# Patient Record
Sex: Female | Born: 1950 | Race: White | Hispanic: No | Marital: Married | Smoking: Former smoker
Health system: Southern US, Community
[De-identification: ages and names within clinical notes are randomized; demographics above are authoritative.]

## PROBLEM LIST (undated history)

## (undated) DIAGNOSIS — E119 Type 2 diabetes mellitus without complications: Secondary | ICD-10-CM

## (undated) DIAGNOSIS — M545 Low back pain, unspecified: Secondary | ICD-10-CM

## (undated) DIAGNOSIS — M1990 Unspecified osteoarthritis, unspecified site: Secondary | ICD-10-CM

## (undated) DIAGNOSIS — I639 Cerebral infarction, unspecified: Secondary | ICD-10-CM

## (undated) DIAGNOSIS — K219 Gastro-esophageal reflux disease without esophagitis: Secondary | ICD-10-CM

## (undated) DIAGNOSIS — F419 Anxiety disorder, unspecified: Secondary | ICD-10-CM

## (undated) DIAGNOSIS — Z9981 Dependence on supplemental oxygen: Secondary | ICD-10-CM

## (undated) DIAGNOSIS — I509 Heart failure, unspecified: Secondary | ICD-10-CM

## (undated) DIAGNOSIS — E785 Hyperlipidemia, unspecified: Secondary | ICD-10-CM

## (undated) DIAGNOSIS — I739 Peripheral vascular disease, unspecified: Secondary | ICD-10-CM

## (undated) DIAGNOSIS — G4730 Sleep apnea, unspecified: Secondary | ICD-10-CM

## (undated) DIAGNOSIS — D649 Anemia, unspecified: Secondary | ICD-10-CM

## (undated) DIAGNOSIS — J449 Chronic obstructive pulmonary disease, unspecified: Secondary | ICD-10-CM

## (undated) DIAGNOSIS — I10 Essential (primary) hypertension: Secondary | ICD-10-CM

## (undated) DIAGNOSIS — R0902 Hypoxemia: Secondary | ICD-10-CM

## (undated) DIAGNOSIS — Z8679 Personal history of other diseases of the circulatory system: Secondary | ICD-10-CM

## (undated) DIAGNOSIS — J9690 Respiratory failure, unspecified, unspecified whether with hypoxia or hypercapnia: Secondary | ICD-10-CM

## (undated) DIAGNOSIS — B029 Zoster without complications: Secondary | ICD-10-CM

## (undated) DIAGNOSIS — G8929 Other chronic pain: Secondary | ICD-10-CM

## (undated) HISTORY — PX: BACK SURGERY: SHX140

## (undated) HISTORY — DX: Zoster without complications: B02.9

## (undated) HISTORY — PX: LAPAROSCOPIC CHOLECYSTECTOMY: SUR755

## (undated) HISTORY — PX: FOOT SURGERY: SHX648

## (undated) HISTORY — PX: CARDIAC CATHETERIZATION: SHX172

## (undated) HISTORY — PX: POSTERIOR LUMBAR FUSION: SHX6036

---

## 1998-05-25 ENCOUNTER — Encounter: Payer: Self-pay | Admitting: Neurosurgery

## 1998-05-25 ENCOUNTER — Ambulatory Visit (HOSPITAL_COMMUNITY): Admission: RE | Admit: 1998-05-25 | Discharge: 1998-05-25 | Payer: Self-pay | Admitting: Neurosurgery

## 1998-07-11 ENCOUNTER — Encounter: Admission: RE | Admit: 1998-07-11 | Discharge: 1998-10-09 | Payer: Self-pay | Admitting: Anesthesiology

## 1998-10-12 ENCOUNTER — Encounter: Admission: RE | Admit: 1998-10-12 | Discharge: 1999-01-10 | Payer: Self-pay | Admitting: Anesthesiology

## 1998-12-07 ENCOUNTER — Encounter: Admission: RE | Admit: 1998-12-07 | Discharge: 1999-03-07 | Payer: Self-pay | Admitting: Anesthesiology

## 1999-02-01 ENCOUNTER — Encounter: Admission: RE | Admit: 1999-02-01 | Discharge: 1999-04-16 | Payer: Self-pay | Admitting: Anesthesiology

## 1999-04-17 ENCOUNTER — Encounter: Admission: RE | Admit: 1999-04-17 | Discharge: 1999-07-16 | Payer: Self-pay | Admitting: Anesthesiology

## 1999-07-20 ENCOUNTER — Encounter: Admission: RE | Admit: 1999-07-20 | Discharge: 1999-10-10 | Payer: Self-pay | Admitting: Anesthesiology

## 1999-09-12 ENCOUNTER — Encounter: Payer: Self-pay | Admitting: Anesthesiology

## 1999-09-12 ENCOUNTER — Ambulatory Visit (HOSPITAL_COMMUNITY): Admission: RE | Admit: 1999-09-12 | Discharge: 1999-09-12 | Payer: Self-pay | Admitting: Anesthesiology

## 1999-10-10 ENCOUNTER — Encounter: Admission: RE | Admit: 1999-10-10 | Discharge: 1999-12-19 | Payer: Self-pay | Admitting: Anesthesiology

## 2000-01-11 ENCOUNTER — Encounter: Admission: RE | Admit: 2000-01-11 | Discharge: 2000-03-31 | Payer: Self-pay | Admitting: Anesthesiology

## 2000-04-04 ENCOUNTER — Encounter: Admission: RE | Admit: 2000-04-04 | Discharge: 2000-07-03 | Payer: Self-pay | Admitting: Anesthesiology

## 2000-07-07 ENCOUNTER — Encounter: Admission: RE | Admit: 2000-07-07 | Discharge: 2000-10-05 | Payer: Self-pay | Admitting: Anesthesiology

## 2000-10-10 ENCOUNTER — Encounter: Admission: RE | Admit: 2000-10-10 | Discharge: 2001-01-08 | Payer: Self-pay | Admitting: Anesthesiology

## 2001-01-15 ENCOUNTER — Encounter: Payer: Self-pay | Admitting: Internal Medicine

## 2001-01-15 ENCOUNTER — Emergency Department (HOSPITAL_COMMUNITY): Admission: EM | Admit: 2001-01-15 | Discharge: 2001-01-15 | Payer: Self-pay | Admitting: Internal Medicine

## 2001-01-16 ENCOUNTER — Emergency Department (HOSPITAL_COMMUNITY): Admission: EM | Admit: 2001-01-16 | Discharge: 2001-01-17 | Payer: Self-pay | Admitting: *Deleted

## 2001-02-04 ENCOUNTER — Encounter: Admission: RE | Admit: 2001-02-04 | Discharge: 2001-05-05 | Payer: Self-pay | Admitting: Anesthesiology

## 2001-06-15 ENCOUNTER — Encounter: Payer: Self-pay | Admitting: Internal Medicine

## 2001-06-15 ENCOUNTER — Ambulatory Visit (HOSPITAL_COMMUNITY): Admission: RE | Admit: 2001-06-15 | Discharge: 2001-06-15 | Payer: Self-pay | Admitting: Internal Medicine

## 2001-12-12 ENCOUNTER — Emergency Department (HOSPITAL_COMMUNITY): Admission: EM | Admit: 2001-12-12 | Discharge: 2001-12-12 | Payer: Self-pay | Admitting: Emergency Medicine

## 2002-03-09 ENCOUNTER — Emergency Department (HOSPITAL_COMMUNITY): Admission: EM | Admit: 2002-03-09 | Discharge: 2002-03-09 | Payer: Self-pay | Admitting: *Deleted

## 2002-04-11 ENCOUNTER — Inpatient Hospital Stay (HOSPITAL_COMMUNITY): Admission: EM | Admit: 2002-04-11 | Discharge: 2002-04-12 | Payer: Self-pay | Admitting: Internal Medicine

## 2002-04-11 ENCOUNTER — Encounter: Payer: Self-pay | Admitting: Internal Medicine

## 2002-08-04 ENCOUNTER — Emergency Department (HOSPITAL_COMMUNITY): Admission: EM | Admit: 2002-08-04 | Discharge: 2002-08-04 | Payer: Self-pay | Admitting: Emergency Medicine

## 2002-08-24 ENCOUNTER — Ambulatory Visit (HOSPITAL_COMMUNITY): Admission: RE | Admit: 2002-08-24 | Discharge: 2002-08-24 | Payer: Self-pay | Admitting: Family Medicine

## 2002-08-24 ENCOUNTER — Encounter: Payer: Self-pay | Admitting: Family Medicine

## 2002-09-17 ENCOUNTER — Emergency Department (HOSPITAL_COMMUNITY): Admission: EM | Admit: 2002-09-17 | Discharge: 2002-09-17 | Payer: Self-pay | Admitting: Emergency Medicine

## 2002-09-17 ENCOUNTER — Encounter: Payer: Self-pay | Admitting: Emergency Medicine

## 2002-09-19 ENCOUNTER — Encounter: Payer: Self-pay | Admitting: *Deleted

## 2002-09-20 ENCOUNTER — Inpatient Hospital Stay (HOSPITAL_COMMUNITY): Admission: EM | Admit: 2002-09-20 | Discharge: 2002-09-24 | Payer: Self-pay | Admitting: *Deleted

## 2002-12-29 ENCOUNTER — Emergency Department (HOSPITAL_COMMUNITY): Admission: EM | Admit: 2002-12-29 | Discharge: 2002-12-29 | Payer: Self-pay | Admitting: Emergency Medicine

## 2003-01-07 ENCOUNTER — Ambulatory Visit (HOSPITAL_COMMUNITY): Admission: RE | Admit: 2003-01-07 | Discharge: 2003-01-07 | Payer: Self-pay | Admitting: Internal Medicine

## 2003-01-07 ENCOUNTER — Encounter: Payer: Self-pay | Admitting: Internal Medicine

## 2003-01-27 ENCOUNTER — Emergency Department (HOSPITAL_COMMUNITY): Admission: EM | Admit: 2003-01-27 | Discharge: 2003-01-27 | Payer: Self-pay | Admitting: Emergency Medicine

## 2003-04-04 ENCOUNTER — Inpatient Hospital Stay (HOSPITAL_COMMUNITY): Admission: EM | Admit: 2003-04-04 | Discharge: 2003-04-06 | Payer: Self-pay | Admitting: *Deleted

## 2003-04-04 ENCOUNTER — Encounter: Payer: Self-pay | Admitting: *Deleted

## 2003-05-06 ENCOUNTER — Ambulatory Visit (HOSPITAL_COMMUNITY): Admission: RE | Admit: 2003-05-06 | Discharge: 2003-05-06 | Payer: Self-pay | Admitting: Family Medicine

## 2003-05-06 ENCOUNTER — Encounter: Payer: Self-pay | Admitting: Family Medicine

## 2003-08-17 ENCOUNTER — Ambulatory Visit (HOSPITAL_COMMUNITY): Admission: RE | Admit: 2003-08-17 | Discharge: 2003-08-17 | Payer: Self-pay | Admitting: Internal Medicine

## 2003-08-22 ENCOUNTER — Ambulatory Visit (HOSPITAL_COMMUNITY): Admission: RE | Admit: 2003-08-22 | Discharge: 2003-08-22 | Payer: Self-pay | Admitting: Family Medicine

## 2003-10-04 ENCOUNTER — Ambulatory Visit (HOSPITAL_COMMUNITY): Admission: RE | Admit: 2003-10-04 | Discharge: 2003-10-04 | Payer: Self-pay | Admitting: Internal Medicine

## 2003-10-07 ENCOUNTER — Emergency Department (HOSPITAL_COMMUNITY): Admission: EM | Admit: 2003-10-07 | Discharge: 2003-10-07 | Payer: Self-pay | Admitting: Emergency Medicine

## 2003-11-01 ENCOUNTER — Ambulatory Visit (HOSPITAL_COMMUNITY): Admission: RE | Admit: 2003-11-01 | Discharge: 2003-11-01 | Payer: Self-pay | Admitting: Internal Medicine

## 2003-11-16 ENCOUNTER — Emergency Department (HOSPITAL_COMMUNITY): Admission: EM | Admit: 2003-11-16 | Discharge: 2003-11-16 | Payer: Self-pay | Admitting: Emergency Medicine

## 2003-12-04 ENCOUNTER — Emergency Department (HOSPITAL_COMMUNITY): Admission: EM | Admit: 2003-12-04 | Discharge: 2003-12-04 | Payer: Self-pay | Admitting: Emergency Medicine

## 2004-02-20 ENCOUNTER — Emergency Department (HOSPITAL_COMMUNITY): Admission: EM | Admit: 2004-02-20 | Discharge: 2004-02-20 | Payer: Self-pay | Admitting: Emergency Medicine

## 2004-03-28 ENCOUNTER — Emergency Department (HOSPITAL_COMMUNITY): Admission: EM | Admit: 2004-03-28 | Discharge: 2004-03-29 | Payer: Self-pay | Admitting: Emergency Medicine

## 2004-03-30 ENCOUNTER — Ambulatory Visit (HOSPITAL_COMMUNITY): Admission: RE | Admit: 2004-03-30 | Discharge: 2004-03-30 | Payer: Self-pay | Admitting: Family Medicine

## 2004-05-08 ENCOUNTER — Inpatient Hospital Stay (HOSPITAL_COMMUNITY): Admission: AD | Admit: 2004-05-08 | Discharge: 2004-05-10 | Payer: Self-pay | Admitting: Internal Medicine

## 2004-10-10 ENCOUNTER — Ambulatory Visit (HOSPITAL_COMMUNITY): Admission: RE | Admit: 2004-10-10 | Discharge: 2004-10-10 | Payer: Self-pay | Admitting: General Surgery

## 2005-01-20 ENCOUNTER — Emergency Department (HOSPITAL_COMMUNITY): Admission: EM | Admit: 2005-01-20 | Discharge: 2005-01-20 | Payer: Self-pay | Admitting: Emergency Medicine

## 2005-02-12 ENCOUNTER — Emergency Department (HOSPITAL_COMMUNITY): Admission: EM | Admit: 2005-02-12 | Discharge: 2005-02-12 | Payer: Self-pay | Admitting: Emergency Medicine

## 2005-07-05 ENCOUNTER — Ambulatory Visit (HOSPITAL_COMMUNITY): Admission: RE | Admit: 2005-07-05 | Discharge: 2005-07-05 | Payer: Self-pay | Admitting: General Surgery

## 2005-08-19 ENCOUNTER — Emergency Department (HOSPITAL_COMMUNITY): Admission: EM | Admit: 2005-08-19 | Discharge: 2005-08-19 | Payer: Self-pay | Admitting: Emergency Medicine

## 2005-12-18 ENCOUNTER — Ambulatory Visit (HOSPITAL_COMMUNITY): Admission: RE | Admit: 2005-12-18 | Discharge: 2005-12-18 | Payer: Self-pay | Admitting: Family Medicine

## 2006-01-08 ENCOUNTER — Ambulatory Visit: Payer: Self-pay | Admitting: Orthopedic Surgery

## 2006-01-13 ENCOUNTER — Ambulatory Visit (HOSPITAL_COMMUNITY): Admission: RE | Admit: 2006-01-13 | Discharge: 2006-01-13 | Payer: Self-pay | Admitting: Orthopedic Surgery

## 2006-01-20 ENCOUNTER — Ambulatory Visit: Payer: Self-pay | Admitting: Orthopedic Surgery

## 2006-01-22 ENCOUNTER — Encounter (HOSPITAL_COMMUNITY): Admission: RE | Admit: 2006-01-22 | Discharge: 2006-02-21 | Payer: Self-pay | Admitting: Orthopedic Surgery

## 2006-02-05 ENCOUNTER — Ambulatory Visit: Payer: Self-pay | Admitting: Orthopedic Surgery

## 2006-02-14 HISTORY — PX: SHOULDER OPEN ROTATOR CUFF REPAIR: SHX2407

## 2006-03-04 ENCOUNTER — Ambulatory Visit (HOSPITAL_COMMUNITY): Admission: RE | Admit: 2006-03-04 | Discharge: 2006-03-04 | Payer: Self-pay | Admitting: Orthopedic Surgery

## 2006-03-04 ENCOUNTER — Encounter (INDEPENDENT_AMBULATORY_CARE_PROVIDER_SITE_OTHER): Payer: Self-pay | Admitting: *Deleted

## 2006-03-04 ENCOUNTER — Ambulatory Visit: Payer: Self-pay | Admitting: Orthopedic Surgery

## 2006-03-07 ENCOUNTER — Encounter (HOSPITAL_COMMUNITY): Admission: RE | Admit: 2006-03-07 | Discharge: 2006-04-06 | Payer: Self-pay | Admitting: Orthopedic Surgery

## 2006-03-10 ENCOUNTER — Ambulatory Visit: Payer: Self-pay | Admitting: Orthopedic Surgery

## 2006-03-17 ENCOUNTER — Ambulatory Visit: Payer: Self-pay | Admitting: Orthopedic Surgery

## 2006-04-07 ENCOUNTER — Encounter (HOSPITAL_COMMUNITY): Admission: RE | Admit: 2006-04-07 | Discharge: 2006-05-07 | Payer: Self-pay | Admitting: Orthopedic Surgery

## 2006-04-10 ENCOUNTER — Ambulatory Visit: Payer: Self-pay | Admitting: Orthopedic Surgery

## 2006-05-21 ENCOUNTER — Ambulatory Visit: Payer: Self-pay | Admitting: Orthopedic Surgery

## 2006-06-18 ENCOUNTER — Ambulatory Visit: Payer: Self-pay | Admitting: Orthopedic Surgery

## 2006-12-08 ENCOUNTER — Emergency Department (HOSPITAL_COMMUNITY): Admission: EM | Admit: 2006-12-08 | Discharge: 2006-12-08 | Payer: Self-pay | Admitting: Emergency Medicine

## 2006-12-09 ENCOUNTER — Emergency Department (HOSPITAL_COMMUNITY): Admission: EM | Admit: 2006-12-09 | Discharge: 2006-12-09 | Payer: Self-pay | Admitting: Emergency Medicine

## 2007-01-22 ENCOUNTER — Ambulatory Visit (HOSPITAL_COMMUNITY): Admission: RE | Admit: 2007-01-22 | Discharge: 2007-01-22 | Payer: Self-pay | Admitting: Family Medicine

## 2007-03-14 ENCOUNTER — Emergency Department (HOSPITAL_COMMUNITY): Admission: EM | Admit: 2007-03-14 | Discharge: 2007-03-14 | Payer: Self-pay | Admitting: Emergency Medicine

## 2007-07-21 ENCOUNTER — Ambulatory Visit (HOSPITAL_COMMUNITY): Admission: RE | Admit: 2007-07-21 | Discharge: 2007-07-21 | Payer: Self-pay | Admitting: Family Medicine

## 2007-07-25 ENCOUNTER — Emergency Department (HOSPITAL_COMMUNITY): Admission: EM | Admit: 2007-07-25 | Discharge: 2007-07-26 | Payer: Self-pay | Admitting: Emergency Medicine

## 2008-02-16 ENCOUNTER — Ambulatory Visit (HOSPITAL_COMMUNITY): Admission: RE | Admit: 2008-02-16 | Discharge: 2008-02-16 | Payer: Self-pay | Admitting: Family Medicine

## 2008-03-16 HISTORY — PX: CAROTID ENDARTERECTOMY: SUR193

## 2008-03-24 ENCOUNTER — Emergency Department (HOSPITAL_COMMUNITY): Admission: EM | Admit: 2008-03-24 | Discharge: 2008-03-24 | Payer: Self-pay | Admitting: Emergency Medicine

## 2008-04-11 ENCOUNTER — Encounter: Payer: Self-pay | Admitting: Emergency Medicine

## 2008-04-12 ENCOUNTER — Encounter (INDEPENDENT_AMBULATORY_CARE_PROVIDER_SITE_OTHER): Payer: Self-pay | Admitting: Neurology

## 2008-04-12 ENCOUNTER — Inpatient Hospital Stay (HOSPITAL_COMMUNITY): Admission: EM | Admit: 2008-04-12 | Discharge: 2008-04-16 | Payer: Self-pay | Admitting: Neurology

## 2008-04-12 ENCOUNTER — Ambulatory Visit: Payer: Self-pay | Admitting: Internal Medicine

## 2008-04-13 ENCOUNTER — Encounter (INDEPENDENT_AMBULATORY_CARE_PROVIDER_SITE_OTHER): Payer: Self-pay | Admitting: Neurology

## 2008-04-13 ENCOUNTER — Ambulatory Visit: Payer: Self-pay | Admitting: Vascular Surgery

## 2008-04-15 ENCOUNTER — Encounter: Payer: Self-pay | Admitting: Vascular Surgery

## 2008-04-26 ENCOUNTER — Ambulatory Visit: Payer: Self-pay | Admitting: Vascular Surgery

## 2008-05-29 ENCOUNTER — Emergency Department (HOSPITAL_COMMUNITY): Admission: EM | Admit: 2008-05-29 | Discharge: 2008-05-30 | Payer: Self-pay | Admitting: Emergency Medicine

## 2008-09-16 HISTORY — PX: CAROTID ENDARTERECTOMY: SUR193

## 2008-10-25 ENCOUNTER — Ambulatory Visit: Payer: Self-pay | Admitting: Vascular Surgery

## 2008-11-11 ENCOUNTER — Ambulatory Visit: Payer: Self-pay | Admitting: Vascular Surgery

## 2008-11-11 ENCOUNTER — Encounter: Payer: Self-pay | Admitting: Vascular Surgery

## 2008-11-11 ENCOUNTER — Inpatient Hospital Stay (HOSPITAL_COMMUNITY): Admission: RE | Admit: 2008-11-11 | Discharge: 2008-11-12 | Payer: Self-pay | Admitting: Vascular Surgery

## 2008-11-29 ENCOUNTER — Ambulatory Visit: Payer: Self-pay | Admitting: Vascular Surgery

## 2009-03-30 ENCOUNTER — Ambulatory Visit (HOSPITAL_COMMUNITY): Admission: RE | Admit: 2009-03-30 | Discharge: 2009-03-30 | Payer: Self-pay | Admitting: Family Medicine

## 2010-01-08 IMAGING — CR DG LUMBAR SPINE COMPLETE 4+V
5 series · 5 of 5 positions shown · non-contrast
Comparison: MRI 07/21/2007

CLINICAL DATA: Fall, back pain

LUMBAR SPINE - COMPLETE 4+ VIEW

[view not recorded (1 of 5)]
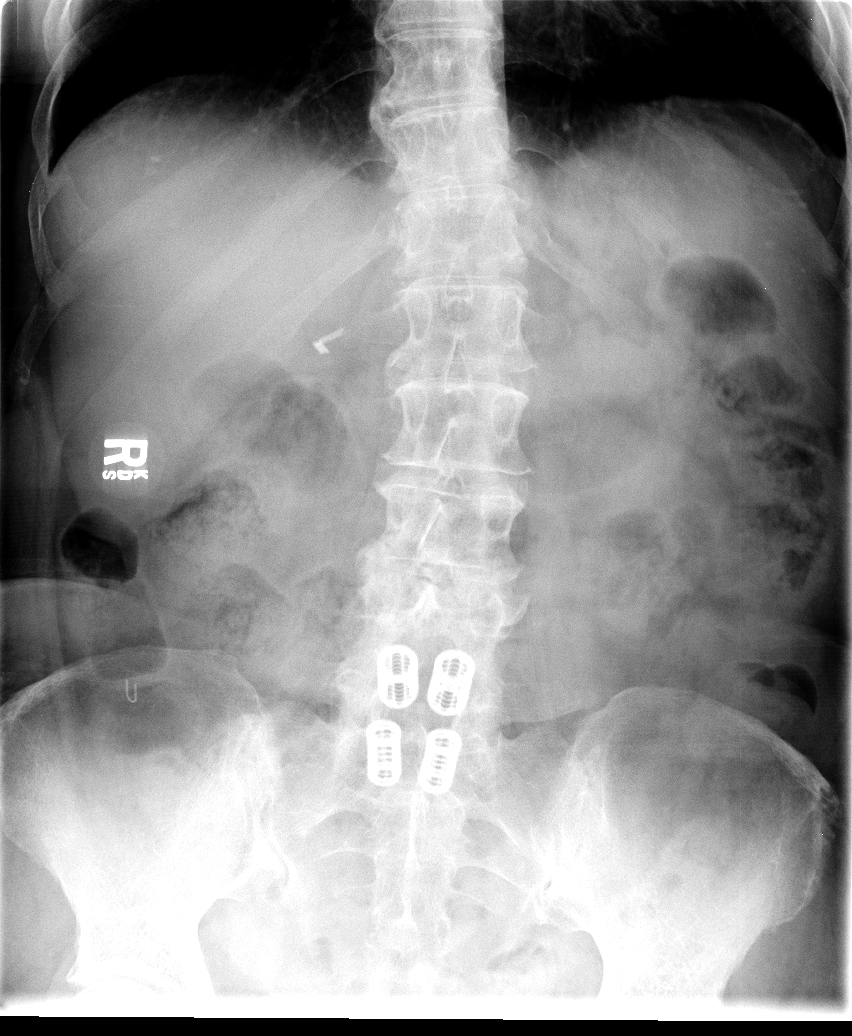

[view not recorded (2 of 5)]
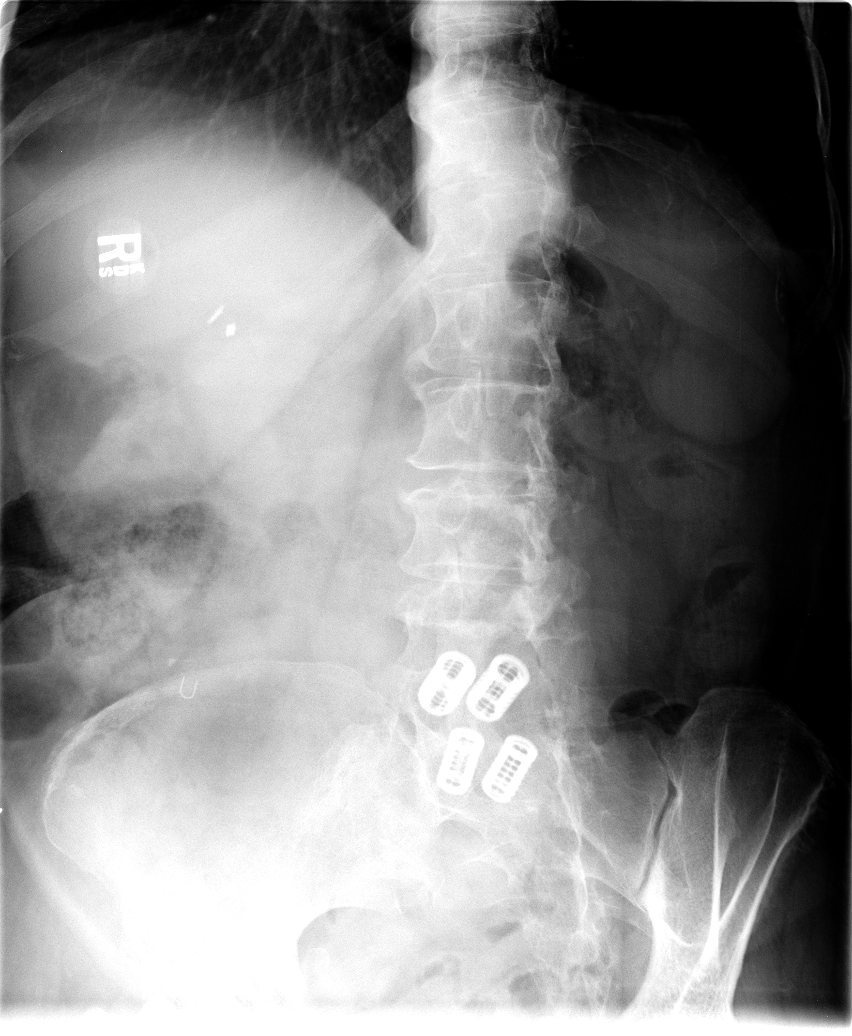

[view not recorded (3 of 5)]
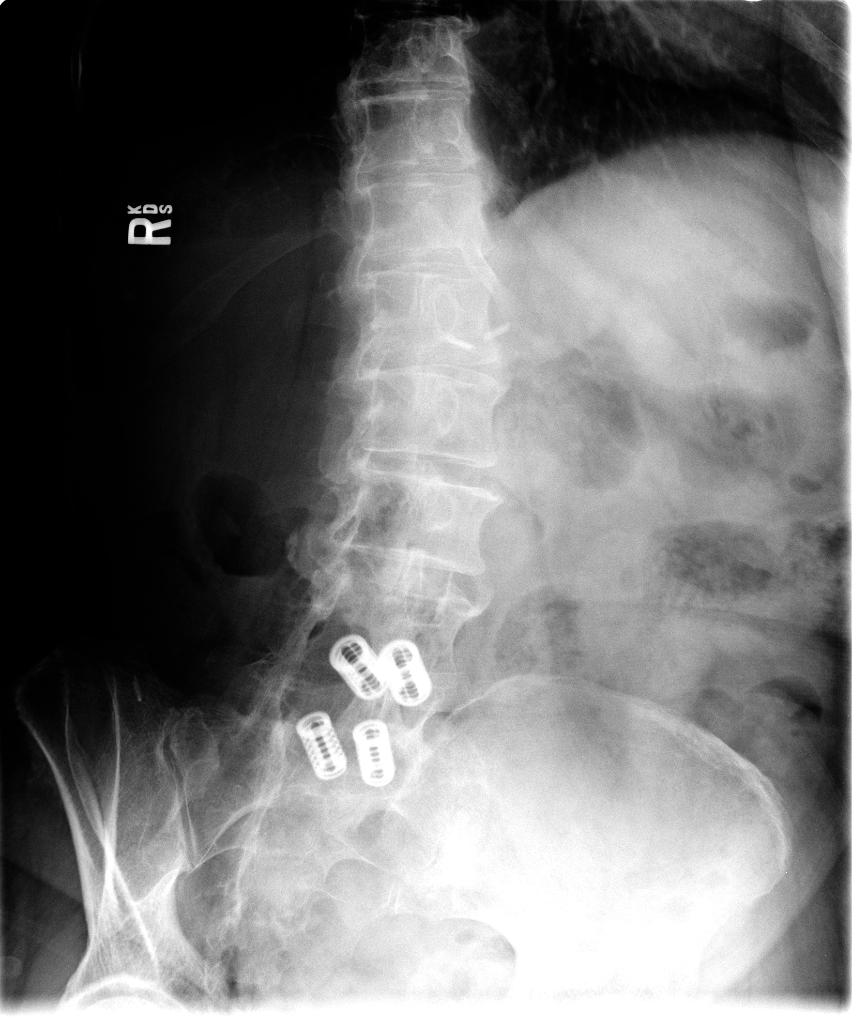

[view not recorded (4 of 5)]
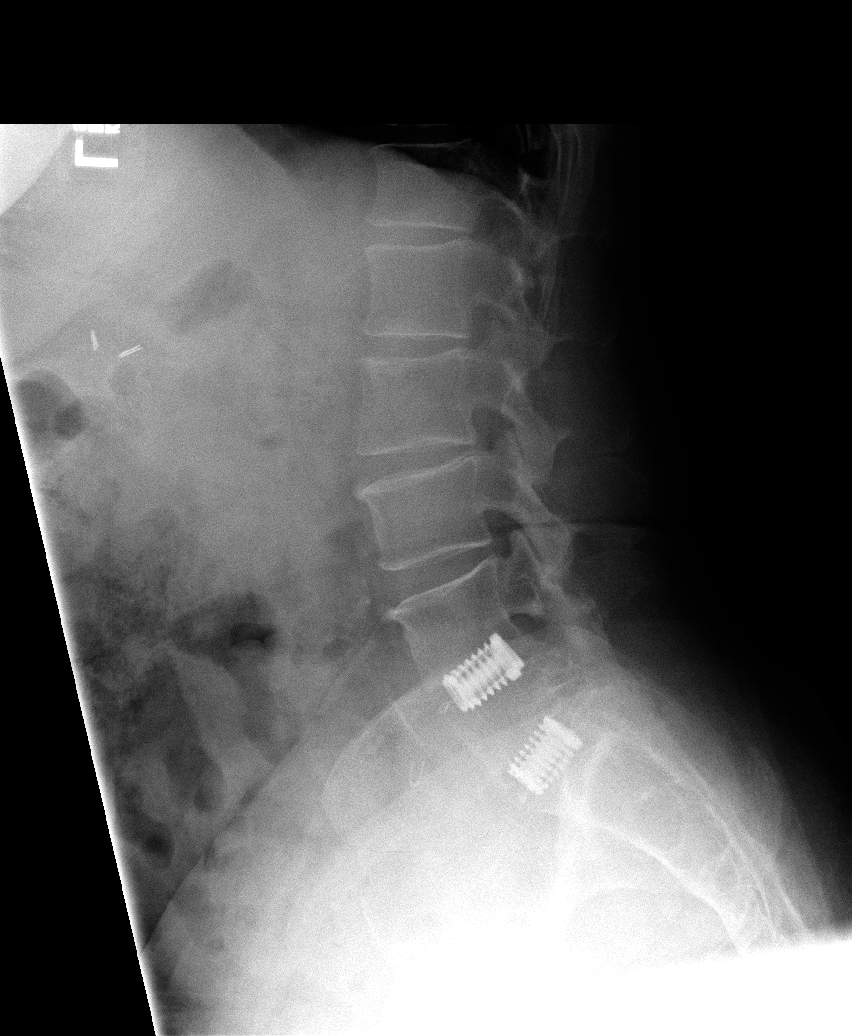

[view not recorded (5 of 5)]
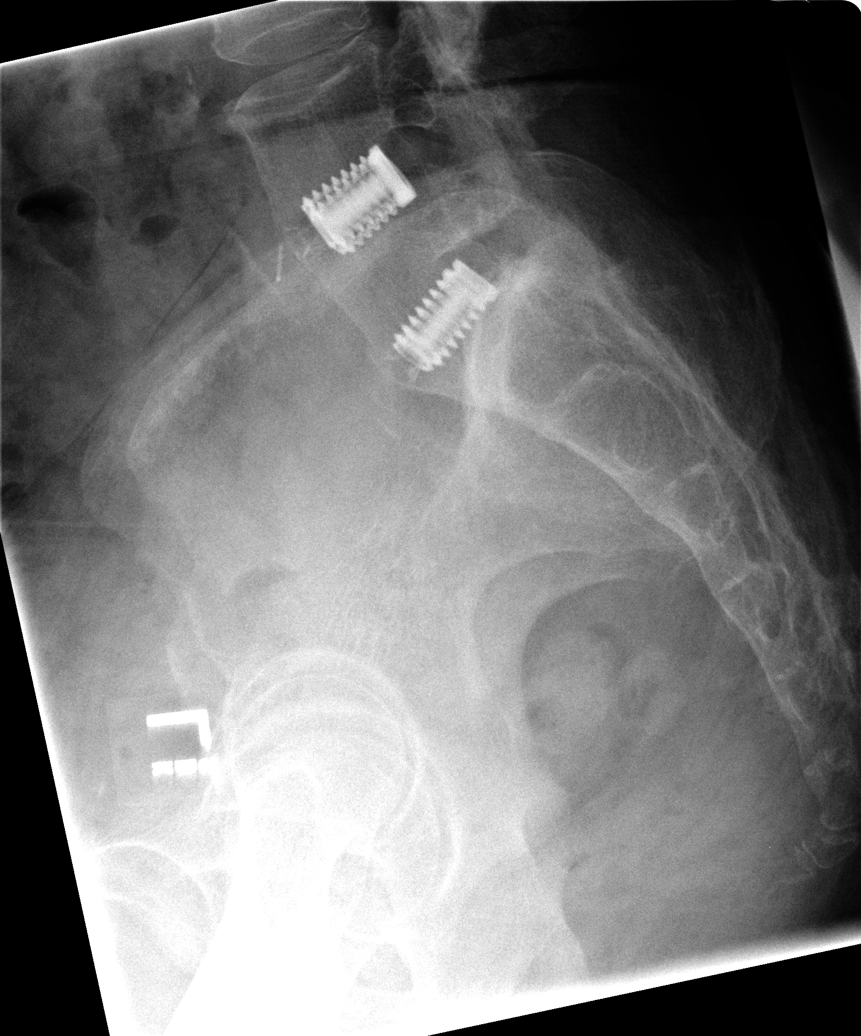

[5 of 5 positions shown; findings below may reference images not displayed]

FINDINGS: The patient is status post discectomy at L4-5 and L5, S1.
There are five lumbar-type vertebral bodies.  Minimal leftward
scoliosis.  Mild degenerative changes.  No fracture or acute
malalignment.
IMPRESSION: Prior to old discectomy.

No acute findings.

## 2010-08-10 ENCOUNTER — Emergency Department (HOSPITAL_COMMUNITY): Admission: EM | Admit: 2010-08-10 | Discharge: 2010-08-10 | Payer: Self-pay | Admitting: Emergency Medicine

## 2010-09-03 ENCOUNTER — Ambulatory Visit (HOSPITAL_COMMUNITY)
Admission: RE | Admit: 2010-09-03 | Discharge: 2010-09-03 | Payer: Self-pay | Source: Home / Self Care | Attending: Family Medicine | Admitting: Family Medicine

## 2010-10-06 ENCOUNTER — Encounter: Payer: Self-pay | Admitting: Internal Medicine

## 2010-11-27 LAB — BASIC METABOLIC PANEL
CO2: 31 mEq/L (ref 19–32)
Chloride: 96 mEq/L (ref 96–112)
Creatinine, Ser: 0.85 mg/dL (ref 0.4–1.2)
GFR calc Af Amer: >60 mL/min (ref >60)
Potassium: 4.2 mEq/L (ref 3.5–5.1)
Sodium: 133 mEq/L — ABNORMAL LOW (ref 135–145)

## 2010-11-27 LAB — CBC
HCT: 48.3 % — ABNORMAL HIGH (ref 36.0–46.0)
Hemoglobin: 17.1 g/dL — ABNORMAL HIGH (ref 12.0–15.0)
MCH: 31.9 pg (ref 26.0–34.0)
MCV: 90.1 fL (ref 78.0–100.0)
RBC: 5.36 MIL/uL — ABNORMAL HIGH (ref 3.87–5.11)

## 2010-11-27 LAB — POCT CARDIAC MARKERS: Troponin i, poc: <0.05 ng/mL (ref 0.00–0.09)

## 2010-11-27 LAB — DIFFERENTIAL
Eosinophils Absolute: 0.1 10*3/uL (ref 0.0–0.7)
Eosinophils Relative: 1 % (ref 0–5)
Lymphs Abs: 2.1 10*3/uL (ref 0.7–4.0)
Monocytes Absolute: 0.7 10*3/uL (ref 0.1–1.0)
Monocytes Relative: 7 % (ref 3–12)

## 2011-01-01 LAB — BASIC METABOLIC PANEL
BUN: 5 mg/dL — ABNORMAL LOW (ref 6–23)
CO2: 30 mEq/L (ref 19–32)
Calcium: 8.5 mg/dL (ref 8.4–10.5)
Glucose, Bld: 166 mg/dL — ABNORMAL HIGH (ref 70–99)
Potassium: 3.6 mEq/L (ref 3.5–5.1)
Sodium: 130 mEq/L — ABNORMAL LOW (ref 135–145)

## 2011-01-01 LAB — URINALYSIS, ROUTINE W REFLEX MICROSCOPIC
Bilirubin Urine: NEGATIVE
Hgb urine dipstick: NEGATIVE
Ketones, ur: NEGATIVE mg/dL
Nitrite: NEGATIVE
Specific Gravity, Urine: 1.016 (ref 1.005–1.030)
pH: 5.5 (ref 5.0–8.0)

## 2011-01-01 LAB — CBC
HCT: 40.7 % (ref 36.0–46.0)
HCT: 46 % (ref 36.0–46.0)
Hemoglobin: 14.1 g/dL (ref 12.0–15.0)
MCHC: 34.7 g/dL (ref 30.0–36.0)
MCV: 88.6 fL (ref 78.0–100.0)
Platelets: 202 10*3/uL (ref 150–400)
RBC: 5.2 MIL/uL — ABNORMAL HIGH (ref 3.87–5.11)
RDW: 13.8 % (ref 11.5–15.5)
WBC: 8.7 10*3/uL (ref 4.0–10.5)

## 2011-01-01 LAB — CROSSMATCH: Antibody Screen: NEGATIVE

## 2011-01-01 LAB — COMPREHENSIVE METABOLIC PANEL
AST: 19 U/L (ref 0–37)
Alkaline Phosphatase: 75 U/L (ref 39–117)
BUN: 9 mg/dL (ref 6–23)
CO2: 30 mEq/L (ref 19–32)
Chloride: 97 mEq/L (ref 96–112)
Creatinine, Ser: 0.93 mg/dL (ref 0.4–1.2)
GFR calc non Af Amer: >60 mL/min (ref >60)
Potassium: 4.3 mEq/L (ref 3.5–5.1)
Total Bilirubin: 0.5 mg/dL (ref 0.3–1.2)

## 2011-01-01 LAB — GLUCOSE, CAPILLARY
Glucose-Capillary: 203 mg/dL — ABNORMAL HIGH (ref 70–99)
Glucose-Capillary: 207 mg/dL — ABNORMAL HIGH (ref 70–99)
Glucose-Capillary: 210 mg/dL — ABNORMAL HIGH (ref 70–99)
Glucose-Capillary: 217 mg/dL — ABNORMAL HIGH (ref 70–99)

## 2011-01-01 LAB — HEMOGLOBIN A1C: Mean Plasma Glucose: 171 mg/dL

## 2011-01-01 LAB — APTT: aPTT: 30 seconds (ref 24–37)

## 2011-01-29 NOTE — Assessment & Plan Note (Signed)
OFFICE VISIT   [name], [name]  DOB:  September 22, 1950                                       04/26/2008  CHART#:[id number]   I saw the patient in the office today for followup.  She had a left  carotid endarterectomy by Dr. Hart Rochester on 04/15/2008 for a symptomatic  left carotid stenosis.  She comes in for routine followup visit.  She  has had some mild headaches which have been stable.  She had no focal  weakness or paresthesias.   PHYSICAL EXAMINATION:  Blood pressure is 108/67, heart rate is 84.  Her  incision is healing nicely.  She has some mild swelling.  Neurologic  exam is nonfocal.   Overall I am pleased with her progress and will have her follow up with  Dr. Hart Rochester in 6 months with a followup carotid duplex scan.  She knows  to call sooner if her headaches do not resolve.  I have instructed her  that it is safe to take Tylenol for headaches as needed.   Di Kindle. Edilia Bo, M.D.  Electronically Signed   CSD/MEDQ  D:  04/26/2008  T:  04/27/2008  Job:  [id number]

## 2011-01-29 NOTE — H&P (Signed)
HISTORY AND PHYSICAL EXAMINATION   October 25, 2008   Re:  [name], DIMAANO                  DOB:  30-Dec-1950   CHIEF COMPLAINT:  Severe right internal carotid stenosis with possible  right brain TIAs.   HISTORY OF PRESENT ILLNESS:  This 60 year old female patient found to  have severe carotid occlusive disease, following a small left brain  stroke in 2009.  She underwent left carotid endarterectomy and at that  time had a moderate right internal carotid stenosis.  She had complete  recovery from her stroke fairly rapidly.  She has had a few episodes  where she describes a drawing of the left side of her mouth which  occurred over the last several months but no hemiparesis, aphasia,  amaurosis fugax or other discrete symptoms.  Repeat carotid duplex exam  in [facility] office on October 25, 2008 revealed significant progression of  disease with now an 85-90% right internal carotid stenosis and widely  patent left carotid endarterectomy site.  She is now scheduled for  elective surgery on the right side.   PAST MEDICAL HISTORY:  1. Diabetes mellitus insulin dependent.  2. Hypertension.  3. COPD.  4. History of mild coronary artery disease several years ago.  No      symptoms at this time.  5. A previous left brain CVA.   ALLERGIES:  CIPRO.   PAST SURGICAL HISTORY:  1. Cholecystectomy.  2. Lumbar laminectomy.  3. Left carotid endarterectomy.   MEDICATIONS:  1. Metformin 500 mg b.i.d.  2. Januvia 100 mg daily.  3. Lyrica 75 mg daily.  4. Diovan hydrochlorothiazide 160/12.5 one daily.  5. Alprazolam 0.25 mg one q.i.d.  6. Aspirin 81 mg daily.   REVIEW OF SYSTEMS:  Denies any chest pain, dyspnea on exertion, PND,  orthopnea or pulmonary problems at this time.  Does have a history of  home oxygen in the past but has not used it for 6 months.   PHYSICAL EXAM:  VITAL SIGNS:  Blood pressure is 123/70, heart rate 84,  respirations 14.  GENERAL:  She is a middle-aged  female in no apparent distress, alert and  oriented x3.  NECK:  Supple, 3+ carotid pulses palpable.  There is a harsh bruit on  the right side.  No bruit on the left.  NEUROLOGY:  Normal.  No palpable adenopathy in the neck.  She is  edentulous.  CHEST:  Clear to auscultation.  CARDIOVASCULAR:  Regular rhythm.  No murmurs.  ABDOMEN:  Soft, nontender with no masses.  EXTREMITIES:  She has 3+ femoral pulses bilaterally with well-perfused  lower extremities.   IMPRESSION:  1. Severe right internal carotid stenosis - asymptomatic.  2. Diabetes mellitus.  3. Hypertension  4. Chronic obstructive pulmonary disease.  5. History of left brain stroke.   PLAN:  To admit the patient on February 26 for elective right carotid  endarterectomy.  Risks and benefits have been thoroughly discussed with  the patient who is willing to proceed.   Quita Skye Hart Rochester, M.D.  Electronically Signed   JDL/MEDQ  D:  10/25/2008  T:  10/26/2008  Job:  [id number]

## 2011-01-29 NOTE — Op Note (Signed)
[name], [name] NO.:  [id number]   MEDICAL RECORD NO.:  [id number]          PATIENT TYPE:  INP   LOCATION:  3306                         FACILITY:  [facility]   PHYSICIAN:  Quita Skye. Hart Rochester, M.D.  DATE OF BIRTH:  22-Aug-1951   DATE OF PROCEDURE:  11/11/2008  DATE OF DISCHARGE:                               OPERATIVE REPORT   PREOPERATIVE DIAGNOSIS:  Severe right internal carotid stenosis with  history of right brain transient ischemic attack.   POSTOPERATIVE DIAGNOSIS:  Severe right internal carotid stenosis with  history of right brain transient ischemic attack.   OPERATIONS:  Right carotid endarterectomy with Dacron patch angioplasty.   SURGEON:  Quita Skye. Hart Rochester, MD   FIRST ASSISTANT:  Nurse.   ANESTHESIA:  General endotracheal.   BRIEF HISTORY:  This patient previously underwent left carotid  endarterectomy and has been followed for a moderate right internal  carotid stenosis.  This is now progressed to a severe degree (80-90%)  and the patient relates 1-2 episodes of perioral numbness on the left  side.  She is scheduled for right carotid endarterectomy.   PROCEDURE:  The patient was taken to the operating room and placed in  the supine position at which time satisfactory general endotracheal  anesthesia was administered.  Right neck was prepped with Betadine scrub  and solution and draped in a routine sterile manner.  Incision was made  along the anterior border of the sternocleidomastoid muscle and carried  down through the subcutaneous tissue and platysma using Bovie.  Common  facial vein and external jugular veins were ligated with 3-0 silk ties  and divided exposing the common internal and external carotid arteries.  Care was taken not to injure the vagus or hypoglossal nerves, both of  which were exposed.  There was calcified atherosclerotic plaque at the  carotid bifurcation extending up the internal carotid artery posteriorly  about 3-4 cm,  distal vessel appeared normal.  A #10 shunt was prepared  and the patient was heparinized.  Carotid vessels were occluded with  vascular clamps.  Longitudinal opening was made in the common carotid  with a 15 blade, extended up the internal carotid with Potts scissors to  a point distal to the disease.  Plaque was about 80-90% stenotic in  severity and slightly irregular.  Distal vessel appeared normal.  A #10  shunt was inserted without difficulty reestablishing flow in about 2  minutes.  Standard endarterectomy was then performed using elevator and  Potts scissors with an eversion endarterectomy of the external carotid.  Plaque feathered off distal internal carotid artery nicely, not  requiring any tacking sutures.  Lumen was thoroughly irrigated with  heparin saline.  All loose debris was carefully removed and arteriotomy  was closed with a patch using continuous 6-0 Prolene.  Prior to  completion of the closure, the shunt was removed after about 30 minutes  of shunt time.  Following antegrade and retrograde flushing, closure was  completed reestablishing flow initially up the external and internal  branch.  Carotid  was occluded for less than 2  minutes for removal of the shunt.  Protamine was then given to reverse the heparin.  Following adequate  hemostasis, wound was irrigated with saline, closed in layers with  Vicryl in subcuticular fashion.  Sterile dressing was applied.  The  patient was taken to the recovery in satisfactory condition.      Quita Skye Hart Rochester, M.D.  Electronically Signed     JDL/MEDQ  D:  11/11/2008  T:  11/11/2008  Job:  [id number]

## 2011-01-29 NOTE — Procedures (Signed)
CAROTID DUPLEX EXAM   INDICATION:  Follow up right carotid endarterectomy with right neck  swelling.   HISTORY:  Diabetes:  Yes.  Cardiac:  No.  Hypertension:  Yes.  Smoking:  Yes.  Previous Surgery:  Bilateral carotid endarterectomies.  CV History:  Amaurosis Fugax No, Paresthesias No, Hemiparesis No.                                       RIGHT             LEFT  Brachial systolic pressure:  Brachial Doppler waveforms:  Vertebral direction of flow:        Antegrade  DUPLEX VELOCITIES (cm/sec)  CCA peak systolic                   121  ECA peak systolic                   201  ICA peak systolic                   106  ICA end diastolic                   20  PLAQUE MORPHOLOGY:                  None  PLAQUE AMOUNT:                      None  PLAQUE LOCATION:                    ECA   IMPRESSION:  1. Limited study.  2. Normal carotid duplex noted in the right carotid in respect to flow      and velocity and also no evidence of fluid collection noted.  3. Antegrade right vertebral arteries.   ___________________________________________  Quita Skye Hart Rochester, M.D.   MG/MEDQ  D:  11/29/2008  T:  11/30/2008  Job:  [id number]

## 2011-01-29 NOTE — H&P (Signed)
NAMESONDRA, BLIXT NO.:  [id number]   MEDICAL RECORD NO.:  [id number]          PATIENT TYPE:  INP   LOCATION:  3023                         FACILITY:  [facility]   PHYSICIAN:  Levert Feinstein, MD          DATE OF BIRTH:  Dec 05, 1950   DATE OF ADMISSION:  04/12/2008  DATE OF DISCHARGE:                              HISTORY & PHYSICAL   CHIEF COMPLAINT:  Right facial and hand numbness and weakness.   HISTORY OF PRESENT ILLNESS:  The patient is a 60 year old female, with  past medical history of depression/anxiety, hypertension, and a smoker,  recent 2 months, she had intermittent right facial jaw and hand  numbness, sometimes associated with typical migraines.  Yesterday around  6:00 p.m., she began to experience headaches again, she describes  bilateral frontal, later left retro-orbital severe pounding headache  with associated nausea, photophonophobia.  She noticed right upper and  lower lip and inner mouth numbness and tingling, and she also had right  hand numb, but this time it is spreading to right forearm.  She also has  right hand weakness, the whole episode lasted much longer about 6 hours,  which has prompted ER visit.  She originally presented to the [facility], the Primary Admission Medicine Team felt that the patient need  neurology evaluation, later transferred to [facility] for further  evaluation.   The patient reported the whole episode lasted about 6 hours.  Now, she  denies a headache and she still has mild right hand numbness, but no  longer weak and her right face is no longer numb.   REVIEW OF SYSTEMS:  She denies speech difficulty, chest pain, heart  palpitation, right trunk or lag involvement.  She did have hoarse voice  and this has been a chronic problem for her.   PAST MEDICAL HISTORY:  Hypertension, depression, and anxiety.   SURGICAL HISTORY:  Low back surgery, cholecystectomy, and right elbow  surgery.   FAMILY HISTORY:   Noncontributory.   SOCIAL HISTORY:  She is disabled because of chronic low back pain.  She  smokes a pack a day.  Denied drinks and has 3 children.   ALLERGIES:  CIPRO causes rash.   PHYSICAL EXAMINATION:  VITAL SIGNS:  Temperature 97.7, heart rate of 62,  respiration of 16, and blood pressure was 104/57.  CARDIAC:  Regular rate and rhythm.  PULMONARY:  Has expiration wheezes.  NECK:  Supple.  No carotid bruits.  NEUROLOGIC:  She has a hoarse voice, but alert and oriented to history  taking and casaul conversation. Cranial nerves II-XII.  Pupil equal,  round, reactive, right fundi were sharp, visual fields were full on  confrontational test, and facial sensation strength was normal.  Uvula,  tongue midline.  Head turning and shoulder shrugging were normal  symmetric.  Tongue protrusion into cheek strength was normal.  MOTOR EXAMINATION:  Normal tone, bulk, and strength including right  upper extremity.  Sensory was normal to light touch and pinprick.  There  was no extinction on double spontaneous stimulation.  Coordination,  normal finger-to-nose, heel-to-shin  GAIT:  Narrow-based and steady.  Deep tendon reflex.  Hypoactive and  symmetric.  Plantar responses were flat.   ASSESSMENT AND PLAN:  1. This is a 60 year old female with previous history of migraine,      vascular risk factors of hypertension, and smoker presenting with      headache with associated right facial and hand numb and weakness.      The most likely etiology is a complicated migraine, but with her      vascular risk factors the other possibility would be a left      thalamic/subcortical small vessel stroke, MRI of the brain without      contrast.  2. Start antiplatelet agent.  3. Complete basic stroke lab evaluation.      Levert Feinstein, MD  Electronically Signed     YY/MEDQ  D:  04/12/2008  T:  04/12/2008  Job:  [id number]

## 2011-01-29 NOTE — Procedures (Signed)
CAROTID DUPLEX EXAM   INDICATION:  Follow up left carotid endarterectomy.   HISTORY:  Diabetes:  Yes.  Cardiac:  No.  Hypertension:  Yes.  Smoking:  Yes.  Previous Surgery:  Left carotid endarterectomy on 04/15/2008.  CV History:  Headaches, history of mild left brain CVA in 03/2008.  Amaurosis Fugax No, Paresthesias No, Hemiparesis No.                                       RIGHT              LEFT  Brachial systolic pressure:         124                126  Brachial Doppler waveforms:         Normal             Normal  Vertebral direction of flow:        Antegrade/dampened Antegrade  DUPLEX VELOCITIES (cm/sec)  CCA peak systolic                   128                84  ECA peak systolic                   197                114  ICA peak systolic                   336                110  ICA end diastolic                   121                36  PLAQUE MORPHOLOGY:                  Mixed  PLAQUE AMOUNT:                      Moderate-to-severe None  PLAQUE LOCATION:                    ICA/ECA   IMPRESSION:  1. Doppler velocities suggest an 80-99% stenosis of the right proximal      internal carotid artery.  2. Patent left carotid endarterectomy site with no evidence of      stenosis noted in the left internal carotid artery.  3. The antegrade flow of the right vertebral artery appears dampened      and resistive, which may suggest a possible distal stenosis.   ___________________________________________  [name], M.D.   CH/MEDQ  D:  10/25/2008  T:  10/25/2008  Job:  [id number]

## 2011-01-29 NOTE — Op Note (Signed)
[name], [name] NO.:  [id number]   MEDICAL RECORD NO.:  [id number]          PATIENT TYPE:   LOCATION:                                 FACILITY:   PHYSICIAN:  Quita Skye. Hart Rochester, M.D.       DATE OF BIRTH:   DATE OF PROCEDURE:  DATE OF DISCHARGE:                               OPERATIVE REPORT   PREOPERATIVE DIAGNOSIS:  Severe left internal carotid stenosis status  post left brain cerebrovascular accident.   POSTOPERATIVE DIAGNOSIS:  Severe left internal carotid stenosis status  post left brain cerebrovascular accident.   OPERATION:  Left carotid endarterectomy with Dacron patch angioplasty.   SURGEON:  Quita Skye. Hart Rochester, MD.   FIRST ASSISTANT:  1. Durene Cal IV, MD   SECOND ASSISTANT:  Jerold Coombe, PA.   ANESTHESIA:  General endotracheal.   BRIEF HISTORY:  This middle-aged female patient has suffered multiple  episodes of transient weakness in the right upper extremity and right  face over the last 6 months and suffered a mild left brain CVA 4 days  ago with findings on MRI scan compatible with this.  She has regained  her neurologic function completely having had a short episode of  weakness in the right arm and right face.  She is now scheduled for  carotid endarterectomy for severe left internal carotid stenosis.  She  also has a moderate right internal carotid stenosis.   PROCEDURE:  The patient was taken to the operating room and placed in  the supine position at which time satisfactory general endotracheal  anesthesia was administered.  The left neck was prepped with Betadine  scrub and solution and draped in a routine sterile manner.  An incision  was made along the anterior border of sternocleidomastoid muscle and  carried down through subcutaneous tissue and platysma using the Bovie.  The common facial vein and external jugular veins were ligated with 3-0  silk ties and divided exposing the common, internal, and external  carotid  arteries.  Care was taken not to injure the vagus and  hypoglossal nerves both of which were exposed.  There was a calcified  atherosclerotic plaque at the carotid bifurcation extending up to the  internal carotid artery about 3-4 cm.  Distal vessel appeared normal.  There was also some thickening of the common carotid.  A #10 shunt was  prepared, and the patient was heparinized.  The carotid vessels were  occluded with vascular clamps.  Longitudinal opening made in the common  carotid with a 15-blade, extended up to the internal carotid with a  Potts scissors to a point distal to the disease.  The plaque was at  least 80% stenotic in severity with good backbleeding from above.  A #10  shunt was inserted without difficulty reestablishing flow in about 2  minutes.  Standard endarterectomy was then performed using the elevator  and Potts scissors with eversion endarterectomy of the external carotid.  The plaque feathered off the distal internal carotid artery nicely, not  requiring any tacking sutures.  The lumen was thoroughly irrigated with  heparin and saline, and all loose debris carefully removed.  Arteriotomy  was closed with a patch using continuous 6-0 Prolene.  Prior to  completion of closure, the shunt removed after about 30 minutes shunt  time.  Following antegrade and retrograde flushing, closure was  completed reestablishing flow initially up to the external and internal  branch.  Carotid was occluded for less than 2 minutes for  removal of the shunt.  Protamine was then given to reverse the heparin.  Following adequate hemostasis, wound was irrigated with saline and  closed in layers with Vicryl in subcuticular fashion.  Sterile dressing  applied.  The patient was taken to recovery room in satisfactory  condition.      Quita Skye Hart Rochester, M.D.  Electronically Signed     JDL/MEDQ  D:  04/15/2008  T:  04/16/2008  Job:  [id number]

## 2011-01-29 NOTE — Discharge Summary (Signed)
[name], [name] NO.:  [id number]   MEDICAL RECORD NO.:  [id number]          PATIENT TYPE:  INP   LOCATION:  3304                         FACILITY:  [facility]   PHYSICIAN:  Quita Skye. Hart Rochester, M.D.  DATE OF BIRTH:  [birth year]   DATE OF ADMISSION:  04/12/2008  DATE OF DISCHARGE:  04/16/2008                               DISCHARGE SUMMARY   A patient of Dr. Hart Rochester in Stroke Service.   DISCHARGE DIAGNOSES:  1. Left carotid occlusive disease.  2. Left hemispheric cerebrovascular accident with good resolution of      symptoms.  3. Hypertension.  4. Depression.  5. Anxiety.  6. Diabetes.   PROCEDURES PERFORMED:  Left carotid endarterectomy on April 15, 2008, by  Dr. Hart Rochester.   COMPLICATIONS:  None.   CONDITION AT DISCHARGE:  Stable and improved.   DISCHARGE MEDICATIONS:  1. Diovan 160/12.5 mg p.o. daily.  2. Januvia 100 mg p.o. daily.  3. Metformin 500 mg p.o. b.i.d.  4. Xanax 0.5 mg p.o. q.i.d. p.r.n. anxiety.  5. Tylox 5/500 p.o. t.i.d.  6. Lyrica 75 mg p.o. b.i.d.  7. Lexapro 10 mg p.o. daily.  She was given a prescription for Tylox,      a total of #30 were prescribed, for her postoperative pain.   DISPOSITION:  She has been discharged to home in stable condition with  her wounds healing well.  She was instructed to clean them gently with  soap and water.  She is to call if fever greater than 101, redness,  drainage from the incision, severe headache, or new motor, speech, or  visual changes.  She is to see Dr. Hart Rochester in 2-3 weeks in the office; we  will arrange the visit.  She is also to see the Stroke Team physician in  2 months.   BRIEF IDENTIFYING STATEMENT:  For complete details, please refer to the  typed history and physical.  Briefly, this 60 year old woman was  transferred in from [facility] following presentation there  with symptoms consistent with a left hemispheric stroke.  She had right  facial and hand numbness with  weakness.   HOSPITAL COURSE:  She was admitted to the Stroke Team.  Workup revealed  a left narrowing of her carotid artery.  Dr. Hart Rochester evaluated her and  recommended left carotid endarterectomy for stroke prevention.  She was  informed of the risks and benefits of the procedure, and after careful  consideration, elected to proceed with surgery.   On April 15, 2008, she was taken to the operating room and underwent the  aforementioned left carotid endarterectomy.  For complete details,  please refer to the typed operative report.  The procedure was without  complications.  She was returned to the postanesthesia care unit  extubated.  Following stabilization, she was transferred to a bed on the  surgical step-down unit.   On the following morning, she was desirous to have discharge.  Her  neurological exam was nonfocal.  She was discharged to home in stable  condition.      Wilmon Arms,  PA      Quita Skye. Hart Rochester, M.D.  Electronically Signed    KEL/MEDQ  D:  04/16/2008  T:  04/17/2008  Job:  [id number]   cc:   Quita Skye. Hart Rochester, M.D.

## 2011-01-29 NOTE — Discharge Summary (Signed)
[name], [name] NO.:  [id number]   MEDICAL RECORD NO.:  [id number]          PATIENT TYPE:  INP   LOCATION:  3306                         FACILITY:  [facility]   PHYSICIAN:  Quita Skye. Hart Rochester, M.D.  DATE OF BIRTH:  [birth year]   DATE OF ADMISSION:  11/11/2008  DATE OF DISCHARGE:  11/12/2008                               DISCHARGE SUMMARY   ADMISSION DIAGNOSIS:  Severe right internal carotid artery stenosis with  possible history of right brain transient ischemic attack.   DISCHARGE DIAGNOSES:  1. Severe right internal carotid artery stenosis with history of right      brain transient ischemic attack status post right carotid      endarterectomy.  2. Hypertension.  3. Chronic obstructive pulmonary disease.  4. Previous left brain cerebrovascular accident, status post left      carotid endarterectomy.  5. Diabetes mellitus, type 2.  6. Mild coronary artery disease.  7. Allergy to Cipro.  8. Cholecystectomy.  9. Lumbar laminectomy.  10.Alcohol and tobacco abuse.  11.History of stroke and sleep apnea.   PROCEDURE:  On November 11, 2008, right carotid endarterectomy with  diagonal branch angioplasty by Dr. Josephina Gip.   BRIEF HISTORY:  [name] is a 60 year old female found to have severe  carotid occlusive disease following a small left brain stroke in 2009.  She underwent left carotid endarterectomy at that time and was noted to  have moderate right internal carotid artery stenosis.  She completely  recovered from her stroke fairly rapidly.  She has had a few episodes  which she describes as drooling at the left side of her mouth which  occurred over the last several months but no hemiparesis, dysphagia,  amaurosis fugax, or other discrete symptoms.  Recent carotid duplex via  the esophagus on October 25, 2008, revealed significant progression of  the disease but now 85% to 90% right internal carotid artery stenosis  and a widely patent left carotid  endarterectomy site.  He recommended  right carotid artery endarterectomy.   HOSPITAL COURSE:  [name] was electively admitted to [facility] on November 11, 2008.  She underwent the previously-mentioned  procedure.  Postoperatively, she was extubated neurologically intact and  after a short stay in the recovery unit, was transferred to step-down  unit 3300 where she remained until discharge.  She had an uneventful  postoperative course.  Remained hemodynamically and neurologically  stable, was able to void, ambulate, and tolerate food postoperatively.  Ordered labs showed potassium 3.6, creatinine 0.8, and hematocrit of 40.  She denied dysphagia.  Her incision was healing well and showed no signs  of hematoma.  No complaints, was approved for discharge home on  postoperative day 1, November 12, 2008, in stable condition.   DISCHARGE MEDICATIONS:  1. Alprazolam 0.25 mg q.i.d.  2. Metformin 500 mg b.i.d.  3. Lyrica 75 mg b.i.d.  4. Diovan HCT 160/12.5 mg daily.  5. Januvia 100 mg daily.  6. Tylenol one tablet p.o. q. 4 h. p.r.n. pain.  7. Lantus insulin 24 units subcutaneously q.a.m.  DISCHARGE INSTRUCTIONS:  Continue diabetic-appropriate diet.  May  shower, clean the incision gently with soap and water.  See Dr. Hart Rochester  in 2-3 weeks.  She is to call sooner if she has fever greater than 101,  or if there is a drainage from the incision site, severe headache, or  any neurologic changes.      Jerold Coombe, P.A.      Quita Skye Hart Rochester, M.D.  Electronically Signed    AWZ/MEDQ  D:  11/18/2008  T:  11/19/2008  Job:  [id number]   cc:   Quita Skye. Hart Rochester, M.D.  [facility]

## 2011-01-29 NOTE — Consult Note (Signed)
[name], [name] NO.:  [id number]   MEDICAL RECORD NO.:  [id number]          PATIENT TYPE:  INP   LOCATION:  3023                         FACILITY:  [facility]   PHYSICIAN:  Quita Skye. Hart Rochester, M.D.  DATE OF BIRTH:  [birth year]   DATE OF CONSULTATION:  DATE OF DISCHARGE:                                 CONSULTATION   REFERRING PHYSICIAN:  Pramod P. Pearlean Brownie, MD   CHIEF COMPLAINT:  Recent left brain CVA with severe left carotid  occlusive disease.   HISTORY OF PRESENT ILLNESS:  This 60 year old female patient suffered a  mild left brain CVA 48 hours ago, which consisted of weakness in the  right upper extremity with some tingling and weakness in the right side  of her face.  She denies any lower extremity symptoms or aphasia or  visual disturbance.  She states that this lasted about 3 hours and then  resolved completely.  She feels that she is essentially back to normal  at this time.  She has had multiple TIAs over the past several months  lasting only a few minutes in duration.  She has no history of previous  stroke.  The MRI scan during this hospitalization revealed patchy area  of infarction in the left peri-Rolandic white and gray matter suggestive  of peripheral emboli.  Carotid duplex exam performed revealed a greater  than 80% left internal carotid stenosis and 60%-80% right internal  carotid stenosis.   PAST MEDICAL HISTORY:  1. Diabetes mellitus - insulin dependent.  2. Hypertension.  3. COPD.  4. History of mild coronary artery disease, several years ago, no      symptoms at this time.  5. Negative for previous stroke.   ALLERGIES:  To CIPRO.   PAST SURGICAL HISTORY:  1. Cholecystectomy.  2. Lumbar laminectomy.   MEDICATIONS:  Please see chart.   SOCIAL HISTORY:  Smokes a pack of cigarettes per day for 40+ years.  Negative for alcohol.  Denies any claudication.   REVIEW OF SYSTEMS:  Denies any claudication-type symptoms.  Does not  ambulate  much.  She does have mild dyspnea on exertion if ambulates at a  fast speed.  No chest pain, PND, or orthopnea.   PHYSICAL EXAMINATION:  VITAL SIGNS:  Blood pressure is 96/60, heart rate  is 60, and respirations 16.  GENERAL:  She is a middle-aged female in no apparent distress.  Alert  and oriented x3.  NECK:  Supple, 3+ carotid pulses palpable.  The bruit on the left side  is louder than the bruit on the right side.  NEUROLOGIC:  Essentially normal.  No palpable adenopathy in the neck.  Upper extremity pulses are 3+ bilaterally.  CHEST:  Clear to auscultation.  CARDIOVASCULAR:  Regular rate and rhythm with no murmurs.  ABDOMEN:  Soft, nontender with no masses.  EXTREMITIES:  3+ femoral, 2+ popliteal, and 2+ dorsalis pedis pulses  palpable bilaterally.   IMPRESSION:  1. Acute left brain patchy cerebrovascular accident by MRI scan with      essential and normal neurologic exam at this time.  2. Severe left carotid occlusive disease.  3. Hypertension.  4. Chronic obstructive pulmonary disease.  5. Diabetes mellitus.   PLAN:  To proceed with left carotid endarterectomy on April 15, 2008.  Risks and benefits have been thoroughly discussed with the patient and  she would like to proceed that during this hospitalization.      Quita Skye Hart Rochester, M.D.  Electronically Signed     JDL/MEDQ  D:  04/13/2008  T:  04/14/2008  Job:  [id number]

## 2011-01-29 NOTE — Assessment & Plan Note (Signed)
OFFICE VISIT   Debra, Glover  DOB:  [birth year]                                       11/29/2008  CHART#:[id number]   The patient returns for her initial postop check following a right  carotid endarterectomy done February 26 for severe right internal  carotid stenosis with a history of right brain TIAs.  She had suffered  some episodes of perioral numbness on the left side preoperatively, and  she has had no recurrence of those symptoms.  She is swallowing well and  has had no hoarseness, has had no neurologic symptoms since her surgery.  She does admit to more discomfort on the right side that she had on the  contralateral left side, and that surgery performed in July 2009.  She  saw 1 drop of some purulent material at the top of the incision and is  concerned about that.   On examination, blood pressure is 117/67, heart rate is 85, temperature  97.5, respirations 14.  Right neck incision has healed nicely.  There is  no erythema or fluctuance but there is some postoperative swelling, more  significant than on the left side.  Carotid pulses 3+ with no audible  bruit.  Her neurologic exam is normal.   Carotid duplex exam was performed.  There was no fluid collection seen,  and the carotid endarterectomy site is widely patent with no flow  reduction.   I have reassured her regarding these findings and she will return to see  me in 6 months for a followup carotid duplex exam unless she develops  any neurologic symptoms in the interim and will continue taking a daily  aspirin.   Quita Skye Hart Rochester, M.D.  Electronically Signed   JDL/MEDQ  D:  11/29/2008  T:  12/01/2008  Job:  [id number]

## 2011-02-01 NOTE — H&P (Signed)
[facility]  Patient:    [name], WELFORD                         MRN: [id number] Adm. Date:  [id number] Attending:  Thyra Breed CC:         Dr. Sherwood Gambler at [facility]   History and Physical  FOLLOW-UP EVALUATION:  Statzer has had an upper respiratory tract infection since her last evaluation and received two courses of antibiotics for this. She has been a little bit wiped out from that. Associated with her upper respiratory tract infection, she had an exacerbation of her diabetes mellitus and was treated by Dr. Sherwood Gambler for this. She notes that the Tylox is helping her significantly, and she is comfortable with the level of pain control she is getting from this. She takes approximately five tablets per day. She is not actively exercising at this time. She does note some constipation from this medication. She does not notice any numbness or tingling, bowel or bladder incontinence, or weakness. She is globally weak from her recent infections.  CURRENT MEDICATIONS: 1. Tylox. She is taking five tablets per day. 2. Xanax 3 per day. 3. Amaryl.  PHYSICAL EXAMINATION:  VITAL SIGNS:  Blood pressure 144/69, heart rate is 86, respiratory rate is 20, O2 saturation is 92%, pain level is 8/10, temperature is 97.2.  NEUROLOGICAL:  She exhibited inability to stand upright without a lot of discomfort. She stands slightly flexed forward. Straight leg raise signs are negative. Deep tendon reflexes symmetric in the lower extremities. Motor was 5/5.  IMPRESSION: 1. Low back pain predominantly on the basis of a facet joint syndrome    with underlying lumbar spondylosis. 2. Anxiety. 3. Depression. 4. Diabetes per Dr. Sherwood Gambler.  DISPOSITION: 1. Continue on Tylox 5/500 one to two p.o. q.6h. p.r.n., #150 with no refill    to last a month. 2. Xanax 1 mg one p.o. q.8h. p.r.n., #100 with two refills. 3. Follow up with me in 4 to 8 weeks. DD:  10/13/00 TD:  10/13/00 Job:  [id number]

## 2011-02-01 NOTE — Op Note (Signed)
[name], [name] NO.:  [id number]   MEDICAL RECORD NO.:  [id number]          PATIENT TYPE:  AMB   LOCATION:  DAY                           FACILITY:  [facility]   PHYSICIAN:  Vickki Hearing, M.D.DATE OF BIRTH:  [birth year]   DATE OF PROCEDURE:  03/04/2006  DATE OF DISCHARGE:                                 OPERATIVE REPORT   60 year old female with preoperative diagnosis of AC joint arthrosis and  tendinopathy of the right rotator cuff.  She was treated with physical  therapy, injections and oral medications and did not improve.  Eventually  got an MRI which showed no cuff tear but tendinopathy with primary AC joint  arthritis and a spur on the distal aspect of the acromion.  She presented  for surgery. Once she regained her motion, she had a touch of adhesive  capsulitis/stiffness of the right shoulder.   PREOPERATIVE DIAGNOSIS:  Acromioclavicular joint arthritis, rotator cuff  tendinopathy.   POSTOPERATIVE DIAGNOSIS:  Acromioclavicular joint arthrosis primary, rotator  cuff syndrome, partial tear rotator cuff.   OPERATIVE FINDINGS:  There was significant and severe bursitis in the  subacromial space.  There was a partial tear of the subscapularis and  supraspinatus tendon.  There was degenerative fraying of the anterior  glenoid labrum.  Glenohumeral surfaces looked normal.   There was a spur communicating between the acromioclavicular joint and type  2 acromion.   PROCEDURE:  Arthroscopic subacromial decompression, debridement of rotator  cuff, subscapularis, supraspinatus, open Mumford procedure.   SURGEON:  Vickki Hearing, M.D.   ANESTHETIC:  General.   Assisted by Chinita Pester RNFA   The patient was identified in the preop holding area as [name].  She  marked her right shoulder as the surgical site.  This was countersigned by  the surgeon.  History and physical was updated.  She was given a gram of  Ancef and taken to the  operating room where general anesthetic was provided.  After successful anesthesia she was placed in a modified beach-chair  position on Schlein positioner with appropriate padding and positioning of  the head, arm and legs.   After sterile prep and drape with DuraPrep time-out was taken and completed.   Posterior portal was established.  Diagnostic arthroscopy was performed.  Debridement was performed of the supraspinatus and subscapularis tendons.   Degenerative fraying of the labrum was also debrided.  The biceps anchor was  intact.  Axillary recess was normal.  Bare spot was also normal.   Scope was placed in the subacromial space where thickened and inflamed bursa  was encountered.  Two lateral portal were established.  Debridement of the  bursa was performed until the rotator cuff was easily visualized and room  with a view was obtained.   A motorized bur was used to reform anterior acromioplasty.   An open procedure was done for the distal clavicle.  A straight incision was  made over the [facility] joint.  Subcutaneous tissue was divided down to periosteum  which was stripped from the bone sharply.  Distal clavicle was  encountered  and saw was used to removed the lateral 7-8 mm of bone.  The wound was  irrigated.  Bone wax was applied.  The wound was injected 30 mL of Marcaine  with epinephrine and closed with 2-0 Monocryl and 3-0 Prolene in a running  suture and Steri-Strips.  The portal sites were closed with staples.  Sterile dressing was applied along with a cryo cuff.  The patient was  extubated, taken to recovery room in stable condition where she will be  discharged home when stable, follow-up on Thursday, PT on Friday.  She is  discharged on Tylox #90, Flexeril 5 mg q.8 #30 with two refills and also  Duragesic patch 50 mcg q.72 h. #5.  We would only like to use that for the  first 2 weeks while the acute pain is present and then she can resume just  regular Tylox.       Vickki Hearing, M.D.  Electronically Signed     SEH/MEDQ  D:  03/04/2006  T:  03/04/2006  Job:  [id number]

## 2011-02-01 NOTE — H&P (Signed)
[facility]  Patient:    [name], [name]                         MRN: [id number] Adm. 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 Since her previous evaluation, the patient does not feel as though the Dilaudid has significantly added to her pain control this time and actually would prefer to go off of it.  She continues on the Tylox, Amaryl and Xanax. She notes that her pain is made worse by standing at a sink or twisting and improved by sitting and rocking gently.  She rates her pain at 7/10 today. She notes no new neurologic symptoms.  She is very concerned about the future and whether she will ever have any significant improvements.  CURRENT MEDICATIONS:  Tylox, Amaryl, Xanax and Dilaudid.  EXAMINATION  VITAL SIGNS:  Blood pressure 140/68, heart rate is 86, respiratory rate is 16, O2 saturation is 98% and pain level is 7/10.  NEUROLOGIC:  Straight leg raise signs are negative and deep tendon reflexes are symmetric.  She has tenderness on hyperextension in the mid-to-low lumbar facet regions with reduced pain on forward flexion.  IMPRESSION 1. Low back pain in the basis of failed back surgery syndrome and lumbar    spondylosis. 2. Anxiety. 3. Depression. 4. Diabetes.  DISPOSITION 1. Continue on Tylox 5/500 1 p.o. q.6h., #120 with no refills. 2. Discontinue Dilaudid. 3. Xanax 1 mg 1 p.o. q.8h., #100 with 2 refills. 4. Trial of Trileptal 150 mg one-half tablet p.o. q.p.m. x 7 days, then one    and a half tablets p.o. b.i.d. thereafter if tolerated.  I reviewed the    side-effects in detail of this. 5. Follow up with me in four weeks.  She was encouraged to continue on her    diabetes medicines per ______  . DD:  08/05/00 TD:  08/05/00 Job: [id number]

## 2011-02-01 NOTE — H&P (Signed)
NAME:  [name], MEFFERD NO.:  [id number]   MEDICAL RECORD NO.:  [id number]                   PATIENT TYPE:  INP   LOCATION:  A313                                 FACILITY:  [facility]   PHYSICIAN:  Sarita Bottom, M.D.                  DATE OF BIRTH:  11-Oct-1950   DATE OF ADMISSION:  09/19/2002  DATE OF DISCHARGE:                                HISTORY & PHYSICAL   CHIEF COMPLAINT:  I am short of breath.   HISTORY OF PRESENT ILLNESS:  The patient is a 60 year old lady with a  history of hypertension, diabetes and asthma.  She was apparently well until  about three days ago, when she developed an insidious onset of shortness of  breath which was typical of her usual asthma attack.  She came to the  emergency room, however, she was treated and discharged.  She, however,  continued to deteriorate with more shortness of breath and she also  developed diarrhea.  She decided therefore to come to the emergency room for  further evaluation.  In the emergency room, she was put on continuous  nebulization, but she still continued to be dyspneic and had wheezes  bilaterally in her chest, hence the decision to admit her.   REVIEW OF SYSTEMS:  She denies any weakness; admits to a cough; she also  complains of pleuritic chest pain; denies any palpitations, denies any  nausea or vomiting.  She said she had six diarrheal stools today, were non-  bloody and non-mucoid.  She denies any dizziness or lightheadedness.   PAST MEDICAL HISTORY:  Past medical history is significant for asthma since  childhood.  She has high blood pressure.  She has diabetes.  She has a  history of chronic back pain and anxiety for which she takes Xanax.  Her  asthma is usually precipitated by weather changes.  She has never been  intubated.  She is not on steroids.   MEDICATIONS:  1. Amaryl 4 mg every day.  2. Xanax 2 mg.  3. Diovan 80 mg.  4. Insulin -- Novolin 20 units in the morning and 10  units in the evening.  5. Tricor 160 mg every day.   ALLERGIES:  She is allergic to CIPRO, PREDNISONE and SULFA DRUGS.   FAMILY HISTORY:  Her sister also has asthma.   SOCIAL HISTORY:  She is married with three boys.  She stopped smoking about  three months ago but does not drink alcohol.   PHYSICAL EXAMINATION:  VITAL SIGNS:  On examination, blood pressure is  111/84, heart rate of 101, respiratory rate is 20, oxygen saturation is 99%.  She is afebrile.  GENERAL:  This is a middle-aged lady in mild respiratory distress.  She  talks in complete sentences.  HEAD, EARS, NOSE AND THROAT:  She is not pale.  She is anicteric.  She has  a  coated tongue.  NECK:  Her neck is supple.  There is no thyromegaly.  There is no jugular  venous distention.  CHEST:  Air entry is reduced bilaterally.  She has bilateral expiratory  wheezes.  No crackles could be heard.  CV:  Heart sounds 1 and 2 are normal.  Rhythm appears regular.  No murmurs  were heard.  ABDOMEN:  Abdomen is soft.  Bowel sounds are present.  No masses or  organomegaly could be felt.  CNS:  She is alert and oriented x3.  She has no gross or focal deficits.  EXTREMITIES:  She has no edema.   LABORATORY DATA:  WBC is 8.4 on her lab test, hemoglobin is 16.1,  neutrophils are 45%, lymphocytes are 42%, MCV is 85.2, platelet count is  255,000.  Sodium is 126, potassium is 3.2, chloride is 95, CO2 is 31, BUN of  20, creatinine of 1.7, glucose is 165, calcium is 8.3.  ABG shows pH of  7.43, PCO2 of 33.5, PO2 of 68.8, bicarb of 22.1, O2 saturation of 94.4%,  query FIO2.   Her chest x-ray shows no cardiomegaly.  She has questionable left lower lobe  infiltrates.   Her sodium is 126, potassium is 3.2, chloride is 95, CO2 is 31, BUN of 20,  creatinine of 1.7, glucose of 165, calcium of 8.3.   ASSESSMENT AND PLAN:  1. Status asthmaticus.  The patient will be admitted to the ward.  She will     be put on intravenous Solu-Medrol 175 mg  q.6h.  She will be given     Atrovent and albuterol nebulization q.4h.  She will also be given oxygen     via nasal cannula at 4 l/min.  A repeat arterial blood gas will be done     on this setting.  2. Questionable left lower lobe infiltrate.  The patient will be treated     empirically with intravenous Levaquin 500 mg every day.  3. Hyponatremia which is probably due to dehydration.  The patient's serum     osmolality level will be measured at this time.  She will be given     intravenous normal saline at 150 cc/hr and we will follow up with a BMET     tomorrow and discharge home if this improves her sodium.  For her     hypokalemia, the patient will be given K-Dur 20 mEq b.i.d. and we will     continue to monitor her serum potassium level.  4. Hypertension.  For the hypertension, the patient will be resumed on her     Diovan 80 mg every day.  5. Diabetes mellitus.  For the diabetes mellitus, the patient will be     continue on her Amaryl 5 mg every day and she will be resumed on her     Novolin 20 units q.a.m. and 10 units q.p.m.  She will also be put on Accu-     Check q.6h.  6. For the history of back pain, she will be resumed on Tylox one tablet     q.6h. p.r.n.  7. For the anxiety, for which she takes Xanax, we will resume her regular     doses of Xanax.  8. Further management and workup will depend on clinical course.  The     patient is to be under the case of Dr. Madelin Rear. Fusco.  Sarita Bottom, M.D.    DW/MEDQ  D:  09/20/2002  T:  09/20/2002  Job:  [id number]

## 2011-02-01 NOTE — H&P (Signed)
[facility]  Patient:    [name], [name]                         MRN: [id number] Adm. Date:  [id number] Attending:  Thyra Breed CC:         Dr. Kyung Rudd [place]   History and Physical  FOLLOW-UP EVALUATION:  This is a redictation of a note that we cannot find. [name] was seen on May 08, 2000, at which time she states that she was not doing well.  She stated that she was having to care for her husband, who had recently had surgery, and the care of him had exacerbated her lower back discomfort, with increased discomfort radiating out to her left lower extremity.  She does feel as though as though the Tylox/Dilaudid combination does help her to a degree, but she rated her pain at 8 out of 10 today, but she ran out of her medications prior to seeing [place].  She continues on Xanax and Amaryl.  Most of her pain is in her lower back, radiating out predominantly into the left lower extremity along the lateral aspect of the leg.  PHYSICAL EXAMINATION:  VITAL SIGNS:  Blood pressure 150/76, heart rate 83, respiratory rate 16, O2 saturation 95%, pain level is 8 out of 10, and temperature is 98.2 degrees.  MUSCULOSKELETAL/NEUROLOGIC:  Straight leg raise sign was positive on the left side.  Deep tendon reflexes were symmetric at the knees, 1+ at the left ankle, absent at the right.  IMPRESSION: 1. Low back pain predominantly on the basis of degenerative disk disease. 2. Anxiety with ongoing poor coping skills.  DISPOSITION: 1. Continue Xanax 1 mg one p.o. q.8h p.r.n., #90 with two refills. 2. Dilaudid 4 mg one p.o. q.6h. p.r.n. breakthrough pain, #30 with no refill. 3. Tylox 5/500 mg one p.o. q.6h. p.r.n. pain, #120 with no refill. 4. Follow up with me in four to eight weeks. DD:  05/16/00 TD:  05/17/00 Job: [id number]

## 2011-02-01 NOTE — H&P (Signed)
[facility]  Patient:    [name], [name]                         MRN: [id number] Adm. Date:  [id number] Attending:  Thyra Breed CC:         Dr. Sherwood Gambler, [facility]  [place]   History and Physical  PROCEDURE:  Facet joint nerve blocks at L3-4, L4-5, and L5-S1 on the left side.  DIAGNOSIS:  Lumbar spondylosis with facet joint syndrome.  INTERVAL HISTORY:  The patient continues to have a significant amount of pain predominantly in the left side of her lower back.  It is accentuated by hyperextension and coughing.  There is some radiation out to the leg to a lesser extent.  Her current medications include Dilaudid with Tylox p.r.n.  At her last visit on September 24, we discussed facet joint nerve blocks, and she presents today for a series of these.  She states that she understands the potentials risks in detail.  PHYSICAL EXAMINATION:  VITAL SIGNS:  Blood pressure 152/82, heart rate 91, respiratory rate 18, O2 saturation 98%, temperature is 97.1, pain level is 6 out of 10.  MUSCULOSKELETAL/NEUROLOGIC:  She is tender over the left L5-S1 joint to palpation predominantly and to a lesser extent at 4-5.  DESCRIPTION OF PROCEDURE:  After informed consent was obtained, the patient was placed in the prone position with a pillow under her abdomen and monitored.  Using fluoroscopic guidance, I identified the lumbar vertebrae L1 through L5.  A mark was made on the skin overlying the junction of the superior articulating process and the transverse process of L3-4 and L4-5 and at the sacral cornu at L5-S1.  Skin was prepped with Betadine x 3 and draped. Using the 25 gauge needle and 2 cc of 1% lidocaine at each level, I anesthetized the skin and subcutaneous tissues.  A 25-gauge spinal needle was introduced to the junction of the superior articulating process and transverse at L3-4 and L4-5 and at the sacral cornu at L5-S1.  At L5-S1 because  of some osteophytosis, it was difficult to get the needle placed all the way to the junction of the SAP and transverse aspect of the cornu.  Lidocaine 1% 1 cc was injected at each level.  There was no evidence of a spinal nor nerve root injection.  Medrol 13 mg in 1 cc of 1% lidocaine was injected at each level. The needle was flushed with 0.5 cc of 1% lidocaine and removed intact.  POSTPROCEDURE CONDITION:  Five minutes after the procedure, the patient noted a marked reduction of pain but persistence of pain.  I advised her that this does not conclusively demonstrate that most of her pain is coming from the facet joints, and we would need to consider either repeating the block versus just continuing on current pain management.  She wishes to contemplate this.  DISPOSITION: 1. Resume previous diet. 2. Limitation of activities per instruction sheet as outlined by my assistant    today. 3. Continue on current medications. 4. Follow up with me in two weeks. DD:  06/26/00 TD:  06/27/00 Job: [id number]

## 2011-02-01 NOTE — H&P (Signed)
NAME:  [name], [name] NO.:  [id number]   MEDICAL RECORD NO.:  [id number]                   PATIENT TYPE:   LOCATION:                                       FACILITY:   PHYSICIAN:  Corrie Mckusick, M.D.               DATE OF BIRTH:  30-Dec-1950   DATE OF ADMISSION:  04/04/2003  DATE OF DISCHARGE:                                HISTORY & PHYSICAL   PRIMARY CARE PHYSICIAN:  Madelin Rear. Sherwood Gambler, M.D.   ADMISSION DIAGNOSIS:  Asthma exacerbation.   ADDITIONAL DIAGNOSES:  1. Diabetes.  2. Hypertension.   HISTORY OF PRESENT ILLNESS:  A 60 year old female with diabetes,  hypertension and asthma, who continues to smoke and presented to the  emergency department with shortness of breath.  She called me earlier on the  telephone and said that she was short of breath and I advised her to go to  the emergency department at that moment.  She waited and appeared quite a  bit later at Midnight.  The patient has had two to three days of preceding  malaise and shortness of breath.  She also has some productive cough with  yellowish sputum.  No fevers, chills.  She had multiple thymomas with  various respiratory tract infections.  Cardiovascular, GI, GU symptomatology  are all negative.  The patient was planning for hysterectomy next week.   PAST MEDICAL HISTORY:  1. Diabetes.  2. Hypertension.   PAST SURGICAL HISTORY:  Multiple back operations.   ADMISSION MEDICATIONS:  1. Amaryl 8 mg p.o. b.i.d.  2. Diovan 80 mg p.o. daily.  3. Tylox 2 p.o. q.6h. p.r.n.   ALLERGIES:  CIPRO.   SOCIAL HISTORY:  Continues to smoke.   PHYSICAL EXAMINATION:  VITAL SIGNS:  Temperature 98.2, respiratory rate 22,  heart rate 78, blood pressure 162/79.  Weight 190.  O2 saturation 98% on  room air.  GENERAL:  When I saw the patient, she was pleasant, talkative and feeling  quite a bit better after initial visit in the ER.  HEENT:  Oropharynx with moist mucous membranes, no  erythema.  NECK:  No lymphadenopathy, no JVD.  CHEST:  Bilateral end-expiratory wheezes throughout.  No areas of  consolidation.  CARDIOVASCULAR:  Regular rate and rhythm, normal S1, S2.  ABDOMEN:  Soft, nontender, nondistended.  EXTREMITIES:  No edema.   ASSESSMENT:  A 60 year old female with diabetes, hypertension, who continues  to smoke, who admits with asthma exacerbation and upper respiratory  infection.   PLAN:  1. Admit for aggressive nebulizer treatments, albuterol, Atrovent.  2. Cover with prednisone 50 mg p.o. daily.  3. Cover with Rocephin, azithromycin due to CIPRO allergy.  4. Strongly encourage her to discontinue smoking.  5. Will continue to follow closely.  Corrie Mckusick, M.D.    JCG/MEDQ  D:  04/04/2003  T:  04/04/2003  Job:  [id number]

## 2011-02-01 NOTE — Discharge Summary (Signed)
NAME:  [name], [name] NO.:  [id number]   MEDICAL RECORD NO.:  [id number]                   PATIENT TYPE:  INP   LOCATION:  A212                                 FACILITY:  [facility]   PHYSICIAN:  Madelin Rear. Sherwood Gambler, M.D.             DATE OF BIRTH:  November 01, 1950   DATE OF ADMISSION:  05/08/2004  DATE OF DISCHARGE:  05/10/2004                                 DISCHARGE SUMMARY   DISCHARGE DIAGNOSES:  1.  Acute exacerbation of chronic obstructive pulmonary disease.  2.  Acute bronchitis.  3.  Diabetes mellitus type 2.  4.  Hypertension.  5.  Osteoarthritis.  6.  Degenerative joint disease, chronic.   DISCHARGE MEDICATIONS:  1.  Keflex 500 mg p.o. q.i.d.  2.  Prednisone dose pack tapered over one week.  3.  Tylox p.r.n.  4.  Xopenex nebulizer.  5.  Advair 100/50 b.i.d.  6.  Glucophage 500 mg p.o. b.i.d.  7.  Amaryl 8 mg p.o. daily.  8.  Avapro 150 mg p.o. daily.  9.  Theo-Dur 200 mg p.o. b.i.d.   SUMMARY:  Patient admitted with progressively increasing shortness of  breath, cough productive of sputum.  She was admitted with IV steroids and  improved.  Patient is discharged for follow-up.  She had no adverse events  in-house.     ___________________________________________                                         Madelin Rear. Sherwood Gambler, M.D.   LJF/MEDQ  D:  05/22/2004  T:  05/22/2004  Job:  [id number]

## 2011-02-01 NOTE — H&P (Signed)
[facility]  Patient:    [name], [name]                         MRN: [id number] Adm. Date:  [id number] Attending:  Thyra Breed                         History and Physical  [name] comes in for followup evaluation of her chronic low back pain on the basis of lumbar spondylosis with predominance of facette joint syndrome. Since her last evaluation she fell two weeks ago which she attributed to her Antivert and injured her right knee.  She was seen by Dr. ______ who treated her with IM analgesics and Skelaxin.  She was intolerant of the Skelaxin with nausea.  She has gradually improved, but feels as though she has jarred something in her back which is slower to improve.  Nevertheless, she is responding positively to the Tylox/Xanax combination.  She rates her pain as 6/10 today.  She notes her pain is made worse by walking, sitting, standing, or bending and improved by the medications as well as rest.  She denied any new neurologic symptoms.  CURRENT MEDICATIONS:  Tylox, Amaryl, Xanax.  PHYSICAL EXAMINATION  VITAL SIGNS:  Blood pressure 137/72, heart rate 102, respiratory rate 17, O2 saturation 98%, pain level 6/10.  EXTREMITIES:  Deep tendon reflexes were symmetric in the lower extremities. Her right knee revealed a healing wound which has healed over fairly nicely. She had negative drawer signs and no tenderness of the joint space lines.  She has no effusion of the knee.  BACK:  Demonstrates tenderness in the lower lumbar facette regions to hyperextension with lessening on forward flexion.  IMPRESSION: 1. Low back pain on the basis of facette joint syndrome secondary to lumbar    spondylosis status post surgical intervention. 2. Right knee pain status post injury and healing. 3. Other medical problems per Dr. ______ in [place].  DISPOSITION: 1. Continue Tylox one to two p.o. q.6h. p.r.n. #150 with no refill. 2. Remain off Skelaxin since it is  upsetting her stomach. 3. Xanax as needed. 4. Follow-up with me in four to eight weeks. DD:  12/11/00 TD:  12/11/00 Job: [id number]

## 2011-02-01 NOTE — H&P (Signed)
[facility]  Patient:    [name], [name]                         MRN: [id number] Adm. Date:  [id number] Attending:  Thyra Breed CC:         Dr. Carlena Sax - [facility], [place], [place]                         History and Physical  CHIEF COMPLAINT:  Alexya comes in for a follow-up evaluation.  It has been about  eight weeks since we saw her.  She has had two trips to the emergency room in that time.  She saw Dr. Carlena Sax, and she reiterated to him her previous medical history. We have tried Wayne on multiple long-acting opiates and she cannot tolerate them, due to side effects.  She is currently left with Tylox 5/500 which she takes q.i.d. with Xanax, which makes things more tolerable.  Unfortunately, she is having more breakthrough pain on top of this.  She continues with Amaryl for her diabetes mellitus.  Her left knee discomfort continues to bother her, and it may be coming from her back, more so than her knee, based on what she is telling me about a scan she has had done.  PHYSICAL EXAMINATION:  VITAL SIGNS:  Blood pressure 129/64, heart rate 66, respirations 18.  O2 saturation 95%.  Pain level 6/10.  Temperature 97 degrees.  NEUROLOGIC:  Her straight leg raises are negative today.  Deep tendon reflexes re symmetric.  Motor is unchanged.  IMPRESSION: 1. Low back pain, status post Ray cage placement at L4-5 and L5-S1, with    ongoing pain radiating out, predominantly to the lower extremity. 2. Diabetes mellitus per Dr. Carlena Sax.  DISPOSITION: 1. Continue Tylox 5/500 one p.o. q.6h., #120, with no refills. 2. Continue Xanax 1 mg one p.o. t.i.d. 3. Try Dilaudid 2 mg one p.o. q.6h. p.r.n. severe pain for breakthrough pain. 4. Follow up with me in four weeks.  The patient has not done well by    spreading out her visits to eight weeks. DD:  03/07/00 TD:  03/07/00 Job: [id number]

## 2011-02-01 NOTE — H&P (Signed)
[facility]  Patient:    [name], [name]                         MRN: [id number] Adm. Date:  [id number] Attending:  Thyra Breed CC:         ________________                         History and Physical  FOLLOWUP EVALUATION:  Haeberle comes in for followup evaluation of her chronic low back pain on the basis of a lumbar spondylosis.  Since her last evaluation, she has had a flare-up and saw ______, who encouraged her to go ahead and take two tablets of her Tylox, which significantly reduced her discomfort and kept her from going to the emergency room.  She was concerned about taking this without clearing it with me.  We discussed this and I advised her that she would probably be better off to stick with one tablet four times a day, unless she had a severe flare, then she could double up for a very short period of time; otherwise, it will lose its effectiveness.  She took two doses of the Trileptal and noted after the first dose, that she was developing a papulosquamous, plaque-like eruption over the lower extremities, left starting, then right, which does not itch.  She has not appreciated a great deal of scaling.  She has not taken any more Trileptal for four weeks and it has continued to be present.  I advised her that it may be coming from her diabetes and not from the Trileptal, but she does not wish to go back on the Trileptal.  CURRENT MEDICATIONS:  Tylox, Xanax and Amaryl as well as insulin.  EXAMINATION  VITAL SIGNS:  Blood pressure is 132/49, heart rate is 89, respiratory rate is 20, O2 saturation is 95% and pain level is 6/10.  NEUROLOGIC:  Straight leg raise signs are negative.  Deep tendon reflexes are symmetric in the lower extremities.  Hyperextension to 15 degrees increases her back discomfort fairly significantly.  IMPRESSION 1. Low back pain on the basis of lumbar spondylosis, predominantly. 2. Anxiety. 3. Depression. 4.  Diabetes.  DISPOSITION 1. Continue Tylox 5/500, 1 to 2 p.o. q.6h. p.r.n., #150 with no refill. 2. Continue Xanax as previously. 3. Remain off the Trileptal for the time-being.  If her rash does not clear,    she was encouraged to follow up with ______ to see whether she needs a    dermatologist to look at things.  She was encouraged to try to keep her    blood sugars tightly controlled. 4. Follow up with me in four to six weeks. DD:  09/12/00 TD:  09/12/00 Job: [id number]

## 2011-02-01 NOTE — Consult Note (Signed)
[facility]  Patient:    [name], [name]                         MRN: [id number] Proc. Date: 06/09/00 Adm. Date:  [id number] Attending:  Thyra Breed CC:         Dr. _______, [facility], [place], [place]   Consultation Report  FOLLOWUP EVALUATION:  The patient comes in for a followup evaluation of her chronic low back pain on the basis of lumbar spondylosis.  Since her previous evaluation, the patient has noted that her back pain is really only mildly improved at best.  She did develop an upper respiratory tract infection and with the coughing, it seemed to irritate her back quite a bit.  She is also having to take care of her husband who is convalescing from a total knee replacement and she is frustrated with having to do the physical therapy aspects of this.  She notes that the Tylox with the Dilaudid helps, but the Dilaudid tends to hype her up quite a bit.  She rates her pain at 8/10 today. She continues on the Xanax and Amaryl in addition.  She complains predominantly of lower back discomfort radiating from the lumbosacral region out into her left lower extremity to the lateral aspect of the leg.  It is made worse by activity.  EXAMINATION  VITAL SIGNS:  Blood pressure is elevated to 180/75, heart rate is 100, respiratory rate is 14, O2 saturation is 97% and pain level is 8/10.  NEUROLOGIC:  Straight leg raise signs are negative today.  Deep tendon reflexes were symmetric at the knees, 1+ at the right ankle and absent at the left.  She exhibited tenderness over the lower left lumbar facet joints which was increased by hyperextension and rotation to the left.  IMPRESSION 1. Low back pain which is tending to look more like a facet syndrome today    than at previous visits, with ongoing pain despite her current medical    regimen.  We are limited by her intolerances and allergies. 2. Anxiety with poor coping skills. 3. Recent upper  respiratory tract infection.  DISPOSITION 1. Continue Xanax. 2. Continue Dilaudid 4 mg 1 p.o. q.6h. p.r.n. severe breakthrough pain, #30    with no refill. 3. Tylox 5/500 1 p.o. q.6h. p.r.n. pain, #120 with no refill. 4. I discussed extensively facet nerve blocks with her today and advised her    of the potential risks as well as benefits.  She wants to think about these    and we will go ahead and schedule her back in two weeks to perform these,    if she agrees. DD:  06/09/00 TD:  06/09/00 Job: [id number]

## 2011-02-01 NOTE — Discharge Summary (Signed)
   NAME:  [name], [name] NO.:  [id number]   MEDICAL RECORD NO.:  [id number]                   PATIENT TYPE:  INP   LOCATION:  A309                                 FACILITY:  [facility]   PHYSICIAN:  Corrie Mckusick, M.D.               DATE OF BIRTH:  [birth year]   DATE OF ADMISSION:  04/03/2003  DATE OF DISCHARGE:  04/06/2003                                 DISCHARGE SUMMARY   DISCHARGE DIAGNOSIS:  Asthma exacerbation, resolved.   HISTORY OF PRESENTING ILLNESS AND PAST MEDICAL HISTORY:  Please see  admission H&P.   HOSPITAL COURSE:  This is a 60 year old female with diabetes, hypertension,  and asthma who continues to smoke who presented with asthma exacerbation and  bronchitis.  She was covered with aggressive nebulizer treatments as well as  prednisone, Rocephin, and azithromycin.  She did quite well on a daily  basis.  Much less shortness of breath the day after admission.  Blood sugars  were elevated due to the steroids.  Avandamet was added 2 mg/500 b.i.d.  On  April 05, 2003 she was changed to p.o. medications in hopes that she could be  discharged on the following day.  She did quite well on p.o. medications and  on the following morning on July 21 was ready for discharge.   DISCHARGE PHYSICAL EXAMINATION:  VITAL SIGNS:  Afebrile, vital signs stable  except for elevated blood sugars in the 400s.  GENERAL:  Pleasant female in no acute distress.  CHEST:  Greatly improved aeration, much less wheezing.  CARDIOVASCULAR:  Regular rate and rhythm, no murmurs.   MEDICATIONS:  Discharge medications are the same as admission with the  addition of:  1. Combivent MDI two puffs via spacer q.4-6h.  2. Avandamet 2/500 b.i.d.  3. Augmentin 875 for 10 days.  4. Advair 250/50 b.i.d.  5. Prednisone taper 40 mg x3 days, 30 mg x3 days, 20 mg x3 days, 10 mg x3     days.   DISCHARGE CONDITION:  Improved and stable.   FOLLOW-UP:  With Dr. Sherwood Gambler in one week.                                             Corrie Mckusick, M.D.    JCG/MEDQ  D:  04/06/2003  T:  04/06/2003  Job:  [id number]

## 2011-02-01 NOTE — H&P (Signed)
[facility]  Patient:    [name], [name]                         MRN: [id number] Adm. Date:  [id number] Attending:  Thyra Breed CC:         Dr. Carlena Sax, [facility], [place]                         History and Physical  FOLLOW-UP EVALUATION:  Cherylin comes in for follow-up evaluation of her chronic low-back pain on the basis of degenerative disk disease and myofascial pain status post Ray cage placement.  Since her previous evaluation, the patient has noted that her pain seemed to be mildly improved with maintaining the Tylox with intermittent Dilaudid for breakthrough pain and the Xanax.  She states that her pain level continues to be at around 6/10.  She has had her left great toenail removed since her last visit.  She continues on Amaryl.  She complains mostly of lower back discomfort on the left side, which is made worse by activities.  PHYSICAL EXAMINATION:  VITAL SIGNS:  Blood pressure 150/69, heart rate 88, respiratory rate is 18. O2 saturation is 96%.  Pain level is 6/10.  EXTREMITIES:  Straight leg raising signs are negative today.  Deep tendon reflexes are symmetric at the knees, 1+ at the left ankle, absent on the right.  IMPRESSION: 1. Low-back pain with degenerative disk disease with predominant element of    myofacial pain. 2. Anxiety with poor coping skills.  DISPOSITION: 1. Continue on Tylox 5/500 one p.o. q.6h. p.r.n., #120 with no refill. 2. Xanax 1 mg one p.o. q.8h., #100 with two refills. 3. Dilaudid 2 mg one p.o. q.6-8h. p.r.n. breakthrough pain, #30 with no    refill. 4. Followup with me in four weeks. DD:  04/04/00 TD:  04/05/00 Job: [id number]

## 2011-02-01 NOTE — H&P (Signed)
NAME:  [name], [name]                            ACCOUNT NO.:  [id number]   MEDICAL RECORD NO.:  [id number]                   PATIENT TYPE:  INP   LOCATION:  A211                                 FACILITY:  [facility]   PHYSICIAN:  Johna Roles                    DATE OF BIRTH:  Apr 02, 1951   DATE OF ADMISSION:  04/11/2002  DATE OF DISCHARGE:  04/12/2002                                HISTORY & PHYSICAL   CHIEF COMPLAINT:  Recurrent chest pain, shortness of breath.   HISTORY OF PRESENT ILLNESS:  This is a known diabetic, has been having chest  pain on and off since lunch time on April 10, 2002.  Left-sided described as  a "twinge."  This recurred about five times, then she went to bed.  She woke  up at about  midnight with the same pain but this time accompanied by  shortness of breath, diaphoresis and nausea.  Was transient but kept on  recurring.  Spouse brought her to the emergency room at [facility].   PAST MEDICAL HISTORY:  1. Diabetes mellitus type 2.  2. Hypertension.  3. Obesity.  4. Hyperlipidemia.  5. Anxiety/panic attacks.  6. Chronic low back pain, degenerative joint disease and osteoarthritis.   SURGICAL HISTORY:  Status post back surgery, right elbow surgery and foot  surgery.   MEDICATIONS:  1. Insulin.  It is not clear whether this is Humulin N or Humulin 70/30.     She takes 22 units subcutaneously q.a.c. and 10 units subcutaneously     q.a.c.  2. Amaryl 8 mg p.o. q.d.  3. Atacand 8 mg p.o. q.d.  4. Xanax 1 mg p.r.n. q.8h.  5. Tricor 160 mg p.o. q.d.   ALLERGIES:  No known drug allergies.   SOCIAL HISTORY:  Married, has three sons, all of whom are alive and well.  Smokes about half a pack of cigarettes per day.  Denies alcohol use or drug  abuse.   PHYSICAL EXAMINATION:   VITAL SIGNS:  Temperature 97.6, pulse 93 per minute, blood pressure 113/62,  respiratory rate 16.   GENERAL:  She was comfortable, not shortness of breath at rest and  quite  communicative.   HEENT:  No clinical pallor.  No jaundice.  No conjunctival injection.  Neck  was supple.  JVP was not seen because of fat neck.  No goiter was palpable.  She has a right carotid bruit.   CHEST:  Clinically clear.  There were no wheezes, no crackles.  Heart sounds  were normal, regular.  There were no murmurs.   ABDOMEN:  Obese, soft and nontender.  There was no palpable organomegaly.  Bowel sounds were normal.   EXTREMITIES:  Lower extremity examination revealed minimal pitting edema.   LABORATORY DATA:  Chest X-ray showed hyperinflated lungs, normal  mediastinum, consistent with COPD.  EKG showed sinus  rhythm, regular, normal  axis, 85 per minute, poor R-wave progression in _______ chest leads but no  acute ischemic changes.   Total CK 313, CK-MB 3.7, troponin I less than 0.01.  Sodium 135, potassium  3.5, chloride 103, CO2 34, BUN 7, creatinine 0.8, glucose 278.  Liver  function tests were normal.  D-dimer less than 0.5.  CBC:  WBC 9.0,  hemoglobin 14.5, hematocrit 41.2, platelet count 266,000.   ASSESSMENT/PLAN:  Admit to telemetry unit.  1. Chest pain, rule out coronary artery disease.  The patient has multiple     risk factors.  We have therefore started nitro paste to the chest wall,     commenced her on aspirin as well as Lovenox subcutaneously in two doses     as well as prophylaxis for deep venous thrombosis and we have requested     cardiology consult.  I understand [facility] will be following up with     her.  Therefore, we have requested consult accordingly.  2. Type 2 diabetes mellitus.  Monitor finger sticks, commence sliding scale     insulin and American Diabetic Association diet.  3. Obesity/hyperlipidemia.  Do TSH, continue Tricor.  Check lipid profile.  4. Chronic back pain.  Tylenol p.r.n. for now.  5. Hypertension control.  Will continue current antihypertensive.  It might     be that she has chronic obstructive pulmonary disease  and this may be     contributory.  We would advise formal pulmonary function tests upon     discharge.                                                      Johna Roles    CO/MEDQ  D:  04/11/2002  T:  04/14/2002  Job:  [id number]

## 2011-02-01 NOTE — H&P (Signed)
[name], [name] NO.:  [id number]   MEDICAL RECORD NO.:  [id number]          PATIENT TYPE:  AMB   LOCATION:  DAY                           FACILITY:  [facility]   PHYSICIAN:  Vickki Hearing, M.D.DATE OF BIRTH:  [birth year]   DATE OF ADMISSION:  DATE OF DISCHARGE:  LH                                HISTORY & PHYSICAL   CHIEF COMPLAINT:  Pain in the right shoulder.   HISTORY OF PRESENT ILLNESS:  This is a 60 year old female with no history of  injury to the right shoulder but pain which started end of 2006 and worsened  over a 4 month period.  She sought initial treatment with Dr. Delbert Harness, who  eventually sent here for a consultation.  Primary complaints are pain with  forward elevation, weakness of the right shoulder associated with stiffness  and difficulty with activities of daily living.  She was treated for  adhesive capsulitis of the shoulder with intense physical therapy and then  had an MRI of the shoulder to rule out a rotator cuff tear.  The rotator  cuff was intact, but she had severe AC joint arthritis with subacromial  bursitis and a subacromial spur.   She presents to the hospital for arthroscopy of the right shoulder and  arthroscopic distal clavicle removal, subacromial decompression with  acromioplasty.   She has consented to surgery, understands the risks and benefits of the  procedure which include continued pain, stiffness, neurovascular injury.  If  she does not have surgery, she understands that she would continue to have  pain, and alternative treatments have been tried, and none have been  successful.   REVIEW OF SYSTEMS:  This patient has COPD and asthma, unsteady gait,  diabetes, depression, anxiety, and panic attacks with poor vision.  Her  cardiac, GI, urinary systems are normal.  She has no constitutional  symptoms.  Skin, denied eczema, cancer, itching.  Immunosystem, no sinusitis  or lymph node type cancer.   The patient  reports allergy to CODEINE but has taken Tylox without  difficulty.   MEDICAL PROBLEMS:  1.  Anxiety/panic attack.  2.  Depression.  3.  Diabetes.  4.  Asthma.   SURGERIES:  1.  A back fusion x2 in the lower back.  2.  Cholecystectomy.  3.  Foot and elbow surgery.  4.  She has residual symptoms of her lumbar fusion with radicular pain in      her leg on occasion.   MEDICINES:  Metformin, Diovan, Lantus, and Januvia.   FAMILY HISTORY:  Lung disease, diabetes, asthma, cancer, arthritis, and  heart disease.   SOCIAL HISTORY:  Her family physician is Dr. Delbert Harness.  She is married,  work status is retired.  She smokes a pack a day, doesn't drink or use  caffeine, and educational level completed grade 10.   PHYSICAL EXAMINATION:  VITAL SIGNS:  Weight 180, pulse 82, respiratory rate  20.  GENERAL APPEARANCE:  Normal.  CARDIOVASCULAR:  Normal peripheral vascular system by observation and  palpation.  LYMPH NODES:  Normal in  the neck and supraclavicular areas.  MUSCULOSKELETAL:  Normal gait.   Examination of the right shoulder showed a painful range of motion  throughout all planes.  Her motion improved from 90 degrees of flexion, 40  degrees external rotation, and painful internal rotation of the hip to 90  degrees of active motion, passive motion 120, external rotation to 45 and  internal rotation to approximately L1.  Her shoulder is stable.  She had  strength of 5/5, and infernal and external rotation 4/5 supraspinatus.  She  had tenderness over the [facility] joint, posterior lateral and anterior acromion,  soft tissue of the deltoid.   Left shoulder was unremarkable with no tenderness.  It had normal motion, no  instability, and normal strength.  Lower extremities showed normal alignment  without contracture dislocation spasticity, and strength was normal.  Skin,  head, and neck, both upper extremities, trunk were normal except for  superficial scratches.  NEUROLOGIC:  Normal  reflexes, sensation, orientation, mood, and  coordination.   Radiographs at [facility] dated April 4, show [facility] joint arthrosis.  MRI scan  dated January 13, 2006, no contrast, right shoulder.  Rotator cuff  tendinopathy no tear, AC joint arthrosis, subacromial spur with bursitis.   DIAGNOSIS:  1.  Shoulder pain.  2.  Rotator cuff syndrome.  3.  Primary acromioclavicular joint arthrosis.   PLAN:  Arthroscopic subacromial decompression and acromioclavicular joint  resection, arthroscopic possible open.      Vickki Hearing, M.D.  Electronically Signed     SEH/MEDQ  D:  03/03/2006  T:  03/03/2006  Job:  [id number]

## 2011-02-01 NOTE — H&P (Signed)
[facility]  Patient:    [name], [name]                         MRN: [id number] Adm. Date:  [id number] Attending:  Thyra Breed CC:         Dr. Sherwood Gambler, [facility], [place]   History and Physical  FOLLOWUP EVALUATION  HISTORY OF PRESENT ILLNESS:  [name] comes in for follow-up evaluation of her chronic low back pain on the basis of lumbar facet joint arthritis and spondylosis.  Since her last evaluation, she has had a lot of pressure on her to take care of her mother-in-law who is 98 years old and had a perforated intestine.  She has spent a lot of time in the hospital and is rather put out about this whole situation.  She has noted that her back continues to bother her rather significantly.  She does continue with the Tylox, but feels as though the Xanax is not as helpful.  She is also taking Amaryl.  She describe no new neurologic symptoms.  PHYSICAL EXAMINATION:  VITAL SIGNS:  Blood pressure 135/76, heart rate 88, respiratory rate 20, and O2 saturation 96%.  Pain level 6/10.  NEUROLOGIC:  Her deep tendon reflexes were symmetric in the lower extremities. She shows increased pain on hyperextension into the back into the lower facet regions.  Straight leg raise signs were negative.  IMPRESSION: 1. Lumbar spondylosis with predominance of facet joint syndrome. 2. Other medical problems per Dr. Sherwood Gambler.  DISPOSITION: 1. Continue on Tylox 5/500 1-2 p.o. q.6h. p.r.n., #150 without refill. 2. Xanax 1 mg 1 p.o. q.8h. p.r.n., #90 with a refill.  She was advised that I    would write this for the nongeneric form. 3. Followup with me in eight weeks. DD:  04/02/01 TD:  04/02/01 Job: [id number]

## 2011-02-01 NOTE — Consult Note (Signed)
[facility]  Patient:    [name], [name]                         MRN: [id number] Proc. Date: 07/08/00 Adm. Date:  [id number] Attending:  Thyra Breed CC:         Dr. Audie Pinto, [place]   Consultation Report  FOLLOWUP EVALUATION:  [name] comes in for followup evaluation of her chronic low back pain on the basis of lumbar spondylosis.  Since her last evaluation, the patient noted she had a very short-lived response, which was incomplete, from the facet nerve blocks in her lower back.  She does not feel it was significant enough to warrant a radiofrequency lesioning of the nerves. She does note that she got hyper and her fuse was quite short after getting the corticosteroids; this seems to be improving gradually.  She continues on the Xanax, Tylox and p.r.n. Dilaudid.  She is frustrated with caring for her husband, who she feels is not getting with the program.  She continues to exercise regularly.  PHYSICAL EXAMINATION  VITAL SIGNS:  Blood pressure 144/77, heart rate is 84, respiratory rate is 14, O2 saturation is 96%, pain level is 8/10 and temperature is 97.2.  NEUROMUSCULAR:  Patient exhibits tenderness over the right paraspinous muscles with negative straight leg raise signs.  Deep tendon reflexes were 2+ and symmetric at the knees and 0-1+ at the ankles.  Straight leg raises were negative, as mentioned, and motor is unchanged.  IMPRESSION 1. Low back pain, status post fusion by Dr. 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 2. Anxiety, increased by her husbands current illness. 3. Depression. 4. Diabetes, per Dr. Sherwood Gambler.  DISPOSITION 1. Continue on the Tylox 5/500 1 p.o. q.6h., #120 with no refill. 2. Dilaudid 4 mg 1 p.o. q.6h. p.r.n., #30 with no refill. 3. Continue with the Xanax. 4. Follow up with me in four weeks. 5. Patient was encouraged  to continue with her current level of exercise. DD:  07/08/00 TD:  07/08/00 Job: [id number]

## 2011-02-01 NOTE — Discharge Summary (Signed)
   NAME:  [name], BLANE NO.:  [id number]   MEDICAL RECORD NO.:  [id number]                   PATIENT TYPE:  INP   LOCATION:  A313                                 FACILITY:  [facility]   PHYSICIAN:  Corrie Mckusick, M.D.               DATE OF BIRTH:  [birth year]   DATE OF ADMISSION:  09/19/2002  DATE OF DISCHARGE:  09/24/2002                                 DISCHARGE SUMMARY   HISTORY OF PRESENTING ILLNESS AND PAST MEDICAL HISTORY:  Please see  admission H&P.   HOSPITAL COURSE:  A 60 year old female with hypertension, diabetes, and  asthma who presented with status asthmaticus.  She was admitted for  aggressive nebulizer treatments, as well as IV antibiotics and steroids.  There is a questionable left lower lobe infiltrate.  She was treated  empirically with Levaquin 500 mg IV q.d.  Hyponatremia was due to  dehydration.  Hypertension and diabetes were stable.   The day after admission, the patient felt quite a bit improved with less  shortness of breath.  ABG was 7.29, 42.8, 117.2, and 20 on 2 L.  The  patient's antibiotics were continued.  She continued to improve daily and  was seen by Dr. Nobie Putnam on the two following days.   On September 23, 2002, the patient was less short of breath.  Hyponatremia was  still an issue.  Temperature had increased to 101.9 and it was felt like she  should be watched for 24 hours.  Steroids were changed to p.o.   Dr. Nobie Putnam saw the patient on day of discharge on September 24, 2002 and felt  like she was ready for discharge.  Please see his progress note for  discharge physical and details.   DISCHARGE MEDICATIONS:  1. Advair 100/50 one puff b.i.d.  2. Theo-Dur 200 b.i.d.  3. Prednisone taper.  4. Levaquin 500 mg daily x5 days.  5. Home medications as before.   FOLLOW UP:  Follow up with [facility] as discussed with the patient by  Dr. Nobie Putnam.                                               Corrie Mckusick,  M.D.    [place] Melter  D:  10/19/2002  T:  10/19/2002  Job:  [id number]

## 2011-02-01 NOTE — H&P (Signed)
NAME:  [name], Torpey NO.:  [id number]   MEDICAL RECORD NO.:  [id number]                   PATIENT TYPE:   LOCATION:  A212                                 FACILITY:   PHYSICIAN:  Madelin Rear. Sherwood Gambler, M.D.             DATE OF BIRTH:  [birth year]   DATE OF ADMISSION:  05/08/2004  DATE OF DISCHARGE:                                HISTORY & PHYSICAL   CHIEF COMPLAINT:  Shortness of breath and cough.   HISTORY OF PRESENT ILLNESS:  The patient has on and off for the past month  to month and a half had progressively increasing wheezing, shortness of  breath, and productive cough. She has been tried on several different  antibiotics as well as steroid and oral bronchodilator therapy without  relief of her shortness of breath and cough. She denies any true rigors or  chills. No hemoptysis. No syncope or chest pain.   PAST MEDICAL HISTORY:  1. Noninsulin-dependent diabetes mellitus.  2. Asthma with frequent exacerbation.  3. Hypertension.  4. Degenerative disk disease of the back.  5. Chronic recurring back pain.   SOCIAL HISTORY:  Former cigarette smoker but discontinued that habit by her  history. No alcohol or other substance abuse.   PAST SURGICAL HISTORY:  Her previous surgical history includes  cholecystectomy and had a lumbosacral spinal fusion performed in 1987. She  is gravida 3, para 3.   FAMILY HISTORY:  Positive for mother with diabetes mellitus. She has  multiple brothers and two sisters with no contributory genetically  predisposed illnesses. She has three children that are healthy without  evident disease.   REVIEW OF SYSTEMS:  As under HPI, all else negative.   PHYSICAL EXAMINATION:  SKIN:  Unremarkable.  HEENT:  Head and neck:  No JVD or adenopathy. Neck is supple.  CHEST:  Diffuse sonorous rhonchi in all fields with diffuse wheezing.  CARDIAC:  Regular rhythm without murmurs, gallops, or rubs.  ABDOMEN:  Benign.  NEUROLOGICAL:   Nonfocal.  EXTREMITIES:  Without clubbing, cyanosis, or edema.   IMPRESSION:  1. Acute on chronic exacerbation of asthma with underlying bronchitis. Will     be admitted, rule out pneumonia. IV bronchodilators, IV steroids, and IV     antibiotics. Around-the-clock nebulizer treatments and aggressive     pulmonary toilet.  2. Diabetes mellitus. With steroid use, sugars likely to go up. Will control     with sliding scale. Continue oral home medication.  3. Hypertension. Monitor for exacerbations and treat p.r.n. Continue her     baseline ARB treatment with Diovan.  4. Osteoarthritis, back pain. Monitor, p.r.n. analgesia.     ___________________________________________                                         Madelin Rear. Sherwood Gambler, M.D.  LJF/MEDQ  D:  05/08/2004  T:  05/08/2004  Job:  [id number]

## 2011-02-01 NOTE — Consult Note (Signed)
[facility]  Patient:    [name], [name]                         MRN: [id number] Proc. Date: 01/11/00 Adm. Date:  [id number] Attending:  Thyra Breed CC:         Dr. Carlena Sax - [facility] - [place], [place]                          Consultation Report  INDICATIONS:  [name] comes in for a follow-up evaluation of her chronic low back  pain, on the basis of degenerative joint disease and myofascial pain.  Since her previous evaluation, she has been relatively stable on her current regimen. She is currently taking the Tylox 5/500 q.i.d. with Xanax 1 mg t.i.d.  She does relatively well with this.  She has developed some buckling of her left knee, which I suspect is more related to her back discomfort than intrinsic knee problems.  She has also had surgery on her left foot for an infected great toe, and is on cephalexin for this.  She has no new neurological symptoms today.  Her spirits are good.  PHYSICAL EXAMINATION:  VITAL SIGNS:  Blood pressure 140/74, heart rate 97, respirations 20, O2 saturation 96%.  GENERAL:  Pain level is 6/10.  NEUROLOGIC:  Straight leg raising negative.  Deep tendon reflexes symmetric in he lower extremity.  Motor is unchanged from previously.  IMPRESSION: 1. Low back pain, on the basis of degenerative joint disease and myofascial pain. 2. Intolerance to most long-acting opiates, either characterized by itching    or rashes. 3. Diabetes mellitus, per Dr. Carlena Sax.  DISPOSITION: 1. Continue on Tylox 5/500 one p.o. q.6h. p.r.n. #120, with no refills. 2. Continue on Xanax 1 mg one p.o. t.i.d. #100, with one refill. 3. Follow up with me in eight weeks.  She was advised that she would need    to let us know when she needs another prescription for her Tylox. DD:  01/11/00 TD:  01/12/00 Job: [id number]

## 2011-02-01 NOTE — H&P (Signed)
[facility]  Patient:    [name], [name]                         MRN: [id number] Adm. Date:  [id number] Attending:  Thyra Breed CC:         Dr. Sherwood Gambler; [place]   History and Physical  Gaul comes in for followup evaluation of her chronic low back pain on the basis of facette joint syndrome on the basis of lumbar spondylosis status post surgical intervention.  Since her last evaluation she stated that she has had a panic attack for which she was evaluated by the cardiologist and had an apparently negative stress test.  Her back continues to bother her whenever she walks, stands, or tries to do the dishes and she has associated bilateral knee discomfort.  She localizes the pain predominantly to the right lower facette regions.  She does note that the Tylox with the Xanax tend to be helping her quite significantly.  She states she feels some better overall.  She continues on her Amaryl.  PHYSICAL EXAMINATION  VITAL SIGNS:  Blood pressure 162/77, heart rate 92, respiratory rate 20, O2 saturation 97%, pain level 6/10.  EXTREMITIES:  Straight leg raise signs are negative.  Deep tendon reflexes were symmetric.  She is tender over her right lower facette regions.  IMPRESSION: 1. Low back pain on the basis of facette joint syndrome with underlying lumbar    spondylosis status post surgical intervention. 2. Other medical problems per Dr. Sherwood Gambler in [place].  DISPOSITION: 1. Continue on Tylox 5/500 one to two p.o. q.6h. p.r.n. #150 with no refill. 2. Xanax 1 mg one p.o. q.8h. p.r.n. #90 with two refills. 3. Follow-up with me in eight weeks. 4. She is to call me in about three weeks to get another prescription for the    Tylox. DD:  02/05/01 TD:  02/05/01 Job: [id number]

## 2011-02-01 NOTE — Discharge Summary (Signed)
   NAME:  [name], [name]                            ACCOUNT NO.:  [id number]   MEDICAL RECORD NO.:  [id number]                   PATIENT TYPE:  INP   LOCATION:  A211                                 FACILITY:  [facility]   PHYSICIAN:  Kirk Ruths, M.D.            DATE OF BIRTH:  02/27/1951   DATE OF ADMISSION:  04/11/2002  DATE OF DISCHARGE:  04/12/2002                                 DISCHARGE SUMMARY   DISCHARGE DIAGNOSES:  1. Atypical chest pain.  2. Insulin-dependent diabetes.  3. Hypertension.  4. Hyperlipidemia.  5. Chronic back pain.   HOSPITAL COURSE:  This 60 year old white female had been having some  recurrent sharp-type chest pains in her mid chest.  The patient had some  pains with sweating, nausea, and shortness of breath associated with it.  She was seen in the emergency room.  Vital signs were stable.  EKG showed no  changes.  Initial cardiac enzymes were negative.  The patient's white count  was 9000, hemoglobin 14.5, D-dimer was negative.  Electrolytes within normal  range.  BUN and creatinine 7.8.  The patient was admitted to ________ care  telemetry.  Serial enzymes and troponins were negative.  The patient's chest  wall was slightly tender.  The patient's TSH was 3.  Hemoglobin A1C 10.  The  patient's diabetes recovered with sliding scales.  Blood pressure was  controlled through her stay.  EKG and telemetry showed no arrhythmias.  It  was felt this was not cardiac-type chest pain, atypical.  She was stable at  time of discharge.  We will have [facility] see her the day  before she leaves.  She is stable.  She will continue on previous  medications.                                                Kirk Ruths, M.D.    WMM/MEDQ  D:  04/12/2002  T:  04/18/2002  Job:  [id number]

## 2011-02-01 NOTE — Procedures (Signed)
[facility]  Patient:    [name], [name] Visit Number: [id number] MRN: [id number]          Service Type: EMS Location: ED Attending Physician:  Corlis Leak. Dictated by:   Kari Baars, M.D. Proc. Date: 04/11/02 Admit Date:  03/09/2002 Discharge Date: 03/09/2002                            EKG Interpretations  INTERPRETATION:  The rhythm is sinus rhythm with a rate in the 80s.  ASSESSMENT:  Normal electrocardiogram. Dictated by:   Kari Baars, M.D. Attending Physician:  Corlis Leak DD:  04/11/02 TD:  04/13/02 Job: [id number]

## 2011-06-14 LAB — CBC
Hemoglobin: 12.9
MCHC: 33.5
MCHC: 33.6
Platelets: 267
Platelets: 297
RBC: 4.39
RBC: 3.55 — ABNORMAL LOW
RDW: 13.6
RDW: 14
WBC: 7.2

## 2011-06-14 LAB — APTT: aPTT: 31

## 2011-06-14 LAB — LIPID PANEL: HDL: 27 — ABNORMAL LOW

## 2011-06-14 LAB — COMPREHENSIVE METABOLIC PANEL
ALT: 13
AST: 17
Albumin: 3.3 — ABNORMAL LOW
Alkaline Phosphatase: 64
GFR calc Af Amer: >60
Potassium: 3.5
Sodium: 138
Total Protein: 5.8 — ABNORMAL LOW

## 2011-06-14 LAB — DIFFERENTIAL
Basophils Absolute: 0.2 — ABNORMAL HIGH
Lymphocytes Relative: 42
Neutro Abs: 4.7
Neutrophils Relative %: 47

## 2011-06-14 LAB — BASIC METABOLIC PANEL
BUN: 8
CO2: 29
CO2: 30
Calcium: 9.3
Calcium: 9.4
Calcium: 8.8
Creatinine, Ser: 0.82
GFR calc Af Amer: >60
GFR calc Af Amer: >60
GFR calc non Af Amer: >60
GFR calc non Af Amer: >60
Glucose, Bld: 182 — ABNORMAL HIGH
Sodium: 139

## 2011-06-14 LAB — CARDIAC PANEL(CRET KIN+CKTOT+MB+TROPI)
CK, MB: 1.7
Relative Index: 1.4
Troponin I: <0.01

## 2011-06-14 LAB — VITAMIN B12: Vitamin B-12: 645 (ref 211–911)

## 2011-06-14 LAB — TYPE AND SCREEN: Antibody Screen: NEGATIVE

## 2011-06-14 LAB — PROTIME-INR
INR: 0.9
INR: 1
Prothrombin Time: 12.6
Prothrombin Time: 13.5

## 2011-06-14 LAB — TSH: TSH: 1.633

## 2011-06-14 LAB — HOMOCYSTEINE: Homocysteine: 13.3

## 2011-06-14 LAB — SEDIMENTATION RATE: Sed Rate: 20

## 2011-06-19 LAB — BASIC METABOLIC PANEL
BUN: 15
Calcium: 9.3
Chloride: 95 — ABNORMAL LOW
Creatinine, Ser: 1.09
GFR calc non Af Amer: 52 — ABNORMAL LOW

## 2011-06-19 LAB — CBC
MCV: 86.7
Platelets: 305
WBC: 14.3 — ABNORMAL HIGH

## 2011-06-19 LAB — URINALYSIS, ROUTINE W REFLEX MICROSCOPIC
Glucose, UA: NEGATIVE
Leukocytes, UA: NEGATIVE
Protein, ur: NEGATIVE
Urobilinogen, UA: 0.2

## 2011-06-19 LAB — DIFFERENTIAL
Eosinophils Absolute: 0.1
Lymphs Abs: 4.2 — ABNORMAL HIGH
Neutrophils Relative %: 61

## 2011-06-19 LAB — URINE CULTURE
Colony Count: NO GROWTH
Culture: NO GROWTH

## 2011-11-21 ENCOUNTER — Other Ambulatory Visit (HOSPITAL_COMMUNITY): Payer: Self-pay | Admitting: Family Medicine

## 2011-11-27 ENCOUNTER — Ambulatory Visit (HOSPITAL_COMMUNITY)
Admission: RE | Admit: 2011-11-27 | Discharge: 2011-11-27 | Disposition: A | Discharge status: Home / Self Care | Payer: Medicare Other | Source: Ambulatory Visit | Attending: Family Medicine | Admitting: Family Medicine

## 2011-11-27 DIAGNOSIS — R229 Localized swelling, mass and lump, unspecified: Secondary | ICD-10-CM | POA: Insufficient documentation

## 2011-11-27 MED ORDER — IOHEXOL 300 MG/ML  SOLN
75.0000 mL | Freq: Once | INTRAMUSCULAR | Status: AC | PRN
  Administered 2011-11-27: 75 mL via INTRAVENOUS

## 2012-07-17 ENCOUNTER — Emergency Department (HOSPITAL_COMMUNITY): Payer: Medicare Other

## 2012-07-17 ENCOUNTER — Emergency Department (HOSPITAL_COMMUNITY)
Admission: EM | Admit: 2012-07-17 | Discharge: 2012-07-17 | Disposition: A | Discharge status: Home / Self Care | Payer: Medicare Other | Attending: Emergency Medicine | Admitting: Emergency Medicine

## 2012-07-17 ENCOUNTER — Encounter (HOSPITAL_COMMUNITY): Payer: Self-pay | Admitting: Emergency Medicine

## 2012-07-17 DIAGNOSIS — Z880 Allergy status to penicillin: Secondary | ICD-10-CM | POA: Insufficient documentation

## 2012-07-17 DIAGNOSIS — J4489 Other specified chronic obstructive pulmonary disease: Secondary | ICD-10-CM | POA: Insufficient documentation

## 2012-07-17 DIAGNOSIS — N390 Urinary tract infection, site not specified: Secondary | ICD-10-CM | POA: Insufficient documentation

## 2012-07-17 DIAGNOSIS — E119 Type 2 diabetes mellitus without complications: Secondary | ICD-10-CM | POA: Insufficient documentation

## 2012-07-17 DIAGNOSIS — R110 Nausea: Secondary | ICD-10-CM | POA: Insufficient documentation

## 2012-07-17 DIAGNOSIS — Z79899 Other long term (current) drug therapy: Secondary | ICD-10-CM | POA: Insufficient documentation

## 2012-07-17 DIAGNOSIS — M549 Dorsalgia, unspecified: Secondary | ICD-10-CM

## 2012-07-17 DIAGNOSIS — J449 Chronic obstructive pulmonary disease, unspecified: Secondary | ICD-10-CM | POA: Insufficient documentation

## 2012-07-17 DIAGNOSIS — F17200 Nicotine dependence, unspecified, uncomplicated: Secondary | ICD-10-CM | POA: Insufficient documentation

## 2012-07-17 DIAGNOSIS — M545 Low back pain, unspecified: Secondary | ICD-10-CM | POA: Insufficient documentation

## 2012-07-17 DIAGNOSIS — I10 Essential (primary) hypertension: Secondary | ICD-10-CM | POA: Insufficient documentation

## 2012-07-17 DIAGNOSIS — Z8673 Personal history of transient ischemic attack (TIA), and cerebral infarction without residual deficits: Secondary | ICD-10-CM | POA: Insufficient documentation

## 2012-07-17 HISTORY — DX: Cerebral infarction, unspecified: I63.9

## 2012-07-17 HISTORY — DX: Essential (primary) hypertension: I10

## 2012-07-17 HISTORY — DX: Chronic obstructive pulmonary disease, unspecified: J44.9

## 2012-07-17 LAB — URINALYSIS, ROUTINE W REFLEX MICROSCOPIC
Glucose, UA: 500 mg/dL — AB
Leukocytes, UA: NEGATIVE
Protein, ur: NEGATIVE mg/dL
Urobilinogen, UA: 0.2 mg/dL (ref 0.0–1.0)

## 2012-07-17 LAB — BASIC METABOLIC PANEL
CO2: 29 mEq/L (ref 19–32)
Calcium: 10.2 mg/dL (ref 8.4–10.5)
GFR calc Af Amer: >90 mL/min (ref >90)
GFR calc non Af Amer: 88 mL/min — ABNORMAL LOW (ref >90)
Sodium: 133 mEq/L — ABNORMAL LOW (ref 135–145)

## 2012-07-17 LAB — URINE MICROSCOPIC-ADD ON

## 2012-07-17 LAB — CBC WITH DIFFERENTIAL/PLATELET
Lymphocytes Relative: 49 % — ABNORMAL HIGH (ref 12–46)
Lymphs Abs: 5.2 10*3/uL — ABNORMAL HIGH (ref 0.7–4.0)
Neutrophils Relative %: 41 % — ABNORMAL LOW (ref 43–77)
Platelets: 218 10*3/uL (ref 150–400)
RBC: 5.86 MIL/uL — ABNORMAL HIGH (ref 3.87–5.11)
WBC: 10.6 10*3/uL — ABNORMAL HIGH (ref 4.0–10.5)

## 2012-07-17 MED ORDER — ONDANSETRON HCL 4 MG/2ML IJ SOLN
4.0000 mg | Freq: Once | INTRAMUSCULAR | Status: AC
  Administered 2012-07-17: 4 mg via INTRAVENOUS
  Filled 2012-07-17: qty 2

## 2012-07-17 MED ORDER — HYDROMORPHONE HCL PF 1 MG/ML IJ SOLN
1.0000 mg | Freq: Once | INTRAMUSCULAR | Status: AC
  Administered 2012-07-17: 1 mg via INTRAVENOUS
  Filled 2012-07-17: qty 1

## 2012-07-17 MED ORDER — CIPROFLOXACIN IN D5W 400 MG/200ML IV SOLN
400.0000 mg | Freq: Once | INTRAVENOUS | Status: AC
  Administered 2012-07-17: 400 mg via INTRAVENOUS
  Filled 2012-07-17: qty 200

## 2012-07-17 MED ORDER — SODIUM CHLORIDE 0.9 % IV SOLN
Freq: Once | INTRAVENOUS | Status: AC
  Administered 2012-07-17: 02:00:00 via INTRAVENOUS

## 2012-07-17 MED ORDER — SODIUM CHLORIDE 0.9 % IV BOLUS (SEPSIS)
1000.0000 mL | Freq: Once | INTRAVENOUS | Status: AC
  Administered 2012-07-17: 1000 mL via INTRAVENOUS

## 2012-07-17 MED ORDER — PROMETHAZINE HCL 25 MG PO TABS: 25.0000 mg | ORAL_TABLET | Freq: Four times a day (QID) | ORAL | Status: DC | PRN

## 2012-07-17 MED ORDER — LORAZEPAM 2 MG/ML IJ SOLN
1.0000 mg | Freq: Once | INTRAMUSCULAR | Status: AC
  Administered 2012-07-17: 1 mg via INTRAVENOUS
  Filled 2012-07-17: qty 1

## 2012-07-17 MED ORDER — ACETAMINOPHEN 325 MG PO TABS
650.0000 mg | ORAL_TABLET | Freq: Once | ORAL | Status: AC
  Administered 2012-07-17: 650 mg via ORAL
  Filled 2012-07-17: qty 2

## 2012-07-17 MED ORDER — CIPROFLOXACIN HCL 500 MG PO TABS: 500.0000 mg | ORAL_TABLET | Freq: Two times a day (BID) | ORAL | Status: DC

## 2012-07-17 MED ORDER — KETOROLAC TROMETHAMINE 30 MG/ML IJ SOLN
30.0000 mg | Freq: Once | INTRAMUSCULAR | Status: AC
  Administered 2012-07-17: 30 mg via INTRAVENOUS
  Filled 2012-07-17: qty 1

## 2012-07-17 NOTE — ED Notes (Signed)
Patient c/o low back pain with pelvic pressure that woke her from sleep.

## 2012-07-17 NOTE — ED Notes (Signed)
Pt reporting pain in pelvic area and lower back that woke her from sleep.  States pain woke her about 30 min ago.  Also reporting mild nausea.

## 2012-07-17 NOTE — ED Notes (Signed)
Pt ambulated independently  to BR with no difficulty.

## 2012-07-17 NOTE — ED Provider Notes (Signed)
History     CSN: [id number]  Arrival date & time 07/17/12  0105   First MD Initiated Contact with Patient 07/17/12 0140      Chief Complaint  Patient presents with  . Pelvic Pain  . Back Pain    (Consider location/radiation/quality/duration/timing/severity/associated sxs/prior treatment) HPI Hx per PT, at home sleeping tonight and woke with severe, sharp and pressure like L low back and flank pain radiating to LLQ ABD. Had associated nausea no vomiting, no h/o kidney stones, does have back pain that she takes tylox for.  She had not required taking any today. No associated F/C. No rash, no trauma. No hematuria. No known agrevating or alleviating factors Past Medical History  Diagnosis Date  . Hypertension   . Diabetes mellitus without complication   . COPD (chronic obstructive pulmonary disease)   . Stroke     History reviewed. No pertinent past surgical history.  No family history on file.  History  Substance Use Topics  . Smoking status: Current Every Day Smoker  . Smokeless tobacco: Not on file  . Alcohol Use: Yes    OB History    Grav Para Term Preterm Abortions TAB SAB Ect Mult Living                  Review of Systems  Constitutional: Negative for fever and chills.  HENT: Negative for neck pain and neck stiffness.   Eyes: Negative for visual disturbance.  Respiratory: Negative for shortness of breath.   Cardiovascular: Negative for chest pain.  Gastrointestinal: Positive for nausea and vomiting.  Genitourinary: Negative for dysuria.  Musculoskeletal: Positive for back pain.  Skin: Negative for rash.  Neurological: Negative for headaches.  All other systems reviewed and are negative.    Allergies  Penicillins  Home Medications   Current Outpatient Rx  Name Route Sig Dispense Refill  . ALPRAZOLAM 0.25 MG PO TABS Oral Take 0.25 mg by mouth 4 (four) times daily as needed.    Marland Kitchen LISINOPRIL 20 MG PO TABS Oral Take 20 mg by mouth daily.    Marland Kitchen METFORMIN  HCL 500 MG PO TABS Oral Take 500 mg by mouth 2 (two) times daily with a meal.    . METHOCARBAMOL 750 MG PO TABS Oral Take 750 mg by mouth at bedtime.    Marland Kitchen SAXAGLIPTIN HCL 2.5 MG PO TABS Oral Take 5 mg by mouth daily.      BP 179/118  Pulse 145  Temp 98 F (36.7 C)  Resp 22  Ht 5\' 6"  (1.676 m)  Wt 180 lb (81.647 kg)  BMI 29.05 kg/m2  SpO2 98%  Physical Exam  Constitutional: She is oriented to person, place, and time. She appears well-developed and well-nourished.  HENT:  Head: Normocephalic and atraumatic.  Eyes: EOM are normal. Pupils are equal, round, and reactive to light.  Neck: Neck supple.  Cardiovascular: Regular rhythm and intact distal pulses.        tachycardic  Pulmonary/Chest: Effort normal. No respiratory distress.  Abdominal: Soft. Bowel sounds are normal. She exhibits no distension. There is no tenderness. There is no rebound and no guarding.       No CVAT  Musculoskeletal: Normal range of motion. She exhibits no edema.       No midline or paralumbar tenderness  Neurological: She is alert and oriented to person, place, and time.  Skin: Skin is warm and dry.    ED Course  Procedures (including critical care time)  Results for  orders placed during the hospital encounter of 07/17/12  URINALYSIS, ROUTINE W REFLEX MICROSCOPIC      Component Value Range   Color, Urine YELLOW  YELLOW   APPearance CLEAR  CLEAR   Specific Gravity, Urine 1.025  1.005 - 1.030   pH 6.0  5.0 - 8.0   Glucose, UA 500 (*) NEGATIVE mg/dL   Hgb urine dipstick NEGATIVE  NEGATIVE   Bilirubin Urine NEGATIVE  NEGATIVE   Ketones, ur NEGATIVE  NEGATIVE mg/dL   Protein, ur NEGATIVE  NEGATIVE mg/dL   Urobilinogen, UA 0.2  0.0 - 1.0 mg/dL   Nitrite POSITIVE (*) NEGATIVE   Leukocytes, UA NEGATIVE  NEGATIVE  BASIC METABOLIC PANEL      Component Value Range   Sodium 133 (*) 135 - 145 mEq/L   Potassium 3.8  3.5 - 5.1 mEq/L   Chloride 94 (*) 96 - 112 mEq/L   CO2 29  19 - 32 mEq/L   Glucose, Bld  252 (*) 70 - 99 mg/dL   BUN 11  6 - 23 mg/dL   Creatinine, Ser 1.61  0.50 - 1.10 mg/dL   Calcium 09.6  8.4 - 04.5 mg/dL   GFR calc non Af Amer 88 (*) >90 mL/min   GFR calc Af Amer >90  >90 mL/min  CBC WITH DIFFERENTIAL      Component Value Range   WBC 10.6 (*) 4.0 - 10.5 K/uL   RBC 5.86 (*) 3.87 - 5.11 MIL/uL   Hemoglobin 18.6 (*) 12.0 - 15.0 g/dL   HCT 40.9 (*) 81.1 - 91.4 %   MCV 91.6  78.0 - 100.0 fL   MCH 31.7  26.0 - 34.0 pg   MCHC 34.6  30.0 - 36.0 g/dL   RDW 78.2  95.6 - 21.3 %   Platelets 218  150 - 400 K/uL   Neutrophils Relative 41 (*) 43 - 77 %   Neutro Abs 4.3  1.7 - 7.7 K/uL   Lymphocytes Relative 49 (*) 12 - 46 %   Lymphs Abs 5.2 (*) 0.7 - 4.0 K/uL   Monocytes Relative 9  3 - 12 %   Monocytes Absolute 1.0  0.1 - 1.0 K/uL   Eosinophils Relative 1  0 - 5 %   Eosinophils Absolute 0.1  0.0 - 0.7 K/uL   Basophils Relative 0  0 - 1 %   Basophils Absolute 0.0  0.0 - 0.1 K/uL  URINE MICROSCOPIC-ADD ON      Component Value Range   Squamous Epithelial / LPF RARE  RARE   WBC, UA 0-2  <3 WBC/hpf   Bacteria, UA MANY (*) RARE   Ct Abdomen Pelvis Wo Contrast  07/17/2012  *RADIOLOGY REPORT*  Clinical Data: Low back and pelvic pain.  CT ABDOMEN AND PELVIS WITHOUT CONTRAST  Technique:  Multidetector CT imaging of the abdomen and pelvis was performed following the standard protocol without intravenous contrast.  Comparison: CT abdomen and pelvis 09/03/2010.  Findings: Punctate calcified granuloma is seen in the left lower lobe.  Lung bases otherwise clear.  No pleural or pericardial effusion.  The kidneys have a normal uninfused appearance bilaterally.  There are no renal or ureteral stones.  The patient is status post cholecystectomy.  The liver, adrenal glands and pancreas appear normal.  Calcification along the capsular surface of the spleen is compatible with some prior insult such as trauma.  Uterus, adnexa and urinary bladder are unremarkable.  The stomach, small and large bowel  and appendix appear normal.  There is no lymphadenopathy or fluid.  Bones demonstrate postoperative change lower lumbar fusion. No lytic or sclerotic lesion.  IMPRESSION: Negative for urinary tract stone.  No acute finding.   Original Report Authenticated By: Holley Dexter, M.D.     IVFs. IV Zorfan.  IV Dilaudid  Labs and UA reviewed, CT scan obtained.   IV ABX for Nit POS UTI. possible ealry pyelo.   2:52 AM recheck improving pains, no emesis. Plan d/c home ABX and close PCP follow up with UTI/ Pyelo precautions verbalized as understood. Recheck HR improved, tolerates PO fluids and requesting to be discharged home.        MDM   Back pain and pelvic pressure with nausea and UTI/ early pyleo, treated IV Cipro and improved with zofran, IVFs and IV narcotics, CT scan stone study reviewed as above. VS and Nursing notes reviewed.        Sunnie Nielsen, MD 07/17/12 [phone]

## 2012-07-17 NOTE — ED Notes (Signed)
Pt resting.  Reporting relief from pain.

## 2012-07-17 NOTE — ED Notes (Signed)
Pt placed on 2lpm O2.  Pt reports she does have an inhaler and nebulizer at home, pt refuses nebulizer treatment.  Lung sounds diminished.  O2 sats increased to 95% on oxygen.

## 2012-07-20 LAB — URINE CULTURE

## 2012-07-22 ENCOUNTER — Encounter: Payer: Self-pay | Admitting: Vascular Surgery

## 2012-12-20 ENCOUNTER — Encounter (HOSPITAL_COMMUNITY): Payer: Self-pay | Admitting: Emergency Medicine

## 2012-12-20 ENCOUNTER — Emergency Department (HOSPITAL_COMMUNITY)
Admission: EM | Admit: 2012-12-20 | Discharge: 2012-12-20 | Disposition: A | Discharge status: Home / Self Care | Payer: Medicare Other | Attending: Emergency Medicine | Admitting: Emergency Medicine

## 2012-12-20 ENCOUNTER — Emergency Department (HOSPITAL_COMMUNITY): Payer: Medicare Other

## 2012-12-20 DIAGNOSIS — I10 Essential (primary) hypertension: Secondary | ICD-10-CM | POA: Insufficient documentation

## 2012-12-20 DIAGNOSIS — D751 Secondary polycythemia: Secondary | ICD-10-CM

## 2012-12-20 DIAGNOSIS — Z8673 Personal history of transient ischemic attack (TIA), and cerebral infarction without residual deficits: Secondary | ICD-10-CM | POA: Insufficient documentation

## 2012-12-20 DIAGNOSIS — E1169 Type 2 diabetes mellitus with other specified complication: Secondary | ICD-10-CM | POA: Insufficient documentation

## 2012-12-20 DIAGNOSIS — Z9089 Acquired absence of other organs: Secondary | ICD-10-CM | POA: Insufficient documentation

## 2012-12-20 DIAGNOSIS — F17200 Nicotine dependence, unspecified, uncomplicated: Secondary | ICD-10-CM | POA: Insufficient documentation

## 2012-12-20 DIAGNOSIS — R6883 Chills (without fever): Secondary | ICD-10-CM | POA: Insufficient documentation

## 2012-12-20 DIAGNOSIS — J4489 Other specified chronic obstructive pulmonary disease: Secondary | ICD-10-CM | POA: Insufficient documentation

## 2012-12-20 DIAGNOSIS — J449 Chronic obstructive pulmonary disease, unspecified: Secondary | ICD-10-CM | POA: Insufficient documentation

## 2012-12-20 DIAGNOSIS — D45 Polycythemia vera: Secondary | ICD-10-CM | POA: Insufficient documentation

## 2012-12-20 DIAGNOSIS — R112 Nausea with vomiting, unspecified: Secondary | ICD-10-CM | POA: Insufficient documentation

## 2012-12-20 DIAGNOSIS — Z79899 Other long term (current) drug therapy: Secondary | ICD-10-CM | POA: Insufficient documentation

## 2012-12-20 DIAGNOSIS — D72829 Elevated white blood cell count, unspecified: Secondary | ICD-10-CM

## 2012-12-20 DIAGNOSIS — M545 Low back pain, unspecified: Secondary | ICD-10-CM | POA: Insufficient documentation

## 2012-12-20 DIAGNOSIS — G8929 Other chronic pain: Secondary | ICD-10-CM | POA: Insufficient documentation

## 2012-12-20 DIAGNOSIS — IMO0001 Reserved for inherently not codable concepts without codable children: Secondary | ICD-10-CM | POA: Insufficient documentation

## 2012-12-20 DIAGNOSIS — R739 Hyperglycemia, unspecified: Secondary | ICD-10-CM

## 2012-12-20 DIAGNOSIS — R197 Diarrhea, unspecified: Secondary | ICD-10-CM | POA: Insufficient documentation

## 2012-12-20 DIAGNOSIS — R109 Unspecified abdominal pain: Secondary | ICD-10-CM | POA: Insufficient documentation

## 2012-12-20 LAB — URINALYSIS, ROUTINE W REFLEX MICROSCOPIC
Ketones, ur: 15 mg/dL — AB
Leukocytes, UA: NEGATIVE
Nitrite: NEGATIVE
Protein, ur: 30 mg/dL — AB
Specific Gravity, Urine: 1.025 (ref 1.005–1.030)

## 2012-12-20 LAB — CBC WITH DIFFERENTIAL/PLATELET
Basophils Absolute: 0 10*3/uL (ref 0.0–0.1)
Basophils Relative: 0 % (ref 0–1)
Eosinophils Relative: 0 % (ref 0–5)
HCT: 51.8 % — ABNORMAL HIGH (ref 36.0–46.0)
Hemoglobin: 18.1 g/dL — ABNORMAL HIGH (ref 12.0–15.0)
Lymphocytes Relative: 10 % — ABNORMAL LOW (ref 12–46)
Monocytes Relative: 8 % (ref 3–12)
Neutro Abs: 15.1 10*3/uL — ABNORMAL HIGH (ref 1.7–7.7)
RBC: 5.66 MIL/uL — ABNORMAL HIGH (ref 3.87–5.11)
WBC: 18.4 10*3/uL — ABNORMAL HIGH (ref 4.0–10.5)

## 2012-12-20 LAB — URINE MICROSCOPIC-ADD ON

## 2012-12-20 LAB — COMPREHENSIVE METABOLIC PANEL
ALT: 13 U/L (ref 0–35)
Alkaline Phosphatase: 93 U/L (ref 39–117)
CO2: 31 mEq/L (ref 19–32)
Chloride: 94 mEq/L — ABNORMAL LOW (ref 96–112)
GFR calc Af Amer: >90 mL/min (ref >90)
Glucose, Bld: 307 mg/dL — ABNORMAL HIGH (ref 70–99)
Potassium: 3.6 mEq/L (ref 3.5–5.1)
Sodium: 139 mEq/L (ref 135–145)
Total Protein: 8.1 g/dL (ref 6.0–8.3)

## 2012-12-20 MED ORDER — HYDROMORPHONE HCL PF 1 MG/ML IJ SOLN
1.0000 mg | Freq: Once | INTRAMUSCULAR | Status: AC
  Administered 2012-12-20: 1 mg via INTRAVENOUS
  Filled 2012-12-20: qty 1

## 2012-12-20 MED ORDER — SODIUM CHLORIDE 0.9 % IV SOLN
Freq: Once | INTRAVENOUS | Status: AC
  Administered 2012-12-20: 10:00:00 via INTRAVENOUS

## 2012-12-20 MED ORDER — SODIUM CHLORIDE 0.9 % IV BOLUS (SEPSIS)
1000.0000 mL | Freq: Once | INTRAVENOUS | Status: AC
  Administered 2012-12-20: 1000 mL via INTRAVENOUS

## 2012-12-20 MED ORDER — ONDANSETRON HCL 4 MG/2ML IJ SOLN
4.0000 mg | Freq: Once | INTRAMUSCULAR | Status: AC
  Administered 2012-12-20: 4 mg via INTRAVENOUS
  Filled 2012-12-20: qty 2

## 2012-12-20 NOTE — ED Provider Notes (Signed)
History    This chart was scribed for Debra Booze, MD, by Frederik Pear, ED scribe. The patient was seen in room APA12/APA12 and the patient's care was started at 0837.    CSN: [id number]  Arrival date & time 12/20/12  0827   First MD Initiated Contact with Patient 12/20/12 [phone]      Chief Complaint  Patient presents with  . Emesis  . Diarrhea    (Consider location/radiation/quality/duration/timing/severity/associated sxs/prior treatment) The history is provided by the patient, the spouse and medical records. No language interpreter was used.    [name] is a 62 y.o. female with a h/o of chronic back pain, hypertension, DM, and COPD who presents to the Emergency Department complaining of sudden onset, intermittent, moderate emesis with associated nausea, diarrhea, worse than baseline lower back pain, and right flank pain that is aggravated to 10/10 by laying down as well as taking deep breaths, and chills that began yesterday. She denies dysuria, fever, abdominal pain, or urinary frequency, but her husband states that her frequency has increased. She reports that she took one 25 mg Phenergan and one Percocet at home, which provided no relief for her symptoms.  PCP is Dr. Janna Arch  Past Medical History  Diagnosis Date  . Hypertension   . Diabetes mellitus without complication   . COPD (chronic obstructive pulmonary disease)   . Stroke   . Chronic back pain     Past Surgical History  Procedure Laterality Date  . Back fusion    . Cholecystectomy    . Foot surgery Bilateral     Family History  Problem Relation Age of Onset  . Cancer Father     History  Substance Use Topics  . Smoking status: Current Every Day Smoker -- 1.00 packs/day for 40 years    Types: Cigarettes  . Smokeless tobacco: Never Used  . Alcohol Use: No    OB History   Grav Para Term Preterm Abortions TAB SAB Ect Mult Living   3 3 3       3       Review of Systems  Constitutional: Positive for  chills.  Gastrointestinal: Positive for nausea, vomiting and diarrhea. Negative for abdominal pain.  Genitourinary: Negative for dysuria.  Musculoskeletal: Positive for myalgias (right flank) and back pain.  All other systems reviewed and are negative.    Allergies  Penicillins  Home Medications   Current Outpatient Rx  Name  Route  Sig  Dispense  Refill  . ALPRAZolam (XANAX) 0.25 MG tablet   Oral   Take 0.25 mg by mouth 4 (four) times daily as needed.         . ciprofloxacin (CIPRO) 500 MG tablet   Oral   Take 1 tablet (500 mg total) by mouth 2 (two) times daily.   28 tablet   0   . lisinopril (PRINIVIL,ZESTRIL) 20 MG tablet   Oral   Take 20 mg by mouth daily.         . metFORMIN (GLUCOPHAGE) 500 MG tablet   Oral   Take 500 mg by mouth 2 (two) times daily with a meal.         . methocarbamol (ROBAXIN) 750 MG tablet   Oral   Take 750 mg by mouth at bedtime.         . promethazine (PHENERGAN) 25 MG tablet   Oral   Take 1 tablet (25 mg total) by mouth every 6 (six) hours as needed for nausea.  30 tablet   0   . saxagliptin HCl (ONGLYZA) 2.5 MG TABS tablet   Oral   Take 5 mg by mouth daily.           BP 188/75  Pulse 91  Temp(Src) 98.5 F (36.9 C) (Oral)  Resp 26  Ht 5\' 7"  (1.702 m)  Wt 170 lb (77.111 kg)  BMI 26.62 kg/m2  SpO2 94%  Physical Exam  Nursing note and vitals reviewed. Constitutional: She is oriented to person, place, and time. She appears well-developed and well-nourished. No distress.  Appears uncomfortable.  HENT:  Head: Normocephalic and atraumatic.  Neck: Normal range of motion. Neck supple.  Cardiovascular: Regular rhythm and normal heart sounds.   No murmur heard. Pulmonary/Chest: Effort normal and breath sounds normal. No respiratory distress.  Abdominal: Soft. Normal appearance. Bowel sounds are decreased. There is no tenderness.  Musculoskeletal: Normal range of motion. She exhibits tenderness. She exhibits no edema.   Moderate right CVA tenderness.  Neurological: She is alert and oriented to person, place, and time. Coordination normal.  Skin: Skin is warm and dry. No rash noted. She is not diaphoretic.  Psychiatric: She has a normal mood and affect. Her behavior is normal. Judgment and thought content normal.    ED Course  Procedures (including critical care time)  DIAGNOSTIC STUDIES: Oxygen Saturation is 94% on room air, adequate by my interpretation.    COORDINATION OF CARE:  08:45- Discussed planned course of treatment with the patient, including IV fluids, Dilaudid, Zofran, abdominal CT, blood work, and a UA, who is agreeable at this time.  09:00- Medication Orders- sodium chloride 0.9% bolus, hydromorphone (dilaudid) injection 1 mg- once, ondansetron (zofran) injection 4 mg- once.  12:21- Recheck- She is feeling better and is ready for discharge.  Results for orders placed during the hospital encounter of 12/20/12  CBC WITH DIFFERENTIAL      Result Value Range   WBC 18.4 (*) 4.0 - 10.5 K/uL   RBC 5.66 (*) 3.87 - 5.11 MIL/uL   Hemoglobin 18.1 (*) 12.0 - 15.0 g/dL   HCT 29.5 (*) 28.4 - 13.2 %   MCV 91.5  78.0 - 100.0 fL   MCH 32.0  26.0 - 34.0 pg   MCHC 34.9  30.0 - 36.0 g/dL   RDW 44.0  10.2 - 72.5 %   Platelets 225  150 - 400 K/uL   Neutrophils Relative 82 (*) 43 - 77 %   Lymphocytes Relative 10 (*) 12 - 46 %   Monocytes Relative 8  3 - 12 %   Eosinophils Relative 0  0 - 5 %   Basophils Relative 0  0 - 1 %   Neutro Abs 15.1 (*) 1.7 - 7.7 K/uL   Lymphs Abs 1.8  0.7 - 4.0 K/uL   Monocytes Absolute 1.5 (*) 0.1 - 1.0 K/uL   Eosinophils Absolute 0.0  0.0 - 0.7 K/uL   Basophils Absolute 0.0  0.0 - 0.1 K/uL   WBC Morphology MILD LEFT SHIFT (1-5% METAS, OCC MYELO, OCC BANDS)     Smear Review LARGE PLATELETS PRESENT    COMPREHENSIVE METABOLIC PANEL      Result Value Range   Sodium 139  135 - 145 mEq/L   Potassium 3.6  3.5 - 5.1 mEq/L   Chloride 94 (*) 96 - 112 mEq/L   CO2 31  19 -  32 mEq/L   Glucose, Bld 307 (*) 70 - 99 mg/dL   BUN 11  6 - 23  mg/dL   Creatinine, Ser 1.91  0.50 - 1.10 mg/dL   Calcium 47.8  8.4 - 29.5 mg/dL   Total Protein 8.1  6.0 - 8.3 g/dL   Albumin 4.3  3.5 - 5.2 g/dL   AST 17  0 - 37 U/L   ALT 13  0 - 35 U/L   Alkaline Phosphatase 93  39 - 117 U/L   Total Bilirubin 0.7  0.3 - 1.2 mg/dL   GFR calc non Af Amer >90  >90 mL/min   GFR calc Af Amer >90  >90 mL/min  LIPASE, BLOOD      Result Value Range   Lipase 31  11 - 59 U/L  URINALYSIS, ROUTINE W REFLEX MICROSCOPIC      Result Value Range   Color, Urine YELLOW  YELLOW   APPearance CLEAR  CLEAR   Specific Gravity, Urine 1.025  1.005 - 1.030   pH 5.5  5.0 - 8.0   Glucose, UA >1000 (*) NEGATIVE mg/dL   Hgb urine dipstick SMALL (*) NEGATIVE   Bilirubin Urine SMALL (*) NEGATIVE   Ketones, ur 15 (*) NEGATIVE mg/dL   Protein, ur 30 (*) NEGATIVE mg/dL   Urobilinogen, UA 0.2  0.0 - 1.0 mg/dL   Nitrite NEGATIVE  NEGATIVE   Leukocytes, UA NEGATIVE  NEGATIVE  URINE MICROSCOPIC-ADD ON      Result Value Range   Squamous Epithelial / LPF RARE  RARE   WBC, UA 0-2  <3 WBC/hpf   RBC / HPF 0-2  <3 RBC/hpf     Labs Reviewed  CBC WITH DIFFERENTIAL - Abnormal; Notable for the following:    WBC 18.4 (*)    RBC 5.66 (*)    Hemoglobin 18.1 (*)    HCT 51.8 (*)    Neutrophils Relative 82 (*)    Lymphocytes Relative 10 (*)    Neutro Abs 15.1 (*)    Monocytes Absolute 1.5 (*)    All other components within normal limits  COMPREHENSIVE METABOLIC PANEL - Abnormal; Notable for the following:    Chloride 94 (*)    Glucose, Bld 307 (*)    All other components within normal limits  URINALYSIS, ROUTINE W REFLEX MICROSCOPIC - Abnormal; Notable for the following:    Glucose, UA >1000 (*)    Hgb urine dipstick SMALL (*)    Bilirubin Urine SMALL (*)    Ketones, ur 15 (*)    Protein, ur 30 (*)    All other components within normal limits  LIPASE, BLOOD  URINE MICROSCOPIC-ADD ON   Ct Abdomen Pelvis Wo  Contrast  12/20/2012  *RADIOLOGY REPORT*  Clinical Data: Flank pain  CT ABDOMEN AND PELVIS WITHOUT CONTRAST  Technique:  Multidetector CT imaging of the abdomen and pelvis was performed following the standard protocol without intravenous contrast.  Comparison: 07/17/2012  Findings: Lung bases:  Small bilateral pleural effusions noted. Interlobular septal thickening noted within the lung bases compatible with edema.  No airspace consolidation noted.  Abdomen/pelvis:  There is no focal liver abnormality.  Previous cholecystectomy.  The pancreas appears within normal limits. Normal appearance of the spleen.  There is no biliary dilatation.  Normal appearance of both adrenal glands.  The right kidney appears normal.  The left kidney is normal.  There is no nephrolithiasis or hydronephrosis identified.  No ureterolithiasis or hydroureter identified.  The urinary bladder appears normal.  Uterus and the adnexal structures are normal for patient's age.  The abdominal aorta has a normal caliber.  There  is no aneurysm. Calcified atherosclerotic changes are noted.  Scattered sub centimeter retroperitoneal lymph nodes are identified.  No adenopathy.  There is no pelvic or inguinal adenopathy identified.  The stomach appears normal.  The small bowel loops are within normal limits.  The appendix is normal.  No evidence for acute appendicitis.  Normal appearance of the colon.  Bones/Musculoskeletal:  Review of the visualized bony structures is significant for degenerative disc disease.  The patient is status post fusion of L4-5 and L5-S1.  IMPRESSION: 1.  No acute findings within the abdomen or pelvis. 2.  CHF.   Original Report Authenticated By: Signa Kell, M.D.     1. Nausea vomiting and diarrhea   2. Acute right flank pain   3. Leukocytosis   4. Polycythemia   5. Hyperglycemia       MDM  Nausea, vomiting, diarrhea which presumably is due to a viral gastroenteritis. Flank pain of uncertain cause. Need to evaluate  for possible UTI and possible kidney stone. She's given IV fluids, IV hydromorphone, and IV ondansetron.  Patient got inadequate pain relief with this and she's given a second dose of hydromorphone with good pain relief. Workup is significant for leukocytosis with WBC of 18.4 and 82% neutrophils, and hyperglycemia blood sugar 307. Urinalysis does not have evidence of UTI and CT of the abdomen is negative. Although WBC is significantly elevated, remainder of workup is unremarkable including negative CT of abdomen patient does not appear toxic in any way. It is elected to have her go home and follow up with PCP with provision that she return should her symptoms worsen. She he has prescriptions for Phenergan and Percocet at home which he sees as needed.  I personally performed the services described in this documentation, which was scribed in my presence. The recorded information has been reviewed and is accurate.       Debra Booze, MD 12/20/12 1225

## 2012-12-20 NOTE — ED Notes (Signed)
Patient c/o nausea, vomiting, and diarrhea with right flank pain. Patient reports taking phenergan at home with no relief. Patient also reports chills.

## 2013-08-05 ENCOUNTER — Emergency Department (HOSPITAL_COMMUNITY)
Admission: EM | Admit: 2013-08-05 | Discharge: 2013-08-05 | Disposition: A | Discharge status: Home / Self Care | Payer: Medicare Other | Attending: Emergency Medicine | Admitting: Emergency Medicine

## 2013-08-05 ENCOUNTER — Emergency Department (HOSPITAL_COMMUNITY): Payer: Medicare Other

## 2013-08-05 ENCOUNTER — Encounter (HOSPITAL_COMMUNITY): Payer: Self-pay | Admitting: Emergency Medicine

## 2013-08-05 DIAGNOSIS — Y939 Activity, unspecified: Secondary | ICD-10-CM | POA: Insufficient documentation

## 2013-08-05 DIAGNOSIS — W06XXXA Fall from bed, initial encounter: Secondary | ICD-10-CM | POA: Insufficient documentation

## 2013-08-05 DIAGNOSIS — S82402A Unspecified fracture of shaft of left fibula, initial encounter for closed fracture: Secondary | ICD-10-CM

## 2013-08-05 DIAGNOSIS — S82839A Other fracture of upper and lower end of unspecified fibula, initial encounter for closed fracture: Secondary | ICD-10-CM | POA: Insufficient documentation

## 2013-08-05 DIAGNOSIS — Z8673 Personal history of transient ischemic attack (TIA), and cerebral infarction without residual deficits: Secondary | ICD-10-CM | POA: Insufficient documentation

## 2013-08-05 DIAGNOSIS — G8929 Other chronic pain: Secondary | ICD-10-CM | POA: Insufficient documentation

## 2013-08-05 DIAGNOSIS — F17200 Nicotine dependence, unspecified, uncomplicated: Secondary | ICD-10-CM | POA: Insufficient documentation

## 2013-08-05 DIAGNOSIS — Y9229 Other specified public building as the place of occurrence of the external cause: Secondary | ICD-10-CM | POA: Insufficient documentation

## 2013-08-05 DIAGNOSIS — I10 Essential (primary) hypertension: Secondary | ICD-10-CM | POA: Insufficient documentation

## 2013-08-05 DIAGNOSIS — J449 Chronic obstructive pulmonary disease, unspecified: Secondary | ICD-10-CM | POA: Insufficient documentation

## 2013-08-05 DIAGNOSIS — Z880 Allergy status to penicillin: Secondary | ICD-10-CM | POA: Insufficient documentation

## 2013-08-05 DIAGNOSIS — E119 Type 2 diabetes mellitus without complications: Secondary | ICD-10-CM | POA: Insufficient documentation

## 2013-08-05 DIAGNOSIS — Z79899 Other long term (current) drug therapy: Secondary | ICD-10-CM | POA: Insufficient documentation

## 2013-08-05 DIAGNOSIS — Z9889 Other specified postprocedural states: Secondary | ICD-10-CM | POA: Insufficient documentation

## 2013-08-05 DIAGNOSIS — J4489 Other specified chronic obstructive pulmonary disease: Secondary | ICD-10-CM | POA: Insufficient documentation

## 2013-08-05 MED ORDER — HYDROMORPHONE HCL PF 1 MG/ML IJ SOLN
1.0000 mg | Freq: Once | INTRAMUSCULAR | Status: AC
  Administered 2013-08-05: 1 mg via INTRAMUSCULAR
  Filled 2013-08-05: qty 1

## 2013-08-05 NOTE — ED Notes (Signed)
Pt c/o left leg pain since yesterday. Pt states she fell while walking down steps. Pt reports increased pain when walking or with any other movement. No deformity.

## 2013-08-05 NOTE — ED Notes (Signed)
Fell yesterday tripping over feet.  Reports left leg bent underneath her body.  Seen PCP yesterday with no x-rays and was told to come back in a couple of weeks.  States is unable to walk due to pain.  C/o pain to entire left leg.

## 2013-08-05 NOTE — ED Provider Notes (Signed)
CSN: [id number]     Arrival date & time 08/05/13  1039 History  This chart was scribed for Debra Munch, MD by Quintella Reichert, ED scribe.  This patient was seen in room APA01/APA01 and the patient's care was started at 11:40 AM.   Chief Complaint  Patient presents with  . Fall  . Leg Pain    The history is provided by the patient. No language interpreter was used.    HPI Comments: [name] is a 62 y.o. female with h/o chronic back pain, stroke, COPD, DM, and HTN who presents to the Emergency Department complaining of a left leg injury sustained in a fall yesterday.  Pt reports that she was in her usual state of health until yesterday when was at her doctor's office and slipped and fell out of the bed.  She states that her left leg bent underneath her body and she immediately developed constant moderate-to-severe pain to the entire left leg.  She also states her hips have begun hurting presently.  She denies syncope, focal weakness, or any other associated symptoms.  Pt has a h/o multiple orthopedic surgeries and chronic pain to multiple areas.  PCP: Isabella Stalling, MD    Past Medical History  Diagnosis Date  . Hypertension   . Diabetes mellitus without complication   . COPD (chronic obstructive pulmonary disease)   . Stroke   . Chronic back pain     Past Surgical History  Procedure Laterality Date  . Back fusion    . Cholecystectomy    . Foot surgery Bilateral     Family History  Problem Relation Age of Onset  . Cancer Father     History  Substance Use Topics  . Smoking status: Current Every Day Smoker -- 1.00 packs/day for 40 years    Types: Cigarettes  . Smokeless tobacco: Never Used  . Alcohol Use: No    OB History   Grav Para Term Preterm Abortions TAB SAB Ect Mult Living   3 3 3       3       Review of Systems  Constitutional:       Per HPI, otherwise negative  HENT:       Per HPI, otherwise negative  Respiratory:       Per HPI, otherwise  negative  Cardiovascular:       Per HPI, otherwise negative  Gastrointestinal: Negative for vomiting.  Endocrine:       Negative aside from HPI  Genitourinary:       Neg aside from HPI   Musculoskeletal:       Per HPI, otherwise negative  Skin: Negative.   Neurological: Negative for syncope.     Allergies  Penicillins  Home Medications   Current Outpatient Rx  Name  Route  Sig  Dispense  Refill  . citalopram (CELEXA) 20 MG tablet   Oral   Take 20 mg by mouth daily.         Marland Kitchen HYDROcodone-acetaminophen (NORCO/VICODIN) 5-325 MG per tablet   Oral   Take 1 tablet by mouth every 6 (six) hours as needed.         Marland Kitchen lisinopril (PRINIVIL,ZESTRIL) 20 MG tablet   Oral   Take 20 mg by mouth daily.         Marland Kitchen ALPRAZolam (XANAX) 0.25 MG tablet   Oral   Take 0.25 mg by mouth 4 (four) times daily as needed for anxiety.          Marland Kitchen  metFORMIN (GLUCOPHAGE) 500 MG tablet   Oral   Take 500 mg by mouth daily with breakfast.          . methocarbamol (ROBAXIN) 750 MG tablet   Oral   Take 750 mg by mouth at bedtime.         Marland Kitchen oxyCODONE-acetaminophen (PERCOCET) 5-325 MG per tablet   Oral   Take 1 tablet by mouth every 6 (six) hours as needed for pain.          BP 128/100  Pulse 106  Temp(Src) 97.9 F (36.6 C) (Oral)  Resp 16  Ht 5\' 5"  (1.651 m)  Wt 161 lb (73.029 kg)  BMI 26.79 kg/m2  SpO2 94%  Physical Exam  Nursing note and vitals reviewed. Constitutional: She is oriented to person, place, and time. She appears well-developed and well-nourished. No distress.  HENT:  Head: Normocephalic and atraumatic.  Eyes: Conjunctivae and EOM are normal.  Cardiovascular: Normal rate and regular rhythm.   Pulmonary/Chest: Effort normal and breath sounds normal. No stridor. No respiratory distress.  Abdominal: She exhibits no distension.  Musculoskeletal: She exhibits no edema.       Right hip: Normal.       Left hip: Normal. She exhibits no deformity.       Right knee:  Normal.       Left knee: She exhibits decreased range of motion. She exhibits no deformity.       Right ankle: Normal.       Left ankle: Normal. She exhibits no deformity.       Right upper leg: Normal.       Left upper leg: She exhibits no deformity.       Right lower leg: Normal.       Left lower leg: She exhibits no deformity.       Right foot: Normal.       Left foot: She exhibits no crepitus.  Decreased ROM in the left knee to 180 for extension and 150 for flexion.  Stable pelvis.    Neurological: She is alert and oriented to person, place, and time. No cranial nerve deficit.  Skin: Skin is warm and dry.  Psychiatric: She has a normal mood and affect.    ED Course  ORTHOPEDIC INJURY TREATMENT Date/Time: 08/05/2013 1:56 PM Performed by: [name] Authorized by: [name] Consent: Verbal consent obtained. The procedure was performed in an emergent situation. Risks and benefits: risks, benefits and alternatives were discussed Consent given by: patient Patient understanding: patient states understanding of the procedure being performed Patient consent: the patient's understanding of the procedure matches consent given Procedure consent: procedure consent matches procedure scheduled Relevant documents: relevant documents present and verified Test results: test results available and properly labeled Site marked: the operative site was marked Imaging studies: imaging studies available Required items: required blood products, implants, devices, and special equipment available Patient identity confirmed: verbally with patient Injury location: lower leg Location details: left lower leg Injury type: fracture Fracture type: proximal fibula Pre-procedure neurovascular assessment: neurovascularly intact Pre-procedure distal perfusion: normal Pre-procedure neurological function: normal Pre-procedure range of motion: reduced Local anesthesia used: no Patient sedated:  no Manipulation performed: no Immobilization: splint Splint type: short leg Supplies used: CAM walker. Post-procedure neurovascular assessment: post-procedure neurovascularly intact Post-procedure neurological function: normal Post-procedure range of motion: unchanged Patient tolerance: Patient tolerated the procedure well with no immediate complications.   (including critical care time)  DIAGNOSTIC STUDIES: Oxygen Saturation is 94% on room air, adequate  by my interpretation.    COORDINATION OF CARE: 11:48 AM-Discussed treatment plan which includes x-rays with pt at bedside and pt agreed to plan.    Labs Review Labs Reviewed - No data to display   Imaging Review Dg Hip Complete Left  08/05/2013   CLINICAL DATA:  Fall.  Leg pain  EXAM: LEFT HIP - COMPLETE 2+ VIEW  COMPARISON:  01/20/2005  FINDINGS: Negative for fracture. Normal hip joint without degenerative change or AVN.  Ray cage fusion L4-5 and L5-S1.  IMPRESSION: Negative for fracture.   Electronically Signed   By: Marlan Palau M.D.   On: 08/05/2013 12:50   Dg Knee Complete 4 Views Left  08/05/2013   CLINICAL DATA:  Fall  EXAM: LEFT KNEE - COMPLETE 4+ VIEW  COMPARISON:  01/20/2005  FINDINGS: Fracture of the proximal fibula. There is a joint effusion in the knee but no other fracture identified. Joint spaces are maintained.  IMPRESSION: Fracture of the proximal fibula with a knee joint effusion.   Electronically Signed   By: Marlan Palau M.D.   On: 08/05/2013 12:51    EKG Interpretation   None      1:55 PM Exam the patient appears comfortable.  I discussed all findings with her, her sister, her husband.  Discussed importance of orthopedics followup for further evaluation, management.  MDM  No diagnosis found. This patient presents one day after a fall with pain in her left lower leg.  On exam she is awake and alert, neurovascularly intact, clear, she describes pain focally about the left lateral knee, inferiorly,  posteriorly.  With the patient's pain should multiple x-rays performed.  These were largely reassuring, though there was evidence for a proximal fibula fracture.  The patient was discharged in stable condition of remobilization, provisional prescription crutches, orthopedics followup   Debra Munch, MD 08/05/13 1357

## 2013-12-26 ENCOUNTER — Inpatient Hospital Stay (HOSPITAL_COMMUNITY): Payer: Medicare Other

## 2013-12-26 ENCOUNTER — Inpatient Hospital Stay (HOSPITAL_COMMUNITY)
Admission: EM | Admit: 2013-12-26 | Discharge: 2013-12-28 | DRG: 439 | Disposition: A | Discharge status: Home / Self Care | Payer: Medicare Other | Attending: Family Medicine | Admitting: Family Medicine

## 2013-12-26 ENCOUNTER — Encounter (HOSPITAL_COMMUNITY): Payer: Self-pay | Admitting: Emergency Medicine

## 2013-12-26 DIAGNOSIS — E860 Dehydration: Secondary | ICD-10-CM | POA: Diagnosis present

## 2013-12-26 DIAGNOSIS — Z9089 Acquired absence of other organs: Secondary | ICD-10-CM

## 2013-12-26 DIAGNOSIS — J4489 Other specified chronic obstructive pulmonary disease: Secondary | ICD-10-CM | POA: Diagnosis present

## 2013-12-26 DIAGNOSIS — K859 Acute pancreatitis without necrosis or infection, unspecified: Principal | ICD-10-CM | POA: Diagnosis present

## 2013-12-26 DIAGNOSIS — R1110 Vomiting, unspecified: Secondary | ICD-10-CM | POA: Diagnosis present

## 2013-12-26 DIAGNOSIS — I10 Essential (primary) hypertension: Secondary | ICD-10-CM | POA: Diagnosis present

## 2013-12-26 DIAGNOSIS — E871 Hypo-osmolality and hyponatremia: Secondary | ICD-10-CM | POA: Diagnosis present

## 2013-12-26 DIAGNOSIS — E119 Type 2 diabetes mellitus without complications: Secondary | ICD-10-CM | POA: Diagnosis present

## 2013-12-26 DIAGNOSIS — Z794 Long term (current) use of insulin: Secondary | ICD-10-CM

## 2013-12-26 DIAGNOSIS — G8929 Other chronic pain: Secondary | ICD-10-CM | POA: Diagnosis present

## 2013-12-26 DIAGNOSIS — R000 Tachycardia, unspecified: Secondary | ICD-10-CM | POA: Diagnosis present

## 2013-12-26 DIAGNOSIS — M549 Dorsalgia, unspecified: Secondary | ICD-10-CM | POA: Diagnosis present

## 2013-12-26 DIAGNOSIS — Z8673 Personal history of transient ischemic attack (TIA), and cerebral infarction without residual deficits: Secondary | ICD-10-CM

## 2013-12-26 DIAGNOSIS — F17200 Nicotine dependence, unspecified, uncomplicated: Secondary | ICD-10-CM | POA: Diagnosis present

## 2013-12-26 DIAGNOSIS — J449 Chronic obstructive pulmonary disease, unspecified: Secondary | ICD-10-CM | POA: Diagnosis present

## 2013-12-26 LAB — CBC WITH DIFFERENTIAL/PLATELET
Basophils Absolute: 0 10*3/uL (ref 0.0–0.1)
Basophils Relative: 0 % (ref 0–1)
Eosinophils Absolute: 0.1 10*3/uL (ref 0.0–0.7)
Eosinophils Relative: 1 % (ref 0–5)
HCT: 52 % — ABNORMAL HIGH (ref 36.0–46.0)
HEMOGLOBIN: 18.2 g/dL — AB (ref 12.0–15.0)
LYMPHS ABS: 3.8 10*3/uL (ref 0.7–4.0)
LYMPHS PCT: 31 % (ref 12–46)
MCH: 31.3 pg (ref 26.0–34.0)
MCHC: 35 g/dL (ref 30.0–36.0)
MCV: 89.5 fL (ref 78.0–100.0)
MONOS PCT: 8 % (ref 3–12)
Monocytes Absolute: 1 10*3/uL (ref 0.1–1.0)
NEUTROS ABS: 7.4 10*3/uL (ref 1.7–7.7)
NEUTROS PCT: 60 % (ref 43–77)
Platelets: 218 10*3/uL (ref 150–400)
RBC: 5.81 MIL/uL — AB (ref 3.87–5.11)
RDW: 12.9 % (ref 11.5–15.5)
WBC: 12.3 10*3/uL — ABNORMAL HIGH (ref 4.0–10.5)

## 2013-12-26 LAB — GLUCOSE, CAPILLARY
Glucose-Capillary: 208 mg/dL — ABNORMAL HIGH (ref 70–99)
Glucose-Capillary: 89 mg/dL (ref 70–99)
Glucose-Capillary: 106 mg/dL — ABNORMAL HIGH (ref 70–99)

## 2013-12-26 LAB — COMPREHENSIVE METABOLIC PANEL
ALK PHOS: 96 U/L (ref 39–117)
ALT: 18 U/L (ref 0–35)
AST: 20 U/L (ref 0–37)
Albumin: 4 g/dL (ref 3.5–5.2)
BILIRUBIN TOTAL: 0.5 mg/dL (ref 0.3–1.2)
BUN: 23 mg/dL (ref 6–23)
CHLORIDE: 89 meq/L — AB (ref 96–112)
CO2: 29 mEq/L (ref 19–32)
Calcium: 10.2 mg/dL (ref 8.4–10.5)
Creatinine, Ser: 1.16 mg/dL — ABNORMAL HIGH (ref 0.50–1.10)
GFR calc non Af Amer: 49 mL/min — ABNORMAL LOW (ref >90)
GFR, EST AFRICAN AMERICAN: 57 mL/min — AB (ref >90)
GLUCOSE: 332 mg/dL — AB (ref 70–99)
POTASSIUM: 4.8 meq/L (ref 3.7–5.3)
Sodium: 131 mEq/L — ABNORMAL LOW (ref 137–147)
Total Protein: 7.8 g/dL (ref 6.0–8.3)

## 2013-12-26 LAB — LIPASE, BLOOD: LIPASE: 111 U/L — AB (ref 11–59)

## 2013-12-26 LAB — MAGNESIUM: Magnesium: 1.4 mg/dL — ABNORMAL LOW (ref 1.5–2.5)

## 2013-12-26 LAB — TROPONIN I: Troponin I: <0.3 ng/mL (ref <0.30)

## 2013-12-26 LAB — LACTIC ACID, PLASMA: Lactic Acid, Venous: 1.5 mmol/L (ref 0.5–2.2)

## 2013-12-26 LAB — TSH: TSH: 1.72 u[IU]/mL (ref 0.350–4.500)

## 2013-12-26 LAB — HEMOGLOBIN A1C
HEMOGLOBIN A1C: 9 % — AB (ref <5.7)
MEAN PLASMA GLUCOSE: 212 mg/dL — AB (ref <117)

## 2013-12-26 MED ORDER — INSULIN ASPART 100 UNIT/ML ~~LOC~~ SOLN
0.0000 [IU] | Freq: Three times a day (TID) | SUBCUTANEOUS | Status: DC
  Administered 2013-12-26: 2 [IU] via SUBCUTANEOUS
  Administered 2013-12-27: 8 [IU] via SUBCUTANEOUS
  Administered 2013-12-28 (×2): 3 [IU] via SUBCUTANEOUS

## 2013-12-26 MED ORDER — ACETAMINOPHEN 650 MG RE SUPP: 650.0000 mg | Freq: Four times a day (QID) | RECTAL | Status: DC | PRN

## 2013-12-26 MED ORDER — ONDANSETRON HCL 4 MG/2ML IJ SOLN
4.0000 mg | Freq: Once | INTRAMUSCULAR | Status: AC
  Administered 2013-12-26: 4 mg via INTRAVENOUS
  Filled 2013-12-26: qty 2

## 2013-12-26 MED ORDER — IPRATROPIUM-ALBUTEROL 0.5-2.5 (3) MG/3ML IN SOLN
3.0000 mL | Freq: Three times a day (TID) | RESPIRATORY_TRACT | Status: DC
  Administered 2013-12-26 – 2013-12-28 (×5): 3 mL via RESPIRATORY_TRACT
  Filled 2013-12-26 (×5): qty 3

## 2013-12-26 MED ORDER — ONDANSETRON HCL 4 MG/2ML IJ SOLN
4.0000 mg | Freq: Three times a day (TID) | INTRAMUSCULAR | Status: DC | PRN
  Administered 2013-12-26: 4 mg via INTRAVENOUS
  Filled 2013-12-26: qty 2

## 2013-12-26 MED ORDER — ALPRAZOLAM 0.25 MG PO TABS
0.2500 mg | ORAL_TABLET | Freq: Four times a day (QID) | ORAL | Status: DC | PRN
  Administered 2013-12-26 – 2013-12-28 (×3): 0.25 mg via ORAL
  Filled 2013-12-26 (×3): qty 1

## 2013-12-26 MED ORDER — SODIUM CHLORIDE 0.9 % IV SOLN
INTRAVENOUS | Status: AC
  Administered 2013-12-26: 08:00:00 via INTRAVENOUS

## 2013-12-26 MED ORDER — HYDROCODONE-ACETAMINOPHEN 5-325 MG PO TABS
1.0000 | ORAL_TABLET | Freq: Four times a day (QID) | ORAL | Status: DC | PRN
  Administered 2013-12-26 (×3): 1 via ORAL
  Filled 2013-12-26 (×3): qty 1

## 2013-12-26 MED ORDER — HYDROMORPHONE HCL PF 1 MG/ML IJ SOLN
1.0000 mg | INTRAMUSCULAR | Status: DC | PRN
  Administered 2013-12-27: 1 mg via INTRAVENOUS
  Filled 2013-12-26 (×2): qty 1

## 2013-12-26 MED ORDER — CITALOPRAM HYDROBROMIDE 20 MG PO TABS
20.0000 mg | ORAL_TABLET | Freq: Every day | ORAL | Status: DC
  Administered 2013-12-26 – 2013-12-28 (×3): 20 mg via ORAL
  Filled 2013-12-26 (×3): qty 1

## 2013-12-26 MED ORDER — SODIUM CHLORIDE 0.9 % IV BOLUS (SEPSIS)
1000.0000 mL | Freq: Once | INTRAVENOUS | Status: AC
  Administered 2013-12-26: 1000 mL via INTRAVENOUS

## 2013-12-26 MED ORDER — MAGNESIUM SULFATE 4000MG/100ML IJ SOLN
4.0000 g | Freq: Once | INTRAMUSCULAR | Status: AC
  Administered 2013-12-26: 4 g via INTRAVENOUS
  Filled 2013-12-26: qty 100

## 2013-12-26 MED ORDER — SODIUM CHLORIDE 0.9 % IJ SOLN
3.0000 mL | Freq: Two times a day (BID) | INTRAMUSCULAR | Status: DC
  Administered 2013-12-26 – 2013-12-28 (×4): 3 mL via INTRAVENOUS

## 2013-12-26 MED ORDER — SODIUM CHLORIDE 0.9 % IV SOLN: 1000.0000 mL | INTRAVENOUS | Status: DC

## 2013-12-26 MED ORDER — ONDANSETRON HCL 4 MG/2ML IJ SOLN
4.0000 mg | Freq: Four times a day (QID) | INTRAMUSCULAR | Status: DC | PRN
  Administered 2013-12-28: 4 mg via INTRAVENOUS
  Filled 2013-12-26: qty 2

## 2013-12-26 MED ORDER — ACETAMINOPHEN 325 MG PO TABS: 650.0000 mg | ORAL_TABLET | Freq: Four times a day (QID) | ORAL | Status: DC | PRN

## 2013-12-26 MED ORDER — METHOCARBAMOL 500 MG PO TABS
750.0000 mg | ORAL_TABLET | Freq: Every day | ORAL | Status: DC
  Administered 2013-12-26 – 2013-12-27 (×2): 750 mg via ORAL
  Filled 2013-12-26 (×3): qty 2

## 2013-12-26 MED ORDER — SODIUM CHLORIDE 0.9 % IV SOLN
INTRAVENOUS | Status: DC
  Administered 2013-12-26 – 2013-12-27 (×5): via INTRAVENOUS

## 2013-12-26 MED ORDER — ALBUTEROL SULFATE (2.5 MG/3ML) 0.083% IN NEBU: 2.5000 mg | INHALATION_SOLUTION | Freq: Four times a day (QID) | RESPIRATORY_TRACT | Status: DC

## 2013-12-26 MED ORDER — INSULIN ASPART 100 UNIT/ML ~~LOC~~ SOLN: 0.0000 [IU] | Freq: Every day | SUBCUTANEOUS | Status: DC

## 2013-12-26 MED ORDER — IPRATROPIUM-ALBUTEROL 0.5-2.5 (3) MG/3ML IN SOLN
3.0000 mL | Freq: Four times a day (QID) | RESPIRATORY_TRACT | Status: DC
  Administered 2013-12-26 (×2): 3 mL via RESPIRATORY_TRACT
  Filled 2013-12-26 (×2): qty 3

## 2013-12-26 MED ORDER — ALBUTEROL SULFATE (2.5 MG/3ML) 0.083% IN NEBU: 2.5000 mg | INHALATION_SOLUTION | RESPIRATORY_TRACT | Status: DC | PRN

## 2013-12-26 MED ORDER — SODIUM CHLORIDE 0.9 % IV SOLN
1000.0000 mL | Freq: Once | INTRAVENOUS | Status: AC
  Administered 2013-12-26: 1000 mL via INTRAVENOUS

## 2013-12-26 MED ORDER — ENOXAPARIN SODIUM 40 MG/0.4ML ~~LOC~~ SOLN
40.0000 mg | SUBCUTANEOUS | Status: DC
  Administered 2013-12-26 – 2013-12-28 (×3): 40 mg via SUBCUTANEOUS
  Filled 2013-12-26 (×3): qty 0.4

## 2013-12-26 MED ORDER — MORPHINE SULFATE 4 MG/ML IJ SOLN
4.0000 mg | Freq: Once | INTRAMUSCULAR | Status: AC
  Administered 2013-12-26: 4 mg via INTRAVENOUS
  Filled 2013-12-26: qty 1

## 2013-12-26 MED ORDER — IPRATROPIUM BROMIDE 0.02 % IN SOLN: 0.5000 mg | Freq: Four times a day (QID) | RESPIRATORY_TRACT | Status: DC

## 2013-12-26 MED ORDER — HYDROMORPHONE HCL PF 1 MG/ML IJ SOLN
1.0000 mg | INTRAMUSCULAR | Status: DC | PRN
  Administered 2013-12-26: 1 mg via INTRAVENOUS
  Filled 2013-12-26: qty 1

## 2013-12-26 MED ORDER — INSULIN GLARGINE 100 UNIT/ML ~~LOC~~ SOLN
20.0000 [IU] | Freq: Every day | SUBCUTANEOUS | Status: DC
  Filled 2013-12-26 (×2): qty 0.2

## 2013-12-26 MED ORDER — ONDANSETRON HCL 4 MG PO TABS
4.0000 mg | ORAL_TABLET | Freq: Four times a day (QID) | ORAL | Status: DC | PRN
  Administered 2013-12-28: 4 mg via ORAL
  Filled 2013-12-26: qty 1

## 2013-12-26 MED ORDER — GI COCKTAIL ~~LOC~~
30.0000 mL | Freq: Once | ORAL | Status: AC
  Administered 2013-12-26: 30 mL via ORAL
  Filled 2013-12-26: qty 30

## 2013-12-26 NOTE — H&P (Signed)
[facility] History and Physical  CARLETTE PALMATIER NWG:[id number] DOB: 1951-06-05 DOA: 12/26/2013  Referring physician: Dr. Roxanne Mins, ER physician PCP: Maricela Curet, MD   Chief Complaint: abdominal pain  HPI: [name] is a 63 y.o. female with history of COPD, cholecystectomy, HTN and DM, presents to the emergency room with complaints of abdominal pain. Patient reports onset of symptoms occurring 2 days ago. They have progressively gotten worse. She's had primarily right upper quadrant and epigastric type pain. This radiates through to her back. She has associated nausea and vomiting has not been able to keep any food down. She denies any diarrhea or fever. She is chronically short of breath from her COPD but is not on home oxygen. She's not had any change in cough. She denies any chest pain. No dysuria. She reports drinking alcohol only on occasion. She was evaluated in the emergency room where lab work indicated an elevated lipase level. She was also noted to be significantly tachycardic with a heart rate initially in the 150s. This has improved with IV fluids. Admission has been requested for further management   Review of Systems:  Pertinent positives as per HPI, otherwise negative  Past Medical History  Diagnosis Date  . Hypertension   . Diabetes mellitus without complication   . COPD (chronic obstructive pulmonary disease)   . Stroke   . Chronic back pain    Past Surgical History  Procedure Laterality Date  . Back fusion    . Cholecystectomy    . Foot surgery Bilateral    Social History:  reports that she has been smoking Cigarettes.  She has a 40 pack-year smoking history. She has never used smokeless tobacco. She reports that she does not drink alcohol or use illicit drugs.  Allergies  Allergen Reactions  . Penicillins     Family History  Problem Relation Age of Onset  . Cancer Father      Prior to Admission medications   Medication Sig Start Date End Date  Taking? Authorizing Provider  ALPRAZolam (XANAX) 0.25 MG tablet Take 0.25 mg by mouth 4 (four) times daily as needed for anxiety.    Yes Historical Provider, MD  citalopram (CELEXA) 20 MG tablet Take 20 mg by mouth daily. 07/04/13   Historical Provider, MD  HYDROcodone-acetaminophen (NORCO/VICODIN) 5-325 MG per tablet Take 1 tablet by mouth every 6 (six) hours as needed. 08/04/13   Historical Provider, MD  insulin glargine (LANTUS) 100 UNIT/ML injection Inject 34 Units into the skin at bedtime.    Historical Provider, MD  lisinopril (PRINIVIL,ZESTRIL) 20 MG tablet Take 20 mg by mouth daily.    Historical Provider, MD  metFORMIN (GLUCOPHAGE) 500 MG tablet Take 500 mg by mouth daily with breakfast.     Historical Provider, MD  methocarbamol (ROBAXIN) 750 MG tablet Take 750 mg by mouth at bedtime.    Historical Provider, MD  oxyCODONE-acetaminophen (PERCOCET) 5-325 MG per tablet Take 1 tablet by mouth every 6 (six) hours as needed for pain.    Historical Provider, MD  saxagliptin HCl (ONGLYZA) 2.5 MG TABS tablet Take 2.5 mg by mouth daily.    Historical Provider, MD   Physical Exam: Filed Vitals:   12/26/13 0811  BP: 121/99  Pulse: 113  Temp:   Resp:     BP 121/99  Pulse 113  Temp(Src) 97.9 F (36.6 C) (Oral)  Resp 18  Ht 5\' 7"  (1.702 m)  Wt 66.679 kg (147 lb)  BMI 23.02 kg/m2  SpO2 94%  General:  Appears calm and comfortable Eyes: PERRL, normal lids, irises & conjunctiva ENT: dry mucous membranes Neck: no LAD, masses or thyromegaly Cardiovascular: S1, S2  tachycardic, no m/r/g. No LE edema. Telemetry: tachycardia Respiratory: diminished breath sounds. Normal respiratory effort. Abdomen: soft, tender in RUQ, BS+ Skin: no rash or induration seen on limited exam Musculoskeletal: grossly normal tone BUE/BLE Psychiatric: grossly normal mood and affect, speech fluent and appropriate Neurologic: grossly non-focal.          Labs on Admission:  Basic Metabolic Panel:  Recent  Labs Lab 12/26/13 0625 12/26/13 0700  NA 131*  --   K 4.8  --   CL 89*  --   CO2 29  --   GLUCOSE 332*  --   BUN 23  --   CREATININE 1.16*  --   CALCIUM 10.2  --   MG  --  1.4*   Liver Function Tests:  Recent Labs Lab 12/26/13 0625  AST 20  ALT 18  ALKPHOS 96  BILITOT 0.5  PROT 7.8  ALBUMIN 4.0    Recent Labs Lab 12/26/13 0625  LIPASE 111*   No results found for this basename: AMMONIA,  in the last 168 hours CBC:  Recent Labs Lab 12/26/13 0625  WBC 12.3*  NEUTROABS 7.4  HGB 18.2*  HCT 52.0*  MCV 89.5  PLT 218   Cardiac Enzymes:  Recent Labs Lab 12/26/13 0625  TROPONINI <0.30    BNP (last 3 results) No results found for this basename: PROBNP,  in the last 8760 hours CBG: No results found for this basename: GLUCAP,  in the last 168 hours  Radiological Exams on Admission: No results found.  EKG: Independently reviewed. Tachycardia, no acute changes, reviewed with Dr. Stanford Breed, cardiology   Assessment/Plan Active Problems:   Pancreatitis   Tachycardia   Dehydration   Vomiting   COPD (chronic obstructive pulmonary disease)   Essential hypertension, benign   Diabetes   Hypomagnesemia   1. Acute pancreatitis. Etiology is not entirely clear. She is status post cholecystectomy in the past. She does not admit to any alcohol use. Will check fasting lipid panel as well as abdominal ultrasound. Treat with bowel rest, IV fluids and pain management. 2. Tachycardia. Possibly related to dehydration and volume depletion. EKG indicates prolonged QT. Potassium level is normal, magnesium level is low. This will be repleted. We'll continue with IV fluids and monitor on telemetry. Check TSH. 3. COPD. Appears to be at baseline. Continue bronchodilators. 4. Diabetes. She uses Lantus insulin at home. We will prescribe her half her dose of basal insulin while she is n.p.o. Continue on sliding scale insulin. We'll hold oral hypoglycemics 5. Hypertension. The  lisinopril for now. May need to start beta blockers if tachycardia persists. 6. Vomiting. Treat with antiemetics. Advance diet as tolerated  Code Status: full code Family Communication: discussed with patient and husband Disposition Plan: discharge home once improved  Time spent: 50mins  Jehanzeb Memon [facility] Pager [phone]

## 2013-12-26 NOTE — ED Provider Notes (Signed)
CSN: [id number]     Arrival date & time 12/26/13  0109 History   First MD Initiated Contact with Patient 12/26/13 [phone]     Chief Complaint  Patient presents with  . Abdominal Pain     (Consider location/radiation/quality/duration/timing/severity/associated sxs/prior Treatment) Patient is a 63 y.o. female presenting with abdominal pain. The history is provided by the patient.  Abdominal Pain She complains of sharp epigastric pain for the last 2 days. Pain radiates to the back. It is severe and she rates it at 10/10. There is associated nausea and vomiting but no diarrhea. Pain is not affected by vomiting or by having a bowel movement. Nothing makes pain worse. She did take some pain medicine that she had and hat did give her temporary relief but is no longer helping. She denies fever, chills, sweats.she says it feels just like a gallbladder attack except she doesn't have her gallbladder.  Past Medical History  Diagnosis Date  . Hypertension   . Diabetes mellitus without complication   . COPD (chronic obstructive pulmonary disease)   . Stroke   . Chronic back pain    Past Surgical History  Procedure Laterality Date  . Back fusion    . Cholecystectomy    . Foot surgery Bilateral    Family History  Problem Relation Age of Onset  . Cancer Father    History  Substance Use Topics  . Smoking status: Current Every Day Smoker -- 1.00 packs/day for 40 years    Types: Cigarettes  . Smokeless tobacco: Never Used  . Alcohol Use: No   OB History   Grav Para Term Preterm Abortions TAB SAB Ect Mult Living   3 3 3       3      Review of Systems  Gastrointestinal: Positive for abdominal pain.  All other systems reviewed and are negative.     Allergies  Penicillins  Home Medications   Current Outpatient Rx  Name  Route  Sig  Dispense  Refill  . ALPRAZolam (XANAX) 0.25 MG tablet   Oral   Take 0.25 mg by mouth 4 (four) times daily as needed for anxiety.          . citalopram  (CELEXA) 20 MG tablet   Oral   Take 20 mg by mouth daily.         Marland Kitchen HYDROcodone-acetaminophen (NORCO/VICODIN) 5-325 MG per tablet   Oral   Take 1 tablet by mouth every 6 (six) hours as needed.         . insulin glargine (LANTUS) 100 UNIT/ML injection   Subcutaneous   Inject 34 Units into the skin at bedtime.         Marland Kitchen lisinopril (PRINIVIL,ZESTRIL) 20 MG tablet   Oral   Take 20 mg by mouth daily.         . metFORMIN (GLUCOPHAGE) 500 MG tablet   Oral   Take 500 mg by mouth daily with breakfast.          . methocarbamol (ROBAXIN) 750 MG tablet   Oral   Take 750 mg by mouth at bedtime.         Marland Kitchen oxyCODONE-acetaminophen (PERCOCET) 5-325 MG per tablet   Oral   Take 1 tablet by mouth every 6 (six) hours as needed for pain.         . saxagliptin HCl (ONGLYZA) 2.5 MG TABS tablet   Oral   Take 2.5 mg by mouth daily.  BP 95/52  Pulse 156  Temp(Src) 97.9 F (36.6 C) (Oral)  Resp 24  Ht 5\' 7"  (1.702 m)  Wt 147 lb (66.679 kg)  BMI 23.02 kg/m2  SpO2 98% Physical Exam  Nursing note and vitals reviewed.  63 year old female, resting comfortably and in no acute distress. Vital signs are significant for tachycardia with heart rate 156, and tachypnea with respiratory rate of 24. Oxygen saturation is 98%, which is normal. Head is normocephalic and atraumatic. PERRLA, EOMI. Oropharynx is clear. Neck is nontender and supple without adenopathy or JVD. Back is nontender and there is no CVA tenderness. Lungs are clear without rales, wheezes, or rhonchi. Chest is nontender. Heart has regular rate and rhythm without murmur. Abdomen is soft, flat, with moderate epigastric tenderness. There is no rebound or guarding. There are no masses or hepatosplenomegaly and peristalsis is hypoactive. Extremities have no cyanosis or edema, full range of motion is present. Skin is warm and dry without rash. Neurologic: Mental status is normal, cranial nerves are intact, there are no  motor or sensory deficits.  ED Course  Procedures (including critical care time) Labs Review Results for orders placed during the hospital encounter of 12/26/13  CBC WITH DIFFERENTIAL      Result Value Ref Range   WBC 12.3 (*) 4.0 - 10.5 K/uL   RBC 5.81 (*) 3.87 - 5.11 MIL/uL   Hemoglobin 18.2 (*) 12.0 - 15.0 g/dL   HCT 52.0 (*) 36.0 - 46.0 %   MCV 89.5  78.0 - 100.0 fL   MCH 31.3  26.0 - 34.0 pg   MCHC 35.0  30.0 - 36.0 g/dL   RDW 12.9  11.5 - 15.5 %   Platelets 218  150 - 400 K/uL   Neutrophils Relative % 60  43 - 77 %   Neutro Abs 7.4  1.7 - 7.7 K/uL   Lymphocytes Relative 31  12 - 46 %   Lymphs Abs 3.8  0.7 - 4.0 K/uL   Monocytes Relative 8  3 - 12 %   Monocytes Absolute 1.0  0.1 - 1.0 K/uL   Eosinophils Relative 1  0 - 5 %   Eosinophils Absolute 0.1  0.0 - 0.7 K/uL   Basophils Relative 0  0 - 1 %   Basophils Absolute 0.0  0.0 - 0.1 K/uL  COMPREHENSIVE METABOLIC PANEL      Result Value Ref Range   Sodium 131 (*) 137 - 147 mEq/L   Potassium 4.8  3.7 - 5.3 mEq/L   Chloride 89 (*) 96 - 112 mEq/L   CO2 29  19 - 32 mEq/L   Glucose, Bld 332 (*) 70 - 99 mg/dL   BUN 23  6 - 23 mg/dL   Creatinine, Ser 1.16 (*) 0.50 - 1.10 mg/dL   Calcium 10.2  8.4 - 10.5 mg/dL   Total Protein 7.8  6.0 - 8.3 g/dL   Albumin 4.0  3.5 - 5.2 g/dL   AST 20  0 - 37 U/L   ALT 18  0 - 35 U/L   Alkaline Phosphatase 96  39 - 117 U/L   Total Bilirubin 0.5  0.3 - 1.2 mg/dL   GFR calc non Af Amer 49 (*) >90 mL/min   GFR calc Af Amer 57 (*) >90 mL/min  LIPASE, BLOOD      Result Value Ref Range   Lipase 111 (*) 11 - 59 U/L  LACTIC ACID, PLASMA      Result Value Ref Range  Lactic Acid, Venous 1.5  0.5 - 2.2 mmol/L  TROPONIN I      Result Value Ref Range   Troponin I <0.30  <0.30 ng/mL   MDM   Final diagnoses:  Pancreatitis  Tachycardia  Hyponatremia    Epigastric pain of uncertain cause. Her vital signs show very rapid heartbeat which was not noted on my physical exam. Old records are  reviewed and she has been seen several times for abdominal pain and had unremarkable CT scans. Screening labs will be obtained and she will be given a therapeutic trial of GI cocktail.  There was mild relief with GI cocktail but still considerable pain. Tachycardia improved somewhat with IV fluids but heart rate continues to be over 120. She's given a second liter of fluid with little change in heart rate. She was given a dose of morphine with moderate improvement in pain. It was elected to admit the patient based on elevated lipase and persistent tachycardia. Lactic acid has come back normal. She is mildly hyponatremic which is not clinically significant. Anion gap is normal. Case is discussed with the Dr. Roderic Palau [facility] who agrees to admit the patient.  Delora Fuel, MD 90/24/09 [id number]

## 2013-12-26 NOTE — ED Notes (Signed)
Pt reporting pain in upper abdomen since Friday.  Reporting nausea and decreased appetite.

## 2013-12-26 NOTE — ED Notes (Signed)
Dr. Roderic Palau in to evaluate

## 2013-12-27 LAB — COMPREHENSIVE METABOLIC PANEL
ALT: 18 U/L (ref 0–35)
AST: 22 U/L (ref 0–37)
Albumin: 3.4 g/dL — ABNORMAL LOW (ref 3.5–5.2)
Alkaline Phosphatase: 72 U/L (ref 39–117)
BUN: 16 mg/dL (ref 6–23)
CALCIUM: 8.8 mg/dL (ref 8.4–10.5)
CO2: 30 meq/L (ref 19–32)
CREATININE: 0.82 mg/dL (ref 0.50–1.10)
Chloride: 101 mEq/L (ref 96–112)
GFR calc non Af Amer: 75 mL/min — ABNORMAL LOW (ref >90)
GFR, EST AFRICAN AMERICAN: 86 mL/min — AB (ref >90)
Glucose, Bld: 129 mg/dL — ABNORMAL HIGH (ref 70–99)
Potassium: 4.9 mEq/L (ref 3.7–5.3)
Sodium: 139 mEq/L (ref 137–147)
Total Bilirubin: 0.3 mg/dL (ref 0.3–1.2)
Total Protein: 6.3 g/dL (ref 6.0–8.3)

## 2013-12-27 LAB — LIPID PANEL
Cholesterol: 111 mg/dL (ref 0–200)
HDL: 41 mg/dL (ref >39)
LDL Cholesterol: 45 mg/dL (ref 0–99)
Total CHOL/HDL Ratio: 2.7 RATIO
Triglycerides: 123 mg/dL (ref <150)
VLDL: 25 mg/dL (ref 0–40)

## 2013-12-27 LAB — GLUCOSE, CAPILLARY
GLUCOSE-CAPILLARY: 110 mg/dL — AB (ref 70–99)
GLUCOSE-CAPILLARY: 119 mg/dL — AB (ref 70–99)
Glucose-Capillary: 265 mg/dL — ABNORMAL HIGH (ref 70–99)
Glucose-Capillary: 96 mg/dL (ref 70–99)

## 2013-12-27 LAB — CBC
HCT: 47.2 % — ABNORMAL HIGH (ref 36.0–46.0)
HEMOGLOBIN: 15.3 g/dL — AB (ref 12.0–15.0)
MCH: 30.3 pg (ref 26.0–34.0)
MCHC: 32.4 g/dL (ref 30.0–36.0)
MCV: 93.5 fL (ref 78.0–100.0)
Platelets: 170 10*3/uL (ref 150–400)
RBC: 5.05 MIL/uL (ref 3.87–5.11)
RDW: 13.2 % (ref 11.5–15.5)
WBC: 7.8 10*3/uL (ref 4.0–10.5)

## 2013-12-27 MED ORDER — INSULIN GLARGINE 100 UNIT/ML ~~LOC~~ SOLN
30.0000 [IU] | Freq: Every day | SUBCUTANEOUS | Status: DC
  Filled 2013-12-27 (×2): qty 0.3

## 2013-12-27 MED ORDER — GLUCERNA SHAKE PO LIQD
237.0000 mL | Freq: Two times a day (BID) | ORAL | Status: DC
  Administered 2013-12-27 – 2013-12-28 (×2): 237 mL via ORAL

## 2013-12-27 MED ORDER — PANTOPRAZOLE SODIUM 40 MG PO TBEC
40.0000 mg | DELAYED_RELEASE_TABLET | Freq: Every day | ORAL | Status: DC
  Administered 2013-12-27 – 2013-12-28 (×2): 40 mg via ORAL
  Filled 2013-12-27 (×2): qty 1

## 2013-12-27 NOTE — Care Management Note (Signed)
    Page 1 of 1   12/27/2013     3:08:42 PM   CARE MANAGEMENT NOTE 12/27/2013  Patient:  [name], [name]   Account Number:  [id number]  Date Initiated:  12/27/2013  Documentation initiated by:  Claretha Cooper  Subjective/Objective Assessment:   Pt admitted from home where she is independent and lives with her spouse. DC plans are to return home     Action/Plan:   Anticipated DC Date:  12/28/2013   Anticipated DC Plan:  [place]  CM consult      Choice offered to / List presented to:             Status of service:  Completed, signed off Medicare Important Message given?   (If response is "NO", the following Medicare IM given date fields will be blank) Date Medicare IM given:   Date Additional Medicare IM given:    Discharge Disposition:    Per UR Regulation:    If discussed at Long Length of Stay Meetings, dates discussed:    Comments:  12/27/13 Claretha Cooper RN BSN CM

## 2013-12-27 NOTE — Progress Notes (Signed)
Patient sitting up in bed with no complaints voiced at this time Information on pancreatitis given to patient. No questions at this time.

## 2013-12-27 NOTE — Progress Notes (Signed)
[id number] 

## 2013-12-27 NOTE — Progress Notes (Signed)
Utilization Review Complete  

## 2013-12-27 NOTE — Progress Notes (Signed)
INITIAL NUTRITION ASSESSMENT  DOCUMENTATION CODES Per approved criteria  -Non-severe (moderate) malnutrition in the context of acute illness or injury   Pt meets criteria for moderate MALNUTRITION in the context of acute illness as evidenced by moderate muscle depletion, <75% estimated intake x 7 days.  INTERVENTION: Follow for diet advancement. Add ONS with diet advancement.   NUTRITION DIAGNOSIS: Inadequate oral intake related to altered GI function as evidenced by NPO.   Goal: Pt will meet >90% of estimated nutritional needs  Monitor:  Diet advancement, PO intake, labs, skin assessments, I/O's, weight changes  Reason for Assessment: MST=4  63 y.o. female  Admitting Dx: <principal problem not specified>  ASSESSMENT: Pt admitted for pancreatitis. She reports extremely poor appetite x 4 days, stating "I didn't even want to look at food". However, she reports that now she is "starving" and ready for diet advancement.  She reports hx of progressive wt loss x 1 year, due to multiple illnesses. She reports that she had shingles approximately one year ago and it took her approximately one year to "finally get over it". She admits to decreased oral intake over the past year due to multiple illnesses.  Wt hx reveals a 14% wt loss x 1 year and a 9.3% wt loss x approximately 5 months.  Nutrition Focused Physical Exam:  Subcutaneous Fat:  Orbital Region: WDL Upper Arm Region: moderate depletion Thoracic and Lumbar Region: WDL  Muscle:  Temple Region: WDL Clavicle Bone Region: WDL Clavicle and Acromion Bone Region: WDL Scapular Bone Region: WDL Dorsal Hand: WDL Patellar Region: moderate depletion Anterior Thigh Region: moderate depletion Posterior Calf Region: moderate depletion  Edema: none present  Height: Ht Readings from Last 1 Encounters:  12/26/13 5\' 7"  (1.702 m)    Weight: Wt Readings from Last 1 Encounters:  12/26/13 146 lb 15.7 oz (66.67 kg)    Ideal Body  Weight: 135#  % Ideal Body Weight: 108%  Wt Readings from Last 10 Encounters:  12/26/13 146 lb 15.7 oz (66.67 kg)  08/05/13 161 lb (73.029 kg)  12/20/12 170 lb (77.111 kg)  07/17/12 180 lb (81.647 kg)    Usual Body Weight: 180#  % Usual Body Weight: 81%  BMI:  Body mass index is 23.02 kg/(m^2). Meets criteria for normal weight.   Estimated Nutritional Needs: Kcal: 1300-1500 daily Protein: 54-67 grams daily Fluid: 1.3-1.5 L daily  Skin: WDL  Diet Order: NPO  EDUCATION NEEDS: -Education needs addressed   Intake/Output Summary (Last 24 hours) at 12/27/13 1221 Last data filed at 12/26/13 1637  Gross per 24 hour  Intake 722.92 ml  Output      0 ml  Net 722.92 ml    Last BM: 12/26/13  Labs:   Recent Labs Lab 12/26/13 0625 12/26/13 0700 12/27/13 0500  NA 131*  --  139  K 4.8  --  4.9  CL 89*  --  101  CO2 29  --  30  BUN 23  --  16  CREATININE 1.16*  --  0.82  CALCIUM 10.2  --  8.8  MG  --  1.4*  --   GLUCOSE 332*  --  129*    CBG (last 3)   Recent Labs  12/26/13 1636 12/26/13 2135 12/27/13 0746  GLUCAP 89 106* 110*    Scheduled Meds: . citalopram  20 mg Oral Daily  . enoxaparin (LOVENOX) injection  40 mg Subcutaneous Q24H  . insulin aspart  0-15 Units Subcutaneous TID WC  . insulin aspart  0-5 Units Subcutaneous QHS  . insulin glargine  20 Units Subcutaneous QHS  . ipratropium-albuterol  3 mL Nebulization TID  . methocarbamol  750 mg Oral QHS  . sodium chloride  3 mL Intravenous Q12H    Continuous Infusions: . sodium chloride 125 mL/hr at 12/27/13 7681    Past Medical History  Diagnosis Date  . Hypertension   . Diabetes mellitus without complication   . COPD (chronic obstructive pulmonary disease)   . Stroke   . Chronic back pain     Past Surgical History  Procedure Laterality Date  . Back fusion    . Cholecystectomy    . Foot surgery Bilateral     Max Romano A. Jimmye Norman, RD, LDN Pager: [phone]

## 2013-12-28 LAB — GLUCOSE, CAPILLARY
GLUCOSE-CAPILLARY: 172 mg/dL — AB (ref 70–99)
Glucose-Capillary: 181 mg/dL — ABNORMAL HIGH (ref 70–99)

## 2013-12-28 LAB — LIPASE, BLOOD: Lipase: 43 U/L (ref 11–59)

## 2013-12-28 MED ORDER — OXYCODONE-ACETAMINOPHEN 5-325 MG PO TABS: 1.0000 | ORAL_TABLET | Freq: Three times a day (TID) | ORAL | Status: DC | PRN

## 2013-12-28 MED ORDER — PANTOPRAZOLE SODIUM 40 MG PO TBEC: 40.0000 mg | DELAYED_RELEASE_TABLET | Freq: Every day | ORAL | Status: DC

## 2013-12-28 NOTE — Progress Notes (Signed)
NAME:  [name], FETTES NO.:  [id number]  MEDICAL RECORD NO.:  [id number]  LOCATION:  A340                          FACILITY:  [facility]  PHYSICIAN:  Unk Lightning, MDDATE OF BIRTH:  Oct 05, 1950  DATE OF PROCEDURE: DATE OF DISCHARGE:                                PROGRESS NOTE   SUBJECTIVE:  This is a 63 year old female in a great amount of stress with chronic anxiety and family discord and had some epigastric pain radiating to her back last night, who presents to the ER and had a minimally elevated lipase of 111.  No nausea, no vomiting.  She has been n.p.o. since admission.  Now has no epigastric pain.  No pain radiating to her back.  No dyspnea, orthopnea, or PND.  CT scan of the abdomen and pelvis were essentially normal.  Question of some interstitial edema. She has some tachycardia and also likewise has insulin-dependent diabetes, hypertension, and hyperlipidemia all under good control.  Good glycemic control, on Lantus 20 units at bedtime and n.p.o.  We will increase this as we increase diet.  OBJECTIVE:  LUNGS:  Show prolonged expiratory phase.  No rales, rhonchi. HEART:  Regular rhythm.  No murmurs, gallops, heaves, thrills, or rubs. ABDOMEN:  Soft, nontender.  Bowel sounds normoactive.  No guarding, rebound, mass, or megaly.  PLAN:  Right now is to get thyroid function tests to see if there is any etiology of the tachycardia upon admission other than anxiety.  I believe she has normal ventricular function from an echo done in office to check results, if not we will order one and I increased Lantus as we began to feed her, and monitor lipase daily.     Unk Lightning, MD     RMD/MEDQ  D:  12/27/2013  T:  12/28/2013  Job:  [id number]

## 2013-12-28 NOTE — Discharge Summary (Signed)
[id number] 

## 2013-12-29 NOTE — Discharge Summary (Signed)
NAME:  [name], [name] NO.:  [id number]  MEDICAL RECORD NO.:  [id number]  LOCATION:                                 FACILITY:  PHYSICIAN:  Unk Lightning, MDDATE OF BIRTH:  06/11/1951  DATE OF ADMISSION:  12/26/2013 DATE OF DISCHARGE:  04/14/2015LH                              DISCHARGE SUMMARY   The patient is a 63 year old, white female, undergoing significant family stress with a baseline anxiety disorder, insulin-dependent diabetes, hypertension, history of GERD, history of chronic low back pain due to herniated nucleus pulposus, and degenerative joint disease of left hip.  The patient was in her usual state of health, came to the hospital due to epigastric pain radiating through her back.  She was found to have an elevated lipase of about 120 something and given bowel rest, antacids.  CT scan revealed no architectural abnormalities or pseudocyst formation noted.  There is a mention of some interstitial edema per chest x-ray, but no significant abnormalities of the abdomen or pelvis.  After bowel rest, she had minimal discomfort and was progressed her diet to dysphagia 3 which she seemed to tolerate without any postprandial pain symptoms.  She had her insulin reduced first day in hospital due to being n.p.o. and then subsequently increased to 35 units of Lantus h.s.  She had good glycemic control.  She was hemodynamically stable.  Her lipase normalized on the third hospital day with no symptomatology.  Subsequently discharged on the new medicine of Protonix 40 p.o. daily and advised not to have alcohol which is not an issue; not to smoke, which is not an issue; not to have high calorie, high fat, or greasy foods.  She understands this well.  She will be followed up in the office within 7-10 day period which is her regular visit.  She did have some tachycardia while in hospital, however, with the degree of anxiety, there is no evidence of  ventricular dysfunction by echo done I believe 2 years ago in the office.  DISCHARGE MEDICINES: 1. Protonix 40 p.o. daily. 2. Oxycodone 5/325 p.o. t.i.d. p.r.n. 3. Xanax 0.25 p.o. q.i.d. p.r.n. 4. Celexa 20 p.o. daily. 5. Lantus 35 units subcutaneous each bedtime. 6. Zestril 20 mg p.o. daily. 7. Kombiglyze XR 5/500 one tablet p.o. daily.  She was told to stop taking Bactrim which was given for some MRSA like cellulitis.    Unk Lightning, MD    RMD/MEDQ  D:  12/28/2013  T:  12/29/2013  Job:  [id number]

## 2014-07-18 ENCOUNTER — Encounter (HOSPITAL_COMMUNITY): Payer: Self-pay | Admitting: Emergency Medicine

## 2015-04-09 ENCOUNTER — Encounter (HOSPITAL_COMMUNITY): Payer: Self-pay | Admitting: *Deleted

## 2015-04-09 ENCOUNTER — Emergency Department (HOSPITAL_COMMUNITY): Payer: Medicare Other

## 2015-04-09 ENCOUNTER — Emergency Department (HOSPITAL_BASED_OUTPATIENT_CLINIC_OR_DEPARTMENT_OTHER): Admit: 2015-04-09 | Discharge: 2015-04-09 | Disposition: A | Discharge status: Home / Self Care | Payer: Medicare Other

## 2015-04-09 ENCOUNTER — Inpatient Hospital Stay (HOSPITAL_COMMUNITY)
Admission: EM | Admit: 2015-04-09 | Discharge: 2015-04-15 | DRG: 253 | Disposition: A | Discharge status: Home Health | Payer: Medicare Other | Attending: Internal Medicine | Admitting: Internal Medicine

## 2015-04-09 DIAGNOSIS — I471 Supraventricular tachycardia: Secondary | ICD-10-CM | POA: Diagnosis not present

## 2015-04-09 DIAGNOSIS — M79605 Pain in left leg: Secondary | ICD-10-CM

## 2015-04-09 DIAGNOSIS — I70201 Unspecified atherosclerosis of native arteries of extremities, right leg: Secondary | ICD-10-CM | POA: Diagnosis present

## 2015-04-09 DIAGNOSIS — M6282 Rhabdomyolysis: Secondary | ICD-10-CM | POA: Diagnosis present

## 2015-04-09 DIAGNOSIS — I998 Other disorder of circulatory system: Secondary | ICD-10-CM | POA: Diagnosis not present

## 2015-04-09 DIAGNOSIS — R000 Tachycardia, unspecified: Secondary | ICD-10-CM | POA: Diagnosis not present

## 2015-04-09 DIAGNOSIS — I10 Essential (primary) hypertension: Secondary | ICD-10-CM | POA: Diagnosis not present

## 2015-04-09 DIAGNOSIS — Z720 Tobacco use: Secondary | ICD-10-CM | POA: Diagnosis not present

## 2015-04-09 DIAGNOSIS — J449 Chronic obstructive pulmonary disease, unspecified: Secondary | ICD-10-CM | POA: Diagnosis present

## 2015-04-09 DIAGNOSIS — E871 Hypo-osmolality and hyponatremia: Secondary | ICD-10-CM | POA: Diagnosis present

## 2015-04-09 DIAGNOSIS — I743 Embolism and thrombosis of arteries of the lower extremities: Secondary | ICD-10-CM | POA: Diagnosis present

## 2015-04-09 DIAGNOSIS — E860 Dehydration: Secondary | ICD-10-CM | POA: Diagnosis present

## 2015-04-09 DIAGNOSIS — M79671 Pain in right foot: Secondary | ICD-10-CM | POA: Diagnosis not present

## 2015-04-09 DIAGNOSIS — R0602 Shortness of breath: Secondary | ICD-10-CM

## 2015-04-09 DIAGNOSIS — F17210 Nicotine dependence, cigarettes, uncomplicated: Secondary | ICD-10-CM | POA: Diagnosis present

## 2015-04-09 DIAGNOSIS — E08311 Diabetes mellitus due to underlying condition with unspecified diabetic retinopathy with macular edema: Secondary | ICD-10-CM | POA: Diagnosis not present

## 2015-04-09 DIAGNOSIS — Z8673 Personal history of transient ischemic attack (TIA), and cerebral infarction without residual deficits: Secondary | ICD-10-CM | POA: Diagnosis not present

## 2015-04-09 DIAGNOSIS — M79609 Pain in unspecified limb: Secondary | ICD-10-CM

## 2015-04-09 DIAGNOSIS — E785 Hyperlipidemia, unspecified: Secondary | ICD-10-CM | POA: Diagnosis present

## 2015-04-09 DIAGNOSIS — K219 Gastro-esophageal reflux disease without esophagitis: Secondary | ICD-10-CM | POA: Diagnosis present

## 2015-04-09 DIAGNOSIS — K047 Periapical abscess without sinus: Secondary | ICD-10-CM | POA: Diagnosis present

## 2015-04-09 DIAGNOSIS — R931 Abnormal findings on diagnostic imaging of heart and coronary circulation: Secondary | ICD-10-CM | POA: Diagnosis not present

## 2015-04-09 DIAGNOSIS — M79604 Pain in right leg: Secondary | ICD-10-CM | POA: Diagnosis present

## 2015-04-09 DIAGNOSIS — N179 Acute kidney failure, unspecified: Secondary | ICD-10-CM | POA: Diagnosis present

## 2015-04-09 DIAGNOSIS — E875 Hyperkalemia: Secondary | ICD-10-CM | POA: Diagnosis present

## 2015-04-09 DIAGNOSIS — Z79899 Other long term (current) drug therapy: Secondary | ICD-10-CM | POA: Diagnosis not present

## 2015-04-09 DIAGNOSIS — J410 Simple chronic bronchitis: Secondary | ICD-10-CM | POA: Diagnosis not present

## 2015-04-09 DIAGNOSIS — F329 Major depressive disorder, single episode, unspecified: Secondary | ICD-10-CM | POA: Diagnosis present

## 2015-04-09 DIAGNOSIS — Z794 Long term (current) use of insulin: Secondary | ICD-10-CM

## 2015-04-09 DIAGNOSIS — R05 Cough: Secondary | ICD-10-CM

## 2015-04-09 DIAGNOSIS — Z981 Arthrodesis status: Secondary | ICD-10-CM | POA: Diagnosis not present

## 2015-04-09 DIAGNOSIS — I509 Heart failure, unspecified: Secondary | ICD-10-CM | POA: Diagnosis not present

## 2015-04-09 DIAGNOSIS — I771 Stricture of artery: Secondary | ICD-10-CM | POA: Diagnosis not present

## 2015-04-09 DIAGNOSIS — I4891 Unspecified atrial fibrillation: Secondary | ICD-10-CM | POA: Insufficient documentation

## 2015-04-09 DIAGNOSIS — R059 Cough, unspecified: Secondary | ICD-10-CM

## 2015-04-09 DIAGNOSIS — I9581 Postprocedural hypotension: Secondary | ICD-10-CM | POA: Diagnosis not present

## 2015-04-09 DIAGNOSIS — E1159 Type 2 diabetes mellitus with other circulatory complications: Secondary | ICD-10-CM

## 2015-04-09 DIAGNOSIS — E1165 Type 2 diabetes mellitus with hyperglycemia: Secondary | ICD-10-CM | POA: Diagnosis present

## 2015-04-09 DIAGNOSIS — I70229 Atherosclerosis of native arteries of extremities with rest pain, unspecified extremity: Secondary | ICD-10-CM | POA: Diagnosis present

## 2015-04-09 DIAGNOSIS — J418 Mixed simple and mucopurulent chronic bronchitis: Secondary | ICD-10-CM | POA: Diagnosis not present

## 2015-04-09 DIAGNOSIS — Z9889 Other specified postprocedural states: Secondary | ICD-10-CM | POA: Diagnosis not present

## 2015-04-09 DIAGNOSIS — E119 Type 2 diabetes mellitus without complications: Secondary | ICD-10-CM

## 2015-04-09 LAB — BASIC METABOLIC PANEL
ANION GAP: 13 (ref 5–15)
BUN: 25 mg/dL — ABNORMAL HIGH (ref 6–20)
CO2: 25 mmol/L (ref 22–32)
Calcium: 10 mg/dL (ref 8.9–10.3)
Chloride: 93 mmol/L — ABNORMAL LOW (ref 101–111)
Creatinine, Ser: 1.26 mg/dL — ABNORMAL HIGH (ref 0.44–1.00)
GFR calc Af Amer: 51 mL/min — ABNORMAL LOW (ref >60)
GFR calc non Af Amer: 44 mL/min — ABNORMAL LOW (ref >60)
GLUCOSE: 132 mg/dL — AB (ref 65–99)
Potassium: 5.5 mmol/L — ABNORMAL HIGH (ref 3.5–5.1)
SODIUM: 131 mmol/L — AB (ref 135–145)

## 2015-04-09 LAB — CBC WITH DIFFERENTIAL/PLATELET
Basophils Absolute: 0 10*3/uL (ref 0.0–0.1)
Basophils Relative: 0 % (ref 0–1)
Eosinophils Absolute: 0 10*3/uL (ref 0.0–0.7)
Eosinophils Relative: 0 % (ref 0–5)
HCT: 54.6 % — ABNORMAL HIGH (ref 36.0–46.0)
Hemoglobin: 19.1 g/dL — ABNORMAL HIGH (ref 12.0–15.0)
LYMPHS PCT: 21 % (ref 12–46)
Lymphs Abs: 3 10*3/uL (ref 0.7–4.0)
MCH: 32.6 pg (ref 26.0–34.0)
MCHC: 35 g/dL (ref 30.0–36.0)
MCV: 93.3 fL (ref 78.0–100.0)
Monocytes Absolute: 1.4 10*3/uL — ABNORMAL HIGH (ref 0.1–1.0)
Monocytes Relative: 10 % (ref 3–12)
NEUTROS PCT: 69 % (ref 43–77)
Neutro Abs: 10 10*3/uL — ABNORMAL HIGH (ref 1.7–7.7)
PLATELETS: 249 10*3/uL (ref 150–400)
RBC: 5.85 MIL/uL — AB (ref 3.87–5.11)
RDW: 13.5 % (ref 11.5–15.5)
WBC: 14.4 10*3/uL — AB (ref 4.0–10.5)

## 2015-04-09 LAB — MRSA PCR SCREENING: MRSA by PCR: NEGATIVE

## 2015-04-09 LAB — APTT: aPTT: 27 seconds (ref 24–37)

## 2015-04-09 LAB — GLUCOSE, CAPILLARY: GLUCOSE-CAPILLARY: 108 mg/dL — AB (ref 65–99)

## 2015-04-09 LAB — CK: Total CK: 10787 U/L — ABNORMAL HIGH (ref 38–234)

## 2015-04-09 MED ORDER — PANTOPRAZOLE SODIUM 40 MG PO TBEC
40.0000 mg | DELAYED_RELEASE_TABLET | Freq: Every day | ORAL | Status: DC
  Administered 2015-04-11 – 2015-04-15 (×5): 40 mg via ORAL
  Filled 2015-04-09 (×4): qty 1

## 2015-04-09 MED ORDER — HEPARIN (PORCINE) IN NACL 100-0.45 UNIT/ML-% IJ SOLN
1100.0000 [IU]/h | INTRAMUSCULAR | Status: DC
  Administered 2015-04-09: 1200 [IU]/h via INTRAVENOUS
  Filled 2015-04-09 (×2): qty 250

## 2015-04-09 MED ORDER — HEPARIN BOLUS VIA INFUSION
4000.0000 [IU] | Freq: Once | INTRAVENOUS | Status: AC
  Administered 2015-04-09: 4000 [IU] via INTRAVENOUS
  Filled 2015-04-09: qty 4000

## 2015-04-09 MED ORDER — CEPHALEXIN 500 MG PO CAPS
500.0000 mg | ORAL_CAPSULE | Freq: Three times a day (TID) | ORAL | Status: AC
  Administered 2015-04-10 – 2015-04-13 (×15): 500 mg via ORAL
  Filled 2015-04-09 (×17): qty 1

## 2015-04-09 MED ORDER — SODIUM CHLORIDE 0.9 % IV BOLUS (SEPSIS)
1000.0000 mL | Freq: Once | INTRAVENOUS | Status: AC
  Administered 2015-04-09: 1000 mL via INTRAVENOUS

## 2015-04-09 MED ORDER — OXYCODONE-ACETAMINOPHEN 5-325 MG PO TABS
1.0000 | ORAL_TABLET | Freq: Four times a day (QID) | ORAL | Status: DC | PRN
  Administered 2015-04-12 – 2015-04-13 (×3): 1 via ORAL
  Administered 2015-04-13 – 2015-04-15 (×7): 2 via ORAL
  Filled 2015-04-09: qty 2
  Filled 2015-04-09: qty 1
  Filled 2015-04-09 (×3): qty 2
  Filled 2015-04-09: qty 1
  Filled 2015-04-09 (×3): qty 2
  Filled 2015-04-09: qty 1
  Filled 2015-04-09: qty 2

## 2015-04-09 MED ORDER — SODIUM CHLORIDE 0.9 % IV SOLN: INTRAVENOUS | Status: DC

## 2015-04-09 MED ORDER — LEVALBUTEROL HCL 0.63 MG/3ML IN NEBU
0.6300 mg | INHALATION_SOLUTION | Freq: Four times a day (QID) | RESPIRATORY_TRACT | Status: DC | PRN
  Filled 2015-04-09: qty 3

## 2015-04-09 MED ORDER — HYDROMORPHONE HCL 1 MG/ML IJ SOLN
0.5000 mg | INTRAMUSCULAR | Status: DC | PRN
  Administered 2015-04-09 – 2015-04-11 (×4): 1 mg via INTRAVENOUS
  Filled 2015-04-09 (×5): qty 1

## 2015-04-09 MED ORDER — INSULIN ASPART 100 UNIT/ML ~~LOC~~ SOLN: 0.0000 [IU] | Freq: Three times a day (TID) | SUBCUTANEOUS | Status: DC

## 2015-04-09 MED ORDER — CEPHALEXIN 500 MG PO CAPS
500.0000 mg | ORAL_CAPSULE | Freq: Three times a day (TID) | ORAL | Status: DC
  Filled 2015-04-09 (×2): qty 1

## 2015-04-09 MED ORDER — MORPHINE SULFATE 4 MG/ML IJ SOLN
4.0000 mg | Freq: Once | INTRAMUSCULAR | Status: AC
  Administered 2015-04-09: 4 mg via INTRAVENOUS
  Filled 2015-04-09: qty 1

## 2015-04-09 MED ORDER — ALPRAZOLAM 0.25 MG PO TABS: 0.2500 mg | ORAL_TABLET | Freq: Four times a day (QID) | ORAL | Status: DC | PRN

## 2015-04-09 MED ORDER — CITALOPRAM HYDROBROMIDE 20 MG PO TABS
20.0000 mg | ORAL_TABLET | Freq: Every day | ORAL | Status: DC
  Administered 2015-04-11 – 2015-04-15 (×5): 20 mg via ORAL
  Filled 2015-04-09 (×6): qty 1

## 2015-04-09 MED ORDER — ONDANSETRON HCL 4 MG/2ML IJ SOLN
4.0000 mg | Freq: Once | INTRAMUSCULAR | Status: AC
  Administered 2015-04-09: 4 mg via INTRAVENOUS
  Filled 2015-04-09: qty 2

## 2015-04-09 MED ORDER — SODIUM POLYSTYRENE SULFONATE 15 GM/60ML PO SUSP
15.0000 g | Freq: Once | ORAL | Status: AC
  Administered 2015-04-09: 15 g via ORAL

## 2015-04-09 MED ORDER — INSULIN ASPART 100 UNIT/ML ~~LOC~~ SOLN
0.0000 [IU] | Freq: Every day | SUBCUTANEOUS | Status: DC
Start: 2015-04-09 — End: 2015-04-10

## 2015-04-09 MED ORDER — SODIUM CHLORIDE 0.9 % IV SOLN
INTRAVENOUS | Status: DC
  Administered 2015-04-09: 16:00:00 via INTRAVENOUS

## 2015-04-09 MED ORDER — INSULIN GLARGINE 100 UNIT/ML ~~LOC~~ SOLN
25.0000 [IU] | Freq: Every day | SUBCUTANEOUS | Status: DC
  Administered 2015-04-10 – 2015-04-12 (×3): 25 [IU] via SUBCUTANEOUS
  Filled 2015-04-09 (×5): qty 0.25

## 2015-04-09 NOTE — ED Provider Notes (Signed)
CSN: [id number]     Arrival date & time 04/09/15  1437 History   First MD Initiated Contact with Patient 04/09/15 1500     Chief Complaint  Patient presents with  . Foot Pain     (Consider location/radiation/quality/duration/timing/severity/associated sxs/prior Treatment) Patient is a 64 y.o. female presenting with lower extremity pain. The history is provided by the patient and a relative.  Foot Pain   [name] is a 64 y.o. female who presents for evaluation of gradually worsening pain and swelling in the right leg for 5 days. Today the pain was severe, so she came here for evaluation. The pain is worse with standing. No known trauma to the leg. She is taking her usual medications, without relief. She has never had a problem like this previously. She continues to smoke. There are no other known modifying factors.   Past Medical History  Diagnosis Date  . Hypertension   . Diabetes mellitus without complication   . COPD (chronic obstructive pulmonary disease)   . Stroke   . Chronic back pain    Past Surgical History  Procedure Laterality Date  . Back fusion    . Cholecystectomy    . Foot surgery Bilateral   . Cardiac catheterization     Family History  Problem Relation Age of Onset  . Cancer Father    History  Substance Use Topics  . Smoking status: Current Every Day Smoker -- 1.50 packs/day for 40 years    Types: Cigarettes  . Smokeless tobacco: Never Used  . Alcohol Use: No   OB History    Gravida Para Term Preterm AB TAB SAB Ectopic Multiple Living   3 3 3       3      Review of Systems  All other systems reviewed and are negative.     Allergies  Penicillins and Ciprofloxacin  Home Medications   Prior to Admission medications   Medication Sig Start Date End Date Taking? Authorizing Provider  ALPRAZolam (XANAX) 0.25 MG tablet Take 0.25 mg by mouth 4 (four) times daily as needed for anxiety.    Yes Historical Provider, MD  cephALEXin (KEFLEX) 500 MG  capsule Take 500 mg by mouth 4 (four) times daily. For 7 days(started 04/07/15)   Yes Historical Provider, MD  citalopram (CELEXA) 20 MG tablet Take 20 mg by mouth daily. 07/04/13  Yes Historical Provider, MD  Insulin Glargine (LANTUS SOLOSTAR) 100 UNIT/ML Solostar Pen Inject 34 Units into the skin at bedtime.   Yes Historical Provider, MD  lisinopril (PRINIVIL,ZESTRIL) 20 MG tablet Take 20 mg by mouth daily.   Yes Historical Provider, MD  metFORMIN (GLUCOPHAGE) 500 MG tablet Take 500 mg by mouth 2 (two) times daily with a meal.   Yes Historical Provider, MD  naproxen (NAPROSYN) 500 MG tablet Take 500 mg by mouth 2 (two) times daily. 03/16/15  Yes Historical Provider, MD  oxyCODONE-acetaminophen (PERCOCET) 5-325 MG per tablet Take 1 tablet by mouth every 8 (eight) hours as needed for severe pain. 12/28/13  Yes Lucia Gaskins, MD  pantoprazole (PROTONIX) 40 MG tablet Take 1 tablet (40 mg total) by mouth daily at 12 noon. Patient taking differently: Take 40 mg by mouth daily.  12/28/13  Yes Lucia Gaskins, MD   BP 121/88 mmHg  Pulse 129  Temp(Src) 98.4 F (36.9 C) (Oral)  Resp 22  Ht 5\' 6"  (1.676 m)  Wt 170 lb (77.111 kg)  BMI 27.45 kg/m2  SpO2 95% Physical Exam  Constitutional:  She is oriented to person, place, and time. She appears well-developed. She appears distressed (She is uncomfortable).  Appears older than stated age  HENT:  Head: Normocephalic and atraumatic.  Right Ear: External ear normal.  Left Ear: External ear normal.  Eyes: Conjunctivae and EOM are normal. Pupils are equal, round, and reactive to light.  Neck: Normal range of motion and phonation normal. Neck supple.  Cardiovascular: Normal rate, regular rhythm and normal heart sounds.   No palpable pulse right ankle or right foot. No pulse obtained with Doppler in right  posterior tibial and dorsalis pedis region. Pulse with Doppler in the right popliteal region.  Pulmonary/Chest: Effort normal and breath sounds normal.  She exhibits no bony tenderness.  Abdominal: Soft. There is no tenderness.  Musculoskeletal: Normal range of motion.  Right foot is cool relative to left, and she is unable to wiggle toes or move ankle  Neurological: She is alert and oriented to person, place, and time. No cranial nerve deficit or sensory deficit. She exhibits normal muscle tone. Coordination normal.  Skin: Skin is warm, dry and intact.  Psychiatric: She has a normal mood and affect. Her behavior is normal. Judgment and thought content normal.  Nursing note and vitals reviewed.   ED Course  Procedures (including critical care time)  Medications - No data to display  Patient Vitals for the past 24 hrs:  BP Temp Temp src Pulse Resp SpO2 Height Weight  04/09/15 1552 121/88 mmHg - - (!) 129 22 95 % - -  04/09/15 1457 - - - 114 - - - -  04/09/15 1454 (!) 151/122 mmHg 98.4 F (36.9 C) Oral (!) 141 18 94 % 5\' 6"  (1.676 m) 170 lb (77.111 kg)   16:00- case discussed with Dr. Darl Householder, at [facility] emergency department. He accepts patient in transfer for further evaluation and treatment as indicated. The patient will require, urgent evaluation by vascular surgery, and may require, urgent Doppler studies of venous and arterial circulation of the right lower leg.  16:05- page placed for vascular surgery, to help with evaluation and treatment. Dr. Trula Slade, will see pt. At [facility] ED. Charge nurse at [facility] ED updated. 16:24   4:25 PM Reevaluation with update and discussion. After initial assessment and treatment, an updated evaluation reveals she remains comfortable but somewhat better after morphine.[name] L   CRITICAL CARE Performed by: Richarda Blade Total critical care time: 35 minutes Critical care time was exclusive of separately billable procedures and treating other patients. Critical care was necessary to treat or prevent imminent or life-threatening deterioration. Critical care was time spent personally by me on the  following activities: development of treatment plan with patient and/or surrogate as well as nursing, discussions with consultants, evaluation of patient's response to treatment, examination of patient, obtaining history from patient or surrogate, ordering and performing treatments and interventions, ordering and review of laboratory studies, ordering and review of radiographic studies, pulse oximetry and re-evaluation of patient's condition.   Labs Review Labs Reviewed  CBC WITH DIFFERENTIAL/PLATELET - Abnormal; Notable for the following:    WBC 14.4 (*)    RBC 5.85 (*)    Hemoglobin 19.1 (*)    HCT 54.6 (*)    Neutro Abs 10.0 (*)    Monocytes Absolute 1.4 (*)    All other components within normal limits  BASIC METABOLIC PANEL - Abnormal; Notable for the following:    Sodium 131 (*)    Potassium 5.5 (*)    Chloride 93 (*)  Glucose, Bld 132 (*)    BUN 25 (*)    Creatinine, Ser 1.26 (*)    GFR calc non Af Amer 44 (*)    GFR calc Af Amer 51 (*)    All other components within normal limits    Imaging Review No results found.   EKG Interpretation   Date/Time:  Sunday April 09 2015 14:56:29 EDT Ventricular Rate:  142 PR Interval:    QRS Duration: 100 QT Interval:  310 QTC Calculation: 476 R Axis:   -43 Text Interpretation: Critical Test Result: Arrhythmia  Supraventricular tachycardia with frequent Premature ventricular complexes  Left axis deviation Anterolateral infarct , age undetermined Abnormal ECG  Since last tracing rate same and T wave abnormality is improved Confirmed  by Eulis Foster  MD, Jeramia Saleeby [phone]) on 04/09/2015 3:02:07 PM      MDM   Final diagnoses:  Pain of left lower extremity  Ischemic leg  Tachycardia  Hyperkalemia    Pulseless right lower leg with swelling, calf, consistent with at least arterial insufficiency, likely multifactorial, and possible DVT. She does have tachycardia aureus, regular. There are no known foci for thrombi. There is no apparent  cellulitis. Patient requires urgent evaluation, for intervention, based on the evaluation. She required transfer to a higher level of care.   Nursing Notes Reviewed/ Care Coordinated, and agree without changes. Applicable Imaging Reviewed.  Interpretation of Laboratory Data incorporated into ED treatment   Plan:- Transferred to [place] for further evaluation and treatment.    Debra Bo, MD 04/09/15 2212

## 2015-04-09 NOTE — ED Notes (Signed)
EMS left with patient approx 1630

## 2015-04-09 NOTE — ED Notes (Signed)
Vascular tech at bedside. °

## 2015-04-09 NOTE — ED Notes (Signed)
Pt a transfer from [facility] for eval of right left pain since Thursday and to see vascular surgeon, pt received 2mg  of morphine via EMS. Nad noted by EMS.   Upon arrival this RN noted no palpable pulses to right dorsalis pedis, attempted doppler of pt foot with no pulse noted. Pt RLQ noted to be cool to touch and swollen, tender to touch. Decreases sensation to ankle down to right foot. Pt denies any sob or cp at this time. Pt noted to be ST on monitor. Family at bedside. MD at bedside.

## 2015-04-09 NOTE — H&P (Signed)
History and Physical    EVADENE WARDRIP IPJ:[id number] DOB: [birth year] DOA: 04/09/2015  Referring physician: Dr. Ralene Bathe PCP: Maricela Curet, MD  Specialists: Dr. Trula Slade, vascular surgery   Chief Complaint: right leg pain  HPI: [name] is a 64 y.o. female has a past medical history significant for hypertension, COPD, hyperlipidemia, tobacco abuse, presents to the emergency room with a chief complaint of right lower extremity pain for the past few days. Her pain actually started this Thursday night, it was in the calf area, he has progressively gotten worse and she was unable to ambulate anymore. She presented to [facility], she was appreciated to have critical lower extremity ischemia with the absence of pulses, and was directed to [facility] emergency room for vas per surgery consult. She was seen here by Dr. Trula Slade from vascular surgery, and will plan for angiography in the morning, and given renal failure and tachycardia hospitalist was asked to admit. Patient denies any chest pain, denies any palpitations, she denies any shortness of breath, she denies any cough or chest congestion. She denies any abdominal pain, nausea, vomiting or diarrhea. She denies any fever or chills. She continues to smoke. I she also endorses a poor appetite over the last couple of days due to severe right lower extremity pain. She endorses being on Keflex for a few days due to a tooth abscess, and this was started by her PCP Dr. Cindie Laroche. In the ED, she is afebrile, her blood pressure is normal, she is however tachycardic to 120s, sinus rhythm. she was found to be mildly dehydrated with AKI, has a leukocytosis of 14, and she is hemoconcentrated with a hemoglobin of 19.   Review of Systems: as per history of present illness, otherwise 10 point review of system negative   Past Medical History  Diagnosis Date  . Hypertension   . Diabetes mellitus without complication   . COPD (chronic obstructive pulmonary  disease)   . Stroke   . Chronic back pain    Past Surgical History  Procedure Laterality Date  . Back fusion    . Cholecystectomy    . Foot surgery Bilateral   . Cardiac catheterization     Social History:  reports that she has been smoking Cigarettes.  She has a 60 pack-year smoking history. She has never used smokeless tobacco. She reports that she does not drink alcohol or use illicit drugs.  Allergies  Allergen Reactions  . Penicillins Other (See Comments)    Unknown reaction  . Ciprofloxacin Hives    Family History  Problem Relation Age of Onset  . Cancer Father    her sister had a heart attack at the age of 38   Prior to Admission medications   Medication Sig Start Date End Date Taking? Authorizing Provider  ALPRAZolam (XANAX) 0.25 MG tablet Take 0.25 mg by mouth 4 (four) times daily as needed for anxiety.    Yes Historical Provider, MD  cephALEXin (KEFLEX) 500 MG capsule Take 500 mg by mouth 4 (four) times daily. For 7 days(started 04/07/15)   Yes Historical Provider, MD  citalopram (CELEXA) 20 MG tablet Take 20 mg by mouth daily. 07/04/13  Yes Historical Provider, MD  Insulin Glargine (LANTUS SOLOSTAR) 100 UNIT/ML Solostar Pen Inject 34 Units into the skin at bedtime.   Yes Historical Provider, MD  lisinopril (PRINIVIL,ZESTRIL) 20 MG tablet Take 20 mg by mouth daily.   Yes Historical Provider, MD  metFORMIN (GLUCOPHAGE) 500 MG tablet Take 500  mg by mouth 2 (two) times daily with a meal.   Yes Historical Provider, MD  naproxen (NAPROSYN) 500 MG tablet Take 500 mg by mouth 2 (two) times daily. 03/16/15  Yes Historical Provider, MD  oxyCODONE-acetaminophen (PERCOCET) 5-325 MG per tablet Take 1 tablet by mouth every 8 (eight) hours as needed for severe pain. 12/28/13  Yes Lucia Gaskins, MD  pantoprazole (PROTONIX) 40 MG tablet Take 1 tablet (40 mg total) by mouth daily at 12 noon. Patient taking differently: Take 40 mg by mouth daily.  12/28/13  Yes Lucia Gaskins, MD    Physical Exam: Filed Vitals:   04/09/15 1745 04/09/15 1800 04/09/15 1815 04/09/15 1830  BP: 146/84 133/81 121/71 120/65  Pulse: 132 133 131 125  Temp:      TempSrc:      Resp: 18 11 14 13   Height:      Weight:      SpO2: 89% 89% 92% 89%     General:  appears in pain   Eyes: PERRL, EOMI, no scleral icterus  ENT:dry oropharynx  Neck: supple, no lymphadenopathy  Cardiovascular: regular rate without MRG; 2+ peripheral pulses except for the right lower extremity were I could not palpate a pulse, no JVD, no peripheral edema  Respiratory: CTA biL, good air movement without wheezing, overall distant breath sounds   Abdomen: soft, non tender to palpation, positive bowel sounds, no guarding, no rebound  Skin: no rashes  Musculoskeletal: normal bulk and tone, no joint swelling, tenderness to palpation on the right calf, right lower extremity is cold   Psychiatric: normal mood and affect  Neurologic: non focal, decreased sensation right lower foot  Labs on Admission:  Basic Metabolic Panel:  Recent Labs Lab 04/09/15 1535  NA 131*  K 5.5*  CL 93*  CO2 25  GLUCOSE 132*  BUN 25*  CREATININE 1.26*  CALCIUM 10.0   CBC:  Recent Labs Lab 04/09/15 1535  WBC 14.4*  NEUTROABS 10.0*  HGB 19.1*  HCT 54.6*  MCV 93.3  PLT 249    Radiological Exams on Admission: Dg Chest Port 1 View  04/09/2015   CLINICAL DATA:  Right calf red and swollen, severe pain  EXAM: PORTABLE CHEST - 1 VIEW  COMPARISON:  08/10/2010  FINDINGS: The heart size and mediastinal contours are within normal limits. Both lungs are clear. The visualized skeletal structures are unremarkable.  IMPRESSION: No active disease.   Electronically Signed   By: Skipper Cliche M.D.   On: 04/09/2015 17:16    EKG: Independently reviewed. sinus tachycardia   Assessment/Plan Active Problems:   Tachycardia   Dehydration   COPD (chronic obstructive pulmonary disease)   Essential hypertension, benign   Diabetes    Lower limb ischemia   Critical lower limb ischemia   AKI (acute kidney injury)   Hyperkalemia   Right lower limb ischemia - vascular surgery consulted by EDP, plan for angiography in the morning - heparin gtt - admit to SDU given tachycardia - pain control with IV dilaudid - obtain CK  AKI - likely in the setting of dehydration, will obtain CK levels to evaluate for rhabdo - hydration, repeat BMP in am - hold lisinopril  Hyperkalemia - likely due to AKI and potential rhabdo, CK pending - hydration, low dose kayexalate  - hold lisinopril  COPD - no wheezing, no evidence of exacerbation - Xopenex prn - CXR clear  Sinus tachycardia - likely due to dehydration, uncontrolled pain - IV fluids, pain control, closely monitor in  SDU  HTN - hold Lisinopril for now, monitor  Hyponatremia - due to dehdyration, repeat BMP in am  DM - obtain A1C, Lantus at lower dose of 25, SSI - hold Metformin  Recent tooth abscess  - improved, continue Keflex as per outpatient  Tobacco abuse - counseled for cessation   Diet: carb modified, NPO past midnight Fluids: NS DVT Prophylaxis: heparin gtt  Code Status: Full  Family Communication: d/w husband bedside  Disposition Plan: admit to SDU  Time spent: 20  Daisee Centner M. Cruzita Lederer, MD [facility] Pager [phone]  If 7PM-7AM, please contact night-coverage [email or web address] Password [facility] 04/09/2015, 6:53 PM

## 2015-04-09 NOTE — Consult Note (Signed)
Consult Note  Patient name: [name] MRN: [id number] DOB: [birth year] Sex: female  Consulting Physician:  ER   Reason for Consult:  Chief Complaint  Patient presents with  . Foot Pain    HISTORY OF PRESENT ILLNESS: This is a 64 year old female who presented earlier to [facility] with pain in her right foot which began this past Thursday night.  She tried to suffer through this but then had trouble walking earlier today and therefore went to the emergency department.  She has trouble wiggling her toes and has decreased sensation.  The patient has a history of vascular disease, having undergone bilateral carotid endarterectomy by Dr. Kellie Simmering.  She is medically managed for hypertension.  She suffers from diabetes which is poorly controlled.  She is a smoker.  Past Medical History  Diagnosis Date  . Hypertension   . Diabetes mellitus without complication   . COPD (chronic obstructive pulmonary disease)   . Stroke   . Chronic back pain     Past Surgical History  Procedure Laterality Date  . Back fusion    . Cholecystectomy    . Foot surgery Bilateral   . Cardiac catheterization      History   Social History  . Marital Status: Married    Spouse Name: N/A  . Number of Children: N/A  . Years of Education: N/A   Occupational History  . Not on file.   Social History Main Topics  . Smoking status: Current Every Day Smoker -- 1.50 packs/day for 40 years    Types: Cigarettes  . Smokeless tobacco: Never Used  . Alcohol Use: No  . Drug Use: No  . Sexual Activity: Not on file   Other Topics Concern  . Not on file   Social History Narrative    Family History  Problem Relation Age of Onset  . Cancer Father     Allergies as of 04/09/2015 - Review Complete 04/09/2015  Allergen Reaction Noted  . Penicillins Other (See Comments) 07/17/2012  . Ciprofloxacin Hives 04/09/2015    No current facility-administered medications on file prior to encounter.     Current Outpatient Prescriptions on File Prior to Encounter  Medication Sig Dispense Refill  . ALPRAZolam (XANAX) 0.25 MG tablet Take 0.25 mg by mouth 4 (four) times daily as needed for anxiety.     . citalopram (CELEXA) 20 MG tablet Take 20 mg by mouth daily.    Marland Kitchen lisinopril (PRINIVIL,ZESTRIL) 20 MG tablet Take 20 mg by mouth daily.    Marland Kitchen oxyCODONE-acetaminophen (PERCOCET) 5-325 MG per tablet Take 1 tablet by mouth every 8 (eight) hours as needed for severe pain. 30 tablet 0  . pantoprazole (PROTONIX) 40 MG tablet Take 1 tablet (40 mg total) by mouth daily at 12 noon. (Patient taking differently: Take 40 mg by mouth daily. ) 30 tablet 2     REVIEW OF SYSTEMS: Cardiovascular: No chest pain, chest pressure, palpitations, orthopnea, or dyspnea on exertion. No history of DVT or phlebitis.  Positive right foot pain Pulmonary: No productive cough, asthma or wheezing. Neurologic: No weakness, paresthesias, aphasia, or amaurosis. No dizziness. Hematologic: No bleeding problems or clotting disorders. Musculoskeletal: No joint pain or joint swelling. Gastrointestinal: No blood in stool or hematemesis Genitourinary: No dysuria or hematuria. Psychiatric:: No history of major depression. Integumentary: No rashes or ulcers. Constitutional: No fever or chills.  PHYSICAL EXAMINATION: General: The patient appears their stated age.  Vital signs are BP  119/87 mmHg  Pulse 129  Temp(Src) 99.4 F (37.4 C) (Oral)  Resp 16  Ht 5\' 6"  (1.676 m)  Wt 170 lb (77.111 kg)  BMI 27.45 kg/m2  SpO2 94% Pulmonary: Respirations are non-labored HEENT:  No gross abnormalities Abdomen: Soft and non-tender  Musculoskeletal: There are no major deformities.   Neurologic: Decreased sensation of the right leg beginning at the ankle.  Decreased motor function of the right foot, Skin: The skin on the right foot is cool to the touch.  There is no mottling or blistering or bluish discoloration. Psychiatric: The patient has  normal affect. Cardiovascular: Tachycardia.  Palpable femoral pulses bilaterally.  There is a brisk left posterior tibial Doppler signal.  No audible signals are identified in the right foot.  There is a biphasic right popliteal signal  Diagnostic Studies: none    Assessment:  Right foot pain Plan: The patient does have mild swelling and calf tenderness in the right leg, therefore a venous ultrasound will be ordered to rule out DVT.  However, I think that the patient is suffering from right lower extremity ischemia.  This has been going on for at least 72 hours.  On examination no audible Doppler signal is identified in the foot, however there is a biphasic signal in the popliteal artery.  Clinically the foot does not look mottled.  There is actually a pink color, however she has diminished motor and sensory function.  Given the duration of ischemia (see 72 hours) and with a popliteal signal, I suspect that she either has a thromboembolus and her tibial vessels or distal popliteal artery, or she has had progression of chronic disease secondary to her smoking and poorly controlled diabetes.  I discussed with the patient that she is at risk for amputation.  The patient is dehydrated with acute renal failure and tachycardia to the 130s.  Therefore, I would recommend admission to the hospital with IV fluid hydration and IV heparinization with plans for angiography in the morning and revascularization once the studies have been completed.  I do not think that the patient needs to be taken emergently to the operating room currently given the appearance of her foot.  Hopefully she will get some relief tonight with heparin and narcotics     V. Leia Alf, M.D. [facility] [place] Office: [phone] Pager:  [phone]

## 2015-04-09 NOTE — ED Notes (Signed)
Pt c/o cramp in right foot that radiated to knee starting Thursday. Pain has gotten increasingly worse. Pt unable to walk on it at all due to the pain. Pt's foot slightly red.

## 2015-04-09 NOTE — Progress Notes (Signed)
VASCULAR LAB PRELIMINARY  PRELIMINARY  PRELIMINARY  PRELIMINARY  Right lower extremity venous Doppler completed.    Preliminary report:  There is no DVT or SVT noted in the right lower extremity.  Incidentally, there is NO flow noted in the right posterior tibial or anterior tibial artery by duplex.  Hand held Doppler also confirms no audible blood flow in the right PT, AT, or DP.  Marki Frede, RVT 04/09/2015, 7:11 PM

## 2015-04-09 NOTE — ED Notes (Signed)
Right calf red, swollen, tight. Per severe patient pain started Tuesday which impeded walking. Foot and calf cool to touch. Unable to doppler pulses in right foot, right knee asymp.

## 2015-04-09 NOTE — ED Notes (Signed)
Vascular to do bedside US of leg due to pt tachycardia.

## 2015-04-09 NOTE — Progress Notes (Signed)
ANTICOAGULATION CONSULT NOTE - Initial Consult  Pharmacy Consult for Heparin Indication: Limb ischemia  Allergies  Allergen Reactions  . Penicillins Other (See Comments)    Unknown reaction  . Ciprofloxacin Hives    Patient Measurements: Height: 5\' 6"  (167.6 cm) Weight: 170 lb (77.111 kg) IBW/kg (Calculated) : 59.3 Heparin Dosing Weight: 75 kg  Vital Signs: Temp: 98.1 F (36.7 C) (07/24 1611) Temp Source: Oral (07/24 1611) BP: 130/82 mmHg (07/24 1611) Pulse Rate: 132 (07/24 1611)  Labs:  Recent Labs  04/09/15 1535  HGB 19.1*  HCT 54.6*  PLT 249  APTT 27  CREATININE 1.26*    Estimated Creatinine Clearance: 47.3 mL/min (by C-G formula based on Cr of 1.26).   Medical History: Past Medical History  Diagnosis Date  . Hypertension   . Diabetes mellitus without complication   . COPD (chronic obstructive pulmonary disease)   . Stroke   . Chronic back pain     Assessment: 1 YOF who presented to [facility] on 7/24 with RLE ischemia - swollen and cool to the touch with decreased sensation. Dopplers have yet to done to r/o DVT. Pharmacy consulted to start heparin for limb ischemia. Hep Wt: 75 kg.   The patient was not noted to be on any anticoagulant PTA, baseline CBC okay.   Goal of Therapy:  Heparin level 0.3-0.7 units/ml Monitor platelets by anticoagulation protocol: Yes   Plan:  1. Heparin 4000 unit bolus x 1 2. Start heparin at a drip rate of 1200 units/hr (12 ml/hr) 3. Will continue to monitor for any signs/symptoms of bleeding and will follow up with heparin level in 6 hours   Alycia Rossetti, PharmD, BCPS Clinical Pharmacist Pager: [phone] 04/09/2015 5:16 PM

## 2015-04-09 NOTE — ED Notes (Signed)
EMS here all paperwork including EMTALA given to transport.

## 2015-04-09 NOTE — ED Notes (Signed)
Spoke with Camera operator, Darci Current at [facility]. Report of patients condition given. Dr. Darl Householder accepting EDP. EMS in route to our location for transport to [facility].

## 2015-04-10 ENCOUNTER — Encounter (HOSPITAL_COMMUNITY): Payer: Self-pay | Admitting: Anesthesiology

## 2015-04-10 ENCOUNTER — Encounter (HOSPITAL_COMMUNITY)
Admission: EM | Disposition: A | Discharge status: Home Health | Payer: Self-pay | Source: Home / Self Care | Attending: Internal Medicine

## 2015-04-10 ENCOUNTER — Inpatient Hospital Stay (HOSPITAL_COMMUNITY): Payer: Medicare Other | Admitting: Anesthesiology

## 2015-04-10 DIAGNOSIS — Z720 Tobacco use: Secondary | ICD-10-CM

## 2015-04-10 DIAGNOSIS — I10 Essential (primary) hypertension: Secondary | ICD-10-CM

## 2015-04-10 DIAGNOSIS — J410 Simple chronic bronchitis: Secondary | ICD-10-CM

## 2015-04-10 DIAGNOSIS — I998 Other disorder of circulatory system: Secondary | ICD-10-CM

## 2015-04-10 HISTORY — PX: FEMORAL-POPLITEAL BYPASS GRAFT: SHX937

## 2015-04-10 HISTORY — PX: THROMBECTOMY FEMORAL ARTERY: SHX6406

## 2015-04-10 HISTORY — PX: INTRAOPERATIVE ARTERIOGRAM: SHX5157

## 2015-04-10 HISTORY — PX: PERIPHERAL VASCULAR CATHETERIZATION: SHX172C

## 2015-04-10 HISTORY — PX: FASCIOTOMY: SHX132

## 2015-04-10 LAB — URINALYSIS, ROUTINE W REFLEX MICROSCOPIC
Bilirubin Urine: NEGATIVE
Glucose, UA: >1000 mg/dL — AB
KETONES UR: 15 mg/dL — AB
LEUKOCYTES UA: NEGATIVE
Nitrite: NEGATIVE
PH: 5 (ref 5.0–8.0)
Protein, ur: 30 mg/dL — AB
Specific Gravity, Urine: >1.046 — ABNORMAL HIGH (ref 1.005–1.030)
Urobilinogen, UA: 1 mg/dL (ref 0.0–1.0)

## 2015-04-10 LAB — CBC
HCT: 52.4 % — ABNORMAL HIGH (ref 36.0–46.0)
Hemoglobin: 17.9 g/dL — ABNORMAL HIGH (ref 12.0–15.0)
MCH: 32.2 pg (ref 26.0–34.0)
MCHC: 34.2 g/dL (ref 30.0–36.0)
MCV: 94.2 fL (ref 78.0–100.0)
Platelets: 214 10*3/uL (ref 150–400)
RBC: 5.56 MIL/uL — ABNORMAL HIGH (ref 3.87–5.11)
RDW: 13.7 % (ref 11.5–15.5)
WBC: 10.6 10*3/uL — ABNORMAL HIGH (ref 4.0–10.5)

## 2015-04-10 LAB — BASIC METABOLIC PANEL
Anion gap: 7 (ref 5–15)
BUN: 20 mg/dL (ref 6–20)
CO2: 26 mmol/L (ref 22–32)
Calcium: 8.4 mg/dL — ABNORMAL LOW (ref 8.9–10.3)
Chloride: 93 mmol/L — ABNORMAL LOW (ref 101–111)
Creatinine, Ser: 1.15 mg/dL — ABNORMAL HIGH (ref 0.44–1.00)
GFR calc Af Amer: 57 mL/min — ABNORMAL LOW (ref >60)
GFR calc non Af Amer: 49 mL/min — ABNORMAL LOW (ref >60)
GLUCOSE: 172 mg/dL — AB (ref 65–99)
Potassium: 4.6 mmol/L (ref 3.5–5.1)
Sodium: 126 mmol/L — ABNORMAL LOW (ref 135–145)

## 2015-04-10 LAB — GLUCOSE, CAPILLARY
GLUCOSE-CAPILLARY: 103 mg/dL — AB (ref 65–99)
GLUCOSE-CAPILLARY: 110 mg/dL — AB (ref 65–99)
GLUCOSE-CAPILLARY: 121 mg/dL — AB (ref 65–99)
GLUCOSE-CAPILLARY: 142 mg/dL — AB (ref 65–99)
GLUCOSE-CAPILLARY: 59 mg/dL — AB (ref 65–99)
Glucose-Capillary: 97 mg/dL (ref 65–99)

## 2015-04-10 LAB — URINE MICROSCOPIC-ADD ON

## 2015-04-10 LAB — HEPARIN LEVEL (UNFRACTIONATED)
Heparin Unfractionated: 0.59 IU/mL (ref 0.30–0.70)
Heparin Unfractionated: 0.78 IU/mL — ABNORMAL HIGH (ref 0.30–0.70)

## 2015-04-10 LAB — URIC ACID: Uric Acid, Serum: 6.2 mg/dL (ref 2.3–6.6)

## 2015-04-10 LAB — OSMOLALITY: Osmolality: 287 mOsm/kg (ref 275–300)

## 2015-04-10 LAB — CREATININE, URINE, RANDOM: CREATININE, URINE: 45.37 mg/dL

## 2015-04-10 LAB — SODIUM, URINE, RANDOM: Sodium, Ur: 118 mmol/L

## 2015-04-10 SURGERY — ABDOMINAL AORTOGRAM W/LOWER EXTREMITY
Anesthesia: LOCAL

## 2015-04-10 SURGERY — THROMBECTOMY, ARTERY, FEMORAL
Anesthesia: General | Site: Leg Lower | Laterality: Right

## 2015-04-10 MED ORDER — MIDAZOLAM HCL 5 MG/5ML IJ SOLN
INTRAMUSCULAR | Status: DC | PRN
  Administered 2015-04-10 (×2): 1 mg via INTRAVENOUS

## 2015-04-10 MED ORDER — ONDANSETRON HCL 4 MG/2ML IJ SOLN
INTRAMUSCULAR | Status: DC | PRN
  Administered 2015-04-10: 4 mg via INTRAVENOUS

## 2015-04-10 MED ORDER — ALBUTEROL SULFATE HFA 108 (90 BASE) MCG/ACT IN AERS
INHALATION_SPRAY | RESPIRATORY_TRACT | Status: DC | PRN
  Administered 2015-04-10 (×2): 8 via RESPIRATORY_TRACT

## 2015-04-10 MED ORDER — ROCURONIUM BROMIDE 100 MG/10ML IV SOLN
INTRAVENOUS | Status: DC | PRN
  Administered 2015-04-10: 30 mg via INTRAVENOUS
  Administered 2015-04-10 (×3): 10 mg via INTRAVENOUS

## 2015-04-10 MED ORDER — LACTATED RINGERS IV SOLN
INTRAVENOUS | Status: DC
  Administered 2015-04-10: 14:00:00 via INTRAVENOUS

## 2015-04-10 MED ORDER — METOPROLOL TARTRATE 50 MG PO TABS
50.0000 mg | ORAL_TABLET | Freq: Two times a day (BID) | ORAL | Status: DC
  Filled 2015-04-10 (×2): qty 1

## 2015-04-10 MED ORDER — ONDANSETRON HCL 4 MG/2ML IJ SOLN
INTRAMUSCULAR | Status: AC
  Filled 2015-04-10: qty 2

## 2015-04-10 MED ORDER — NEOSTIGMINE METHYLSULFATE 10 MG/10ML IV SOLN
INTRAVENOUS | Status: AC
  Filled 2015-04-10: qty 2

## 2015-04-10 MED ORDER — ROCURONIUM BROMIDE 50 MG/5ML IV SOLN
INTRAVENOUS | Status: AC
  Filled 2015-04-10: qty 1

## 2015-04-10 MED ORDER — DEXTROSE 50 % IV SOLN
INTRAVENOUS | Status: AC
  Filled 2015-04-10: qty 50

## 2015-04-10 MED ORDER — ONDANSETRON HCL 4 MG/2ML IJ SOLN
4.0000 mg | Freq: Four times a day (QID) | INTRAMUSCULAR | Status: DC | PRN
  Administered 2015-04-10: 4 mg via INTRAVENOUS
  Filled 2015-04-10: qty 2

## 2015-04-10 MED ORDER — ACETAMINOPHEN 325 MG PO TABS: 650.0000 mg | ORAL_TABLET | ORAL | Status: DC | PRN

## 2015-04-10 MED ORDER — 0.9 % SODIUM CHLORIDE (POUR BTL) OPTIME
TOPICAL | Status: DC | PRN
  Administered 2015-04-10: 1000 mL

## 2015-04-10 MED ORDER — METOPROLOL TARTRATE 50 MG PO TABS
50.0000 mg | ORAL_TABLET | Freq: Two times a day (BID) | ORAL | Status: DC
  Administered 2015-04-11: 50 mg via ORAL
  Filled 2015-04-10 (×2): qty 1

## 2015-04-10 MED ORDER — NEOSTIGMINE METHYLSULFATE 10 MG/10ML IV SOLN
INTRAVENOUS | Status: DC | PRN
  Administered 2015-04-10: 5 mg via INTRAVENOUS

## 2015-04-10 MED ORDER — GUAIFENESIN-DM 100-10 MG/5ML PO SYRP: 15.0000 mL | ORAL_SOLUTION | ORAL | Status: DC | PRN

## 2015-04-10 MED ORDER — VANCOMYCIN HCL IN DEXTROSE 1-5 GM/200ML-% IV SOLN
1000.0000 mg | Freq: Two times a day (BID) | INTRAVENOUS | Status: AC
  Administered 2015-04-10 – 2015-04-11 (×2): 1000 mg via INTRAVENOUS
  Filled 2015-04-10 (×2): qty 200

## 2015-04-10 MED ORDER — MAGNESIUM SULFATE 2 GM/50ML IV SOLN
2.0000 g | Freq: Every day | INTRAVENOUS | Status: DC | PRN
  Filled 2015-04-10: qty 50

## 2015-04-10 MED ORDER — MIDAZOLAM HCL 2 MG/2ML IJ SOLN
INTRAMUSCULAR | Status: AC
  Filled 2015-04-10: qty 2

## 2015-04-10 MED ORDER — PROTAMINE SULFATE 10 MG/ML IV SOLN
INTRAVENOUS | Status: DC | PRN
  Administered 2015-04-10: 10 mg via INTRAVENOUS
  Administered 2015-04-10 (×2): 20 mg via INTRAVENOUS
  Administered 2015-04-10: 30 mg via INTRAVENOUS

## 2015-04-10 MED ORDER — LIDOCAINE HCL (PF) 1 % IJ SOLN
INTRAMUSCULAR | Status: DC | PRN
  Administered 2015-04-10: 10 mL

## 2015-04-10 MED ORDER — ENOXAPARIN SODIUM 30 MG/0.3ML ~~LOC~~ SOLN
30.0000 mg | SUBCUTANEOUS | Status: DC
  Administered 2015-04-11: 30 mg via SUBCUTANEOUS
  Filled 2015-04-10 (×2): qty 0.3

## 2015-04-10 MED ORDER — BISACODYL 10 MG RE SUPP: 10.0000 mg | Freq: Every day | RECTAL | Status: DC | PRN

## 2015-04-10 MED ORDER — ONDANSETRON HCL 4 MG/2ML IJ SOLN: 4.0000 mg | Freq: Once | INTRAMUSCULAR | Status: DC | PRN

## 2015-04-10 MED ORDER — PHENYLEPHRINE HCL 10 MG/ML IJ SOLN
10.0000 mg | INTRAMUSCULAR | Status: DC | PRN
  Administered 2015-04-10 (×2): 10 ug/min via INTRAVENOUS

## 2015-04-10 MED ORDER — SENNOSIDES-DOCUSATE SODIUM 8.6-50 MG PO TABS
1.0000 | ORAL_TABLET | Freq: Every evening | ORAL | Status: DC | PRN
Start: 2015-04-10 — End: 2015-04-15
  Filled 2015-04-10: qty 1

## 2015-04-10 MED ORDER — MORPHINE SULFATE 2 MG/ML IJ SOLN
2.0000 mg | INTRAMUSCULAR | Status: DC | PRN
  Administered 2015-04-10 – 2015-04-15 (×6): 2 mg via INTRAVENOUS
  Filled 2015-04-10 (×7): qty 1

## 2015-04-10 MED ORDER — DOCUSATE SODIUM 100 MG PO CAPS
100.0000 mg | ORAL_CAPSULE | Freq: Every day | ORAL | Status: DC
  Administered 2015-04-11 – 2015-04-15 (×4): 100 mg via ORAL
  Filled 2015-04-10 (×5): qty 1

## 2015-04-10 MED ORDER — SODIUM CHLORIDE 0.9 % IV SOLN
INTRAVENOUS | Status: DC
  Administered 2015-04-10: 21:00:00 via INTRAVENOUS

## 2015-04-10 MED ORDER — PHENOL 1.4 % MT LIQD: 1.0000 | OROMUCOSAL | Status: DC | PRN

## 2015-04-10 MED ORDER — FENTANYL CITRATE (PF) 100 MCG/2ML IJ SOLN
INTRAMUSCULAR | Status: AC
  Filled 2015-04-10: qty 2

## 2015-04-10 MED ORDER — EPHEDRINE SULFATE 50 MG/ML IJ SOLN
INTRAMUSCULAR | Status: DC | PRN
Start: 2015-04-10 — End: 2015-04-10
  Administered 2015-04-10 (×2): 2.5 mg via INTRAVENOUS
  Administered 2015-04-10 (×6): 5 mg via INTRAVENOUS

## 2015-04-10 MED ORDER — IODIXANOL 320 MG/ML IV SOLN
INTRAVENOUS | Status: DC | PRN
  Administered 2015-04-10: 45 mL via INTRA_ARTERIAL

## 2015-04-10 MED ORDER — DEXAMETHASONE SODIUM PHOSPHATE 4 MG/ML IJ SOLN
INTRAMUSCULAR | Status: DC | PRN
Start: 2015-04-10 — End: 2015-04-10
  Administered 2015-04-10: 4 mg via INTRAVENOUS

## 2015-04-10 MED ORDER — LACTATED RINGERS IV SOLN
INTRAVENOUS | Status: DC | PRN
  Administered 2015-04-10 (×2): via INTRAVENOUS

## 2015-04-10 MED ORDER — GLYCOPYRROLATE 0.2 MG/ML IJ SOLN
INTRAMUSCULAR | Status: DC | PRN
  Administered 2015-04-10: .8 mg via INTRAVENOUS

## 2015-04-10 MED ORDER — PROPOFOL 10 MG/ML IV BOLUS
INTRAVENOUS | Status: DC | PRN
  Administered 2015-04-10: 50 mg via INTRAVENOUS
  Administered 2015-04-10: 30 mg via INTRAVENOUS
  Administered 2015-04-10: 10 mg via INTRAVENOUS
  Administered 2015-04-10: 100 mg via INTRAVENOUS

## 2015-04-10 MED ORDER — FENTANYL CITRATE (PF) 250 MCG/5ML IJ SOLN
INTRAMUSCULAR | Status: AC
  Filled 2015-04-10: qty 5

## 2015-04-10 MED ORDER — METOPROLOL TARTRATE 1 MG/ML IV SOLN
5.0000 mg | INTRAVENOUS | Status: DC | PRN
  Administered 2015-04-10 – 2015-04-11 (×2): 5 mg via INTRAVENOUS
  Filled 2015-04-10 (×2): qty 5

## 2015-04-10 MED ORDER — LIDOCAINE HCL (CARDIAC) 20 MG/ML IV SOLN
INTRAVENOUS | Status: DC | PRN
  Administered 2015-04-10: 75 mg via INTRAVENOUS

## 2015-04-10 MED ORDER — SODIUM CHLORIDE 0.9 % IV SOLN: 500.0000 mL | Freq: Once | INTRAVENOUS | Status: AC | PRN

## 2015-04-10 MED ORDER — SUCCINYLCHOLINE CHLORIDE 20 MG/ML IJ SOLN
INTRAMUSCULAR | Status: AC
  Filled 2015-04-10: qty 1

## 2015-04-10 MED ORDER — HYDROMORPHONE HCL 1 MG/ML IJ SOLN
INTRAMUSCULAR | Status: AC
  Filled 2015-04-10: qty 1

## 2015-04-10 MED ORDER — POTASSIUM CHLORIDE CRYS ER 20 MEQ PO TBCR: 20.0000 meq | EXTENDED_RELEASE_TABLET | Freq: Every day | ORAL | Status: DC | PRN

## 2015-04-10 MED ORDER — LIDOCAINE HCL (PF) 1 % IJ SOLN
INTRAMUSCULAR | Status: AC
  Filled 2015-04-10: qty 30

## 2015-04-10 MED ORDER — HYDRALAZINE HCL 20 MG/ML IJ SOLN: 5.0000 mg | INTRAMUSCULAR | Status: DC | PRN

## 2015-04-10 MED ORDER — GLYCOPYRROLATE 0.2 MG/ML IJ SOLN
INTRAMUSCULAR | Status: AC
  Filled 2015-04-10: qty 3

## 2015-04-10 MED ORDER — THROMBIN 20000 UNITS EX SOLR
CUTANEOUS | Status: DC | PRN
  Administered 2015-04-10: 20 mL via TOPICAL

## 2015-04-10 MED ORDER — SUCCINYLCHOLINE CHLORIDE 20 MG/ML IJ SOLN
INTRAMUSCULAR | Status: DC | PRN
  Administered 2015-04-10: 120 mg via INTRAVENOUS

## 2015-04-10 MED ORDER — HEPARIN SODIUM (PORCINE) 1000 UNIT/ML IJ SOLN
INTRAMUSCULAR | Status: DC | PRN
  Administered 2015-04-10: 7000 [IU] via INTRAVENOUS
  Administered 2015-04-10: 8000 [IU] via INTRAVENOUS
  Administered 2015-04-10: 1000 [IU] via INTRAVENOUS

## 2015-04-10 MED ORDER — FENTANYL CITRATE (PF) 100 MCG/2ML IJ SOLN
INTRAMUSCULAR | Status: DC | PRN
  Administered 2015-04-10: 50 ug via INTRAVENOUS

## 2015-04-10 MED ORDER — HEPARIN SODIUM (PORCINE) 1000 UNIT/ML IJ SOLN
INTRAMUSCULAR | Status: AC
  Filled 2015-04-10: qty 1

## 2015-04-10 MED ORDER — LIDOCAINE HCL (CARDIAC) 20 MG/ML IV SOLN
INTRAVENOUS | Status: AC
  Filled 2015-04-10: qty 5

## 2015-04-10 MED ORDER — DEXTROSE 50 % IV SOLN
25.0000 mL | Freq: Once | INTRAVENOUS | Status: AC
Start: 2015-04-10 — End: 2015-04-10
  Administered 2015-04-10 (×2): .5 via INTRAVENOUS
  Filled 2015-04-10: qty 50

## 2015-04-10 MED ORDER — PHENYLEPHRINE HCL 10 MG/ML IJ SOLN
INTRAMUSCULAR | Status: DC | PRN
  Administered 2015-04-10 (×2): 80 ug via INTRAVENOUS

## 2015-04-10 MED ORDER — HYDROMORPHONE HCL 1 MG/ML IJ SOLN
0.2500 mg | INTRAMUSCULAR | Status: DC | PRN
  Administered 2015-04-10: 0.5 mg via INTRAVENOUS

## 2015-04-10 MED ORDER — CEFAZOLIN SODIUM-DEXTROSE 2-3 GM-% IV SOLR
INTRAVENOUS | Status: DC | PRN
  Administered 2015-04-10: 2 g via INTRAVENOUS

## 2015-04-10 MED ORDER — ASPIRIN 325 MG PO TABS
325.0000 mg | ORAL_TABLET | Freq: Every day | ORAL | Status: DC
  Administered 2015-04-11 – 2015-04-15 (×5): 325 mg via ORAL
  Filled 2015-04-10 (×5): qty 1

## 2015-04-10 MED ORDER — MEPERIDINE HCL 25 MG/ML IJ SOLN: 6.2500 mg | INTRAMUSCULAR | Status: DC | PRN

## 2015-04-10 MED ORDER — INSULIN ASPART 100 UNIT/ML ~~LOC~~ SOLN: 0.0000 [IU] | SUBCUTANEOUS | Status: DC

## 2015-04-10 MED ORDER — SODIUM CHLORIDE 0.9 % IR SOLN
Status: DC | PRN
  Administered 2015-04-10: 500 mL

## 2015-04-10 MED ORDER — ARTIFICIAL TEARS OP OINT
TOPICAL_OINTMENT | OPHTHALMIC | Status: AC
  Filled 2015-04-10: qty 3.5

## 2015-04-10 MED ORDER — SODIUM CHLORIDE 0.9 % IV SOLN: INTRAVENOUS | Status: DC

## 2015-04-10 MED ORDER — THROMBIN 20000 UNITS EX SOLR
CUTANEOUS | Status: AC
  Filled 2015-04-10: qty 20000

## 2015-04-10 MED ORDER — SODIUM CHLORIDE 0.9 % IV SOLN: 1.0000 mL/kg/h | INTRAVENOUS | Status: DC

## 2015-04-10 MED ORDER — INSULIN ASPART 100 UNIT/ML ~~LOC~~ SOLN
0.0000 [IU] | Freq: Three times a day (TID) | SUBCUTANEOUS | Status: DC
  Administered 2015-04-11: 2 [IU] via SUBCUTANEOUS
  Administered 2015-04-11 – 2015-04-12 (×3): 1 [IU] via SUBCUTANEOUS
  Administered 2015-04-13: 2 [IU] via SUBCUTANEOUS
  Administered 2015-04-13: 1 [IU] via SUBCUTANEOUS

## 2015-04-10 MED ORDER — PROTAMINE SULFATE 10 MG/ML IV SOLN
INTRAVENOUS | Status: AC
  Filled 2015-04-10: qty 10

## 2015-04-10 MED ORDER — MIDAZOLAM HCL 2 MG/2ML IJ SOLN
INTRAMUSCULAR | Status: DC | PRN
  Administered 2015-04-10: 1 mg via INTRAVENOUS

## 2015-04-10 MED ORDER — HEPARIN (PORCINE) IN NACL 2-0.9 UNIT/ML-% IJ SOLN
INTRAMUSCULAR | Status: AC
  Filled 2015-04-10: qty 1000

## 2015-04-10 MED ORDER — FENTANYL CITRATE (PF) 100 MCG/2ML IJ SOLN
INTRAMUSCULAR | Status: DC | PRN
Start: 2015-04-10 — End: 2015-04-10
  Administered 2015-04-10: 100 ug via INTRAVENOUS
  Administered 2015-04-10 (×2): 25 ug via INTRAVENOUS
  Administered 2015-04-10: 50 ug via INTRAVENOUS
  Administered 2015-04-10 (×2): 25 ug via INTRAVENOUS
  Administered 2015-04-10 (×3): 50 ug via INTRAVENOUS
  Administered 2015-04-10: 100 ug via INTRAVENOUS

## 2015-04-10 MED ORDER — ALUM & MAG HYDROXIDE-SIMETH 200-200-20 MG/5ML PO SUSP: 15.0000 mL | ORAL | Status: DC | PRN

## 2015-04-10 SURGICAL SUPPLY — 83 items
BAG BANDED W/RUBBER/TAPE 36X54 (MISCELLANEOUS) ×2 IMPLANT
BAG EQP BAND 135X91 W/RBR TAPE (MISCELLANEOUS) ×2
BANDAGE ELASTIC 6 VELCRO ST LF (GAUZE/BANDAGES/DRESSINGS) ×3 IMPLANT
BANDAGE ESMARK 6X9 LF (GAUZE/BANDAGES/DRESSINGS) IMPLANT
BLADE SAW RECIP 87.9 MT (BLADE) ×1 IMPLANT
BNDG CMPR 9X6 STRL LF SNTH (GAUZE/BANDAGES/DRESSINGS)
BNDG COHESIVE 6X5 TAN STRL LF (GAUZE/BANDAGES/DRESSINGS) ×1 IMPLANT
BNDG ESMARK 6X9 LF (GAUZE/BANDAGES/DRESSINGS)
BNDG GAUZE ELAST 4 BULKY (GAUZE/BANDAGES/DRESSINGS) ×4 IMPLANT
CANISTER SUCTION 2500CC (MISCELLANEOUS) ×3 IMPLANT
CANNULA VESSEL 3MM 2 BLNT TIP (CANNULA) ×2 IMPLANT
CATH EMB 3FR 80CM (CATHETERS) ×2 IMPLANT
CATH EMB 4FR 80CM (CATHETERS) ×2 IMPLANT
CATH EMB 5FR 80CM (CATHETERS) ×2 IMPLANT
CLIP TI MEDIUM 24 (CLIP) ×2 IMPLANT
CLIP TI MEDIUM 6 (CLIP) IMPLANT
CLIP TI WIDE RED SMALL 24 (CLIP) ×2 IMPLANT
COVER DOME SNAP 22 D (MISCELLANEOUS) ×2 IMPLANT
COVER SURGICAL LIGHT HANDLE (MISCELLANEOUS) ×1 IMPLANT
COVER TABLE BACK 60X90 (DRAPES) ×1 IMPLANT
CUFF TOURNIQUET SINGLE 18IN (TOURNIQUET CUFF) IMPLANT
CUFF TOURNIQUET SINGLE 24IN (TOURNIQUET CUFF) IMPLANT
CUFF TOURNIQUET SINGLE 34IN LL (TOURNIQUET CUFF) IMPLANT
CUFF TOURNIQUET SINGLE 44IN (TOURNIQUET CUFF) IMPLANT
DRAIN CHANNEL 19F RND (DRAIN) IMPLANT
DRAPE ORTHO SPLIT 77X108 STRL (DRAPES) ×3
DRAPE PROXIMA HALF (DRAPES) ×2 IMPLANT
DRAPE SURG ORHT 6 SPLT 77X108 (DRAPES) ×3 IMPLANT
DRSG ADAPTIC 3X8 NADH LF (GAUZE/BANDAGES/DRESSINGS) ×1 IMPLANT
DRSG PAD ABDOMINAL 8X10 ST (GAUZE/BANDAGES/DRESSINGS) ×2 IMPLANT
ELECT REM PT RETURN 9FT ADLT (ELECTROSURGICAL) ×3
ELECTRODE REM PT RTRN 9FT ADLT (ELECTROSURGICAL) ×2 IMPLANT
EVACUATOR SILICONE 100CC (DRAIN) IMPLANT
GAUZE SPONGE 4X4 12PLY STRL (GAUZE/BANDAGES/DRESSINGS) ×2 IMPLANT
GLOVE BIO SURGEON STRL SZ7.5 (GLOVE) ×5 IMPLANT
GLOVE BIOGEL PI IND STRL 6.5 (GLOVE) ×3 IMPLANT
GLOVE BIOGEL PI IND STRL 7.5 (GLOVE) ×2 IMPLANT
GLOVE BIOGEL PI IND STRL 8 (GLOVE) ×1 IMPLANT
GLOVE BIOGEL PI INDICATOR 6.5 (GLOVE) ×3
GLOVE BIOGEL PI INDICATOR 7.5 (GLOVE) ×2
GLOVE BIOGEL PI INDICATOR 8 (GLOVE) ×1
GLOVE ECLIPSE 7.0 STRL STRAW (GLOVE) ×4 IMPLANT
GLOVE ECLIPSE 7.5 STRL STRAW (GLOVE) ×2 IMPLANT
GLOVE SS BIOGEL STRL SZ 6.5 (GLOVE) ×1 IMPLANT
GLOVE SUPERSENSE BIOGEL SZ 6.5 (GLOVE) ×1
GLOVE SURG SS PI 7.0 STRL IVOR (GLOVE) ×2 IMPLANT
GOWN STRL REUS W/ TWL LRG LVL3 (GOWN DISPOSABLE) ×6 IMPLANT
GOWN STRL REUS W/ TWL XL LVL3 (GOWN DISPOSABLE) ×3 IMPLANT
GOWN STRL REUS W/TWL LRG LVL3 (GOWN DISPOSABLE) ×9
GOWN STRL REUS W/TWL XL LVL3 (GOWN DISPOSABLE) ×9
GRAFT PROPATEN THIN WALL 6X80 (Vascular Products) ×2 IMPLANT
KIT BASIN OR (CUSTOM PROCEDURE TRAY) ×3 IMPLANT
KIT ROOM TURNOVER OR (KITS) ×3 IMPLANT
LIQUID BAND (GAUZE/BANDAGES/DRESSINGS) ×2 IMPLANT
NS IRRIG 1000ML POUR BTL (IV SOLUTION) ×5 IMPLANT
PACK GENERAL/GYN (CUSTOM PROCEDURE TRAY) ×1 IMPLANT
PACK PERIPHERAL VASCULAR (CUSTOM PROCEDURE TRAY) ×2 IMPLANT
PAD ARMBOARD 7.5X6 YLW CONV (MISCELLANEOUS) ×6 IMPLANT
PADDING CAST COTTON 6X4 STRL (CAST SUPPLIES) IMPLANT
SET COLLECT BLD 21X3/4 12 PB (MISCELLANEOUS) ×2 IMPLANT
SPONGE GAUZE 4X4 12PLY STER LF (GAUZE/BANDAGES/DRESSINGS) ×2 IMPLANT
STAPLER VISISTAT 35W (STAPLE) ×3 IMPLANT
STOCKINETTE IMPERVIOUS LG (DRAPES) ×1 IMPLANT
STOPCOCK 4 WAY LG BORE MALE ST (IV SETS) ×2 IMPLANT
SUT ETHILON 3 0 PS 1 (SUTURE) IMPLANT
SUT PROLENE 5 0 C 1 24 (SUTURE) ×2 IMPLANT
SUT PROLENE 6 0 BV (SUTURE) ×4 IMPLANT
SUT PROLENE 6 0 CC (SUTURE) ×2 IMPLANT
SUT PROLENE 7 0 BV 1 (SUTURE) ×6 IMPLANT
SUT SILK 2 0 (SUTURE)
SUT SILK 2 0 FS (SUTURE) ×4 IMPLANT
SUT SILK 2 0 SH CR/8 (SUTURE) ×2 IMPLANT
SUT SILK 2-0 18XBRD TIE 12 (SUTURE) ×1 IMPLANT
SUT VIC AB 2-0 CT1 27 (SUTURE) ×3
SUT VIC AB 2-0 CT1 TAPERPNT 27 (SUTURE) ×3 IMPLANT
SUT VIC AB 2-0 SH 18 (SUTURE) ×1 IMPLANT
SUT VIC AB 3-0 SH 27 (SUTURE) ×6
SUT VIC AB 3-0 SH 27X BRD (SUTURE) ×4 IMPLANT
SUT VIC AB 4-0 PS2 27 (SUTURE) ×4 IMPLANT
SYRINGE 3CC LL L/F (MISCELLANEOUS) ×4 IMPLANT
TRAY FOLEY W/METER SILVER 16FR (SET/KITS/TRAYS/PACK) ×2 IMPLANT
UNDERPAD 30X30 INCONTINENT (UNDERPADS AND DIAPERS) ×3 IMPLANT
WATER STERILE IRR 1000ML POUR (IV SOLUTION) ×3 IMPLANT

## 2015-04-10 SURGICAL SUPPLY — 8 items
CATH OMNI FLUSH 5F 65CM (CATHETERS) ×1 IMPLANT
KIT MICROINTRODUCER STIFF 5F (SHEATH) ×1 IMPLANT
KIT PV (KITS) ×2 IMPLANT
SHEATH PINNACLE 5F 10CM (SHEATH) ×1 IMPLANT
SYR MEDRAD MARK V 150ML (SYRINGE) ×2 IMPLANT
TRANSDUCER W/STOPCOCK (MISCELLANEOUS) ×2 IMPLANT
TRAY PV CATH (CUSTOM PROCEDURE TRAY) ×2 IMPLANT
WIRE HITORQ VERSACORE ST 145CM (WIRE) ×1 IMPLANT

## 2015-04-10 NOTE — Op Note (Signed)
Procedure: Thrombectomy of tibial arteries, Vein patch right popliteal artery, Right femoral to below knee popliteal bypass with Propaten, angiogram  Preoperative diagnosis: Acute ischemia right foot  Postoperative diagnosis: Same  Anesthesia: General  Asst.: Gerri Lins, PA-c  Operative findings:     6 mm Propaten PTFE, 1 vessel runoff PT, DP/PT doppler at end of case  Operative details: After obtaining informed consent, the patient was taken to the operating room. The patient was placed in supine position on the operating room table. After induction of general anesthesia and endotracheal intubation, a Foley catheter was placed. Next, the patient's entire right lower extremity was prepped and draped in the usual sterile fashion.    An incision was made on the medial aspect of the leg below the knee. The saphenous vein was reflected posteriorly and the popliteal space was entered after taking down the fascia..  The muscles of the posterior and posterior deep portions of the leg were fully decompressed through this incision. The popliteal artery was dissected free circumferentially. The anterior tibial vein was ligated and divided between silk ties a portion of the soleus muscle was taken down to expose the tibioperoneal trunk and the anterior tibial arteries.  These were fairly heavily calcified.  Fresh thrombus could be seen in the vessel.  The below knee popliteal artery was dissected free circumferentially and a vessel loop placed around this and the tibial vessels.  The patient was given 8000 units of heparin intravenously.  A longitudinal opening was made in the below knee popliteal artery and thrombus remove from the origin of the tibial vessels under direct vision. A 3 fogarty was then passed down the anterior tibial artery and some additional thrombus was removed but due to the severe calcification of the vessel and underlying plaque the catheter would only pass about 10 cm.  This was  thoroughly flushed with heparinized saline.  It was then reoccluded with a vessel loop.  The 3 fogarty was then passed down the tibial peroneal trunk and I was easily able to pass the full length of the catheter despite arterial calcification.  Some thrombus was retrieved but eventually 2 clean passes were obtained.  There was good backbleeding.  The vessel was thoroughly flushed with heparinized saline and then reoccluded with a loop. I then passed a 4 fogarty up the SFA multiple times and some thrombus was retrieved but the inflow was not really adequate.  Preop angio had shown a high grade SFA stenosis proximally.  I reviewed these images and did not feel the SFA was large enough to get a decent result with a stent.  Therefore, I harvested a few centimeters of greater saphenous from the incision.  This was reversed dilated and sewn on as a patch angioplasty using a running 6 0 prolene suture.  At completion all the usual flushing procedures were performed before the anastomosis was secured.      At this point I did a fasciotomy incision on the lateral aspect of the leg to assess muscle viability and decompress the anterior and lateral compartments.  The anterior compartment was pink and reasonable contractile.  The lateral compartment was pink but poorly contractile.  I felt she had enough viable muscle that the leg may be salvageable.    A longitudinal incision was then made in the right groin and carried down through the subcutaneous tissues to expose the right common femoral artery. The common femoral artery was dissected free circumferentially. There was a pulse within the common  femoral artery. There was also some posterior plaque.  The distal external iliac artery was dissected free circumferentially underneath the inguinal ligament.  A vessel loop was also placed around the distal external iliac artery. The profunda femoris and superficial femoral arteries were also dissected free circumferentially as  well and vessel loops placed around these.    A tunnel was then created between the heads of the gastrocnemius muscle subsartorial up to the groin.  The patient was given and additional 8000 units of heparin.  After appropriate circulation time, the distal right external iliac artery was controlled with a small cooley clamp. The profunda was controlled with a vessel loop as well as the SFA.   A longitudinal opening was made in the common femoral artery on its anterior surface.  The vessel was thickened but soft.  A 6 mm Propaten graft was then brought up on the operative field and spatulated.  The graft was then sewn end of graft to side of artery using a running 5 0 Prolene.  The graft was clamped just after its origin.  Flow was restored to the native SFA and profunda.  The anastomosis was packed with thrombin and gelfoam.  The graft was then brought through the subsartorial tunnel down to the below-knee popliteal artery after marking for orientation. The below-knee popliteal artery was controlled proximally with a vessel loop and distally with loops on the tibial vessels. A longitudinal opening was made in the distal below-knee popliteal artery through the preexisting vein patch. The graft was then cut to length and spatulated and sewn end of graft to side of vein patch using running 6-0 Prolene suture.  At completion of the anastomosis everything was forebled backbled and thoroughly flushed. The remainder of the anastomosis was completed and all clamps were removed restoring pulsatile flow to the below-knee popliteal artery.  The patient had good PT and DP doppler flow.    An arteriogram was performed by placing a 21 g needle in the proximal graft. The arteriogram was obtained with inflow occlusion.  This showed a patent distal anastomosis with 1 vessel runoff primarily the posterior tibial artery.  The needle was removed and hemostasis obtained with a 6 0 prolene.    Each anastomosis was then packed with  thrombin and gelfoam until hemostasis was obtained.  The wounds were then irrigated and the gelfoam removed.  The patient was given 80 mg of protamine.  After hemostasis was obtained, the subcutaneous layers of the below-knee popliteal incision were closed with running 3-0 Vicryl suture leaving the fascia open.  The skin of the below knee popliteal incision was closed with staples.  The groin was inspected and found to be hemostatic. This was then closed in multiple layers of running 2 0 and 3-0 Vicryl suture and 4-0 subcuticular stitch. Dermabond was applied to the groin incision.  The lateral fasciotomy wound was covered with moist 4 x 4 s.  The patient tolerated the procedure well and there were no complications. Instrument sponge and needle counts were correct in the case. Patient was taken to the recovery in stable condition.  Ruta Hinds, MD

## 2015-04-10 NOTE — Progress Notes (Signed)
Site area: left groin fa sheath pulled and pressure held by [facility] Site Prior to Removal:  Level   0 Pressure Applied For:  20 minutes Manual:   yes Patient Status During Pull:  stable Post Pull Site:  Level  0 Post Pull Instructions Given:  yes Post Pull Pulses Present:   yes Dressing Applied:  dressing Bedrest begins @  1300 Comments:  0

## 2015-04-10 NOTE — Progress Notes (Signed)
Beth, CRNA requested that blood glucose be obtained on patient. Nurse did a CBG and it was 59, and patient informed Nurse that she could tell that her sugar was low because her head felt funny. Nurse called Dr. Conrad St. Martin and informed him of this. Dr. Conrad Elmer ordered for patient to have a half a ampule of D50. Will enter orders and administer.

## 2015-04-10 NOTE — Interval H&P Note (Signed)
[facility]  History and Physical Update  The patient was interviewed and re-examined.  The patient's previous History and Physical has been reviewed and is unchanged from Dr. Trula Slade consult except for: improvement in renal function.  There is no change in the plan of care: aortogram, right leg runoff, and possible intervention.  I discussed with the patient the nature of angiographic procedures, especially the limited patencies of any endovascular intervention.  The patient is aware of that the risks of an angiographic procedure include but are not limited to: bleeding, infection, access site complications, renal failure, embolization, rupture of vessel, dissection, possible need for emergent surgical intervention, possible need for surgical procedures to treat the patient's pathology, anaphylactic reaction to contrast, and stroke and death.  The patient is aware of the risks and agrees to proceed.   Adele Barthel, MD [facility] [place] Office: [phone] Pager: [phone]  04/10/2015, 11:26 AM

## 2015-04-10 NOTE — Progress Notes (Signed)
UR COMPLETED  

## 2015-04-10 NOTE — Progress Notes (Signed)
Dr. Jory Sims called to due to drop in blood pressure, pt asymptomatic. New order received and will continue to monitor.    04/10/15 0830  Vitals  BP (!) 74/53 mmHg  MAP (mmHg) 56  Pulse Rate (!) 110  ECG Heart Rate (!) 110  Resp 11  Oxygen Therapy  SpO2 94 %

## 2015-04-10 NOTE — Anesthesia Procedure Notes (Signed)
Procedure Name: Intubation Date/Time: 04/10/2015 2:32 PM Performed by: Scheryl Darter Pre-anesthesia Checklist: Patient identified, Emergency Drugs available, Suction available, Patient being monitored and Timeout performed Patient Re-evaluated:Patient Re-evaluated prior to inductionOxygen Delivery Method: Circle system utilized Preoxygenation: Pre-oxygenation with 100% oxygen Intubation Type: IV induction Ventilation: Mask ventilation without difficulty Laryngoscope Size: Mac and 3 Grade View: Grade I Tube type: Oral Tube size: 7.5 mm Number of attempts: 1 Airway Equipment and Method: Stylet Placement Confirmation: ETT inserted through vocal cords under direct vision,  positive ETCO2 and breath sounds checked- equal and bilateral Secured at: 22 cm Tube secured with: Tape Dental Injury: Teeth and Oropharynx as per pre-operative assessment  Comments: LMA 4 placed/unsatisfactory/removed and intubated with ease

## 2015-04-10 NOTE — Progress Notes (Signed)
Pt transferred to Cath lab and later transferred to OR

## 2015-04-10 NOTE — Progress Notes (Addendum)
[place] for Heparin Indication: Limb ischemia  Allergies  Allergen Reactions  . Penicillins Other (See Comments)    Unknown reaction  . Ciprofloxacin Hives    Patient Measurements: Height: 5\' 6"  (167.6 cm) Weight: 168 lb 3.4 oz (76.3 kg) IBW/kg (Calculated) : 59.3 Heparin Dosing Weight: 75 kg  Vital Signs: Temp: 98.2 F (36.8 C) (07/24 2212) Temp Source: Oral (07/24 2212) BP: 107/87 mmHg (07/24 2020) Pulse Rate: 127 (07/24 2020)  Labs:  Recent Labs  04/09/15 1535 04/09/15 1924 04/10/15 0012  HGB 19.1*  --  17.9*  HCT 54.6*  --  52.4*  PLT 249  --  214  APTT 27  --   --   HEPARINUNFRC  --   --  0.78*  CREATININE 1.26*  --  1.15*  CKTOTAL  --  30940*  --     Estimated Creatinine Clearance: 51.6 mL/min (by C-G formula based on Cr of 1.15).  Assessment: 64 YO female with RLE ischemia, possible DVT, for heparin  Goal of Therapy:  Heparin level 0.3-0.7 units/ml Monitor platelets by anticoagulation protocol: Yes   Plan:  Decrease Heparin 1100 units/hr Follow-up am labs.    Phillis Knack, PharmD, BCPS  04/10/2015 12:44 AM   ADDENDUM:  AM HL came back therapeutic at 0.59 after decrease in rate.  Plan: Continue heparin gtt at 1100 units/hr Check confirmatory HL at 1430 Monitor daily HL, CBC, s/s of bleed  Elenor Quinones, PharmD Clinical Pharmacist Pager [phone] 04/10/2015 10:05 AM

## 2015-04-10 NOTE — H&P (View-Only) (Signed)
Consult Note  Patient name: [name] MRN: [id number] DOB: 12/29/1950 Sex: female  Consulting Physician:  ER   Reason for Consult:  Chief Complaint  Patient presents with  . Foot Pain    HISTORY OF PRESENT ILLNESS: This is a 64 year old female who presented earlier to [facility] with pain in her right foot which began this past Thursday night.  She tried to suffer through this but then had trouble walking earlier today and therefore went to the emergency department.  She has trouble wiggling her toes and has decreased sensation.  The patient has a history of vascular disease, having undergone bilateral carotid endarterectomy by Dr. Kellie Simmering.  She is medically managed for hypertension.  She suffers from diabetes which is poorly controlled.  She is a smoker.  Past Medical History  Diagnosis Date  . Hypertension   . Diabetes mellitus without complication   . COPD (chronic obstructive pulmonary disease)   . Stroke   . Chronic back pain     Past Surgical History  Procedure Laterality Date  . Back fusion    . Cholecystectomy    . Foot surgery Bilateral   . Cardiac catheterization      History   Social History  . Marital Status: Married    Spouse Name: N/A  . Number of Children: N/A  . Years of Education: N/A   Occupational History  . Not on file.   Social History Main Topics  . Smoking status: Current Every Day Smoker -- 1.50 packs/day for 40 years    Types: Cigarettes  . Smokeless tobacco: Never Used  . Alcohol Use: No  . Drug Use: No  . Sexual Activity: Not on file   Other Topics Concern  . Not on file   Social History Narrative    Family History  Problem Relation Age of Onset  . Cancer Father     Allergies as of 04/09/2015 - Review Complete 04/09/2015  Allergen Reaction Noted  . Penicillins Other (See Comments) 07/17/2012  . Ciprofloxacin Hives 04/09/2015    No current facility-administered medications on file prior to encounter.     Current Outpatient Prescriptions on File Prior to Encounter  Medication Sig Dispense Refill  . ALPRAZolam (XANAX) 0.25 MG tablet Take 0.25 mg by mouth 4 (four) times daily as needed for anxiety.     . citalopram (CELEXA) 20 MG tablet Take 20 mg by mouth daily.    Marland Kitchen lisinopril (PRINIVIL,ZESTRIL) 20 MG tablet Take 20 mg by mouth daily.    Marland Kitchen oxyCODONE-acetaminophen (PERCOCET) 5-325 MG per tablet Take 1 tablet by mouth every 8 (eight) hours as needed for severe pain. 30 tablet 0  . pantoprazole (PROTONIX) 40 MG tablet Take 1 tablet (40 mg total) by mouth daily at 12 noon. (Patient taking differently: Take 40 mg by mouth daily. ) 30 tablet 2     REVIEW OF SYSTEMS: Cardiovascular: No chest pain, chest pressure, palpitations, orthopnea, or dyspnea on exertion. No history of DVT or phlebitis.  Positive right foot pain Pulmonary: No productive cough, asthma or wheezing. Neurologic: No weakness, paresthesias, aphasia, or amaurosis. No dizziness. Hematologic: No bleeding problems or clotting disorders. Musculoskeletal: No joint pain or joint swelling. Gastrointestinal: No blood in stool or hematemesis Genitourinary: No dysuria or hematuria. Psychiatric:: No history of major depression. Integumentary: No rashes or ulcers. Constitutional: No fever or chills.  PHYSICAL EXAMINATION: General: The patient appears their stated age.  Vital signs are BP  119/87 mmHg  Pulse 129  Temp(Src) 99.4 F (37.4 C) (Oral)  Resp 16  Ht 5\' 6"  (1.676 m)  Wt 170 lb (77.111 kg)  BMI 27.45 kg/m2  SpO2 94% Pulmonary: Respirations are non-labored HEENT:  No gross abnormalities Abdomen: Soft and non-tender  Musculoskeletal: There are no major deformities.   Neurologic: Decreased sensation of the right leg beginning at the ankle.  Decreased motor function of the right foot, Skin: The skin on the right foot is cool to the touch.  There is no mottling or blistering or bluish discoloration. Psychiatric: The patient has  normal affect. Cardiovascular: Tachycardia.  Palpable femoral pulses bilaterally.  There is a brisk left posterior tibial Doppler signal.  No audible signals are identified in the right foot.  There is a biphasic right popliteal signal  Diagnostic Studies: none    Assessment:  Right foot pain Plan: The patient does have mild swelling and calf tenderness in the right leg, therefore a venous ultrasound will be ordered to rule out DVT.  However, I think that the patient is suffering from right lower extremity ischemia.  This has been going on for at least 72 hours.  On examination no audible Doppler signal is identified in the foot, however there is a biphasic signal in the popliteal artery.  Clinically the foot does not look mottled.  There is actually a pink color, however she has diminished motor and sensory function.  Given the duration of ischemia (see 72 hours) and with a popliteal signal, I suspect that she either has a thromboembolus and her tibial vessels or distal popliteal artery, or she has had progression of chronic disease secondary to her smoking and poorly controlled diabetes.  I discussed with the patient that she is at risk for amputation.  The patient is dehydrated with acute renal failure and tachycardia to the 130s.  Therefore, I would recommend admission to the hospital with IV fluid hydration and IV heparinization with plans for angiography in the morning and revascularization once the studies have been completed.  I do not think that the patient needs to be taken emergently to the operating room currently given the appearance of her foot.  Hopefully she will get some relief tonight with heparin and narcotics     V. Leia Alf, M.D. [facility] [place] Office: [phone] Pager:  [phone]

## 2015-04-10 NOTE — Progress Notes (Signed)
Patient Demographics:    [name], is a 64 y.o. female, DOB - 1951-07-15, XKG:[id number]  Admit date - 04/09/2015   Admitting Physician Costin Karlyne Greenspan, MD  Outpatient Primary MD for the patient is Maricela Curet, MD  LOS - 1   Chief Complaint  Patient presents with  . Foot Pain        Subjective:    [name] today has, No headache, No chest pain, No abdominal pain - No Nausea, No new weakness tingling or numbness, No Cough - SOB. Is having intense constant right lower extremity nonradiating pain no aggravating or relieving factors.   Assessment  & Plan :    1. 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 On heparin drip, pain control, IV fluids, vascular surgery on board due for arteriogram along with intervention as needed later today.  2. COPD. At baseline no acute issues, no wheezing on exam, supportive care with oxygen of [facility] treatment as needed.   3. Essential hypertension on beta blocker continue as tolerated by blood pressure, IV fluid bolus If hypotensive.   4. Smoking. Counseled to quit   5. Depression. Continue Celexa.   6. GERD. On PPI continue.   7. Hyponatremia. Gentle hydration, obtain urine osmolality, urine electrolytes and serum osmolar 2. Monitor BMP.     Code Status : Full  Family Communication  : None present  Disposition Plan  : To be decided  Consults  :  Vascular surgeon Dr. Trula Slade  Procedures  :    Right leg venous duplex negative ultrasound  Right leg arteriogram per vascular surgery  DVT Prophylaxis  :   Heparin    Lab Results  Component Value Date   PLT 214 04/10/2015    Inpatient Medications  Scheduled Meds: . cephALEXin  500 mg Oral TID AC & HS  . citalopram  20 mg Oral Daily  . insulin aspart  0-9  Units Subcutaneous Q4H  . insulin glargine  25 Units Subcutaneous QHS  . metoprolol tartrate  50 mg Oral BID  . pantoprazole  40 mg Oral Daily   Continuous Infusions: . sodium chloride 75 mL/hr at 04/10/15 0745  . heparin 1,100 Units/hr (04/10/15 0900)   PRN Meds:.ALPRAZolam, HYDROmorphone (DILAUDID) injection, levalbuterol, metoprolol, oxyCODONE-acetaminophen  Antibiotics  :    Anti-infectives    Start     Dose/Rate Route Frequency Ordered Stop   04/10/15 0030  cephALEXin (KEFLEX) capsule 500 mg  Status:  Discontinued     500 mg Oral 3 times daily before meals & bedtime 04/09/15 2345 04/09/15 2347   04/10/15 0030  cephALEXin (KEFLEX) capsule 500 mg     500 mg Oral 3 times daily before meals & bedtime 04/09/15 2347 04/13/15 2359        Objective:   Filed Vitals:   04/10/15 0815 04/10/15 0830 04/10/15 0900 04/10/15 0915  BP: 89/58 74/53 78/55  88/67  Pulse: 106 110 106 108  Temp:      TempSrc:      Resp: 13 11 12 14   Height:      Weight:      SpO2: 91% 94% 96% 97%    Wt Readings from Last 3 Encounters:  04/09/15 76.3 kg (168 lb 3.4 oz)  12/26/13 66.67 kg (146 lb 15.7 oz)  08/05/13 73.029 kg (161 lb)     Intake/Output Summary (Last 24 hours) at 04/10/15 1115 Last data filed at 04/10/15 0900  Gross per 24 hour  Intake   1033 ml  Output    700 ml  Net    333 ml     Physical Exam  Awake Alert, Oriented X 3, No new F.N deficits, Normal affect [place].AT,PERRAL Supple Neck,No JVD, No cervical lymphadenopathy appriciated.  Symmetrical Chest wall movement, Good air movement bilaterally, CTAB RRR,No Gallops,Rubs or new Murmurs, No Parasternal Heave +ve B.Sounds, Abd Soft, No tenderness, No organomegaly appriciated, No rebound - guarding or rigidity. No Cyanosis, Clubbing or edema, No new Rash or bruise, right leg cold to touch, poor peripheral pulsations    Data Review:   Micro Results Recent Results (from the past 240 hour(s))  MRSA PCR Screening     Status: None     Collection Time: 04/09/15  8:29 PM  Result Value Ref Range Status   MRSA by PCR NEGATIVE NEGATIVE Final    Comment:        The GeneXpert MRSA Assay (FDA approved for NASAL specimens only), is one component of a comprehensive MRSA colonization surveillance program. It is not intended to diagnose MRSA infection nor to guide or monitor treatment for MRSA infections.     Radiology Reports Dg Chest Port 1 View  04/09/2015   CLINICAL DATA:  Right calf red and swollen, severe pain  EXAM: PORTABLE CHEST - 1 VIEW  COMPARISON:  08/10/2010  FINDINGS: The heart size and mediastinal contours are within normal limits. Both lungs are clear. The visualized skeletal structures are unremarkable.  IMPRESSION: No active disease.   Electronically Signed   By: Skipper Cliche M.D.   On: 04/09/2015 17:16     CBC  Recent Labs Lab 04/09/15 1535 04/10/15 0012  WBC 14.4* 10.6*  HGB 19.1* 17.9*  HCT 54.6* 52.4*  PLT 249 214  MCV 93.3 94.2  MCH 32.6 32.2  MCHC 35.0 34.2  RDW 13.5 13.7  LYMPHSABS 3.0  --   MONOABS 1.4*  --   EOSABS 0.0  --   BASOSABS 0.0  --     Chemistries   Recent Labs Lab 04/09/15 1535 04/10/15 0012  NA 131* 126*  K 5.5* 4.6  CL 93* 93*  CO2 25 26  GLUCOSE 132* 172*  BUN 25* 20  CREATININE 1.26* 1.15*  CALCIUM 10.0 8.4*   ------------------------------------------------------------------------------------------------------------------ estimated creatinine clearance is 51.6 mL/min (by C-G formula based on Cr of 1.15). ------------------------------------------------------------------------------------------------------------------ No results for input(s): HGBA1C in the last 72 hours. ------------------------------------------------------------------------------------------------------------------ No results for input(s): CHOL, HDL, LDLCALC, TRIG, CHOLHDL, LDLDIRECT in the last 72  hours. ------------------------------------------------------------------------------------------------------------------ No results for input(s): TSH, T4TOTAL, T3FREE, THYROIDAB in the last 72 hours.  Invalid input(s): FREET3 ------------------------------------------------------------------------------------------------------------------ No results for input(s): VITAMINB12, FOLATE, FERRITIN, TIBC, IRON, RETICCTPCT in the last 72 hours.  Coagulation profile No results for input(s): INR, PROTIME in the last 168 hours.  No results for input(s): DDIMER in the last 72 hours.  Cardiac Enzymes No results for input(s): CKMB, TROPONINI, MYOGLOBIN in the last 168 hours.  Invalid input(s): CK ------------------------------------------------------------------------------------------------------------------ Invalid input(s): POCBNP   Time Spent in minutes   30   Siddhi Dornbush K M.D on 04/10/2015 at 11:15 AM  Between 7am to 7pm - Pager - [phone]  After 7pm go to [email or web address] - password [facility]  [facility] -  Office  [phone]

## 2015-04-10 NOTE — Anesthesia Preprocedure Evaluation (Signed)
Anesthesia Evaluation  Patient identified by MRN, date of birth, ID band Patient awake    Reviewed: Allergy & Precautions, NPO status , Patient's Chart, lab work & pertinent test results  Airway Mallampati: I  TM Distance: >3 FB Neck ROM: Full    Dental   Pulmonary COPDCurrent Smoker,    Pulmonary exam normal       Cardiovascular hypertension, Pt. on medications Normal cardiovascular exam    Neuro/Psych    GI/Hepatic   Endo/Other  diabetes, Type 2, Insulin Dependent  Renal/GU      Musculoskeletal   Abdominal   Peds  Hematology   Anesthesia Other Findings   Reproductive/Obstetrics                             Anesthesia Physical Anesthesia Plan  ASA: III  Anesthesia Plan: General   Post-op Pain Management:    Induction: Intravenous  Airway Management Planned: LMA  Additional Equipment:   Intra-op Plan:   Post-operative Plan: Extubation in OR  Informed Consent: I have reviewed the patients History and Physical, chart, labs and discussed the procedure including the risks, benefits and alternatives for the proposed anesthesia with the patient or authorized representative who has indicated his/her understanding and acceptance.     Plan Discussed with: CRNA and Surgeon  Anesthesia Plan Comments:         Anesthesia Quick Evaluation

## 2015-04-10 NOTE — Interval H&P Note (Signed)
History and Physical Interval Note:  04/10/2015 1:54 PM  [name]  has presented today for surgery, with the diagnosis of Right foot pain M79.671  The various methods of treatment have been discussed with the patient and family. After consideration of risks, benefits and other options for treatment, the patient has consented to  Procedure(s): THROMBECTOMY FEMORAL ARTERY (Right) AMPUTATION BELOW KNEE VERSUS ABOVE KNEE (Right) as a surgical intervention .  The patient's history has been reviewed, patient examined, no change in status, stable for surgery.  I have reviewed the patient's chart and labs.  Questions were answered to the patient's satisfaction.    Lengthy discussion with patient and her family.  Pt has severe pain even to light touch in the anterior and posterior compartments.  She is unable to move the right toes or ankle.  I reviewed her arteriogram which shows SFA occlusive disease as well a totally occluded popliteal artery with no runoff.   The patient has no history of afib or cardiac arrhythmia so most likely this represents acute worsening of chronic occlusive disease rather than embolic disease.  I spoke with the patient about attempting a popliteal embolectomy.  However if not successful at achieving adequate outflow or inflow I will proceed with right above knee amputation and she is agreeable to this.  Risks of limb loss bleeding infection myocardial events discussed.   Ruta Hinds

## 2015-04-10 NOTE — Progress Notes (Signed)
    Subjective  - HD #2  Foot felt better overnight, but has started to  Hurt again this am   Physical Exam:  Right foot remains pink and cold No palpable pedal pulses Calf is less tender       Assessment/Plan:  Ischemic right leg  Appreciate Hosptalist service assistance with medical issues I have reviewed her ultrasound studies from yesterday which were negative for DVT, and positive for arterial insufficiency Plan is to proceed with angiogram today, an based on those results, proceed with attempt at revascularization  Acute kidney injury improved with IV hydration Pt needs fasting lipid panel and addition of a statin Hyponatremia: continue to monitor CK very elevated..?ischemic muscle.  At operation, if muscle is not viable, would need AKA vs BKA.  May be contributing to renal issues  Annamarie Major 04/10/2015 8:13 AM --  Danley Danker Vitals:   04/10/15 0450  BP:   Pulse:   Temp: 98.5 F (36.9 C)  Resp:     Intake/Output Summary (Last 24 hours) at 04/10/15 0813 Last data filed at 04/10/15 0998  Gross per 24 hour  Intake   1000 ml  Output    700 ml  Net    300 ml     Laboratory CBC    Component Value Date/Time   WBC 10.6* 04/10/2015 0012   HGB 17.9* 04/10/2015 0012   HCT 52.4* 04/10/2015 0012   PLT 214 04/10/2015 0012    BMET    Component Value Date/Time   NA 126* 04/10/2015 0012   K 4.6 04/10/2015 0012   CL 93* 04/10/2015 0012   CO2 26 04/10/2015 0012   GLUCOSE 172* 04/10/2015 0012   BUN 20 04/10/2015 0012   CREATININE 1.15* 04/10/2015 0012   CALCIUM 8.4* 04/10/2015 0012   GFRNONAA 49* 04/10/2015 0012   GFRAA 57* 04/10/2015 0012    COAG Lab Results  Component Value Date   INR 0.9 11/09/2008   INR 1.0 04/12/2008   INR 0.9 04/11/2008   No results found for: PTT  Antibiotics Anti-infectives    Start     Dose/Rate Route Frequency Ordered Stop   04/10/15 0030  cephALEXin (KEFLEX) capsule 500 mg  Status:  Discontinued     500 mg Oral 3  times daily before meals & bedtime 04/09/15 2345 04/09/15 2347   04/10/15 0030  cephALEXin (KEFLEX) capsule 500 mg     500 mg Oral 3 times daily before meals & bedtime 04/09/15 2347 04/13/15 2359       V. Leia Alf, M.D. [facility] [place] Office: [phone] Pager:  [phone]

## 2015-04-10 NOTE — Anesthesia Postprocedure Evaluation (Signed)
  Anesthesia Post-op Note  Patient: [name]  Procedure(s) Performed: Procedure(s): THROMBECTOMY RIGHT TIBIAL  ARTERY WITH VEIN PATCH ANGIOPLASTY (Right) FOUR COMPARTMENT FASCIOTOMY (Right) RIGHT  FEMORAL-BELOW THE KNEE POPLITEAL ARTERY BYPASS GRAFT USING 6 MM X 80 CM PROPATEN GRAFT (Right) INTRA OPERATIVE ARTERIOGRAM (Right)  Patient Location: PACU  Anesthesia Type:General  Level of Consciousness: awake and alert   Airway and Oxygen Therapy: Patient Spontanous Breathing  Post-op Pain: Controlled  Post-op Assessment: Post-op Vital signs reviewed, Patient's Cardiovascular Status Stable and Respiratory Function Stable  Post-op Vital Signs: Reviewed  Filed Vitals:   04/10/15 1915  BP: 125/91  Pulse: 86  Temp:   Resp: 8    Complications: No apparent anesthesia complications

## 2015-04-10 NOTE — Transfer of Care (Signed)
Immediate Anesthesia Transfer of Care Note  Patient: [name]  Procedure(s) Performed: Procedure(s): THROMBECTOMY RIGHT TIBIAL  ARTERY WITH VEIN PATCH ANGIOPLASTY (Right) FOUR COMPARTMENT FASCIOTOMY (Right) RIGHT  FEMORAL-BELOW THE KNEE POPLITEAL ARTERY BYPASS GRAFT USING 6 MM X 80 CM PROPATEN GRAFT (Right) INTRA OPERATIVE ARTERIOGRAM (Right)  Patient Location: PACU  Anesthesia Type:General  Level of Consciousness: awake, alert  and oriented  Airway & Oxygen Therapy: Patient Spontanous Breathing and Patient connected to nasal cannula oxygen  Post-op Assessment: Report given to RN and Post -op Vital signs reviewed and stable  Post vital signs: Reviewed and stable  Last Vitals:  Filed Vitals:   04/10/15 1300  BP: 118/66  Pulse: 111  Temp:   Resp: 13    Complications: No apparent anesthesia complications

## 2015-04-10 NOTE — H&P (View-Only) (Signed)
    Subjective  - HD #2  Foot felt better overnight, but has started to  Hurt again this am   Physical Exam:  Right foot remains pink and cold No palpable pedal pulses Calf is less tender       Assessment/Plan:  Ischemic right leg  Appreciate Hosptalist service assistance with medical issues I have reviewed her ultrasound studies from yesterday which were negative for DVT, and positive for arterial insufficiency Plan is to proceed with angiogram today, an based on those results, proceed with attempt at revascularization  Acute kidney injury improved with IV hydration Pt needs fasting lipid panel and addition of a statin Hyponatremia: continue to monitor CK very elevated..?ischemic muscle.  At operation, if muscle is not viable, would need AKA vs BKA.  May be contributing to renal issues  Annamarie Major 04/10/2015 8:13 AM --  Danley Danker Vitals:   04/10/15 0450  BP:   Pulse:   Temp: 98.5 F (36.9 C)  Resp:     Intake/Output Summary (Last 24 hours) at 04/10/15 0813 Last data filed at 04/10/15 6384  Gross per 24 hour  Intake   1000 ml  Output    700 ml  Net    300 ml     Laboratory CBC    Component Value Date/Time   WBC 10.6* 04/10/2015 0012   HGB 17.9* 04/10/2015 0012   HCT 52.4* 04/10/2015 0012   PLT 214 04/10/2015 0012    BMET    Component Value Date/Time   NA 126* 04/10/2015 0012   K 4.6 04/10/2015 0012   CL 93* 04/10/2015 0012   CO2 26 04/10/2015 0012   GLUCOSE 172* 04/10/2015 0012   BUN 20 04/10/2015 0012   CREATININE 1.15* 04/10/2015 0012   CALCIUM 8.4* 04/10/2015 0012   GFRNONAA 49* 04/10/2015 0012   GFRAA 57* 04/10/2015 0012    COAG Lab Results  Component Value Date   INR 0.9 11/09/2008   INR 1.0 04/12/2008   INR 0.9 04/11/2008   No results found for: PTT  Antibiotics Anti-infectives    Start     Dose/Rate Route Frequency Ordered Stop   04/10/15 0030  cephALEXin (KEFLEX) capsule 500 mg  Status:  Discontinued     500 mg Oral 3  times daily before meals & bedtime 04/09/15 2345 04/09/15 2347   04/10/15 0030  cephALEXin (KEFLEX) capsule 500 mg     500 mg Oral 3 times daily before meals & bedtime 04/09/15 2347 04/13/15 2359       V. Leia Alf, M.D. [facility] [place] Office: [phone] Pager:  [phone]

## 2015-04-11 ENCOUNTER — Encounter (HOSPITAL_COMMUNITY): Payer: Self-pay | Admitting: Vascular Surgery

## 2015-04-11 ENCOUNTER — Ambulatory Visit (HOSPITAL_COMMUNITY): Payer: Medicare Other

## 2015-04-11 DIAGNOSIS — E08311 Diabetes mellitus due to underlying condition with unspecified diabetic retinopathy with macular edema: Secondary | ICD-10-CM

## 2015-04-11 DIAGNOSIS — R000 Tachycardia, unspecified: Secondary | ICD-10-CM

## 2015-04-11 DIAGNOSIS — Z9889 Other specified postprocedural states: Secondary | ICD-10-CM

## 2015-04-11 DIAGNOSIS — J418 Mixed simple and mucopurulent chronic bronchitis: Secondary | ICD-10-CM

## 2015-04-11 LAB — BASIC METABOLIC PANEL
ANION GAP: 5 (ref 5–15)
BUN: 9 mg/dL (ref 6–20)
CALCIUM: 8.2 mg/dL — AB (ref 8.9–10.3)
CO2: 28 mmol/L (ref 22–32)
Chloride: 98 mmol/L — ABNORMAL LOW (ref 101–111)
Creatinine, Ser: 0.89 mg/dL (ref 0.44–1.00)
GFR calc Af Amer: >60 mL/min (ref >60)
Glucose, Bld: 117 mg/dL — ABNORMAL HIGH (ref 65–99)
Potassium: 5 mmol/L (ref 3.5–5.1)
SODIUM: 131 mmol/L — AB (ref 135–145)

## 2015-04-11 LAB — POCT ACTIVATED CLOTTING TIME: Activated Clotting Time: 128 seconds

## 2015-04-11 LAB — LACTIC ACID, PLASMA
Lactic Acid, Venous: 2.6 mmol/L (ref 0.5–2.0)
Lactic Acid, Venous: 1.9 mmol/L (ref 0.5–2.0)

## 2015-04-11 LAB — CBC
HCT: 41.1 % (ref 36.0–46.0)
Hemoglobin: 13.4 g/dL (ref 12.0–15.0)
MCH: 31.5 pg (ref 26.0–34.0)
MCHC: 32.6 g/dL (ref 30.0–36.0)
MCV: 96.5 fL (ref 78.0–100.0)
PLATELETS: 160 10*3/uL (ref 150–400)
RBC: 4.26 MIL/uL (ref 3.87–5.11)
RDW: 13.6 % (ref 11.5–15.5)
WBC: 14.3 10*3/uL — ABNORMAL HIGH (ref 4.0–10.5)

## 2015-04-11 LAB — GLUCOSE, CAPILLARY
GLUCOSE-CAPILLARY: 111 mg/dL — AB (ref 65–99)
Glucose-Capillary: 144 mg/dL — ABNORMAL HIGH (ref 65–99)
Glucose-Capillary: 155 mg/dL — ABNORMAL HIGH (ref 65–99)
Glucose-Capillary: 103 mg/dL — ABNORMAL HIGH (ref 65–99)

## 2015-04-11 LAB — CK: Total CK: 13544 U/L — ABNORMAL HIGH (ref 38–234)

## 2015-04-11 LAB — HEMOGLOBIN A1C
HEMOGLOBIN A1C: 8.2 % — AB (ref 4.8–5.6)
MEAN PLASMA GLUCOSE: 189 mg/dL

## 2015-04-11 LAB — OSMOLALITY, URINE: Osmolality, Ur: 632 mosm/kg (ref 390–1090)

## 2015-04-11 MED ORDER — SODIUM CHLORIDE 0.9 % IV SOLN: INTRAVENOUS | Status: DC

## 2015-04-11 MED ORDER — SODIUM CHLORIDE 0.9 % IV BOLUS (SEPSIS)
250.0000 mL | Freq: Once | INTRAVENOUS | Status: AC
  Administered 2015-04-11: 250 mL via INTRAVENOUS

## 2015-04-11 MED ORDER — SODIUM CHLORIDE 0.9 % IV SOLN
INTRAVENOUS | Status: DC
  Administered 2015-04-12 – 2015-04-13 (×4): via INTRAVENOUS

## 2015-04-11 MED ORDER — ATORVASTATIN CALCIUM 20 MG PO TABS
20.0000 mg | ORAL_TABLET | Freq: Every day | ORAL | Status: DC
  Administered 2015-04-11 – 2015-04-14 (×4): 20 mg via ORAL
  Filled 2015-04-11 (×5): qty 1

## 2015-04-11 MED ORDER — ENOXAPARIN SODIUM 40 MG/0.4ML ~~LOC~~ SOLN
40.0000 mg | SUBCUTANEOUS | Status: DC
  Administered 2015-04-12 – 2015-04-15 (×4): 40 mg via SUBCUTANEOUS
  Filled 2015-04-11 (×5): qty 0.4

## 2015-04-11 MED ORDER — METOPROLOL TARTRATE 12.5 MG HALF TABLET
12.5000 mg | ORAL_TABLET | Freq: Two times a day (BID) | ORAL | Status: DC
  Administered 2015-04-12 – 2015-04-15 (×6): 12.5 mg via ORAL
  Filled 2015-04-11 (×9): qty 1

## 2015-04-11 MED ORDER — SODIUM CHLORIDE 0.9 % IV BOLUS (SEPSIS)
500.0000 mL | Freq: Once | INTRAVENOUS | Status: AC
  Administered 2015-04-11: 500 mL via INTRAVENOUS

## 2015-04-11 NOTE — Progress Notes (Signed)
PT Cancellation Note  Patient Details Name: [name] MRN: [id number] DOB: [birth year]   Cancelled Treatment:    Reason Eval/Treat Not Completed: Patient not medically ready.  Defer this am due to medical issues including low BP. 04/11/2015  Donnella Sham, PT [phone]  (pager)   [name], [name] 04/11/2015, 11:33 AM

## 2015-04-11 NOTE — Care Management Important Message (Signed)
Important Message  Patient Details  Name: [name] MRN: [id number] Date of Birth: 14-Aug-1951   Medicare Important Message Given:  [place] notification given    Nathen May 04/11/2015, 1:59 PMImportant Message  Patient Details  Name: [name] MRN: [id number] Date of Birth: Aug 07, 1951   Medicare Important Message Given:  Yes-second notification given    Nathen May 04/11/2015, 1:59 PM

## 2015-04-11 NOTE — Progress Notes (Signed)
OT Cancellation Note  Patient Details Name: [name] MRN: [id number] DOB: 30-Jan-1951   Cancelled Treatment:    Reason Eval/Treat Not Completed: Medical issues which prohibited therapy. RN states not this AM, but maybe this PM.  Almon Register [phone] 04/11/2015, 9:45 AM

## 2015-04-11 NOTE — Care Management Note (Signed)
Case Management Note  Patient Details  Name: DOREEN GARRETSON MRN: [id number] Date of Birth: 10-12-1950  Subjective/Objective:                 Pt from home with husband, independent with ADL's prior to admission. Admitted with R ischemic foot, s/p Thrombectomy of tibial arteries, Vein patch right popliteal artery, Right femoral to below knee popliteal bypass, wound vac to R calf area.  Action/Plan: Return to home when medically stable. CM to f/u with d/c disposition.  Expected Discharge Date:                  Expected Discharge Plan:  [place]  In-House Referral:     Discharge planning Services     Post Acute Care Choice:  Home Health Choice offered to:  Patient  DME Arranged:  Negative pressure wound device DME Agency:  [place]:    [place] Agency:     Status of Service:  In process, will continue to follow  Medicare Important Message Given:    Date Medicare IM Given:    Medicare IM give by:    Date Additional Medicare IM Given:    Additional Medicare Important Message give by:     If discussed at [place] of Stay Meetings, dates discussed:    AdditionaCecil Bahena (Spouse) [phone] , Margaretha Sheffield Stone(Sister)  [phone] l Comments:  Sharin Mons, RN 04/11/2015, 1:58 PM

## 2015-04-11 NOTE — Progress Notes (Signed)
Patient Demographics:    [name], is a 64 y.o. female, DOB - 05/05/1951, QVZ:[id number]  Admit date - 04/09/2015   Admitting Physician Costin Karlyne Greenspan, MD  Outpatient Primary MD for the patient is Maricela Curet, MD  LOS - 2   Chief Complaint  Patient presents with  . Foot Pain        Subjective:    [name] today has, No headache, No chest pain, No abdominal pain - No Nausea, No new weakness tingling or numbness, No Cough - SOB. Is having intense constant right lower extremity nonradiating pain no aggravating or relieving factors.   Assessment  & Plan :    1. 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 Ask neurosurgery on board, status post Thrombectomy of tibial arteries, Vein patch right popliteal artery, Right femoral to below knee popliteal bypass with Propaten, angiogram by Dr. Trula Slade on 04/10/2015, right leg appears more warm but poor sensation in toe remains. Further management per vascular surgery for this problem. We'll place her on statin on aspirin already.   2. COPD. At baseline no acute issues, no wheezing on exam, supportive care with oxygen & Neb treatments as needed.   3. Essential hypertension - blood pressure soft cut back beta blocker continue gentle hydration.   4. Smoking. Counseled to quit   5. Depression. Continue Celexa.   6. GERD. On PPI continue.   7. Hyponatremia. Gentle hydration, obtain urine osmolality, urine electrolytes and serum osmolar 2. Monitor BMP.     Code Status : Full  Family Communication  : None present  Disposition Plan  : To be decided  Consults  :  Vascular surgeon Dr. Trula Slade  Procedures  :    Right leg venous duplex negative ultrasound  Right leg arteriogram per vascular surgery  Right  -Thrombectomy of tibial arteries, Vein patch right popliteal artery, Right femoral to below knee popliteal bypass with Propaten, angiogram - 04-10-15  DVT Prophylaxis  :   Lovenox   Lab Results  Component Value Date   PLT 160 04/11/2015    Inpatient Medications  Scheduled Meds: . aspirin  325 mg Oral Daily  . cephALEXin  500 mg Oral TID AC & HS  . citalopram  20 mg Oral Daily  . docusate sodium  100 mg Oral Daily  . enoxaparin (LOVENOX) injection  30 mg Subcutaneous Q24H  . insulin aspart  0-9 Units Subcutaneous TID WC  . insulin glargine  25 Units Subcutaneous QHS  . metoprolol tartrate  50 mg Oral BID  . pantoprazole  40 mg Oral Daily  . vancomycin  1,000 mg Intravenous Q12H   Continuous Infusions: . sodium chloride 125 mL/hr at 04/11/15 0800  . sodium chloride     PRN Meds:.acetaminophen, ALPRAZolam, alum & mag hydroxide-simeth, bisacodyl, guaiFENesin-dextromethorphan, hydrALAZINE, HYDROmorphone (DILAUDID) injection, levalbuterol, magnesium sulfate 1 - 4 g bolus IVPB, metoprolol, morphine injection, ondansetron (ZOFRAN) IV, oxyCODONE-acetaminophen, phenol, potassium chloride, senna-docusate  Antibiotics  :    Anti-infectives    Start     Dose/Rate Route Frequency Ordered Stop   04/10/15 2200  vancomycin (VANCOCIN) IVPB 1000 mg/200 mL premix     1,000 mg 200 mL/hr over 60 Minutes Intravenous Every 12 hours 04/10/15 2020 04/11/15 2159  04/10/15 0030  cephALEXin (KEFLEX) capsule 500 mg  Status:  Discontinued     500 mg Oral 3 times daily before meals & bedtime 04/09/15 2345 04/09/15 2347   04/10/15 0030  cephALEXin (KEFLEX) capsule 500 mg     500 mg Oral 3 times daily before meals & bedtime 04/09/15 2347 04/13/15 2359        Objective:   Filed Vitals:   04/11/15 0655 04/11/15 0720 04/11/15 0729 04/11/15 0900  BP:   85/71 81/55  Pulse: 114   80  Temp:  97.8 F (36.6 C)    TempSrc:      Resp: 8  18 15   Height:      Weight:      SpO2: 96%  92% 96%    Wt Readings  from Last 3 Encounters:  04/09/15 76.3 kg (168 lb 3.4 oz)  12/26/13 66.67 kg (146 lb 15.7 oz)  08/05/13 73.029 kg (161 lb)     Intake/Output Summary (Last 24 hours) at 04/11/15 1015 Last data filed at 04/11/15 1005  Gross per 24 hour  Intake 4119.58 ml  Output   2165 ml  Net 1954.58 ml     Physical Exam  Awake Alert, Oriented X 3, No new F.N deficits, Normal affect [place].AT,PERRAL Supple Neck,No JVD, No cervical lymphadenopathy appriciated.  Symmetrical Chest wall movement, Good air movement bilaterally, CTAB RRR,No Gallops,Rubs or new Murmurs, No Parasternal Heave +ve B.Sounds, Abd Soft, No tenderness, No organomegaly appriciated, No rebound - guarding or rigidity. No Cyanosis, Clubbing or edema, No new Rash or bruise, right leg warm to touch, for sensation in toes    Data Review:   Micro Results Recent Results (from the past 240 hour(s))  MRSA PCR Screening     Status: None   Collection Time: 04/09/15  8:29 PM  Result Value Ref Range Status   MRSA by PCR NEGATIVE NEGATIVE Final    Comment:        The GeneXpert MRSA Assay (FDA approved for NASAL specimens only), is one component of a comprehensive MRSA colonization surveillance program. It is not intended to diagnose MRSA infection nor to guide or monitor treatment for MRSA infections.     Radiology Reports Dg Chest Port 1 View  04/09/2015   CLINICAL DATA:  Right calf red and swollen, severe pain  EXAM: PORTABLE CHEST - 1 VIEW  COMPARISON:  08/10/2010  FINDINGS: The heart size and mediastinal contours are within normal limits. Both lungs are clear. The visualized skeletal structures are unremarkable.  IMPRESSION: No active disease.   Electronically Signed   By: Skipper Cliche M.D.   On: 04/09/2015 17:16     CBC  Recent Labs Lab 04/09/15 1535 04/10/15 0012 04/11/15 0250  WBC 14.4* 10.6* 14.3*  HGB 19.1* 17.9* 13.4  HCT 54.6* 52.4* 41.1  PLT 249 214 160  MCV 93.3 94.2 96.5  MCH 32.6 32.2 31.5  MCHC 35.0  34.2 32.6  RDW 13.5 13.7 13.6  LYMPHSABS 3.0  --   --   MONOABS 1.4*  --   --   EOSABS 0.0  --   --   BASOSABS 0.0  --   --     Chemistries   Recent Labs Lab 04/09/15 1535 04/10/15 0012 04/11/15 0250  NA 131* 126* 131*  K 5.5* 4.6 5.0  CL 93* 93* 98*  CO2 25 26 28   GLUCOSE 132* 172* 117*  BUN 25* 20 9  CREATININE 1.26* 1.15* 0.89  CALCIUM 10.0 8.4* 8.2*   ------------------------------------------------------------------------------------------------------------------  estimated creatinine clearance is 66.6 mL/min (by C-G formula based on Cr of 0.89). ------------------------------------------------------------------------------------------------------------------  Recent Labs  04/09/15 1924  HGBA1C 8.2*   ------------------------------------------------------------------------------------------------------------------ No results for input(s): CHOL, HDL, LDLCALC, TRIG, CHOLHDL, LDLDIRECT in the last 72 hours. ------------------------------------------------------------------------------------------------------------------ No results for input(s): TSH, T4TOTAL, T3FREE, THYROIDAB in the last 72 hours.  Invalid input(s): FREET3 ------------------------------------------------------------------------------------------------------------------ No results for input(s): VITAMINB12, FOLATE, FERRITIN, TIBC, IRON, RETICCTPCT in the last 72 hours.  Coagulation profile No results for input(s): INR, PROTIME in the last 168 hours.  No results for input(s): DDIMER in the last 72 hours.  Cardiac Enzymes No results for input(s): CKMB, TROPONINI, MYOGLOBIN in the last 168 hours.  Invalid input(s): CK ------------------------------------------------------------------------------------------------------------------ Invalid input(s): POCBNP   Results for [name], [name] (MRN [id number]) as of 04/11/2015 10:15   Ref. Range 04/09/2015 19:24  CK Total Latest Ref Range: 38-234 U/L 10787 (H)     Time Spent in minutes   30   Jewelz Ricklefs K M.D on 04/11/2015 at 10:15 AM  Between 7am to 7pm - Pager - [phone]  After 7pm go to [email or web address] - password [facility]  [facility] -  Office  [phone]

## 2015-04-11 NOTE — Progress Notes (Signed)
Dilaudid 0.5 mg IV wasted and witness per Darcella Gasman, RN

## 2015-04-11 NOTE — Progress Notes (Signed)
CM spoke with pt  regarding possibly wound vac @ d/c for [facility] services. Choice given to pt and pt agreed with [place]. Referral made tentatively with Butch Penny and Brenton Grills ([facility]). MD will need to order wound vac for [facility] services , [facility], and  face to face completed @ discharge. CM will leave Negative Pressure Wound Therapy Order Form in shadow chart for MD to complete, also. CM to f/u with disposition needs. Whitman Hero RN, [place] [phone]

## 2015-04-11 NOTE — Progress Notes (Addendum)
[facility] [place]  Subjective  - doing ok over all, right foot feels better.   Objective 101/58 120 98.8 F (37.1 C) (Oral) 14 90%  Intake/Output Summary (Last 24 hours) at 04/11/15 0724 Last data filed at 04/11/15 0511  Gross per 24 hour  Intake 3485.75 ml  Output   1965 ml  Net 1520.75 ml    Doppler PT, DP sounds venous Right foot warm with minimal active range of motion of the toes.  Dorsiflexion and plantar flexion are not intact. Heart RRR tachycardia 102's ( given lopressor) Lungs non labored breathing  Assessment/Planning: POD #1 Thrombectomy of tibial arteries, Vein patch right popliteal artery, Right femoral to below knee popliteal bypass with Propaten, angiogram  Heart periods of tachycardia given lopressor, BP 85/71 will give 500 ml bolus followed by maintained IV NS @ 125 cc/hr to support hypotension. Lungs RA Dressing clean and dry on right calf plan yo change with Dr. Oneida Alar later today to visually inspect the lateral muscle for viability.  Possible wound vac placement. DVT prophylaxis Lovenox SQ 325 mg Aspirin antiplatelet WBC elevated 14.3 from 10.6 pre-op ischemic muscle   [name], [name] 04/11/2015 7:24 AM -- Agree with above.  Muscle so far looks viable but has persistant foot drop no movement of ankle, toes moving some. Will place VAC on fasciotomy today Supportive care of BP OOB.  Will need to consider Rehab depending on how things heal over the next few days  Ruta Hinds, MD [facility] [place]: [phone] Pager: [phone]  Laboratory Lab Results:  Recent Labs  04/10/15 0012 04/11/15 0250  WBC 10.6* 14.3*  HGB 17.9* 13.4  HCT 52.4* 41.1  PLT 214 160   BMET  Recent Labs  04/10/15 0012 04/11/15 0250  NA 126* 131*  K 4.6 5.0  CL 93* 98*  CO2 26 28  GLUCOSE 172* 117*  BUN 20 9  CREATININE 1.15* 0.89  CALCIUM 8.4* 8.2*    COAG Lab Results  Component  Value Date   INR 0.9 11/09/2008   INR 1.0 04/12/2008   INR 0.9 04/11/2008   No results found for: PTT

## 2015-04-11 NOTE — Progress Notes (Signed)
VASCULAR LAB PRELIMINARY  ARTERIAL  ABI completed:    RIGHT    LEFT    PRESSURE WAVEFORM  PRESSURE WAVEFORM  BRACHIAL 113 triphasic BRACHIAL 109 triphasic  DP   DP    AT 59 monophasic AT 66 monophasic  PT 83 monophasic PT 50 monophasic  PER   PER    GREAT TOE  NA GREAT TOE  NA    RIGHT LEFT  ABI 0.73 0.58     Guinevere Ferrari, RVT 04/11/2015, 11:21 AM

## 2015-04-11 NOTE — Progress Notes (Signed)
        I drew a CK lab to check for muscle breakdown per Dr. Stephens Shire request.  Theda Sers, EMMA [facility] PA-C

## 2015-04-11 NOTE — Clinical Documentation Improvement (Signed)
Possible Clinical Conditions?    Rhabdomyolysis__________________                                Other Condition___________________                 Cannot Clinically Determine_________   Supporting Information: CK total 04/09/2015 = 10,787 H&P 04/09/2015: "AKI - likely in the setting of dehydration, will obtain CK levels to evaluate for rhabdo" and  "Hyperkalemia - likely due to AKI and potential rhabdo, CK pending"   Treatment: Hydrate  Thank You,  Carrolyn Meiers, RN [email or web address] [phone]

## 2015-04-11 NOTE — Progress Notes (Signed)
Inpatient Diabetes Program Recommendations  AACE/ADA: New Consensus Statement on Inpatient Glycemic Control (2013)  Target Ranges:  Prepandial:   less than 140 mg/dL      Peak postprandial:   less than 180 mg/dL (1-2 hours)      Critically ill patients:  140 - 180 mg/dL   Results for [name], [name] (MRN [id number]) as of 04/11/2015 10:46  Ref. Range 04/10/2015 13:23 04/10/2015 14:02 04/10/2015 19:36 04/10/2015 21:30 04/11/2015 07:24  Glucose-Capillary Latest Ref Range: 65-99 mg/dL 59 (L) 110 (H) 103 (H) 121 (H) 111 (H)    Reason for assessment: hypoglycemia yesterday at 13:23pm  Diabetes history: Type 2 diabetes Outpatient Diabetes medications: Lantus 34 units qday, Metformin 500mg  bid Current orders for Inpatient glycemic control: Lantus 25 units qday, Novolog 0-9 units tid with meals  Please consider decreasing Lantus to 20 units qhs.   Gentry Fitz, RN, BA, MHA, CDE Diabetes Coordinator Inpatient Diabetes Program  [phone] (Team Pager) [phone] ([place]) 04/11/2015 10:55 AM

## 2015-04-12 ENCOUNTER — Inpatient Hospital Stay (HOSPITAL_COMMUNITY): Payer: Medicare Other

## 2015-04-12 DIAGNOSIS — E860 Dehydration: Secondary | ICD-10-CM

## 2015-04-12 DIAGNOSIS — I9581 Postprocedural hypotension: Secondary | ICD-10-CM

## 2015-04-12 DIAGNOSIS — I471 Supraventricular tachycardia: Secondary | ICD-10-CM

## 2015-04-12 LAB — TROPONIN I
TROPONIN I: 0.28 ng/mL — AB (ref <0.031)
Troponin I: 0.33 ng/mL — ABNORMAL HIGH (ref <0.031)
Troponin I: 0.24 ng/mL — ABNORMAL HIGH (ref <0.031)

## 2015-04-12 LAB — BASIC METABOLIC PANEL
ANION GAP: 7 (ref 5–15)
BUN: 8 mg/dL (ref 6–20)
CHLORIDE: 102 mmol/L (ref 101–111)
CO2: 23 mmol/L (ref 22–32)
CREATININE: 0.78 mg/dL (ref 0.44–1.00)
Calcium: 8 mg/dL — ABNORMAL LOW (ref 8.9–10.3)
GFR calc Af Amer: >60 mL/min (ref >60)
Glucose, Bld: 89 mg/dL (ref 65–99)
Potassium: 4.4 mmol/L (ref 3.5–5.1)
Sodium: 132 mmol/L — ABNORMAL LOW (ref 135–145)

## 2015-04-12 LAB — URINE CULTURE: Culture: NO GROWTH

## 2015-04-12 LAB — CBC
HEMATOCRIT: 38.6 % (ref 36.0–46.0)
Hemoglobin: 12.5 g/dL (ref 12.0–15.0)
MCH: 30.9 pg (ref 26.0–34.0)
MCHC: 32.4 g/dL (ref 30.0–36.0)
MCV: 95.3 fL (ref 78.0–100.0)
Platelets: 139 10*3/uL — ABNORMAL LOW (ref 150–400)
RBC: 4.05 MIL/uL (ref 3.87–5.11)
RDW: 13.8 % (ref 11.5–15.5)
WBC: 12 10*3/uL — ABNORMAL HIGH (ref 4.0–10.5)

## 2015-04-12 LAB — GLUCOSE, CAPILLARY
GLUCOSE-CAPILLARY: 88 mg/dL (ref 65–99)
GLUCOSE-CAPILLARY: 114 mg/dL — AB (ref 65–99)
GLUCOSE-CAPILLARY: 64 mg/dL — AB (ref 65–99)
GLUCOSE-CAPILLARY: 93 mg/dL (ref 65–99)
Glucose-Capillary: 80 mg/dL (ref 65–99)
Glucose-Capillary: 80 mg/dL (ref 65–99)
Glucose-Capillary: 134 mg/dL — ABNORMAL HIGH (ref 65–99)
Glucose-Capillary: 150 mg/dL — ABNORMAL HIGH (ref 65–99)

## 2015-04-12 LAB — URINALYSIS, ROUTINE W REFLEX MICROSCOPIC
BILIRUBIN URINE: NEGATIVE
Glucose, UA: >1000 mg/dL — AB
KETONES UR: 15 mg/dL — AB
Leukocytes, UA: NEGATIVE
NITRITE: NEGATIVE
Protein, ur: NEGATIVE mg/dL
Specific Gravity, Urine: 1.018 (ref 1.005–1.030)
UROBILINOGEN UA: 1 mg/dL (ref 0.0–1.0)
pH: 5 (ref 5.0–8.0)

## 2015-04-12 LAB — LIPID PANEL
CHOL/HDL RATIO: 2.5 ratio
Cholesterol: 80 mg/dL (ref 0–200)
HDL: 32 mg/dL — AB (ref >40)
LDL CALC: 37 mg/dL (ref 0–99)
Triglycerides: 53 mg/dL (ref <150)
VLDL: 11 mg/dL (ref 0–40)

## 2015-04-12 LAB — CORTISOL: Cortisol, Plasma: 28.3 ug/dL

## 2015-04-12 LAB — URINE MICROSCOPIC-ADD ON

## 2015-04-12 LAB — CK
Total CK: 11977 U/L — ABNORMAL HIGH (ref 38–234)
Total CK: 9987 U/L — ABNORMAL HIGH (ref 38–234)

## 2015-04-12 LAB — PROCALCITONIN: Procalcitonin: <0.1 ng/mL

## 2015-04-12 LAB — TSH: TSH: 0.569 u[IU]/mL (ref 0.350–4.500)

## 2015-04-12 MED ORDER — METOPROLOL TARTRATE 25 MG PO TABS
25.0000 mg | ORAL_TABLET | Freq: Once | ORAL | Status: AC
  Administered 2015-04-12: 25 mg via ORAL
  Filled 2015-04-12: qty 1

## 2015-04-12 MED ORDER — SODIUM CHLORIDE 0.9 % IV BOLUS (SEPSIS)
500.0000 mL | Freq: Once | INTRAVENOUS | Status: AC
  Administered 2015-04-12: 500 mL via INTRAVENOUS

## 2015-04-12 MED ORDER — SODIUM CHLORIDE 0.9 % IV BOLUS (SEPSIS)
1000.0000 mL | Freq: Once | INTRAVENOUS | Status: AC
  Administered 2015-04-12: 1000 mL via INTRAVENOUS

## 2015-04-12 MED ORDER — SODIUM CHLORIDE 0.9 % IV BOLUS (SEPSIS): 500.0000 mL | INTRAVENOUS | Status: DC | PRN

## 2015-04-12 MED ORDER — CETYLPYRIDINIUM CHLORIDE 0.05 % MT LIQD
7.0000 mL | Freq: Two times a day (BID) | OROMUCOSAL | Status: DC
  Administered 2015-04-12 – 2015-04-15 (×7): 7 mL via OROMUCOSAL

## 2015-04-12 NOTE — Evaluation (Signed)
Physical Therapy Evaluation Patient Details Name: [name] MRN: [id number] DOB: 10-12-1950  Today's Date: 04/12/2015   History of Present Illness  pt is a 64 y/o female admitted with R foot pain s/p Thrombectomy of tibial arteries, Vein patch right popliteal artery, Right femoral to below knee popliteal bypass with Propaten, angiogram.  Clinical Impression  Pt admitted with/for foot pain and for surgery including BPGing.  Pt currently limited functionally due to the problems listed below.  (see problems list.)  Pt will benefit from PT to maximize function and safety to be able to get home safely with available assist of family.     Follow Up Recommendations No PT follow up;Supervision for mobility/OOB    Equipment Recommendations  None recommended by PT    Recommendations for Other Services       Precautions / Restrictions Precautions Precautions: Fall      Mobility  Bed Mobility Overal bed mobility: Needs Assistance Bed Mobility: Supine to Sit     Supine to sit: Min guard;HOB elevated (heavy use of rails)        Transfers Overall transfer level: Needs assistance Equipment used: Rolling walker (2 wheeled) Transfers: Sit to/from [facility] Sit to Stand: Min assist;+2 safety/equipment         General transfer comment: cues for hand placement  Ambulation/Gait Ambulation/Gait assistance: Min assist Ambulation Distance (Feet): 20 Feet Assistive device: Rolling walker (2 wheeled) Gait Pattern/deviations: Step-to pattern Gait velocity: slow and deliberate   General Gait Details: cues for sequencing to decrease pain.  Mild steppage gait on R due to some foot drop.  Stairs            Wheelchair Mobility    Modified Rankin (Stroke Patients Only)       Balance Overall balance assessment: Needs assistance Sitting-balance support: Feet supported Sitting balance-Leahy Scale: Good     Standing balance support: Bilateral upper extremity  supported Standing balance-Leahy Scale: Poor Standing balance comment: requires min A and bil. UE support                              Pertinent Vitals/Pain Pain Assessment: 0-10 Pain Score: 5  Pain Location: Rt LE  Pain Descriptors / Indicators: Aching;Constant Pain Intervention(s): Monitored during session;Premedicated before session    Home Living Family/patient expects to be discharged to:: Private residence Living Arrangements: Spouse/significant other Available Help at Discharge: Available 24 hours/day;Family Type of Home: Mobile home Home Access: Stairs to enter Entrance Stairs-Rails: Psychiatric nurse of Steps: 2 Home Layout: One level Home Equipment: Walker - 2 wheels;Cane - single point;Bedside commode;Shower seat      Prior Function Level of Independence: Independent         Comments: Usually did not need AD, but just before admission started using RW     Hand Dominance        Extremity/Trunk Assessment   Upper Extremity Assessment: Overall WFL for tasks assessed           Lower Extremity Assessment: Overall WFL for tasks assessed RLE Deficits / Details: significant weakness in df..    Cervical / Trunk Assessment: Normal  Communication   Communication: No difficulties  Cognition Arousal/Alertness: Awake/alert Behavior During Therapy: WFL for tasks assessed/performed Overall Cognitive Status: Within Functional Limits for tasks assessed                      General Comments General comments (  skin integrity, edema, etc.): HR max 140, but 112 at end of session.  02 sats >96% on RA    Exercises        Assessment/Plan    PT Assessment Patient needs continued PT services  PT Diagnosis Difficulty walking;Abnormality of gait;Acute pain   PT Problem List Decreased strength;Decreased mobility;Decreased knowledge of use of DME;Pain;Decreased activity tolerance  PT Treatment Interventions DME instruction;Gait  training;Stair training;Functional mobility training;Patient/family education   PT Goals (Current goals can be found in the Care Plan section) Acute Rehab PT Goals Patient Stated Goal: to go home  PT Goal Formulation: With patient Time For Goal Achievement: 04/19/15 Potential to Achieve Goals: Good    Frequency Min 3X/week   Barriers to discharge        Co-evaluation PT/OT/SLP Co-Evaluation/Treatment: Yes Reason for Co-Treatment: For patient/therapist safety PT goals addressed during session: Mobility/safety with mobility OT goals addressed during session: ADL's and self-care       End of Session   Activity Tolerance: Patient tolerated treatment well Patient left: in chair;with call bell/phone within reach Nurse Communication: Mobility status         Time: 1305-1330 PT Time Calculation (min) (ACUTE ONLY): 25 min   Charges:   PT Evaluation $Initial PT Evaluation Tier I: 1 Procedure     PT G Codes:        Skyra Crichlow, [name] 04/12/2015, 1:47 PM 04/12/2015  Donnella Sham, [place] [phone]  (pager)

## 2015-04-12 NOTE — Progress Notes (Signed)
UR COMPLETED  

## 2015-04-12 NOTE — Progress Notes (Addendum)
Patient Demographics:    [name], is a 64 y.o. female, DOB - 1951-09-08, NTI:[id number]  Admit date - 04/09/2015   Admitting Physician Costin Karlyne Greenspan, MD  Outpatient Primary MD for the patient is Maricela Curet, MD  LOS - 3   Chief Complaint  Patient presents with  . Foot Pain        Subjective:    [name] today has, No headache, No chest pain, No abdominal pain - No Nausea, No new weakness tingling or numbness, No Cough - SOB. Is having intense constant right lower extremity nonradiating pain no aggravating or relieving factors.   Assessment  & Plan :    1. 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 Ask neurosurgery on board, status post Thrombectomy of tibial arteries, Vein patch right popliteal artery, Right femoral to below knee popliteal bypass with Propaten, angiogram by Dr. Trula Slade on 04/10/2015, right leg appears more warm but poor sensation in toe remains. Further management per vascular surgery for this problem. We'll place her on statin on aspirin already.   2. COPD. At baseline no acute issues, no wheezing on exam, supportive care with oxygen & Neb treatments as needed.   3. Essential hypertension - blood pressure low-dose beta blocker as tolerated.   4. Smoking. Counseled to quit   5. Depression. Continue Celexa.   6. GERD. On PPI continue.   7. Hyponatremia. Proving with hydration.   8. Combination of hypotension with tachycardia. Appears to be dehydrated clinically along with the stress from #1 above, responding to IV fluid boluses, so far 5.5lits +ve, TSH and cortisol are appropriate. Lactic acid is 1.9 and procalcitonin is unremarkable. Echocardiogram has been ordered. EKG shows junctional tachycardia versus sinus tachycardia. We will  monitor closely. Low-dose beta blocker if tolerated by blood pressure.   9. Rhabdomyolysis due to #1 above. Hydrate and monitor.   10. Mild non-ACS turned troponin rise. Due to nonstop tachycardia, on aspirin and statin, beta blocker as tolerated. No chest pain, EKG nonacute, obtain echocardiogram to evaluate wall motion and EF.   11. GERD. On PPI.     Code Status : Full  Family Communication  : None present  Disposition Plan  : To be decided  Consults  :  Vascular surgeon Dr. Trula Slade  Procedures  :    Right leg venous duplex negative ultrasound  Right leg arteriogram per vascular surgery  Right -Thrombectomy of tibial arteries, Vein patch right popliteal artery, Right femoral to below knee popliteal bypass with Propaten, angiogram - 04-10-15  TTE   DVT Prophylaxis  :   Lovenox   Lab Results  Component Value Date   PLT 139* 04/12/2015    Inpatient Medications  Scheduled Meds: . antiseptic oral rinse  7 mL Mouth Rinse BID  . aspirin  325 mg Oral Daily  . atorvastatin  20 mg Oral q1800  . cephALEXin  500 mg Oral TID AC & HS  . citalopram  20 mg Oral Daily  . docusate sodium  100 mg Oral Daily  . enoxaparin (LOVENOX) injection  40 mg Subcutaneous Q24H  . insulin aspart  0-9 Units Subcutaneous TID WC  . insulin glargine  25 Units Subcutaneous QHS  . metoprolol tartrate  12.5 mg  Oral BID  . metoprolol tartrate  25 mg Oral Once  . pantoprazole  40 mg Oral Daily   Continuous Infusions: . sodium chloride 125 mL/hr at 04/12/15 0430   PRN Meds:.acetaminophen, ALPRAZolam, alum & mag hydroxide-simeth, bisacodyl, guaiFENesin-dextromethorphan, hydrALAZINE, HYDROmorphone (DILAUDID) injection, levalbuterol, magnesium sulfate 1 - 4 g bolus IVPB, morphine injection, ondansetron (ZOFRAN) IV, oxyCODONE-acetaminophen, phenol, potassium chloride, senna-docusate  Antibiotics  :    Anti-infectives    Start     Dose/Rate Route Frequency Ordered Stop   04/10/15 2200  vancomycin  (VANCOCIN) IVPB 1000 mg/200 mL premix     1,000 mg 200 mL/hr over 60 Minutes Intravenous Every 12 hours 04/10/15 2020 04/11/15 1136   04/10/15 0030  cephALEXin (KEFLEX) capsule 500 mg  Status:  Discontinued     500 mg Oral 3 times daily before meals & bedtime 04/09/15 2345 04/09/15 2347   04/10/15 0030  cephALEXin (KEFLEX) capsule 500 mg     500 mg Oral 3 times daily before meals & bedtime 04/09/15 2347 04/13/15 2359        Objective:   Filed Vitals:   04/12/15 0800 04/12/15 0900 04/12/15 1000 04/12/15 1100  BP: 107/57 111/59 107/65 127/83  Pulse: 127 134 117 127  Temp:      TempSrc:      Resp: 18 18 14 15   Height:      Weight:      SpO2: 100%       Wt Readings from Last 3 Encounters:  04/09/15 76.3 kg (168 lb 3.4 oz)  12/26/13 66.67 kg (146 lb 15.7 oz)  08/05/13 73.029 kg (161 lb)     Intake/Output Summary (Last 24 hours) at 04/12/15 1207 Last data filed at 04/12/15 0756  Gross per 24 hour  Intake   3990 ml  Output   1025 ml  Net   2965 ml     Physical Exam  Awake Alert, Oriented X 3, No new F.N deficits, Normal affect [place].AT,PERRAL Supple Neck,No JVD, No cervical lymphadenopathy appriciated.  Symmetrical Chest wall movement, Good air movement bilaterally, CTAB RRR,No Gallops,Rubs or new Murmurs, No Parasternal Heave +ve B.Sounds, Abd Soft, No tenderness, No organomegaly appriciated, No rebound - guarding or rigidity. No Cyanosis, Clubbing or edema, No new Rash or bruise, right leg warm to touch, for sensation in toes. Right leg postop drain in place.     Data Review:   Micro Results Recent Results (from the past 240 hour(s))  MRSA PCR Screening     Status: None   Collection Time: 04/09/15  8:29 PM  Result Value Ref Range Status   MRSA by PCR NEGATIVE NEGATIVE Final    Comment:        The GeneXpert MRSA Assay (FDA approved for NASAL specimens only), is one component of a comprehensive MRSA colonization surveillance program. It is not intended to  diagnose MRSA infection nor to guide or monitor treatment for MRSA infections.   Urine culture     Status: None   Collection Time: 04/10/15  9:00 PM  Result Value Ref Range Status   Specimen Description URINE, CATHETERIZED  Final   Special Requests NONE  Final   Culture NO GROWTH 2 DAYS  Final   Report Status 04/12/2015 FINAL  Final    Radiology Reports Dg Chest Port 1 View  04/12/2015   CLINICAL DATA:  Cough.  EXAM: PORTABLE CHEST - 1 VIEW  COMPARISON:  04/09/2015  FINDINGS: The cardiomediastinal contours are unchanged. Mild hyperinflation and central bronchitic change.  Pulmonary vasculature is normal. No consolidation, pleural effusion, or pneumothorax. No acute osseous abnormalities are seen.  IMPRESSION: Chronic hyperinflation and central bronchitic change. No superimposed acute process.   Electronically Signed   By: Jeb Levering M.D.   On: 04/12/2015 06:47   Dg Chest Port 1 View  04/09/2015   CLINICAL DATA:  Right calf red and swollen, severe pain  EXAM: PORTABLE CHEST - 1 VIEW  COMPARISON:  08/10/2010  FINDINGS: The heart size and mediastinal contours are within normal limits. Both lungs are clear. The visualized skeletal structures are unremarkable.  IMPRESSION: No active disease.   Electronically Signed   By: Skipper Cliche M.D.   On: 04/09/2015 17:16     CBC  Recent Labs Lab 04/09/15 1535 04/10/15 0012 04/11/15 0250 04/12/15 0223  WBC 14.4* 10.6* 14.3* 12.0*  HGB 19.1* 17.9* 13.4 12.5  HCT 54.6* 52.4* 41.1 38.6  PLT 249 214 160 139*  MCV 93.3 94.2 96.5 95.3  MCH 32.6 32.2 31.5 30.9  MCHC 35.0 34.2 32.6 32.4  RDW 13.5 13.7 13.6 13.8  LYMPHSABS 3.0  --   --   --   MONOABS 1.4*  --   --   --   EOSABS 0.0  --   --   --   BASOSABS 0.0  --   --   --     Chemistries   Recent Labs Lab 04/09/15 1535 04/10/15 0012 04/11/15 0250 04/12/15 0129  NA 131* 126* 131* 132*  K 5.5* 4.6 5.0 4.4  CL 93* 93* 98* 102  CO2 25 26 28 23   GLUCOSE 132* 172* 117* 89  BUN 25*  20 9 8   CREATININE 1.26* 1.15* 0.89 0.78  CALCIUM 10.0 8.4* 8.2* 8.0*   ------------------------------------------------------------------------------------------------------------------ estimated creatinine clearance is 74.1 mL/min (by C-G formula based on Cr of 0.78). ------------------------------------------------------------------------------------------------------------------  Recent Labs  04/09/15 1924  HGBA1C 8.2*   ------------------------------------------------------------------------------------------------------------------  Recent Labs  04/12/15 0223  CHOL 80  HDL 32*  LDLCALC 37  TRIG 53  CHOLHDL 2.5   ------------------------------------------------------------------------------------------------------------------  Recent Labs  04/12/15 0620  TSH 0.569   ------------------------------------------------------------------------------------------------------------------ No results for input(s): VITAMINB12, FOLATE, FERRITIN, TIBC, IRON, RETICCTPCT in the last 72 hours.  Coagulation profile No results for input(s): INR, PROTIME in the last 168 hours.  No results for input(s): DDIMER in the last 72 hours.  Cardiac Enzymes  Recent Labs Lab 04/12/15 1025  TROPONINI 0.28*   ------------------------------------------------------------------------------------------------------------------ Invalid input(s): POCBNP   Results for ADRINA, ARMIJO (MRN [id number]) as of 04/11/2015 10:15   Ref. Range 04/09/2015 19:24  CK Total Latest Ref Range: 38-234 U/L 10787 (H)    Time Spent in minutes   30   SINGH,PRASHANT K M.D on 04/12/2015 at 12:07 PM  Between 7am to 7pm - Pager - [phone]  After 7pm go to [email or web address] - password [email or web address]  [facility] -  Office  [phone]

## 2015-04-12 NOTE — Progress Notes (Signed)
Hypoglycemic Event  CBG: 64  Treatment: 15 GM carbohydrate snack  Symptoms: None  Follow-up CBG: Time:0750 CBG Result:80  Possible Reasons for Event: Unknown  Comments/MD notified: MD aware, at bedside rounding    [name]  Remember to initiate Hypoglycemia Order Set & complete

## 2015-04-12 NOTE — Progress Notes (Signed)
Notified MD of patients bp remaining low at 84/56 with a map of 63. Pt is asymptomatic. No orders received. Will continue to monitor.

## 2015-04-12 NOTE — Evaluation (Signed)
Occupational Therapy Evaluation Patient Details Name: [name] MRN: [id number] DOB: 1951/05/27 Today's Date: 04/12/2015    History of Present Illness pt is a 64 y/o female admitted with R foot pain s/p Thrombectomy of tibial arteries, Vein patch right popliteal artery, Right femoral to below knee popliteal bypass with Propaten, angiogram.   Clinical Impression   Pt admitted with above. She demonstrates the below listed deficits and will benefit from continued OT to maximize safety and independence with BADLs.  Pt currently requires max A for LB ADLs and min A +2 for functional mobility.  She is limited by pain, but has good family support.  Feel she will be able to discharge home with assist from family.  She has needed DME.  Will follow acutely.       Follow Up Recommendations  No OT follow up;Supervision/Assistance - 24 hour    Equipment Recommendations  None recommended by OT    Recommendations for Other Services       Precautions / Restrictions Precautions Precautions: Fall      Mobility Bed Mobility Overal bed mobility: Needs Assistance Bed Mobility: Supine to Sit     Supine to sit: Min guard;HOB elevated (heavy use of rails)        Transfers Overall transfer level: Needs assistance Equipment used: Rolling walker (2 wheeled) Transfers: Sit to/from [facility] Sit to Stand: Min assist;+2 safety/equipment         General transfer comment: cues for hand placement    Balance Overall balance assessment: Needs assistance Sitting-balance support: Feet supported Sitting balance-Leahy Scale: Good     Standing balance support: Bilateral upper extremity supported Standing balance-Leahy Scale: Poor Standing balance comment: requires min A and bil. UE support                             ADL Overall ADL's : Needs assistance/impaired Eating/Feeding: Independent;Sitting;Bed level   Grooming: Wash/dry hands;Wash/dry face;Oral  care;Brushing hair;Set up;Sitting   Upper Body Bathing: Set up;Sitting   Lower Body Bathing: Sit to/from stand;Maximal assistance   Upper Body Dressing : Set up;Sitting   Lower Body Dressing: Total assistance;Sit to/from stand Lower Body Dressing Details (indicate cue type and reason): limited by pain  Toilet Transfer: Minimal assistance;+2 for physical assistance;Ambulation;Comfort height toilet;Grab bars;RW;BSC   Toileting- Clothing Manipulation and Hygiene: Maximal assistance;Sit to/from stand       Functional mobility during ADLs: Minimal assistance;+2 for physical assistance;Rolling walker General ADL Comments: Pt limited by pain      Vision     Perception     Praxis      Pertinent Vitals/Pain Pain Assessment: 0-10 Pain Score: 5  Pain Location: Rt LE  Pain Descriptors / Indicators: Aching;Constant Pain Intervention(s): Monitored during session;Premedicated before session     Hand Dominance     Extremity/Trunk Assessment Upper Extremity Assessment Upper Extremity Assessment: Overall WFL for tasks assessed   Lower Extremity Assessment Lower Extremity Assessment: Overall WFL for tasks assessed RLE Deficits / Details: significant weakness in df..   Cervical / Trunk Assessment Cervical / Trunk Assessment: Normal   Communication Communication Communication: No difficulties   Cognition Arousal/Alertness: Awake/alert Behavior During Therapy: WFL for tasks assessed/performed Overall Cognitive Status: Within Functional Limits for tasks assessed                     General Comments       Exercises  Shoulder Instructions      Home Living Family/patient expects to be discharged to:: Private residence Living Arrangements: Spouse/significant other Available Help at Discharge: Available 24 hours/day;Family Type of Home: Mobile home Home Access: Stairs to enter Entrance Stairs-Number of Steps: 2 Entrance Stairs-Rails: Right;Left Home Layout:  One level     Bathroom Shower/Tub: Tub/shower unit;Curtain Shower/tub characteristics: Architectural technologist: Standard     Home Equipment: Environmental consultant - 2 wheels;Cane - single point;Bedside commode;Shower seat          Prior Functioning/Environment Level of Independence: Independent        Comments: Usually did not need AD, but just before admission started using RW    OT Diagnosis: Generalized weakness;Acute pain   OT Problem List: Decreased strength;Decreased activity tolerance;Impaired balance (sitting and/or standing);Decreased knowledge of use of DME or AE;Decreased knowledge of precautions;Pain   OT Treatment/Interventions: Self-care/ADL training;DME and/or AE instruction;Therapeutic activities;Patient/family education;Balance training    OT Goals(Current goals can be found in the care plan section) Acute Rehab OT Goals Patient Stated Goal: to go home  OT Goal Formulation: With patient Time For Goal Achievement: 04/26/15 Potential to Achieve Goals: Good ADL Goals Pt Will Perform Grooming: with min guard assist;standing Pt Will Perform Lower Body Bathing: with min guard assist;sit to/from stand;with adaptive equipment Pt Will Perform Lower Body Dressing: with min guard assist;with adaptive equipment;sit to/from stand Pt Will Transfer to Toilet: with min guard assist;ambulating;regular height toilet;bedside commode;grab bars Pt Will Perform Toileting - Clothing Manipulation and hygiene: sit to/from stand;with min guard assist Pt Will Perform Tub/Shower Transfer: Tub transfer;with min assist;ambulating;shower seat;rolling walker  OT Frequency: Min 2X/week   Barriers to D/C:            Co-evaluation PT/OT/SLP Co-Evaluation/Treatment: Yes Reason for Co-Treatment: For patient/therapist safety PT goals addressed during session: Mobility/safety with mobility OT goals addressed during session: ADL's and self-care      End of Session Equipment Utilized During Treatment:  Rolling walker Nurse Communication: Mobility status  Activity Tolerance: Patient tolerated treatment well Patient left: in chair;with call bell/phone within reach   Time: 5170-0174 OT Time Calculation (min): 27 min Charges:  OT General Charges $OT Visit: 1 Procedure OT Evaluation $Initial OT Evaluation Tier I: 1 Procedure G-Codes:    Lucille Passy M 2015-05-02, 1:47 PM

## 2015-04-12 NOTE — Consult Note (Signed)
CONSULT NOTE  Date: 04/12/2015               Patient Name:  [name] MRN: [id number]  DOB: [birth year] Age / Sex: 64 y.o., female        PCP: DONDIEGO,RICHARD M Primary Cardiologist: New/ Nahser            Referring Physician: Ronnie Derby              Reason for Consult: Hypotension and sinus tachy           History of Present Illness: Patient is a 64 y.o. female with a PMHx of PVD, cigarette smoking , who was admitted to [facility] on 04/09/2015 for evaluation of ischemic right foot. She had revascularization 2 days ago. Today and yesterday she's had a relatively low blood pressure with sinus tachycardia. She is in no pain. She denies ever having any episodes of chest pain. She does have some shortness of breath but has been a long-time smoker. The team has had a difficult time in keeping her blood pressure above 90. She's had this persistent sinus tachycardia and 110 and 120 range. She's currently not in any distress. An echocardiogram has been ordered but is not done yet..      Medications: Outpatient medications: Prescriptions prior to admission  Medication Sig Dispense Refill Last Dose  . ALPRAZolam (XANAX) 0.25 MG tablet Take 0.25 mg by mouth 4 (four) times daily as needed for anxiety.    04/09/2015 at Unknown time  . cephALEXin (KEFLEX) 500 MG capsule Take 500 mg by mouth 4 (four) times daily. For 7 days(started 04/07/15)   04/09/2015 at Unknown time  . citalopram (CELEXA) 20 MG tablet Take 20 mg by mouth daily.   04/09/2015 at Unknown time  . Insulin Glargine (LANTUS SOLOSTAR) 100 UNIT/ML Solostar Pen Inject 34 Units into the skin at bedtime.   04/08/2015 at Unknown time  . lisinopril (PRINIVIL,ZESTRIL) 20 MG tablet Take 20 mg by mouth daily.   04/09/2015 at Unknown time  . metFORMIN (GLUCOPHAGE) 500 MG tablet Take 500 mg by mouth 2 (two) times daily with a meal.   04/09/2015 at Unknown time  . naproxen (NAPROSYN) 500 MG tablet Take 500 mg by mouth 2 (two) times daily.   Past  Month at Unknown time  . oxyCODONE-acetaminophen (PERCOCET) 5-325 MG per tablet Take 1 tablet by mouth every 8 (eight) hours as needed for severe pain. 30 tablet 0 Past Week at Unknown time  . pantoprazole (PROTONIX) 40 MG tablet Take 1 tablet (40 mg total) by mouth daily at 12 noon. (Patient taking differently: Take 40 mg by mouth daily. ) 30 tablet 2 04/09/2015 at Unknown time    Current medications: Current Facility-Administered Medications  Medication Dose Route Frequency Provider Last Rate Last Dose  . 0.9 %  sodium chloride infusion   Intravenous Continuous Ulyses Amor, PA-C 125 mL/hr at 04/12/15 1500    . acetaminophen (TYLENOL) tablet 650 mg  650 mg Oral Q4H PRN Conrad Burdette, MD      . ALPRAZolam Duanne Moron) tablet 0.25 mg  0.25 mg Oral QID PRN Caren Griffins, MD      . alum & mag hydroxide-simeth (MAALOX/MYLANTA) 200-200-20 MG/5ML suspension 15-30 mL  15-30 mL Oral Q2H PRN Ulyses Amor, PA-C      . antiseptic oral rinse (CPC / CETYLPYRIDINIUM CHLORIDE 0.05%) solution 7 mL  7 mL Mouth Rinse BID Thurnell Lose, MD  7 mL at 04/12/15 1000  . aspirin tablet 325 mg  325 mg Oral Daily Ulyses Amor, PA-C   325 mg at 04/12/15 1032  . atorvastatin (LIPITOR) tablet 20 mg  20 mg Oral q1800 Thurnell Lose, MD   20 mg at 04/11/15 1728  . bisacodyl (DULCOLAX) suppository 10 mg  10 mg Rectal Daily PRN Ulyses Amor, PA-C      . cephALEXin (KEFLEX) capsule 500 mg  500 mg Oral TID AC & HS Costin Karlyne Greenspan, MD   500 mg at 04/12/15 1310  . citalopram (CELEXA) tablet 20 mg  20 mg Oral Daily Costin Karlyne Greenspan, MD   20 mg at 04/12/15 1031  . docusate sodium (COLACE) capsule 100 mg  100 mg Oral Daily Ulyses Amor, PA-C   100 mg at 04/11/15 1036  . enoxaparin (LOVENOX) injection 40 mg  40 mg Subcutaneous Q24H Thurnell Lose, MD   40 mg at 04/12/15 0756  . guaiFENesin-dextromethorphan (ROBITUSSIN DM) 100-10 MG/5ML syrup 15 mL  15 mL Oral Q4H PRN Ulyses Amor, PA-C      . hydrALAZINE (APRESOLINE)  injection 5 mg  5 mg Intravenous Q20 Min PRN Ulyses Amor, PA-C      . HYDROmorphone (DILAUDID) injection 0.5-1 mg  0.5-1 mg Intravenous Q3H PRN Caren Griffins, MD   1 mg at 04/11/15 2042  . insulin aspart (novoLOG) injection 0-9 Units  0-9 Units Subcutaneous TID WC Thurnell Lose, MD   1 Units at 04/12/15 1310  . insulin glargine (LANTUS) injection 25 Units  25 Units Subcutaneous QHS Caren Griffins, MD   25 Units at 04/11/15 2218  . levalbuterol (XOPENEX) nebulizer solution 0.63 mg  0.63 mg Nebulization Q6H PRN Costin Karlyne Greenspan, MD      . magnesium sulfate IVPB 2 g 50 mL  2 g Intravenous Daily PRN Ulyses Amor, PA-C      . metoprolol tartrate (LOPRESSOR) tablet 12.5 mg  12.5 mg Oral BID Thurnell Lose, MD   12.5 mg at 04/11/15 2200  . morphine 2 MG/ML injection 2 mg  2 mg Intravenous Q3H PRN Conrad Rio Lucio, MD   2 mg at 04/11/15 1117  . ondansetron (ZOFRAN) injection 4 mg  4 mg Intravenous Q6H PRN Conrad [place], MD   4 mg at 04/10/15 2051  . oxyCODONE-acetaminophen (PERCOCET/ROXICET) 5-325 MG per tablet 1-2 tablet  1-2 tablet Oral Q6H PRN Caren Griffins, MD   1 tablet at 04/12/15 1130  . pantoprazole (PROTONIX) EC tablet 40 mg  40 mg Oral Daily Costin Karlyne Greenspan, MD   40 mg at 04/12/15 1031  . phenol (CHLORASEPTIC) mouth spray 1 spray  1 spray Mouth/Throat PRN Ulyses Amor, PA-C      . potassium chloride SA (K-DUR,KLOR-CON) CR tablet 20-40 mEq  20-40 mEq Oral Daily PRN Ulyses Amor, PA-C      . senna-docusate (Senokot-S) tablet 1 tablet  1 tablet Oral QHS PRN Ulyses Amor, PA-C      . sodium chloride 0.9 % bolus 500 mL  500 mL Intravenous PRN Thurnell Lose, MD         Allergies  Allergen Reactions  . Penicillins Other (See Comments)    Unknown reaction  . Ciprofloxacin Hives     Past Medical History  Diagnosis Date  . Hypertension   . Diabetes mellitus without complication   . COPD (chronic obstructive pulmonary disease)   . Stroke   .  Chronic back pain     Past  Surgical History  Procedure Laterality Date  . Back fusion    . Cholecystectomy    . Foot surgery Bilateral   . Cardiac catheterization    . Peripheral vascular catheterization N/A 04/10/2015    Procedure: Abdominal Aortogram w/Lower Extremity;  Surgeon: Conrad Macon, MD;  Location: [facility];  Service: Cardiovascular;  Laterality: N/A;  . Thrombectomy femoral artery Right 04/10/2015    Procedure: THROMBECTOMY RIGHT TIBIAL  ARTERY WITH VEIN PATCH ANGIOPLASTY;  Surgeon: Elam Dutch, MD;  Location: [facility];  Service: Vascular;  Laterality: Right;  . Fasciotomy Right 04/10/2015    Procedure: FOUR COMPARTMENT FASCIOTOMY;  Surgeon: Elam Dutch, MD;  Location: [facility] OR;  Service: Vascular;  Laterality: Right;  . Femoral-popliteal bypass graft Right 04/10/2015    Procedure: RIGHT  FEMORAL-BELOW THE KNEE POPLITEAL ARTERY BYPASS GRAFT USING 6 MM X 80 CM PROPATEN GRAFT;  Surgeon: Elam Dutch, MD;  Location: [facility];  Service: Vascular;  Laterality: Right;  . Intraoperative arteriogram Right 04/10/2015    Procedure: INTRA OPERATIVE ARTERIOGRAM;  Surgeon: Elam Dutch, MD;  Location: [facility];  Service: Vascular;  Laterality: Right;    Family History  Problem Relation Age of Onset  . Cancer Father     Social History:  reports that she has been smoking Cigarettes.  She has a 60 pack-year smoking history. She has never used smokeless tobacco. She reports that she does not drink alcohol or use illicit drugs.   Review of Systems: Constitutional:  denies fever, chills, diaphoresis, appetite change and fatigue.  HEENT: denies photophobia, eye pain, redness, hearing loss, ear pain, congestion, sore throat, rhinorrhea, sneezing, neck pain, neck stiffness and tinnitus.  Respiratory: denies SOB, DOE, cough, chest tightness, and wheezing.  Cardiovascular: denies chest pain, palpitations and leg swelling.  Gastrointestinal: denies nausea, vomiting, abdominal pain, diarrhea, constipation, blood  in stool.  Genitourinary: denies dysuria, urgency, frequency, hematuria, flank pain and difficulty urinating.  Musculoskeletal: denies  myalgias, back pain, joint swelling, arthralgias and gait problem.   Skin: denies pallor, rash and wound.  Neurological: denies dizziness, seizures, syncope, weakness, light-headedness, numbness and headaches.   Hematological: denies adenopathy, easy bruising, personal or family bleeding history.  Psychiatric/ Behavioral: denies suicidal ideation, mood changes, confusion, nervousness, sleep disturbance and agitation.    Physical Exam: BP 84/53 mmHg  Pulse 117  Temp(Src) 98.1 F (36.7 C) (Oral)  Resp 15  Ht 5\' 6"  (1.676 m)  Wt 76.3 kg (168 lb 3.4 oz)  BMI 27.16 kg/m2  SpO2 100%  Wt Readings from Last 3 Encounters:  04/09/15 76.3 kg (168 lb 3.4 oz)  12/26/13 66.67 kg (146 lb 15.7 oz)  08/05/13 73.029 kg (161 lb)    General: Vital signs reviewed and noted. Chronically ill-appearing female in no acute distress   Head: Normocephalic, atraumatic, sclera anicteric,   Neck: Supple. Negative for carotid bruits. No JVD   Lungs:  Clear bilaterally, no  wheezes, rales, or rhonchi. Breathing is normal   Heart: RRR with S1 S2. She is mildly tachycardic.   Abdomen/ GI :  Soft, non-tender, non-distended with normoactive bowel sounds. No hepatomegaly. No rebound/guarding. No obvious abdominal masses   MSK: Strength and the appear normal for age.   Extremities: No clubbing or cyanosis. She is she has surgical changes of her right leg. She has some 1+ pitting edema in that leg only.   Neurologic:  CN are grossly intact,  No  obvious motor or sensory defect.  Alert and oriented X 3. Moves all extremities spontaneously.  Psych: Responds to questions appropriately with a normal affect.     Lab results: Basic Metabolic Panel:  Recent Labs Lab 04/10/15 0012 04/11/15 0250 04/12/15 0129  NA 126* 131* 132*  K 4.6 5.0 4.4  CL 93* 98* 102  CO2 26 28 23   GLUCOSE  172* 117* 89  BUN 20 9 8   CREATININE 1.15* 0.89 0.78  CALCIUM 8.4* 8.2* 8.0*    Liver Function Tests: No results for input(s): AST, ALT, ALKPHOS, BILITOT, PROT, ALBUMIN in the last 168 hours. No results for input(s): LIPASE, AMYLASE in the last 168 hours. No results for input(s): AMMONIA in the last 168 hours.  CBC:  Recent Labs Lab 04/09/15 1535 04/10/15 0012 04/11/15 0250 04/12/15 0223  WBC 14.4* 10.6* 14.3* 12.0*  NEUTROABS 10.0*  --   --   --   HGB 19.1* 17.9* 13.4 12.5  HCT 54.6* 52.4* 41.1 38.6  MCV 93.3 94.2 96.5 95.3  PLT 249 214 160 139*    Cardiac Enzymes:  Recent Labs Lab 04/09/15 1924 04/11/15 1103 04/12/15 0129 04/12/15 1025  CKTOTAL 96759* 13544* 11977* 9987*  TROPONINI  --   --   --  0.28*    BNP: Invalid input(s): POCBNP  CBG:  Recent Labs Lab 04/11/15 2323 04/12/15 0725 04/12/15 0803 04/12/15 1208 04/12/15 1606  GLUCAP 103* 64* 80 134* 150*    Coagulation Studies: No results for input(s): LABPROT, INR in the last 72 hours.   Other results: Personal review of EKG shows :  - Sinus tachycardia and she has no ST or T wave changes  Imaging: Dg Chest Port 1 View  04/12/2015   CLINICAL DATA:  Cough.  EXAM: PORTABLE CHEST - 1 VIEW  COMPARISON:  04/09/2015  FINDINGS: The cardiomediastinal contours are unchanged. Mild hyperinflation and central bronchitic change. Pulmonary vasculature is normal. No consolidation, pleural effusion, or pneumothorax. No acute osseous abnormalities are seen.  IMPRESSION: Chronic hyperinflation and central bronchitic change. No superimposed acute process.   Electronically Signed   By: Jeb Levering M.D.   On: 04/12/2015 06:47       Assessment & Plan:  1. Sinus tachycardia: I think that her sinus tachycardia is due to her low intravascular volume as well as her post operative changes. I think that her sinus tachycardia will resolve as we get her better hydrated. She's currently receiving normal saline she may  need received some albumin. An echocardiogram has been ordered. We'll get a much better idea after we look at her echo card gram.  2. Hypotension: I think that this is due to volume depletion. She's not having any chest pain. There is no clinical evidence for pulmonary embolus.  If she has any evidence of right ventricular enlargement on her echo then we'll need to consider pulmonary embolus.  She denies any pain.    Thayer Headings, Brooke Bonito., MD, [facility] 04/12/2015, 4:25 PM Office - [phone] Pager [phone]

## 2015-04-12 NOTE — Progress Notes (Signed)
Orthopedic Tech Progress Note Patient Details:  [name] Apr 20, 1951 [id number]  Patient ID: [name], female   DOB: 19-Jan-1951, 64 y.o.   MRN: [id number] Called in bio-tech brace order; spoke with Raul Del, Eun Vermeer 04/12/2015, 9:46 AM

## 2015-04-12 NOTE — Progress Notes (Signed)
Received order to give 25mg  lopressor even though bp has been low. Clarified order with MD. bp was ok when given med, bp= 112/66 HR 133. BP now low ranging 86/56-98/56 HR=119. Pt is asymptomatic. Notified MD to make aware. No orders received. Will continue to monitor.

## 2015-04-12 NOTE — Progress Notes (Addendum)
[facility]   Subjective  - Pain well controlled states she just feels tired.   Objective 93/49 126 98.6 F (37 C) (Oral) 16 99%  Intake/Output Summary (Last 24 hours) at 04/12/15 0721 Last data filed at 04/12/15 6384  Gross per 24 hour  Intake   3595 ml  Output   1025 ml  Net   2570 ml    Doppler DP/PT signals bilateral Right foot and lateral leg with slight erythema and warmth  Minimal toe active range of motion sensation loss from the ankle down to light touch.  No ankle active range of motion. Incisions intact and healing well Heart tachycardic 130's BP 90/50 Lungs non labored   Assessment/Planning: POD # 2 Thrombectomy of tibial arteries, Vein patch right popliteal artery, Right femoral to below knee popliteal bypass with Propaten, angiogram. -Wound vac in place right lateral leg. afebrile and WBC 12.0 decreased from 04/11/2015 14.3 -UO 1025 past 24 hours will continue IV [place] @ 125 1 Liter bolus ordered this am.  -Continues to be tachycardic 120's to 130's and hypotensive. -Chest x ray findings 04/12/2015 Pulmonary vasculature is normal. No consolidation, pleural effusion, or pneumothorax. No acute osseous abnormalities are seen. -CK elevated likely due to rhabdomyolysis of the left leg -Cr 1.26 elevated at admission likely due to dehydration.  Cr WNL 0.78 -DVT prophylaxis Lovenox SQ -325 mg Aspirin antiplatelet -DM SSI  Glover, Debra MAUREEN 04/12/2015 7:21 AM -- Right foot warm, edematous, doppler signals, VAC on lateral fasciotomy, primarily pain in groin.  Minimal toe and ankle movement.   Left arm BP 15 mm Hg lower than right.  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  Would check BP in left arm only as some of her hypotension may be spurious.  She has some tachycardia but this may also be pain related WBC trending down and Hgb is 12 so no real clinical reason for her to have hypotension.     Hyponatremia trend for now.  Hopefully saline lock IV tomorrow.    Needs to get out of bed PT/OT to try to improve right leg function.  Will recheck fasciotomy wound again tomorrow continue VAC.  Continue to trend CK, check UA for myoglobin, viability of right leg muscle still in question  Will check troponin to rule out cardiac source of hypotension  Ruta Hinds, MD [facility] [place]: [phone] Pager: [phone]    Laboratory Lab Results:  Recent Labs  04/11/15 0250 04/12/15 0223  WBC 14.3* 12.0*  HGB 13.4 12.5  HCT 41.1 38.6  PLT 160 139*   BMET  Recent Labs  04/11/15 0250 04/12/15 0129  NA 131* 132*  K 5.0 4.4  CL 98* 102  CO2 28 23  GLUCOSE 117* 89  BUN 9 8  CREATININE 0.89 0.78  CALCIUM 8.2* 8.0*    COAG Lab Results  Component Value Date   INR 0.9 11/09/2008   INR 1.0 04/12/2008   INR 0.9 04/11/2008   No results found for: PTT

## 2015-04-13 ENCOUNTER — Inpatient Hospital Stay (HOSPITAL_COMMUNITY): Payer: Medicare Other

## 2015-04-13 ENCOUNTER — Ambulatory Visit (HOSPITAL_COMMUNITY): Payer: Medicare Other

## 2015-04-13 DIAGNOSIS — I4891 Unspecified atrial fibrillation: Secondary | ICD-10-CM | POA: Insufficient documentation

## 2015-04-13 DIAGNOSIS — I509 Heart failure, unspecified: Secondary | ICD-10-CM

## 2015-04-13 LAB — CBC
HCT: 33.8 % — ABNORMAL LOW (ref 36.0–46.0)
Hemoglobin: 11.2 g/dL — ABNORMAL LOW (ref 12.0–15.0)
MCH: 31.6 pg (ref 26.0–34.0)
MCHC: 33.1 g/dL (ref 30.0–36.0)
MCV: 95.5 fL (ref 78.0–100.0)
PLATELETS: 128 10*3/uL — AB (ref 150–400)
RBC: 3.54 MIL/uL — AB (ref 3.87–5.11)
RDW: 13.7 % (ref 11.5–15.5)
WBC: 9.5 10*3/uL (ref 4.0–10.5)

## 2015-04-13 LAB — GLUCOSE, CAPILLARY
GLUCOSE-CAPILLARY: 150 mg/dL — AB (ref 65–99)
Glucose-Capillary: 54 mg/dL — ABNORMAL LOW (ref 65–99)
Glucose-Capillary: 105 mg/dL — ABNORMAL HIGH (ref 65–99)
Glucose-Capillary: 198 mg/dL — ABNORMAL HIGH (ref 65–99)
Glucose-Capillary: 104 mg/dL — ABNORMAL HIGH (ref 65–99)

## 2015-04-13 LAB — BASIC METABOLIC PANEL
Anion gap: 5 (ref 5–15)
BUN: 8 mg/dL (ref 6–20)
CO2: 24 mmol/L (ref 22–32)
Calcium: 8.3 mg/dL — ABNORMAL LOW (ref 8.9–10.3)
Chloride: 106 mmol/L (ref 101–111)
Creatinine, Ser: 0.8 mg/dL (ref 0.44–1.00)
GFR calc non Af Amer: >60 mL/min (ref >60)
Glucose, Bld: 71 mg/dL (ref 65–99)
Potassium: 4 mmol/L (ref 3.5–5.1)
SODIUM: 135 mmol/L (ref 135–145)

## 2015-04-13 MED ORDER — SODIUM CHLORIDE 0.9 % IV SOLN
INTRAVENOUS | Status: DC
  Administered 2015-04-13 – 2015-04-14 (×2): via INTRAVENOUS

## 2015-04-13 MED ORDER — PERFLUTREN LIPID MICROSPHERE
1.0000 mL | INTRAVENOUS | Status: AC | PRN
  Administered 2015-04-13: 3 mL via INTRAVENOUS
  Filled 2015-04-13: qty 10

## 2015-04-13 MED ORDER — INSULIN GLARGINE 100 UNIT/ML ~~LOC~~ SOLN
18.0000 [IU] | Freq: Every day | SUBCUTANEOUS | Status: DC
  Administered 2015-04-13 – 2015-04-14 (×2): 18 [IU] via SUBCUTANEOUS
  Filled 2015-04-13 (×3): qty 0.18

## 2015-04-13 NOTE — Progress Notes (Signed)
  Echocardiogram 2D Echocardiogram has been performed.  [name] 04/13/2015, 12:20 PM

## 2015-04-13 NOTE — Progress Notes (Signed)
Report called to RN. Pt transferring to 2W12 via bed with belongings.

## 2015-04-13 NOTE — Progress Notes (Addendum)
[facility] [place]  Subjective  - Doing well over all, difficulty sleeping last night couldn't get comfortable.   Objective 104/56 126 98.1 F (36.7 C) (Oral) 16 96%  Intake/Output Summary (Last 24 hours) at 04/13/15 0734 Last data filed at 04/13/15 0636  Gross per 24 hour  Intake   3860 ml  Output   1125 ml  Net   2735 ml    Doppler DP/PT bilateral Right LE incisions clean and dry Wound vac removed, pink/red muscle tissue, no frank dead tissue.  Wet to dry dressing placed over lateral calf fasciotomy site. Sensation improving now at dorsum of foot just below the ankle and increased toe active range of motion.  Walking AFO in the room donned to help with foot drop. Heart 124 tachy cardia, improved BP 130/90 Lungs non labored breathing on RA   Assessment/Planning: POD # 3 Thrombectomy of tibial arteries, Vein patch right popliteal artery, Right femoral to below knee popliteal bypass with Propaten, angiogram.  -Wound vac change today pending Dr. Oneida Alar opinion to replace vac verses wet to dry verses closure of wound?  Vac output 75 last 24 hours. -BP improved s/p bolus's of fluid.  Cardiology consult pending Echo.   -Sinus tachycardia persistent  -DVT prophylaxis Lovenox SQ -325 mg Aspirin antiplatelet -DM SSI -UO stable and Cr WNL 0.8 -WBC WNL 9.5 -We will keep her here one more day due to the volume given, Chest x ray pending this am.  Laurence Slate [facility] 04/13/2015 7:34 AM -- Pt clinically asymptomatic from heart rate BP changes Right leg muscle viable continue VAC Doppler signals right foot Incisions o/w healing  Will transfer to 2W Follow up ECHO results PT/OT Will need to consider Rehab  Ruta Hinds, MD [facility] [place]: [phone] Pager: [phone]  Laboratory Lab Results:  Recent Labs  04/12/15 0223 04/13/15 0250  WBC 12.0* 9.5  HGB 12.5 11.2*  HCT 38.6 33.8*  PLT 139* 128*    BMET  Recent Labs  04/12/15 0129 04/13/15 0250  NA 132* 135  K 4.4 4.0  CL 102 106  CO2 23 24  GLUCOSE 89 71  BUN 8 8  CREATININE 0.78 0.80  CALCIUM 8.0* 8.3*    COAG Lab Results  Component Value Date   INR 0.9 11/09/2008   INR 1.0 04/12/2008   INR 0.9 04/11/2008   No results found for: PTT

## 2015-04-13 NOTE — Progress Notes (Signed)
Transfer report received from 3S at 1630 and pt arrived to the unit via bed with belongings to the side; wound vac intact and suctioning; IV intact and transfusing; pt alert and verbally responsive; pt oriented to the unit and room; VSS; telemetry applied and verified; call light within reach. Pt in bed comfortably with call light within reach; prn pain med administered. Will closely monitor. Delia Heady RN

## 2015-04-13 NOTE — Progress Notes (Signed)
Results for DARLYNE, SCHMIESING (MRN [id number]) as of 04/13/2015 09:07  Ref. Range 04/12/2015 08:03 04/12/2015 12:08 04/12/2015 16:06 04/12/2015 22:09 04/13/2015 07:46  Glucose-Capillary Latest Ref Range: 65-99 mg/dL 80 134 (H) 150 (H) 93 54 (L)  CBGs continue to be less than 100 mg/dl. Recommend decreasing Lantus to 20 units daily if CBGs continue to be low. Will continue to follow while in hospital. Harvel Ricks RN BSN CDE

## 2015-04-13 NOTE — Progress Notes (Signed)
Patient Demographics:    [name], is a 64 y.o. female, DOB - 1951-04-07, JXB:[id number]  Admit date - 04/09/2015   Admitting Physician Costin Karlyne Greenspan, MD  Outpatient Primary MD for the patient is Maricela Curet, MD  LOS - 4   Chief Complaint  Patient presents with  . Foot Pain        Subjective:    [name] today has, No headache, No chest pain, No abdominal pain - No Nausea, No new weakness tingling or numbness, No Cough - SOB. Minimal right lower extremity pain much better than before.   Assessment  & Plan :    1. 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 Ask neurosurgery on board, status post Thrombectomy of tibial arteries, Vein patch right popliteal artery, Right femoral to below knee popliteal bypass with Propaten, angiogram by Dr. Trula Slade on 04/10/2015, right leg appears more warm with improving sensation, continues to have some weakness. Further management per vascular surgery for this problem. Continue aspirin and statin.   2. COPD. At baseline no acute issues, no wheezing on exam, supportive care with oxygen & Neb treatments as needed.   3. Essential hypertension - blood pressure low-dose beta blocker as tolerated.   4. Smoking. Counseled to quit   5. Depression. Continue Celexa.   6. GERD. On PPI continue.   7. Hyponatremia. Improving  with hydration.   8. Combination of hypotension with tachycardia. Appears to be dehydrated clinically along with the stress from #1 above, as been adequately hydrated with sufficient boluses, continue gentle hydration, chest x-ray still okay and patient is not short of breath, TSH and cortisol are appropriate. Lactic acid is 1.9 and procalcitonin is unremarkable. Echocardiogram has been ordered. EKG shows  junctional tachycardia versus sinus tachycardia. No chest pain or shortness of breath, cardiology has already evaluated the patient. Low-dose beta blocker if tolerated by blood pressure.   9. Rhabdomyolysis due to #1 above. Hydrate and monitor.   10. Mild non-ACS turned troponin rise. Due to nonstop tachycardia, on aspirin and statin, beta blocker as tolerated. No chest pain, EKG nonacute, obtain echocardiogram to evaluate wall motion and EF.   11. GERD. On PPI.     Code Status : Full  Family Communication  : None present  Disposition Plan  : To be decided  Consults  :  Vascular surgeon Dr. Trula Slade  Procedures  :    Right leg venous duplex negative ultrasound  Right leg arteriogram per vascular surgery  Right -Thrombectomy of tibial arteries, Vein patch right popliteal artery, Right femoral to below knee popliteal bypass with Propaten, angiogram - 04-10-15  TTE   DVT Prophylaxis  :   Lovenox   Lab Results  Component Value Date   PLT 128* 04/13/2015    Inpatient Medications  Scheduled Meds: . antiseptic oral rinse  7 mL Mouth Rinse BID  . aspirin  325 mg Oral Daily  . atorvastatin  20 mg Oral q1800  . cephALEXin  500 mg Oral TID AC & HS  . citalopram  20 mg Oral Daily  . docusate sodium  100 mg Oral Daily  . enoxaparin (LOVENOX) injection  40 mg Subcutaneous Q24H  . insulin aspart  0-9 Units Subcutaneous TID WC  .  insulin glargine  25 Units Subcutaneous QHS  . metoprolol tartrate  12.5 mg Oral BID  . pantoprazole  40 mg Oral Daily   Continuous Infusions: . sodium chloride     PRN Meds:.acetaminophen, ALPRAZolam, alum & mag hydroxide-simeth, bisacodyl, guaiFENesin-dextromethorphan, hydrALAZINE, HYDROmorphone (DILAUDID) injection, levalbuterol, magnesium sulfate 1 - 4 g bolus IVPB, morphine injection, ondansetron (ZOFRAN) IV, oxyCODONE-acetaminophen, phenol, potassium chloride, senna-docusate, sodium chloride  Antibiotics  :    Anti-infectives    Start      Dose/Rate Route Frequency Ordered Stop   04/10/15 2200  vancomycin (VANCOCIN) IVPB 1000 mg/200 mL premix     1,000 mg 200 mL/hr over 60 Minutes Intravenous Every 12 hours 04/10/15 2020 04/11/15 1136   04/10/15 0030  cephALEXin (KEFLEX) capsule 500 mg  Status:  Discontinued     500 mg Oral 3 times daily before meals & bedtime 04/09/15 2345 04/09/15 2347   04/10/15 0030  cephALEXin (KEFLEX) capsule 500 mg     500 mg Oral 3 times daily before meals & bedtime 04/09/15 2347 04/13/15 2359        Objective:   Filed Vitals:   04/13/15 0636 04/13/15 0700 04/13/15 0800 04/13/15 1116  BP:  130/90 131/74 101/59  Pulse: 126 122 77 116  Temp:   98.2 F (36.8 C) 98.6 F (37 C)  TempSrc:   Oral Oral  Resp: 16 24 18 20   Height:      Weight:      SpO2: 96% 98% 99% 97%    Wt Readings from Last 3 Encounters:  04/09/15 76.3 kg (168 lb 3.4 oz)  12/26/13 66.67 kg (146 lb 15.7 oz)  08/05/13 73.029 kg (161 lb)     Intake/Output Summary (Last 24 hours) at 04/13/15 1205 Last data filed at 04/13/15 1100  Gross per 24 hour  Intake   3475 ml  Output    625 ml  Net   2850 ml     Physical Exam  Awake Alert, Oriented X 3, No new F.N deficits, Normal affect [place].AT,PERRAL Supple Neck,No JVD, No cervical lymphadenopathy appriciated.  Symmetrical Chest wall movement, Good air movement bilaterally, CTAB RRR,No Gallops,Rubs or new Murmurs, No Parasternal Heave +ve B.Sounds, Abd Soft, No tenderness, No organomegaly appriciated, No rebound - guarding or rigidity. No Cyanosis, Clubbing or edema, No new Rash or bruise, right leg warm to touch, for sensation in toes. Right leg postop drain in place.     Data Review:   Micro Results Recent Results (from the past 240 hour(s))  MRSA PCR Screening     Status: None   Collection Time: 04/09/15  8:29 PM  Result Value Ref Range Status   MRSA by PCR NEGATIVE NEGATIVE Final    Comment:        The GeneXpert MRSA Assay (FDA approved for NASAL  specimens only), is one component of a comprehensive MRSA colonization surveillance program. It is not intended to diagnose MRSA infection nor to guide or monitor treatment for MRSA infections.   Urine culture     Status: None   Collection Time: 04/10/15  9:00 PM  Result Value Ref Range Status   Specimen Description URINE, CATHETERIZED  Final   Special Requests NONE  Final   Culture NO GROWTH 2 DAYS  Final   Report Status 04/12/2015 FINAL  Final    Radiology Reports Dg Chest Port 1 View  04/13/2015   CLINICAL DATA:  Shortness of breath.  History of COPD.  EXAM: PORTABLE CHEST - 1 VIEW  COMPARISON:  None.  FINDINGS: Stable COPD. Slight increase in interstitial prominence since the prior study may be consistent with a component of early interstitial edema. No focal airspace consolidation, pleural fluid or pneumothorax is identified. The heart size and mediastinal contours are within normal limits.  IMPRESSION: Stable COPD with slight increased interstitial prominence. This may be consistent with early interstitial edema.   Electronically Signed   By: Aletta Edouard M.D.   On: 04/13/2015 07:52   Dg Chest Port 1 View  04/12/2015   CLINICAL DATA:  Cough.  EXAM: PORTABLE CHEST - 1 VIEW  COMPARISON:  04/09/2015  FINDINGS: The cardiomediastinal contours are unchanged. Mild hyperinflation and central bronchitic change. Pulmonary vasculature is normal. No consolidation, pleural effusion, or pneumothorax. No acute osseous abnormalities are seen.  IMPRESSION: Chronic hyperinflation and central bronchitic change. No superimposed acute process.   Electronically Signed   By: Jeb Levering M.D.   On: 04/12/2015 06:47   Dg Chest Port 1 View  04/09/2015   CLINICAL DATA:  Right calf red and swollen, severe pain  EXAM: PORTABLE CHEST - 1 VIEW  COMPARISON:  08/10/2010  FINDINGS: The heart size and mediastinal contours are within normal limits. Both lungs are clear. The visualized skeletal structures are  unremarkable.  IMPRESSION: No active disease.   Electronically Signed   By: Skipper Cliche M.D.   On: 04/09/2015 17:16     CBC  Recent Labs Lab 04/09/15 1535 04/10/15 0012 04/11/15 0250 04/12/15 0223 04/13/15 0250  WBC 14.4* 10.6* 14.3* 12.0* 9.5  HGB 19.1* 17.9* 13.4 12.5 11.2*  HCT 54.6* 52.4* 41.1 38.6 33.8*  PLT 249 214 160 139* 128*  MCV 93.3 94.2 96.5 95.3 95.5  MCH 32.6 32.2 31.5 30.9 31.6  MCHC 35.0 34.2 32.6 32.4 33.1  RDW 13.5 13.7 13.6 13.8 13.7  LYMPHSABS 3.0  --   --   --   --   MONOABS 1.4*  --   --   --   --   EOSABS 0.0  --   --   --   --   BASOSABS 0.0  --   --   --   --     Chemistries   Recent Labs Lab 04/09/15 1535 04/10/15 0012 04/11/15 0250 04/12/15 0129 04/13/15 0250  NA 131* 126* 131* 132* 135  K 5.5* 4.6 5.0 4.4 4.0  CL 93* 93* 98* 102 106  CO2 25 26 28 23 24   GLUCOSE 132* 172* 117* 89 71  BUN 25* 20 9 8 8   CREATININE 1.26* 1.15* 0.89 0.78 0.80  CALCIUM 10.0 8.4* 8.2* 8.0* 8.3*   ------------------------------------------------------------------------------------------------------------------ estimated creatinine clearance is 74.1 mL/min (by C-G formula based on Cr of 0.8). ------------------------------------------------------------------------------------------------------------------ No results for input(s): HGBA1C in the last 72 hours. ------------------------------------------------------------------------------------------------------------------  Recent Labs  04/12/15 0223  CHOL 80  HDL 32*  LDLCALC 37  TRIG 53  CHOLHDL 2.5   ------------------------------------------------------------------------------------------------------------------  Recent Labs  04/12/15 0620  TSH 0.569   ------------------------------------------------------------------------------------------------------------------ No results for input(s): VITAMINB12, FOLATE, FERRITIN, TIBC, IRON, RETICCTPCT in the last 72 hours.  Coagulation profile No  results for input(s): INR, PROTIME in the last 168 hours.  No results for input(s): DDIMER in the last 72 hours.  Cardiac Enzymes  Recent Labs Lab 04/12/15 1025 04/12/15 1607 04/12/15 2250  TROPONINI 0.28* 0.33* 0.24*   ------------------------------------------------------------------------------------------------------------------ Invalid input(s): POCBNP   Results for TAKESHIA, WENK (MRN [id number]) as of 04/11/2015 10:15   Ref. Range 04/09/2015 19:24  CK Total  Latest Ref Range: 38-234 U/L 10787 (H)    Time Spent in minutes   30   Edelmiro Innocent K M.D on 04/13/2015 at 12:05 PM  Between 7am to 7pm - Pager - [phone]  After 7pm go to [email or web address] - password [email or web address]  [facility] -  Office  [phone]

## 2015-04-13 NOTE — Care Management Important Message (Signed)
Important Message  Patient Details  Name: [name] MRN: [id number] Date of Birth: 1951-05-26   Medicare Important Message Given:  Yes-third notification given    Nathen May 04/13/2015, 12:59 [place] Message  Patient Details  Name: [name] MRN: [id number] Date of Birth: January 12, 1951   Medicare Important Message Given:  Yes-third notification given    Nathen May 04/13/2015, 12:59 PM

## 2015-04-13 NOTE — Progress Notes (Signed)
SUBJECTIVE:  She has had intermittent tachycardia throughout the evening. There are abrupt starts and stops of the tachycardia. She is asymptomatic.  OBJECTIVE:   Vitals:   Filed Vitals:   04/13/15 0636 04/13/15 0700 04/13/15 0800 04/13/15 1116  BP:  130/90 131/74 101/59  Pulse: 126 122 77 116  Temp:   98.2 F (36.8 C) 98.6 F (37 C)  TempSrc:   Oral Oral  Resp: 16 24 18 20   Height:      Weight:      SpO2: 96% 98% 99% 97%   I&O's:   Intake/Output Summary (Last 24 hours) at 04/13/15 1241 Last data filed at 04/13/15 1100  Gross per 24 hour  Intake   3475 ml  Output    625 ml  Net   2850 ml   TELEMETRY: Reviewed telemetry pt in normal sinus rhythm with intermittent narrow complex tachycardia     PHYSICAL EXAM General: Well developed, well nourished, in no acute distress Head:   Normal cephalic and atramatic  Lungs:   Clear bilaterally to auscultation. Heart:   Tachycardic S1 S2  No JVD.   Abdomen: abdomen soft and non-tender Msk:  Back normal,  Normal strength and tone for age. Extremities:   No edema.   Neuro: Alert and oriented. Psych:  Normal affect, responds appropriately Skin: No rash   LABS: Basic Metabolic Panel:  Recent Labs  04/12/15 0129 04/13/15 0250  NA 132* 135  K 4.4 4.0  CL 102 106  CO2 23 24  GLUCOSE 89 71  BUN 8 8  CREATININE 0.78 0.80  CALCIUM 8.0* 8.3*   Liver Function Tests: No results for input(s): AST, ALT, ALKPHOS, BILITOT, PROT, ALBUMIN in the last 72 hours. No results for input(s): LIPASE, AMYLASE in the last 72 hours. CBC:  Recent Labs  04/12/15 0223 04/13/15 0250  WBC 12.0* 9.5  HGB 12.5 11.2*  HCT 38.6 33.8*  MCV 95.3 95.5  PLT 139* 128*   Cardiac Enzymes:  Recent Labs  04/11/15 1103 04/12/15 0129 04/12/15 1025 04/12/15 1607 04/12/15 2250  CKTOTAL 12458* 09983* 9987*  --   --   TROPONINI  --   --  0.28* 0.33* 0.24*   BNP: Invalid input(s): POCBNP D-Dimer: No results for input(s): DDIMER in the last  72 hours. Hemoglobin A1C: No results for input(s): HGBA1C in the last 72 hours. Fasting Lipid Panel:  Recent Labs  04/12/15 0223  CHOL 80  HDL 32*  LDLCALC 37  TRIG 53  CHOLHDL 2.5   Thyroid Function Tests:  Recent Labs  04/12/15 0620  TSH 0.569   Anemia Panel: No results for input(s): VITAMINB12, FOLATE, FERRITIN, TIBC, IRON, RETICCTPCT in the last 72 hours. Coag Panel:   Lab Results  Component Value Date   INR 0.9 11/09/2008   INR 1.0 04/12/2008   INR 0.9 04/11/2008    RADIOLOGY: Dg Chest Port 1 View  04/13/2015   CLINICAL DATA:  Shortness of breath.  History of COPD.  EXAM: PORTABLE CHEST - 1 VIEW  COMPARISON:  None.  FINDINGS: Stable COPD. Slight increase in interstitial prominence since the prior study may be consistent with a component of early interstitial edema. No focal airspace consolidation, pleural fluid or pneumothorax is identified. The heart size and mediastinal contours are within normal limits.  IMPRESSION: Stable COPD with slight increased interstitial prominence. This may be consistent with early interstitial edema.   Electronically Signed   By: Aletta Edouard M.D.   On: 04/13/2015 07:52  Dg Chest Port 1 View  04/12/2015   CLINICAL DATA:  Cough.  EXAM: PORTABLE CHEST - 1 VIEW  COMPARISON:  04/09/2015  FINDINGS: The cardiomediastinal contours are unchanged. Mild hyperinflation and central bronchitic change. Pulmonary vasculature is normal. No consolidation, pleural effusion, or pneumothorax. No acute osseous abnormalities are seen.  IMPRESSION: Chronic hyperinflation and central bronchitic change. No superimposed acute process.   Electronically Signed   By: Jeb Levering M.D.   On: 04/12/2015 06:47   Dg Chest Port 1 View  04/09/2015   CLINICAL DATA:  Right calf red and swollen, severe pain  EXAM: PORTABLE CHEST - 1 VIEW  COMPARISON:  08/10/2010  FINDINGS: The heart size and mediastinal contours are within normal limits. Both lungs are clear. The visualized  skeletal structures are unremarkable.  IMPRESSION: No active disease.   Electronically Signed   By: Skipper Cliche M.D.   On: 04/09/2015 17:16      ASSESSMENT: [name]:    SVT: Based on the fact that there are abrupt starts and stops of this tachycardia, I do not think it is sinus tachycardia.   Still awaiting echo results. We were able to break the tachycardia with a Valsalva maneuver while I was in the room. She returned back to normal sinus rhythm with a heart rate in the 80s. She otherwise appears stable from a cardiovascular standpoint.  Consider a low-dose beta blocker when stable from a medical standpoint.  Jettie Booze, MD  04/13/2015  12:41 PM

## 2015-04-13 NOTE — Progress Notes (Addendum)
Pt received a frozen chicken dinner tray as a snack tonight.  She aet all her peaches and half the chicken and a little bit of the mixed vegetables and all the mashed potatoes.

## 2015-04-14 DIAGNOSIS — R931 Abnormal findings on diagnostic imaging of heart and coronary circulation: Secondary | ICD-10-CM

## 2015-04-14 LAB — GLUCOSE, CAPILLARY
GLUCOSE-CAPILLARY: 75 mg/dL (ref 65–99)
Glucose-Capillary: 97 mg/dL (ref 65–99)
Glucose-Capillary: 112 mg/dL — ABNORMAL HIGH (ref 65–99)
Glucose-Capillary: 127 mg/dL — ABNORMAL HIGH (ref 65–99)

## 2015-04-14 LAB — CBC
HCT: 35.1 % — ABNORMAL LOW (ref 36.0–46.0)
HEMOGLOBIN: 11.4 g/dL — AB (ref 12.0–15.0)
MCH: 31.3 pg (ref 26.0–34.0)
MCHC: 32.5 g/dL (ref 30.0–36.0)
MCV: 96.4 fL (ref 78.0–100.0)
Platelets: 164 10*3/uL (ref 150–400)
RBC: 3.64 MIL/uL — AB (ref 3.87–5.11)
RDW: 13.7 % (ref 11.5–15.5)
WBC: 9.4 10*3/uL (ref 4.0–10.5)

## 2015-04-14 LAB — BASIC METABOLIC PANEL
Anion gap: 5 (ref 5–15)
BUN: 10 mg/dL (ref 6–20)
CHLORIDE: 105 mmol/L (ref 101–111)
CO2: 25 mmol/L (ref 22–32)
Calcium: 8.4 mg/dL — ABNORMAL LOW (ref 8.9–10.3)
Creatinine, Ser: 0.93 mg/dL (ref 0.44–1.00)
GFR calc Af Amer: >60 mL/min (ref >60)
GFR calc non Af Amer: >60 mL/min (ref >60)
GLUCOSE: 94 mg/dL (ref 65–99)
POTASSIUM: 3.8 mmol/L (ref 3.5–5.1)
SODIUM: 135 mmol/L (ref 135–145)

## 2015-04-14 LAB — PROCALCITONIN

## 2015-04-14 NOTE — Progress Notes (Signed)
Patient Demographics:    [name], is a 64 y.o. female, DOB - June 14, 1951, GMW:[id number]  Admit date - 04/09/2015   Admitting Physician Costin Karlyne Greenspan, MD  Outpatient Primary MD for the patient is Maricela Curet, MD  LOS - 5   Chief Complaint  Patient presents with  . Foot Pain        Subjective:    [name] today has, No headache, No chest pain, No abdominal pain - No Nausea, No new weakness tingling or numbness, No Cough - SOB. Minimal right lower extremity pain much better than before.   Assessment  & Plan :    1. 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 Ask neurosurgery on board, status post Thrombectomy of tibial arteries, Vein patch right popliteal artery, Right femoral to below knee popliteal bypass with Propaten, angiogram by Dr. Trula Slade on 04/10/2015, right leg appears more warm with improving sensation, continues to have some weakness. Further management per vascular surgery for this problem. Continue aspirin and statin.   2. COPD. At baseline no acute issues, no wheezing on exam, supportive care with oxygen & Neb treatments as needed.   3. Essential hypertension - blood pressure low, on low dose beta blocker as tolerated.   4. Smoking. Counseled to quit   5. Depression. Continue Celexa.   6. GERD. On PPI continue.   7. Hyponatremia. Improving  with hydration.   8. Hypotension and tachycardia. Per cardiology could be SVT. No chest pain or shortness of breath. Has been adequately hydrated and she is over 16 L net positive, will stop IV fluids, if blood pressure stays stable will gently diurese, Tachycardia much improved, TSH, echo stable, on low-dose beta blocker will advance if blood pressure tolerates.   9. Rhabdomyolysis due to #1 above.  Hydrate and monitor. CK levels falling.   10. Mild non-ACS turned troponin rise. Due to nonstop tachycardia, on aspirin and statin, beta blocker as tolerated. No chest pain, EKG nonacute, non acute Echo.   11. GERD. On PPI.     Code Status : Full  Family Communication  : None present  Disposition Plan  : To be decided  Consults  :  Vascular surgeon Dr. Trula Slade  Procedures  :    Right leg venous duplex negative ultrasound  Right leg arteriogram per vascular surgery  Right -Thrombectomy of tibial arteries, Vein patch right popliteal artery, Right femoral to below knee popliteal bypass with Propaten, angiogram - 04-10-15  TTE  Impressions:  - Technically difficult study. Very poor images even with Definityuse. There probably was mildly decreased LV systolic function, YQ03-47%. Possible septal hypokinesis though the wall was notwell-visualized. Mildly dilated RV with mildly decreased systolicfunction. Dilated IVC suggests elevated RV filling pressure.   DVT Prophylaxis  :   Lovenox   Lab Results  Component Value Date   PLT 164 04/14/2015    Inpatient Medications  Scheduled Meds: . antiseptic oral rinse  7 mL Mouth Rinse BID  . aspirin  325 mg Oral Daily  . atorvastatin  20 mg Oral q1800  . citalopram  20 mg Oral Daily  . docusate sodium  100 mg Oral Daily  . enoxaparin (LOVENOX) injection  40 mg Subcutaneous Q24H  . insulin aspart  0-9 Units Subcutaneous TID WC  . insulin glargine  18 Units Subcutaneous QHS  . metoprolol tartrate  12.5 mg Oral BID  . pantoprazole  40 mg Oral Daily   Continuous Infusions:   PRN Meds:.acetaminophen, ALPRAZolam, alum & mag hydroxide-simeth, bisacodyl, guaiFENesin-dextromethorphan, hydrALAZINE, levalbuterol, magnesium sulfate 1 - 4 g bolus IVPB, morphine injection, ondansetron (ZOFRAN) IV, oxyCODONE-acetaminophen, phenol, potassium chloride, senna-docusate, sodium chloride  Antibiotics  :    Anti-infectives    Start      Dose/Rate Route Frequency Ordered Stop   04/10/15 2200  vancomycin (VANCOCIN) IVPB 1000 mg/200 mL premix     1,000 mg 200 mL/hr over 60 Minutes Intravenous Every 12 hours 04/10/15 2020 04/11/15 1136   04/10/15 0030  cephALEXin (KEFLEX) capsule 500 mg  Status:  Discontinued     500 mg Oral 3 times daily before meals & bedtime 04/09/15 2345 04/09/15 2347   04/10/15 0030  cephALEXin (KEFLEX) capsule 500 mg     500 mg Oral 3 times daily before meals & bedtime 04/09/15 2347 04/13/15 2359        Objective:   Filed Vitals:   04/13/15 1719 04/13/15 2140 04/14/15 0518 04/14/15 1049  BP: 127/69 117/56 98/60 113/54  Pulse: 80 70 85 74  Temp: 98.4 F (36.9 C) 97.6 F (36.4 C) 98.3 F (36.8 C) 99.1 F (37.3 C)  TempSrc: Oral Oral Oral Oral  Resp: 18 18 18 18   Height:      Weight:      SpO2: 100% 95% 97% 97%    Wt Readings from Last 3 Encounters:  04/09/15 76.3 kg (168 lb 3.4 oz)  12/26/13 66.67 kg (146 lb 15.7 oz)  08/05/13 73.029 kg (161 lb)     Intake/Output Summary (Last 24 hours) at 04/14/15 1055 Last data filed at 04/14/15 0825  Gross per 24 hour  Intake 9410.84 ml  Output    565 ml  Net 8845.84 ml     Physical Exam  Awake Alert, Oriented X 3, No new F.N deficits, Normal affect [place].AT,PERRAL Supple Neck,No JVD, No cervical lymphadenopathy appriciated.  Symmetrical Chest wall movement, Good air movement bilaterally, CTAB RRR,No Gallops,Rubs or new Murmurs, No Parasternal Heave +ve B.Sounds, Abd Soft, No tenderness, No organomegaly appriciated, No rebound - guarding or rigidity. No Cyanosis, Clubbing or edema, No new Rash or bruise, right leg warm to touch, improving sensation in toes. Right leg postop drain and posterior foot splint in place.     Data Review:   Micro Results Recent Results (from the past 240 hour(s))  MRSA PCR Screening     Status: None   Collection Time: 04/09/15  8:29 PM  Result Value Ref Range Status   MRSA by PCR NEGATIVE NEGATIVE Final     Comment:        The GeneXpert MRSA Assay (FDA approved for NASAL specimens only), is one component of a comprehensive MRSA colonization surveillance program. It is not intended to diagnose MRSA infection nor to guide or monitor treatment for MRSA infections.   Urine culture     Status: None   Collection Time: 04/10/15  9:00 PM  Result Value Ref Range Status   Specimen Description URINE, CATHETERIZED  Final   Special Requests NONE  Final   Culture NO GROWTH 2 DAYS  Final   Report Status 04/12/2015 FINAL  Final    Radiology Reports Dg Chest Port 1 View  04/13/2015   CLINICAL DATA:  Shortness of breath.  History of COPD.  EXAM:  PORTABLE CHEST - 1 VIEW  COMPARISON:  None.  FINDINGS: Stable COPD. Slight increase in interstitial prominence since the prior study may be consistent with a component of early interstitial edema. No focal airspace consolidation, pleural fluid or pneumothorax is identified. The heart size and mediastinal contours are within normal limits.  IMPRESSION: Stable COPD with slight increased interstitial prominence. This may be consistent with early interstitial edema.   Electronically Signed   By: Aletta Edouard M.D.   On: 04/13/2015 07:52   Dg Chest Port 1 View  04/12/2015   CLINICAL DATA:  Cough.  EXAM: PORTABLE CHEST - 1 VIEW  COMPARISON:  04/09/2015  FINDINGS: The cardiomediastinal contours are unchanged. Mild hyperinflation and central bronchitic change. Pulmonary vasculature is normal. No consolidation, pleural effusion, or pneumothorax. No acute osseous abnormalities are seen.  IMPRESSION: Chronic hyperinflation and central bronchitic change. No superimposed acute process.   Electronically Signed   By: Jeb Levering M.D.   On: 04/12/2015 06:47   Dg Chest Port 1 View  04/09/2015   CLINICAL DATA:  Right calf red and swollen, severe pain  EXAM: PORTABLE CHEST - 1 VIEW  COMPARISON:  08/10/2010  FINDINGS: The heart size and mediastinal contours are within normal  limits. Both lungs are clear. The visualized skeletal structures are unremarkable.  IMPRESSION: No active disease.   Electronically Signed   By: Skipper Cliche M.D.   On: 04/09/2015 17:16     CBC  Recent Labs Lab 04/09/15 1535 04/10/15 0012 04/11/15 0250 04/12/15 0223 04/13/15 0250 04/14/15 0515  WBC 14.4* 10.6* 14.3* 12.0* 9.5 9.4  HGB 19.1* 17.9* 13.4 12.5 11.2* 11.4*  HCT 54.6* 52.4* 41.1 38.6 33.8* 35.1*  PLT 249 214 160 139* 128* 164  MCV 93.3 94.2 96.5 95.3 95.5 96.4  MCH 32.6 32.2 31.5 30.9 31.6 31.3  MCHC 35.0 34.2 32.6 32.4 33.1 32.5  RDW 13.5 13.7 13.6 13.8 13.7 13.7  LYMPHSABS 3.0  --   --   --   --   --   MONOABS 1.4*  --   --   --   --   --   EOSABS 0.0  --   --   --   --   --   BASOSABS 0.0  --   --   --   --   --     Chemistries   Recent Labs Lab 04/10/15 0012 04/11/15 0250 04/12/15 0129 04/13/15 0250 04/14/15 0515  NA 126* 131* 132* 135 135  K 4.6 5.0 4.4 4.0 3.8  CL 93* 98* 102 106 105  CO2 26 28 23 24 25   GLUCOSE 172* 117* 89 71 94  BUN 20 9 8 8 10   CREATININE 1.15* 0.89 0.78 0.80 0.93  CALCIUM 8.4* 8.2* 8.0* 8.3* 8.4*   ------------------------------------------------------------------------------------------------------------------ estimated creatinine clearance is 63.8 mL/min (by C-G formula based on Cr of 0.93). ------------------------------------------------------------------------------------------------------------------ No results for input(s): HGBA1C in the last 72 hours. ------------------------------------------------------------------------------------------------------------------  Recent Labs  04/12/15 0223  CHOL 80  HDL 32*  LDLCALC 37  TRIG 53  CHOLHDL 2.5   ------------------------------------------------------------------------------------------------------------------  Recent Labs  04/12/15 0620  TSH 0.569    ------------------------------------------------------------------------------------------------------------------ No results for input(s): VITAMINB12, FOLATE, FERRITIN, TIBC, IRON, RETICCTPCT in the last 72 hours.  Coagulation profile No results for input(s): INR, PROTIME in the last 168 hours.  No results for input(s): DDIMER in the last 72 hours.  Cardiac Enzymes  Recent Labs Lab 04/12/15 1025 04/12/15 1607 04/12/15 2250  TROPONINI 0.28* 0.33* 0.24*   ------------------------------------------------------------------------------------------------------------------  Invalid input(s): POCBNP    Results for ARGIE, LOBER (MRN [id number]) as of 04/14/2015 10:55  Ref. Range 04/09/2015 19:24 04/11/2015 11:03 04/12/2015 01:29 04/12/2015 10:25  CK Total Latest Ref Range: 38-234 U/L 10787 (H) 13544 (H) 11977 (H) 9987 (H)    Time Spent in minutes   30   Sherrol Vicars K M.D on 04/14/2015 at 10:55 AM  Between 7am to 7pm - Pager - [phone]  After 7pm go to [email or web address] - password [facility]  [facility] -  Office  [phone]

## 2015-04-14 NOTE — Progress Notes (Signed)
Wound vac drained out 87ml of fluids.

## 2015-04-14 NOTE — Progress Notes (Signed)
      I placed a posterior splint on her right foot and ankle to use when she is at rest to maintain 90 degrees of ankle flexion while she recovers.  The AFO is for ambulation when she is walking.  Ammy Lienhard MAUREEN PA-C

## 2015-04-14 NOTE — Progress Notes (Signed)
Physical Therapy Treatment Patient Details Name: [name] MRN: [id number] DOB: [birth year] Today's Date: 04/14/2015    History of Present Illness pt is a 64 y/o female admitted with R foot pain s/p Thrombectomy of tibial arteries, Vein patch right popliteal artery, Right femoral to below knee popliteal bypass with Propaten, angiogram.    PT Comments    Pt progressing towards physical therapy goals. She reports increased pain and fatigue, and session focused on LE strengthening in bed. RN was notified that pt requesting pain meds. Splint donned throughout session and is ACE wrapped. Discussed plan with OT and reinforced with pt to wear this splint with XXL blue sock over it for transfers, and to take off and don AFO for more extensive ambulation. Will continue to follow and progress as able per POC.   Follow Up Recommendations  Home health PT;Supervision for mobility/OOB     Equipment Recommendations  None recommended by PT    Recommendations for Other Services       Precautions / Restrictions Precautions Precautions: Fall Restrictions Weight Bearing Restrictions: No    Mobility  Bed Mobility               General bed mobility comments: Pt was able to scoot to [facility] and position herself in the center of the bed with use of bed rails, but without physical assistance.  Transfers                 General transfer comment: Pt in increased pain and states she is very fatigued from earlier OT session. Pt declines OOB at this time.   Ambulation/Gait                 Stairs            Wheelchair Mobility    Modified Rankin (Stroke Patients Only)       Balance Overall balance assessment: Needs assistance                                  Cognition Arousal/Alertness: Awake/alert Behavior During Therapy: WFL for tasks assessed/performed Overall Cognitive Status: Within Functional Limits for tasks assessed                       Exercises General Exercises - Lower Extremity Ankle Circles/Pumps: 10 reps Quad Sets: 10 reps Gluteal Sets: 10 reps Short Arc Quad: 10 reps Hip ABduction/ADduction: 10 reps Straight Leg Raises: 10 reps Low Level/ICU Exercises Stabilized Bridging: 5 reps    General Comments        Pertinent Vitals/Pain Pain Assessment: Faces Faces Pain Scale: Hurts even more Pain Location: RLE Pain Descriptors / Indicators: Aching;Sore Pain Intervention(s): Limited activity within patient's tolerance;Monitored during session;Repositioned;Patient requesting pain meds-RN notified    Home Living                      Prior Function            PT Goals (current goals can now be found in the care plan section) Acute Rehab PT Goals Patient Stated Goal: to go home  PT Goal Formulation: With patient Time For Goal Achievement: 04/19/15 Potential to Achieve Goals: Good Progress towards PT goals: Progressing toward goals    Frequency  Min 3X/week    PT Plan Discharge plan needs to be updated    Co-evaluation  End of Session   Activity Tolerance: Patient limited by pain;Patient limited by fatigue Patient left: in bed;with call bell/phone within reach     Time: 7530-0511 PT Time Calculation (min) (ACUTE ONLY): 18 min  Charges:  $Therapeutic Exercise: 8-22 mins                    G Codes:      Rolinda Roan April 23, 2015, 2:44 PM   Rolinda Roan, PT, DPT Acute Rehabilitation Services Pager: [phone]

## 2015-04-14 NOTE — Consult Note (Addendum)
WOC wound consult note Reason for Consult: Consult requested to change Vac dressing and obtain measurements for home Vac approval process.  VVS following for assessment and plan of care. Wound type: Full thickness post-op wound to right outer calf. Measurement: 14X6X.3cm Wound bed: Beefy red with exposed muscles and tendons. No tunneling or undermining. Drainage (amount, consistency, odor) Mod amt yellow drainage in cannister, no odor Periwound: Generalized edema and erythremia surrounding wound. Dressing procedure/placement/frequency: Pt medicated for pain prior to procedure but still had mod amt discomfort during dressing change. Applied Mepitel contact layer to minimize discomfort with next dressing change, then one piece black sponge to 100 mm cont suction. Re-applied splint and ace wrap over RLE.  Plan for bedside nurse to change Q M/W/F. Discussed plan of care with patient and she verbalized understanding. Please re-consult if further assistance is needed.  Thank-you,  Julien Girt MSN, [place], [place], [place], [place]

## 2015-04-14 NOTE — Progress Notes (Signed)
CSW received consult for SNF placement if pt is unable to be accepted to CIR.  PT/OT recommended no follow up.  CSW spoke with RNCM who states plan is to return home- pt will have 24 hour assistance at time of DC.  CSW signing off.  Domenica Reamer, [place] Social Worker [phone]

## 2015-04-14 NOTE — Progress Notes (Signed)
Patient Name: [name] Date of Encounter: 04/14/2015  Active Problems:   Tachycardia   Dehydration   COPD (chronic obstructive pulmonary disease)   Essential hypertension, benign   Diabetes   Lower limb ischemia   Critical lower limb ischemia   AKI (acute kidney injury)   Hyperkalemia   SVT (supraventricular tachycardia)   Primary Cardiologist: Dr Acie Fredrickson  Patient Profile: 64 yo female w/ hx HTN, COPD, HL, tob abuse, admitted 07/24 for RLE pain, had tibial thrombectomy, R fem-pop BPG 07/25. Cards saw 07/27 for tachycardia.  SUBJECTIVE: Pt denies sx from episode tachycardia yest pm. Says will not smoke again  OBJECTIVE Filed Vitals:   04/13/15 1532 04/13/15 1719 04/13/15 2140 04/14/15 0518  BP: 106/57 127/69 117/56 98/60  Pulse: 78 80 70 85  Temp: 98.4 F (36.9 C) 98.4 F (36.9 C) 97.6 F (36.4 C) 98.3 F (36.8 C)  TempSrc: Oral Oral Oral Oral  Resp: 15 18 18 18   Height:      Weight:      SpO2: 94% 100% 95% 97%    Intake/Output Summary (Last 24 hours) at 04/14/15 0957 Last data filed at 04/14/15 0825  Gross per 24 hour  Intake 9290.84 ml  Output    565 ml  Net 8725.84 ml   Filed Weights   04/09/15 1454 04/09/15 1706 04/09/15 2020  Weight: 170 lb (77.111 kg) 170 lb (77.111 kg) 168 lb 3.4 oz (76.3 kg)    PHYSICAL EXAM General: Well developed, well nourished, female in no acute distress. Head: Normocephalic, atraumatic.  Neck: Supple without bruits, JVD not elevated. Lungs:  Resp regular and unlabored, rales bases, improves w/ cough. Heart: RRR, S1, S2, no S3, S4, or murmur; no rub. Abdomen: Soft, non-tender, non-distended, BS + x 4.  Extremities: No clubbing, cyanosis, edema LLE. RLE incision healing well Neuro: Alert and oriented X 3. Moves all extremities spontaneously. Psych: Normal affect.  LABS: CBC: Recent Labs  04/13/15 0250 04/14/15 0515  WBC 9.5 9.4  HGB 11.2* 11.4*  HCT 33.8* 35.1*  MCV 95.5 96.4  PLT 128* 250   Basic  Metabolic Panel: Recent Labs  04/13/15 0250 04/14/15 0515  NA 135 135  K 4.0 3.8  CL 106 105  CO2 24 25  GLUCOSE 71 94  BUN 8 10  CREATININE 0.80 0.93  CALCIUM 8.3* 8.4*    Cardiac Enzymes: Recent Labs  04/11/15 1103 04/12/15 0129 04/12/15 1025 04/12/15 1607 04/12/15 2250  CKTOTAL 03704* 11977* 9987*  --   --   TROPONINI  --   --  0.28* 0.33* 0.24*   Fasting Lipid Panel: Recent Labs  04/12/15 0223  CHOL 80  HDL 32*  LDLCALC 37  TRIG 53  CHOLHDL 2.5   Thyroid Function Tests: Recent Labs  04/12/15 0620  TSH 0.569   TELE:  SR, episode SVT last pm, lasting > 2 hr       ECHO: 04/13/2015 - Left ventricle: The cavity size was normal. Wall thickness was normal. Systolic function was mildly reduced. The estimated ejection fraction was in the range of 45% to 50%. ?Septal hypokinesis (but difficult windows). Features are consistent with a pseudonormal left ventricular filling pattern, with concomitant abnormal relaxation and increased filling pressure (grade 2 diastolic dysfunction). - Aortic valve: Poorly visualized. There was no stenosis. - Mitral valve: Mildly calcified annulus. There was no significant regurgitation. - Left atrium: The atrium was mildly dilated. - Right ventricle: The cavity size was mildly dilated. Systolic function  was mildly reduced. - Right atrium: The atrium was moderately dilated. - Pulmonary arteries: No complete TR doppler jet so unable to estimate PA systolic pressure. - Systemic veins: IVC measured 2.3 cm with < 50% respirophasic variation, suggesting RA pressure 15 mmHg. Impressions: - Technically difficult study. Very poor images even with Definity use. There probably was mildly decreased LV systolic function, EF 43-32%. Possible septal hypokinesis though the wall was not well-visualized. Mildly dilated RV with mildly decreased systolic function. Dilated IVC suggests elevated RV filling  pressure.  Radiology/Studies: Dg Chest Port 1 View  04/13/2015   CLINICAL DATA:  Shortness of breath.  History of COPD.  EXAM: PORTABLE CHEST - 1 VIEW  COMPARISON:  None.  FINDINGS: Stable COPD. Slight increase in interstitial prominence since the prior study may be consistent with a component of early interstitial edema. No focal airspace consolidation, pleural fluid or pneumothorax is identified. The heart size and mediastinal contours are within normal limits.  IMPRESSION: Stable COPD with slight increased interstitial prominence. This may be consistent with early interstitial edema.   Electronically Signed   By: Aletta Edouard M.D.   On: 04/13/2015 07:52    Current Medications:  . antiseptic oral rinse  7 mL Mouth Rinse BID  . aspirin  325 mg Oral Daily  . atorvastatin  20 mg Oral q1800  . citalopram  20 mg Oral Daily  . docusate sodium  100 mg Oral Daily  . enoxaparin (LOVENOX) injection  40 mg Subcutaneous Q24H  . insulin aspart  0-9 Units Subcutaneous TID WC  . insulin glargine  18 Units Subcutaneous QHS  . metoprolol tartrate  12.5 mg Oral BID  . pantoprazole  40 mg Oral Daily      ASSESSMENT AND PLAN: SVT: Based on the fact that there are abrupt starts and stops of this tachycardia, I do not think it is sinus tachycardia. Still awaiting echo results. We were able to break the tachycardia with a Valsalva maneuver while I was in the room. She returned back to normal sinus rhythm with a heart rate in the 80s. She otherwise appears stable from a cardiovascular standpoint.  Tolerating a low-dose beta blocker.  Elevated troponin - feel this is related to her very high CKMB - MD review echo and advise if further eval needed  VVS has cleared pt for d/c, MD advise. Otherwise, per IM/VVS   Active Problems:   Tachycardia   Dehydration   COPD (chronic obstructive pulmonary disease)   Essential hypertension, benign   Diabetes   Lower limb ischemia   Critical lower limb  ischemia   AKI (acute kidney injury)   Hyperkalemia   SVT (supraventricular tachycardia)   Signed, Barrett, Rhonda , PA-C 9:57 AM 04/14/2015  I have examined the patient and reviewed assessment and plan and discussed with patient.  Agree with above as stated.  No further evaluation needed at this time of her echo.  WOuld schedule f/u with Dr. Acie Fredrickson for her SVT.  Continue to treat with low dose beta blocker.  Reminded patient of Valsalva maneuver.    Kerryann Allaire S.

## 2015-04-14 NOTE — Progress Notes (Addendum)
[facility]  Subjective  - Doing well over all.     Objective 98/60 85 98.3 F (36.8 C) (Oral) 18 97%  Intake/Output Summary (Last 24 hours) at 04/14/15 0835 Last data filed at 04/14/15 0825  Gross per 24 hour  Intake 9655.84 ml  Output    565 ml  Net 9090.84 ml   Right lateral leg wound vac in place Sensation improving slowly to dorsum of foot/anterior ankle Heart tachy/sinus  Lungs non labored breathing   Assessment/Planning: POD # 4 Thrombectomy of tibial arteries, Vein patch right popliteal artery, Right femoral to below knee popliteal bypass with Propaten, angiogram. Muscle viable continue wound vac, foot drop AFO for ambulation, placed CIR consult today Pending ECO results no CP SOB Asymptomatic tachy cardia/sinus rhythm hypotension low dose BB as tolerates   [name], [name] 04/14/2015 8:35 AM -- Ready for d/c from our standpoint VAC for home  Patent bypass Will need follow up in 2 weeks  Ruta Hinds, MD [facility] [place]: [phone] Pager: [phone]  Laboratory Lab Results:  Recent Labs  04/13/15 0250 04/14/15 0515  WBC 9.5 9.4  HGB 11.2* 11.4*  HCT 33.8* 35.1*  PLT 128* 164   BMET  Recent Labs  04/13/15 0250 04/14/15 0515  NA 135 135  K 4.0 3.8  CL 106 105  CO2 24 25  GLUCOSE 71 94  BUN 8 10  CREATININE 0.80 0.93  CALCIUM 8.3* 8.4*    COAG Lab Results  Component Value Date   INR 0.9 11/09/2008   INR 1.0 04/12/2008   INR 0.9 04/11/2008   No results found for: PTT

## 2015-04-14 NOTE — Care Management Note (Addendum)
Case Management Note  Patient Details  Name: [name] MRN: [id number] Date of Birth: [birth year]  Subjective/Objective:   Pt from home with husband, independent with ADL's prior to admission. Admitted with R ischemic foot, s/p Thrombectomy of tibial arteries, Vein patch right popliteal artery, Right femoral to below knee popliteal bypass, wound vac to R calf area.  Action/Plan: Return to home when medically stable. CM to f/u with d/c disposition.  Expected Discharge Date:                  Expected Discharge Plan:  [place]  In-House Referral:     Discharge planning Services     Post Acute Care Choice:  Home Health Choice offered to:  Patient  DME Arranged:  Negative pressure wound device DME Agency:  [place]:  RN [facility] Agency:  [facility]  Status of Service:  In process, will continue to follow  Medicare Important Message Given:  Yes-third notification given Date Medicare IM Given:    Medicare IM give by:    Date Additional Medicare IM Given:    Additional Medicare Important Message give by:     If discussed at [place] of Stay Meetings, dates discussed:    AdditionaCecil Glover (Spouse) [phone] , [name](Sister)  [phone] l Comments: 04/14/15 Elenor Quinones, RN, BSN [phone] Application is now complete, hard copy has been given to [facility].  CM assessed pt, pt is from home with husband.  Husband at bedside, pt and spouse are comfortable with going home with wound vac services by [facility].  Pt is not CIR appropriate.  CM contacted [facility] and asked for review of wound vac order to make sure referral will be accepted, resource determined agency could provide care as ordered.  CM contacted MD Oneida Alar; MD is in agreement for [facility] to provide wound vac, pt will go home with wet to dry dressing and agency RN will hook up agency wound vac day of  discharge at the home of the pt, agency informed of pending discharge tomorrow 04/15/15.  CM consulted [facility] nurse to provide necessary documentation required for application.  CM contacted both [facility] and DME, both referral were accepted, service will begin day of discharge.  CM will continue to monitor for disposition plan.     Maryclare Labrador, RN 04/14/2015, 12:03 PM

## 2015-04-14 NOTE — Progress Notes (Signed)
Thank you for consult on [name]. Chart reviewed and note that she did well on PT/OT evaluations . No PT/OT follow up recommended at this time. Will defer CIR consult for now.

## 2015-04-14 NOTE — Progress Notes (Addendum)
Occupational Therapy Treatment Patient Details Name: [name] MRN: [id number] DOB: [birth year] Today's Date: 04/14/2015    History of present illness pt is a 64 y/o female admitted with R foot pain s/p Thrombectomy of tibial arteries, Vein patch right popliteal artery, Right femoral to below knee popliteal bypass with Propaten, angiogram.   OT comments  Patient progressing towards OT goals, continue plan of care for now. PA Laurence Slate discussed use of new posterior splint > right foot and ankle. Kept splint donned during functional ambulation<>BR, donning XXL blue sock over splint as a falls prevention. Pt aware to wear AFO for longer distance ambualtion. Husband present during session and states that he can provide 24/7 supervision/assistance post discharge.    Follow Up Recommendations  No OT follow up;Supervision/Assistance - 24 hour    Equipment Recommendations  None recommended by OT    Recommendations for Other Services  None at this time  Precautions / Restrictions Precautions Precautions: Fall Restrictions Weight Bearing Restrictions: No    Mobility Bed Mobility General bed mobility comments: Pt found seated in recliner  Transfers Overall transfer level: Needs assistance Equipment used: Rolling walker (2 wheeled) Transfers: Sit to/from Stand Sit to Stand: Mod assist  General transfer comment: Mod assist to power up from recliner, cues for hand placement. Min assist to power up from [facility] over toilet seat using grab bars and RW.     Balance Overall balance assessment: Needs assistance Sitting-balance support: No upper extremity supported;Feet supported Sitting balance-Leahy Scale: Good     Standing balance support: Bilateral upper extremity supported;During functional activity Standing balance-Leahy Scale: Fair   ADL Overall ADL's : Needs assistance/impaired General ADL Comments: Pt continues to require increased assistance with ADLs and functional mobility due to  wound vac and immobility of RLE. Pt's husband present during OT session and states that he will be available 24/7 along with other family. Pt ambulated recliner <> BR for toilet transfer using 3-in-1 over toilet seat. Encouraged patient to ambulate <> BR for toileting needs to help increase strength/endurance/independence. Encouraged patient to initially take sponge baths at home due to wound vac and decreased dynamic standing balance.      Cognition   Behavior During Therapy: WFL for tasks assessed/performed Overall Cognitive Status: Within Functional Limits for tasks assessed                 Pertinent Vitals/ Pain       Pain Assessment: Faces Faces Pain Scale: Hurts little more Pain Location: RLE Pain Descriptors / Indicators: Aching;Sore Pain Intervention(s): Monitored during session         Frequency Min 2X/week     Progress Toward Goals  OT Goals(current goals can now be found in the care plan section)  Progress towards OT goals: Progressing toward goals     Plan Discharge plan remains appropriate    End of Session Equipment Utilized During Treatment: Gait belt;Rolling walker (fabricated splint > RLE)   Activity Tolerance Patient tolerated treatment well   Patient Left in chair;with call bell/phone within reach;with family/visitor present    Time: 1660-6301 OT Time Calculation (min): 30 min  Charges: OT General Charges $OT Visit: 1 Procedure OT Treatments $Self Care/Home Management : 23-37 mins  Vishruth Seoane , MS, OTR/L, CLT Pager: [phone]  04/14/2015, 10:25 AM

## 2015-04-15 DIAGNOSIS — I771 Stricture of artery: Secondary | ICD-10-CM

## 2015-04-15 LAB — CBC
HCT: 32.8 % — ABNORMAL LOW (ref 36.0–46.0)
HEMOGLOBIN: 10.5 g/dL — AB (ref 12.0–15.0)
MCH: 30.4 pg (ref 26.0–34.0)
MCHC: 32 g/dL (ref 30.0–36.0)
MCV: 95.1 fL (ref 78.0–100.0)
Platelets: 181 10*3/uL (ref 150–400)
RBC: 3.45 MIL/uL — ABNORMAL LOW (ref 3.87–5.11)
RDW: 13.8 % (ref 11.5–15.5)
WBC: 9.9 10*3/uL (ref 4.0–10.5)

## 2015-04-15 LAB — BASIC METABOLIC PANEL
Anion gap: 3 — ABNORMAL LOW (ref 5–15)
BUN: 12 mg/dL (ref 6–20)
CHLORIDE: 106 mmol/L (ref 101–111)
CO2: 26 mmol/L (ref 22–32)
Calcium: 8.5 mg/dL — ABNORMAL LOW (ref 8.9–10.3)
Creatinine, Ser: 0.94 mg/dL (ref 0.44–1.00)
GFR calc non Af Amer: >60 mL/min (ref >60)
Glucose, Bld: 126 mg/dL — ABNORMAL HIGH (ref 65–99)
Potassium: 3.9 mmol/L (ref 3.5–5.1)
SODIUM: 135 mmol/L (ref 135–145)

## 2015-04-15 LAB — GLUCOSE, CAPILLARY
Glucose-Capillary: 86 mg/dL (ref 65–99)
Glucose-Capillary: 66 mg/dL (ref 65–99)
Glucose-Capillary: 95 mg/dL (ref 65–99)

## 2015-04-15 MED ORDER — METOPROLOL TARTRATE 25 MG PO TABS: 12.5000 mg | ORAL_TABLET | Freq: Two times a day (BID) | ORAL | Status: DC

## 2015-04-15 MED ORDER — ATORVASTATIN CALCIUM 20 MG PO TABS: 20.0000 mg | ORAL_TABLET | Freq: Every day | ORAL | Status: DC

## 2015-04-15 MED ORDER — ASPIRIN 325 MG PO TABS: 325.0000 mg | ORAL_TABLET | Freq: Every day | ORAL | Status: DC

## 2015-04-15 MED ORDER — FUROSEMIDE 40 MG PO TABS
40.0000 mg | ORAL_TABLET | Freq: Once | ORAL | Status: AC
  Administered 2015-04-15: 40 mg via ORAL
  Filled 2015-04-15: qty 1

## 2015-04-15 NOTE — Discharge Summary (Signed)
[name], is a 64 y.o. female  DOB [birth year]  MRN [id number].  Admission date:  04/09/2015  Admitting Physician  Caren Griffins, MD  Discharge Date:  04/15/2015   Primary MD  DONDIEGO,RICHARD Jerilynn Mages, MD  Recommendations for primary care physician for things to follow:   Check CBC, CMP, blood pressure, heart rate in glycemic control next visit.  Outpatient vascular surgery and cardiology follow-up   Admission Diagnosis  Hyperkalemia [E87.5] Tachycardia [R00.0] Ischemic leg [I99.8] Pain of left lower extremity [M79.605]   Discharge Diagnosis  Hyperkalemia [E87.5] Tachycardia [R00.0] Ischemic leg [I99.8] Pain of left lower extremity [M79.605]     Active Problems:   Tachycardia   Dehydration   COPD (chronic obstructive pulmonary disease)   Essential hypertension, benign   Diabetes   Lower limb ischemia   Critical lower limb ischemia   AKI (acute kidney injury)   Hyperkalemia   SVT (supraventricular tachycardia)   Abnormal echocardiogram      Past Medical History  Diagnosis Date  . Hypertension   . Diabetes mellitus without complication   . COPD (chronic obstructive pulmonary disease)   . Stroke   . Chronic back pain     Past Surgical History  Procedure Laterality Date  . Back fusion    . Cholecystectomy    . Foot surgery Bilateral   . Cardiac catheterization    . Peripheral vascular catheterization N/A 04/10/2015    Procedure: Abdominal Aortogram w/Lower Extremity;  Surgeon: Conrad [place], MD;  Location: [facility];  Service: Cardiovascular;  Laterality: N/A;  . Thrombectomy femoral artery Right 04/10/2015    Procedure: THROMBECTOMY RIGHT TIBIAL  ARTERY WITH VEIN PATCH ANGIOPLASTY;  Surgeon: Elam Dutch, MD;  Location: [facility];  Service: Vascular;  Laterality: Right;  . Fasciotomy  Right 04/10/2015    Procedure: FOUR COMPARTMENT FASCIOTOMY;  Surgeon: Elam Dutch, MD;  Location: [facility] OR;  Service: Vascular;  Laterality: Right;  . Femoral-popliteal bypass graft Right 04/10/2015    Procedure: RIGHT  FEMORAL-BELOW THE KNEE POPLITEAL ARTERY BYPASS GRAFT USING 6 MM X 80 CM PROPATEN GRAFT;  Surgeon: Elam Dutch, MD;  Location: [facility];  Service: Vascular;  Laterality: Right;  . Intraoperative arteriogram Right 04/10/2015    Procedure: INTRA OPERATIVE ARTERIOGRAM;  Surgeon: Elam Dutch, MD;  Location: [facility] OR;  Service: Vascular;  Laterality: Right;       HPI  from the history and physical done on the day of admission:   [name] is a 64 y.o. female has a past medical history significant for hypertension, COPD, hyperlipidemia, tobacco abuse, presents to the emergency room with a chief complaint of right lower extremity pain for the past few days. Her pain actually started this Thursday night, it was in the calf area, he has progressively gotten worse and she was unable to ambulate anymore. She presented to [facility] DD, she was appreciated to have critical lower extremity ischemia with the absence of pulses, and was directed to [facility]  emergency room for vas per surgery consult. She was seen here by Dr. Trula Slade from vascular surgery, and will plan for angiography in the morning, and given renal failure and tachycardia hospitalist was asked to admit. Patient denies any chest pain, denies any palpitations, she denies any shortness of breath, she denies any cough or chest congestion. She denies any abdominal pain, nausea, vomiting or diarrhea. She denies any fever or chills. She continues to smoke. I she also endorses a poor appetite over the last couple of days due to severe right lower extremity pain. She endorses being on Keflex for a few days due to a tooth abscess, and this was started by her PCP Dr. Cindie Laroche. In the ED, she is afebrile, her blood pressure is normal, she is  however tachycardic to 120s, sinus rhythm. she was found to be mildly dehydrated with AKI, has a leukocytosis of 14, and she is hemoconcentrated with a hemoglobin of 19.       Hospital Course:     1. 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 Ask neurosurgery on board, status post Thrombectomy of tibial arteries, Vein patch right popliteal artery, Right femoral to below knee popliteal bypass with Propaten, angiogram by Dr. Donnetta Hutching on 04/10/2015, right leg appears more warm with improving sensation, continues to have some weakness. Continue aspirin and statin. Will go home with home health per vascular surgery, vascular surgery as ordered wound VAC for home. Home health nursing ordered. Any dressing changes per vascular surgery.   2. COPD. At baseline no acute issues, no wheezing on exam, supportive care with oxygen & Neb treatments as needed.   3. Essential hypertension - blood pressure low, on low dose beta blocker as tolerated.   4. Smoking. Counseled to quit   5. Depression. Continue Celexa.   6. GERD. On PPI continue.   7. Hyponatremia. Resolved with hydration.   8. Episodes of tachycardia. Per cardiology could be SVT. No chest pain or shortness of breath. Has been adequately hydrated and she is over 16 L net positive, episodes of tachycardia have now resolved, TSH, echo stable, on low-dose beta blocker request PCP to gently advance if blood pressure tolerates.   9. Rhabdomyolysis due to #1 above. Hydrate and monitor. CK levels falling.   10. Mild non-ACS turned troponin rise. Due to nonstop tachycardia, on aspirin and statin, beta blocker as tolerated. No chest pain, EKG nonacute, non acute Echo.   11. GERD. On PPI.   12. DM2 - home regimen continue, PCP to monitor  Lab Results  Component Value Date   HGBA1C 8.2* 04/09/2015    CBG (last 3)   Recent Labs  04/14/15 1628 04/14/15 2059 04/15/15 0650  GLUCAP 112* 127* 86          Discharge Condition: Stable  Follow UP  Follow-up Information    Follow up with Ruta Hinds, MD In 2 weeks.   Specialties:  Vascular Surgery, Cardiology   Why:  sent message to office   Contact information:   [place] [address] [phone]       Follow up with [facility].   Why:  Registered Nurse for wound vac care   Contact information:   [address] [place] [address] [phone]       Follow up with [facility].   Why:  Negative Pressure Wound Vac   Contact information:   [place] [address] [phone]  Follow up with Nahser, Wonda Cheng, MD.   Specialty:  Cardiology   Why:  The office will call.   Contact information:   [address] [place] [address] [phone]       Follow up with Maricela Curet, MD. Schedule an appointment as soon as possible for a visit in 1 week.   Specialty:  Internal Medicine   Contact information:   [place] [address] [phone]        Consults obtained - VVS, Cards  Diet and Activity recommendation: See Discharge Instructions below  Discharge Instructions       Discharge Instructions    Discharge instructions    Complete by:  As directed   Follow with Primary MD DONDIEGO,RICHARD Jerilynn Mages, MD in 7 days , Keep Right leg clean and dry at all times  Get CBC, CMP, 2 view Chest X ray checked  by Primary MD next visit.    Activity: As tolerated with Full fall precautions use walker/cane & assistance as needed   Disposition Home     Diet: Heart Healthy  Low Carb  Accuchecks 4 times/day, Once in AM empty stomach and then before each meal. Log in all results and show them to your Prim.MD in 3 days. If any glucose reading is under 80 or above 300 call your Prim MD immidiately. Follow Low glucose instructions for glucose under 80 as instructed.    For Heart failure patients - Check  your Weight same time everyday, if you gain over 2 pounds, or you develop in leg swelling, experience more shortness of breath or chest pain, call your Primary MD immediately. Follow Cardiac Low Salt Diet and 1.5 lit/day fluid restriction.   On your next visit with your primary care physician please Get Medicines reviewed and adjusted.   Please request your Prim.MD to go over all Hospital Tests and Procedure/Radiological results at the follow up, please get all Hospital records sent to your Prim MD by signing hospital release before you go home.   If you experience worsening of your admission symptoms, develop shortness of breath, life threatening emergency, suicidal or homicidal thoughts you must seek medical attention immediately by calling 911 or calling your MD immediately  if symptoms less severe.  You Must read complete instructions/literature along with all the possible adverse reactions/side effects for all the Medicines you take and that have been prescribed to you. Take any new Medicines after you have completely understood and accpet all the possible adverse reactions/side effects.   Do not drive, operating heavy machinery, perform activities at heights, swimming or participation in water activities or provide baby sitting services if your were admitted for syncope or siezures until you have seen by Primary MD or a Neurologist and advised to do so again.  Do not drive when taking Pain medications.    Do not take more than prescribed Pain, Sleep and Anxiety Medications  Special Instructions: If you have smoked or chewed Tobacco  in the last 2 yrs please stop smoking, stop any regular Alcohol  and or any Recreational drug use.  Wear Seat belts while driving.   Please note  You were cared for by a hospitalist during your hospital stay. If you have any questions about your discharge medications or the care you received while you were in the hospital after you are discharged, you can  call the unit and asked to speak with the hospitalist on call if the hospitalist that took care  of you is not available. Once you are discharged, your primary care physician will handle any further medical issues. Please note that NO REFILLS for any discharge medications will be authorized once you are discharged, as it is imperative that you return to your primary care physician (or establish a relationship with a primary care physician if you do not have one) for your aftercare needs so that they can reassess your need for medications and monitor your lab values.     Increase activity slowly    Complete by:  As directed              Discharge Medications       Medication List    STOP taking these medications        cephALEXin 500 MG capsule  Commonly known as:  KEFLEX      TAKE these medications        ALPRAZolam 0.25 MG tablet  Commonly known as:  XANAX  Take 0.25 mg by mouth 4 (four) times daily as needed for anxiety.     aspirin 325 MG tablet  Take 1 tablet (325 mg total) by mouth daily.     atorvastatin 20 MG tablet  Commonly known as:  LIPITOR  Take 1 tablet (20 mg total) by mouth daily at 6 PM.     citalopram 20 MG tablet  Commonly known as:  CELEXA  Take 20 mg by mouth daily.     LANTUS SOLOSTAR 100 UNIT/ML Solostar Pen  Generic drug:  Insulin Glargine  Inject 34 Units into the skin at bedtime.     lisinopril 20 MG tablet  Commonly known as:  PRINIVIL,ZESTRIL  Take 20 mg by mouth daily.     metFORMIN 500 MG tablet  Commonly known as:  GLUCOPHAGE  Take 500 mg by mouth 2 (two) times daily with a meal.     metoprolol tartrate 25 MG tablet  Commonly known as:  LOPRESSOR  Take 0.5 tablets (12.5 mg total) by mouth 2 (two) times daily.     naproxen 500 MG tablet  Commonly known as:  NAPROSYN  Take 500 mg by mouth 2 (two) times daily.     oxyCODONE-acetaminophen 5-325 MG per tablet  Commonly known as:  PERCOCET  Take 1 tablet by mouth every 8 (eight) hours  as needed for severe pain.     pantoprazole 40 MG tablet  Commonly known as:  PROTONIX  Take 1 tablet (40 mg total) by mouth daily at 12 noon.        Major procedures and Radiology Reports - PLEASE review detailed and final reports for all details, in brief -    Right leg venous duplex negative ultrasound  Right leg arteriogram per vascular surgery  Right -Thrombectomy of tibial arteries, Vein patch right popliteal artery, Right femoral to below knee popliteal bypass with Propaten, angiogram - 04-10-15 Dr Donnetta Hutching  TTE  Impressions:  - Technically difficult study. Very poor images even with Definityuse. There probably was mildly decreased LV systolic function, OJ50-09%. Possible septal hypokinesis though the wall was notwell-visualized. Mildly dilated RV with mildly decreased systolicfunction. Dilated IVC suggests elevated RV filling pressure.   Dg Chest Port 1 View  04/13/2015   CLINICAL DATA:  Shortness of breath.  History of COPD.  EXAM: PORTABLE CHEST - 1 VIEW  COMPARISON:  None.  FINDINGS: Stable COPD. Slight increase in interstitial prominence since the prior study may be consistent with a component of early interstitial edema. No focal airspace  consolidation, pleural fluid or pneumothorax is identified. The heart size and mediastinal contours are within normal limits.  IMPRESSION: Stable COPD with slight increased interstitial prominence. This may be consistent with early interstitial edema.   Electronically Signed   By: Aletta Edouard M.D.   On: 04/13/2015 07:52   Dg Chest Port 1 View  04/12/2015   CLINICAL DATA:  Cough.  EXAM: PORTABLE CHEST - 1 VIEW  COMPARISON:  04/09/2015  FINDINGS: The cardiomediastinal contours are unchanged. Mild hyperinflation and central bronchitic change. Pulmonary vasculature is normal. No consolidation, pleural effusion, or pneumothorax. No acute osseous abnormalities are seen.  IMPRESSION: Chronic hyperinflation and central bronchitic change. No  superimposed acute process.   Electronically Signed   By: Jeb Levering M.D.   On: 04/12/2015 06:47   Dg Chest Port 1 View  04/09/2015   CLINICAL DATA:  Right calf red and swollen, severe pain  EXAM: PORTABLE CHEST - 1 VIEW  COMPARISON:  08/10/2010  FINDINGS: The heart size and mediastinal contours are within normal limits. Both lungs are clear. The visualized skeletal structures are unremarkable.  IMPRESSION: No active disease.   Electronically Signed   By: Skipper Cliche M.D.   On: 04/09/2015 17:16    Micro Results      Recent Results (from the past 240 hour(s))  MRSA PCR Screening     Status: None   Collection Time: 04/09/15  8:29 PM  Result Value Ref Range Status   MRSA by PCR NEGATIVE NEGATIVE Final    Comment:        The GeneXpert MRSA Assay (FDA approved for NASAL specimens only), is one component of a comprehensive MRSA colonization surveillance program. It is not intended to diagnose MRSA infection nor to guide or monitor treatment for MRSA infections.   Urine culture     Status: None   Collection Time: 04/10/15  9:00 PM  Result Value Ref Range Status   Specimen Description URINE, CATHETERIZED  Final   Special Requests NONE  Final   Culture NO GROWTH 2 DAYS  Final   Report Status 04/12/2015 FINAL  Final       Today   Subjective    Khaya Theissen today has no headache,no chest abdominal pain,no new weakness tingling or numbness, feels much better wants to go home today.     Objective   Blood pressure 111/59, pulse 75, temperature 97.7 F (36.5 C), temperature source Oral, resp. rate 16, height 5\' 6"  (1.676 m), weight 76.3 kg (168 lb 3.4 oz), SpO2 92 %.   Intake/Output Summary (Last 24 hours) at 04/15/15 0924 Last data filed at 04/15/15 0544  Gross per 24 hour  Intake 1043.33 ml  Output    900 ml  Net 143.33 ml    Exam Awake Alert, Oriented x 3, No new F.N deficits, Normal affect [place].AT,PERRAL Supple Neck,No JVD, No cervical lymphadenopathy  appriciated.  Symmetrical Chest wall movement, Good air movement bilaterally, CTAB RRR,No Gallops,Rubs or new Murmurs, No Parasternal Heave +ve B.Sounds, Abd Soft, Non tender, No organomegaly appriciated, No rebound -guarding or rigidity. No Cyanosis, Clubbing or edema, No new Rash or bruise R leg in bandage with Posterior foot splint, good sensation, improving strength   Data Review   CBC w Diff: Lab Results  Component Value Date   WBC 9.9 04/15/2015   HGB 10.5* 04/15/2015   HCT 32.8* 04/15/2015   PLT 181 04/15/2015   LYMPHOPCT 21 04/09/2015   MONOPCT 10 04/09/2015   EOSPCT 0 04/09/2015  BASOPCT 0 04/09/2015    CMP: Lab Results  Component Value Date   NA 135 04/15/2015   K 3.9 04/15/2015   CL 106 04/15/2015   CO2 26 04/15/2015   BUN 12 04/15/2015   CREATININE 0.94 04/15/2015   PROT 6.3 12/27/2013   ALBUMIN 3.4* 12/27/2013   BILITOT 0.3 12/27/2013   ALKPHOS 72 12/27/2013   AST 22 12/27/2013   ALT 18 12/27/2013  . Lab Results  Component Value Date   HGBA1C 8.2* 04/09/2015      Total Time in preparing paper work, data evaluation and todays exam - 35 minutes  Thurnell Lose M.D on 04/15/2015 at 9:24 AM  [facility]   Office  [phone]

## 2015-04-15 NOTE — Progress Notes (Signed)
Physical Therapy Treatment Patient Details Name: [name] MRN: [id number] DOB: March 24, 1951 Today's Date: May 06, 2015    History of Present Illness pt is a 64 y/o female admitted with R foot pain s/p Thrombectomy of tibial arteries, Vein patch right popliteal artery, Right femoral to below knee popliteal bypass with Propaten, angiogram.    PT Comments    Patient did well today, progressing with mobility.  Feel patient safe for d/c home with HHPT and intermittent supervision.    Follow Up Recommendations  Home health PT;Supervision - Intermittent     Equipment Recommendations  None recommended by PT    Recommendations for Other Services       Precautions / Restrictions Precautions Precautions: Fall Restrictions Weight Bearing Restrictions: No    Mobility  Bed Mobility Overal bed mobility: Modified Independent             General bed mobility comments: used railing   Transfers Overall transfer level: Modified independent Equipment used: Rolling walker (2 wheeled) Transfers: Sit to/from Stand Sit to Stand: Modified independent (Device/Increase time)            Ambulation/Gait Ambulation/Gait assistance: Modified independent (Device/Increase time) Ambulation Distance (Feet): 30 Feet Assistive device: Rolling walker (2 wheeled) Gait Pattern/deviations: Step-to pattern;Decreased stance time - right;Decreased weight shift to right Gait velocity: slow and deliberate   General Gait Details: much better with sequencing, used splint with blue sock over it for gait today.   Stairs Stairs:  (verbally reviewed up/down steps.)          Wheelchair Mobility    Modified Rankin (Stroke Patients Only)       Balance   Sitting-balance support: No upper extremity supported Sitting balance-Leahy Scale: Good     Standing balance support: Bilateral upper extremity supported Standing balance-Leahy Scale: Poor Standing balance comment: used RW                     Cognition Arousal/Alertness: Awake/alert Behavior During Therapy: WFL for tasks assessed/performed Overall Cognitive Status: Within Functional Limits for tasks assessed                      Exercises      General Comments        Pertinent Vitals/Pain Pain Score: 4  Pain Location: RLE Pain Descriptors / Indicators: Aching;Sore Pain Intervention(s): Monitored during session    Home Living                      Prior Function            PT Goals (current goals can now be found in the care plan section) Progress towards PT goals: Progressing toward goals    Frequency       PT Plan Current plan remains appropriate    Co-evaluation             End of Session   Activity Tolerance: Patient tolerated treatment well Patient left: in bed;with call bell/phone within reach     Time: 1100-1110 PT Time Calculation (min) (ACUTE ONLY): 10 min  Charges:  $Gait Training: 8-22 mins                    G Codes:      Shanna Cisco 05/06/2015, 11:15 AM 05/06/15 [place], [place]

## 2015-04-15 NOTE — Progress Notes (Signed)
Patient d/c home today. Assessments remains unchanged prior to discharge. Will be following up with [facility] services. D/C instructions and paper work given. All questions answered.

## 2015-04-15 NOTE — Discharge Instructions (Signed)
Follow with Primary MD DONDIEGO,RICHARD M, MD in 7 days , Keep Right leg clean and dry at all times  Get CBC, CMP, 2 view Chest X ray checked  by Primary MD next visit.    Activity: As tolerated with Full fall precautions use walker/cane & assistance as needed   Disposition Home     Diet: Heart Healthy  Low Carb  Accuchecks 4 times/day, Once in AM empty stomach and then before each meal. Log in all results and show them to your Prim.MD in 3 days. If any glucose reading is under 80 or above 300 call your Prim MD immidiately. Follow Low glucose instructions for glucose under 80 as instructed.    For Heart failure patients - Check your Weight same time everyday, if you gain over 2 pounds, or you develop in leg swelling, experience more shortness of breath or chest pain, call your Primary MD immediately. Follow Cardiac Low Salt Diet and 1.5 lit/day fluid restriction.   On your next visit with your primary care physician please Get Medicines reviewed and adjusted.   Please request your Prim.MD to go over all Hospital Tests and Procedure/Radiological results at the follow up, please get all Hospital records sent to your Prim MD by signing hospital release before you go home.   If you experience worsening of your admission symptoms, develop shortness of breath, life threatening emergency, suicidal or homicidal thoughts you must seek medical attention immediately by calling 911 or calling your MD immediately  if symptoms less severe.  You Must read complete instructions/literature along with all the possible adverse reactions/side effects for all the Medicines you take and that have been prescribed to you. Take any new Medicines after you have completely understood and accpet all the possible adverse reactions/side effects.   Do not drive, operating heavy machinery, perform activities at heights, swimming or participation in water activities or provide baby sitting services if your were  admitted for syncope or siezures until you have seen by Primary MD or a Neurologist and advised to do so again.  Do not drive when taking Pain medications.    Do not take more than prescribed Pain, Sleep and Anxiety Medications  Special Instructions: If you have smoked or chewed Tobacco  in the last 2 yrs please stop smoking, stop any regular Alcohol  and or any Recreational drug use.  Wear Seat belts while driving.   Please note  You were cared for by a hospitalist during your hospital stay. If you have any questions about your discharge medications or the care you received while you were in the hospital after you are discharged, you can call the unit and asked to speak with the hospitalist on call if the hospitalist that took care of you is not available. Once you are discharged, your primary care physician will handle any further medical issues. Please note that NO REFILLS for any discharge medications will be authorized once you are discharged, as it is imperative that you return to your primary care physician (or establish a relationship with a primary care physician if you do not have one) for your aftercare needs so that they can reassess your need for medications and monitor your lab values.

## 2015-04-15 NOTE — Progress Notes (Signed)
      Ok to discharge from a vascular point of view.  Jailine Lieder MAUREEN PA-C

## 2015-04-15 NOTE — Progress Notes (Signed)
CM notified [facility] rep, Tiffany of pt discharge.  Previous CM notes VAC and referral for HHPT/OT/RN/Aide arranged with [facility].  No other CM needs were communicated.

## 2015-04-15 NOTE — Progress Notes (Signed)
CRITICAL VALUE ALERT  Critical value received:  1  Date of notification:  04/15/15  Time of notification:  1200  Critical value read back:Yes.    Nurse who received alert:  Jacquelynn Cree  MD notified (1st page):  Nil  Time of first page:  nil  MD notified (2nd page):  Time of second page:  Responding MD: nil  Time MD responded:  nil

## 2015-04-18 ENCOUNTER — Telehealth: Payer: Self-pay | Admitting: Vascular Surgery

## 2015-04-18 NOTE — Telephone Encounter (Signed)
-----   Message from Mena Goes, RN sent at 04/14/2015 10:54 AM EDT ----- Regarding: Schedule   ----- Message -----    From: Ulyses Amor, PA-C    Sent: 04/14/2015  10:24 AM      To: Vvs Charge Pool  Dr. Oneida Alar s/p fem-pop right LE with lateral fasciotomy and wound vac.  F/U in the office in 2 weeks

## 2015-04-18 NOTE — Telephone Encounter (Signed)
Lm for pt re appt, dpm  °

## 2015-04-20 ENCOUNTER — Encounter: Payer: Self-pay | Admitting: Cardiovascular Disease

## 2015-05-03 ENCOUNTER — Encounter: Payer: Self-pay | Admitting: Vascular Surgery

## 2015-05-04 ENCOUNTER — Ambulatory Visit (INDEPENDENT_AMBULATORY_CARE_PROVIDER_SITE_OTHER): Payer: Medicare Other | Admitting: Vascular Surgery

## 2015-05-04 ENCOUNTER — Encounter: Payer: Self-pay | Admitting: Vascular Surgery

## 2015-05-04 VITALS — BP 129/83 | HR 115 | Temp 98.6°F | Ht 66.0 in | Wt 175.9 lb

## 2015-05-04 DIAGNOSIS — I739 Peripheral vascular disease, unspecified: Secondary | ICD-10-CM

## 2015-05-04 NOTE — Progress Notes (Signed)
[facility] [place] HISTORY AND PHYSICAL   History of Present Illness:  Patient is a 64 y.o. year old female who presents for follow-up after recent right femoral to below-knee popliteal bypass and thrombectomy of her tibial vessels for acute on chronic ischemia. She also had a fasciotomy..  She is on Aspirin for antiplatelet therapy. Their atherosclerotic risk factors remain diabetes, hypertension, smoking.  Unfortunately she continues to smoke 1 pack of cigarettes per day. Greater than 3 minutes today's regarding smoking cessation counseling. These are all currently stable and followed by the primary care physician.  The patient was minimally active prior to her operation. She remains minimally active now and has to be encouraged to walk at all. She is currently ambulating with a walker. She tends to look for easy excuses not to walk. She had one vessel runoff via the posterior tibial artery at the conclusion of the case.  Past Medical History  Diagnosis Date  . Hypertension   . Diabetes mellitus without complication   . COPD (chronic obstructive pulmonary disease)   . Stroke   . Chronic back pain     Past Surgical History  Procedure Laterality Date  . Back fusion    . Cholecystectomy    . Foot surgery Bilateral   . Cardiac catheterization    . Peripheral vascular catheterization N/A 04/10/2015    Procedure: Abdominal Aortogram w/Lower Extremity;  Surgeon: Conrad [place], MD;  Location: [facility];  Service: Cardiovascular;  Laterality: N/A;  . Thrombectomy femoral artery Right 04/10/2015    Procedure: THROMBECTOMY RIGHT TIBIAL  ARTERY WITH VEIN PATCH ANGIOPLASTY;  Surgeon: Elam Dutch, MD;  Location: [facility];  Service: Vascular;  Laterality: Right;  . Fasciotomy Right 04/10/2015    Procedure: FOUR COMPARTMENT FASCIOTOMY;  Surgeon: Elam Dutch, MD;  Location: [facility] OR;  Service: Vascular;  Laterality: Right;  . Femoral-popliteal bypass graft Right 04/10/2015     Procedure: RIGHT  FEMORAL-BELOW THE KNEE POPLITEAL ARTERY BYPASS GRAFT USING 6 MM X 80 CM PROPATEN GRAFT;  Surgeon: Elam Dutch, MD;  Location: [facility];  Service: Vascular;  Laterality: Right;  . Intraoperative arteriogram Right 04/10/2015    Procedure: INTRA OPERATIVE ARTERIOGRAM;  Surgeon: Elam Dutch, MD;  Location: [facility] OR;  Service: Vascular;  Laterality: Right;   Social History Social History  Substance Use Topics  . Smoking status: Current Every Day Smoker -- 1.50 packs/day for 40 years    Types: Cigarettes  . Smokeless tobacco: Never Used  . Alcohol Use: No    Allergies  Allergies  Allergen Reactions  . Penicillins Other (See Comments)    Unknown reaction  . Ciprofloxacin Hives     Current Outpatient Prescriptions  Medication Sig Dispense Refill  . ALPRAZolam (XANAX) 0.25 MG tablet Take 0.25 mg by mouth 4 (four) times daily as needed for anxiety.     Marland Kitchen aspirin 325 MG tablet Take 1 tablet (325 mg total) by mouth daily. 30 tablet 0  . atorvastatin (LIPITOR) 20 MG tablet Take 1 tablet (20 mg total) by mouth daily at 6 PM. 30 tablet 0  . citalopram (CELEXA) 20 MG tablet Take 20 mg by mouth daily.    . Insulin Glargine (LANTUS SOLOSTAR) 100 UNIT/ML Solostar Pen Inject 34 Units into the skin at bedtime.    Marland Kitchen lisinopril (PRINIVIL,ZESTRIL) 20 MG tablet Take 20 mg by mouth daily.    . metFORMIN (GLUCOPHAGE) 500 MG tablet Take 500 mg by mouth 2 (  two) times daily with a meal.    . metoprolol tartrate (LOPRESSOR) 25 MG tablet Take 0.5 tablets (12.5 mg total) by mouth 2 (two) times daily. 60 tablet 0  . naproxen (NAPROSYN) 500 MG tablet Take 500 mg by mouth 2 (two) times daily.    Marland Kitchen oxyCODONE-acetaminophen (PERCOCET) 5-325 MG per tablet Take 1 tablet by mouth every 8 (eight) hours as needed for severe pain. 30 tablet 0  . pantoprazole (PROTONIX) 40 MG tablet Take 1 tablet (40 mg total) by mouth daily at 12 noon. (Patient taking differently: Take 40 mg by mouth daily. ) 30  tablet 2   No current facility-administered medications for this visit.    Physical Examination  Filed Vitals:   05/04/15 0845  BP: 129/83  Pulse: 115  Temp: 98.6 F (37 C)  TempSrc: Oral  Height: 5\' 6"  (1.676 m)  Weight: 175 lb 14.4 oz (79.788 kg)  SpO2: 95%    Body mass index is 28.4 kg/(m^2).  General:  Alert and oriented, no acute distress Neurologic: Right foot drop, no skin ulcers on either foot Extremities:  Right leg edematous extending from the mid thigh down to the foot right groin incision healing well below-knee incision healing well good granulation tissue over right lateral fasciotomy wound wound is approximately 20 x 5 cm with healthy appearing muscle tissue, right foot is warm. Skin: no ulcer or rash  ASSESSMENT: Healing incisions and right leg wound from fasciotomy after recent emergent right femoropopliteal bypass.   PLAN:  #1 patient counseled to quit smoking informed of very high risk of limb loss in the future if she does not especially in combination with diabetes. #2 patient told to walk at least 30 minutes daily. She will follow-up in one month to recheck her fasciotomy incision. She will continue local wound care on this. She will need a graft duplex scan in October 2016 and ABIs.  Ruta Hinds, MD [facility] [place] Office: [phone] Pager: [phone]

## 2015-05-16 ENCOUNTER — Telehealth: Payer: Self-pay | Admitting: *Deleted

## 2015-05-16 NOTE — Telephone Encounter (Signed)
Received call from Drinda Butts. [facility] nurse) regarding [name]'s right leg wound. Kathlee Nations states the wound size is 11cm x 4 cm x 0.2cm and is requesting to discontinue the wound vac. Drainage is minimal at this time. Patient is afebrile. I discussed this with Dr. Donnetta Hutching and will discontinue this wound vac. The treatment plan will switch to every other day dressing using calcium alginate to right leg wound.

## 2015-05-30 ENCOUNTER — Ambulatory Visit (INDEPENDENT_AMBULATORY_CARE_PROVIDER_SITE_OTHER): Payer: Medicare Other | Admitting: Cardiovascular Disease

## 2015-05-30 ENCOUNTER — Encounter: Payer: Self-pay | Admitting: Cardiovascular Disease

## 2015-05-30 VITALS — BP 130/65 | HR 109 | Ht 66.0 in | Wt 174.0 lb

## 2015-05-30 DIAGNOSIS — R000 Tachycardia, unspecified: Secondary | ICD-10-CM

## 2015-05-30 DIAGNOSIS — I471 Supraventricular tachycardia: Secondary | ICD-10-CM | POA: Diagnosis not present

## 2015-05-30 MED ORDER — METOPROLOL TARTRATE 25 MG PO TABS: 25.0000 mg | ORAL_TABLET | Freq: Two times a day (BID) | ORAL | Status: DC

## 2015-05-30 NOTE — Progress Notes (Signed)
Cardiology Office Note   Date:  05/30/2015   ID:  Debra, Glover April 26, 1951, MRN [id number]  PCP:  Maricela Curet, MD  Cardiologist:   Thayer Headings, MD   Chief Complaint  Patient presents with  . Leg Swelling   Problem List 1. Sinus tach     History of Present Illness: [name] is a 64 y.o. female who presents for follow up of her sinus tachycardia      Past Medical History  Diagnosis Date  . Hypertension   . Diabetes mellitus without complication   . COPD (chronic obstructive pulmonary disease)   . Stroke   . Chronic back pain     Past Surgical History  Procedure Laterality Date  . Back fusion    . Cholecystectomy    . Foot surgery Bilateral   . Cardiac catheterization    . Peripheral vascular catheterization N/A 04/10/2015    Procedure: Abdominal Aortogram w/Lower Extremity;  Surgeon: Conrad [place], MD;  Location: [facility];  Service: Cardiovascular;  Laterality: N/A;  . Thrombectomy femoral artery Right 04/10/2015    Procedure: THROMBECTOMY RIGHT TIBIAL  ARTERY WITH VEIN PATCH ANGIOPLASTY;  Surgeon: Elam Dutch, MD;  Location: [place];  Service: Vascular;  Laterality: Right;  . Fasciotomy Right 04/10/2015    Procedure: FOUR COMPARTMENT FASCIOTOMY;  Surgeon: Elam Dutch, MD;  Location: [facility] OR;  Service: Vascular;  Laterality: Right;  . Femoral-popliteal bypass graft Right 04/10/2015    Procedure: RIGHT  FEMORAL-BELOW THE KNEE POPLITEAL ARTERY BYPASS GRAFT USING 6 MM X 80 CM PROPATEN GRAFT;  Surgeon: Elam Dutch, MD;  Location: [facility];  Service: Vascular;  Laterality: Right;  . Intraoperative arteriogram Right 04/10/2015    Procedure: INTRA OPERATIVE ARTERIOGRAM;  Surgeon: Elam Dutch, MD;  Location: [facility] OR;  Service: Vascular;  Laterality: Right;  . Carotid endarterectomy      Right CEA Dr Lawson 2010     Current Outpatient Prescriptions  Medication Sig Dispense Refill  . ALPRAZolam (XANAX) 0.25 MG tablet Take 0.25 mg by  mouth 4 (four) times daily as needed for anxiety.     Marland Kitchen aspirin 325 MG tablet Take 1 tablet (325 mg total) by mouth daily. 30 tablet 0  . atorvastatin (LIPITOR) 20 MG tablet Take 1 tablet (20 mg total) by mouth daily at 6 PM. 30 tablet 0  . citalopram (CELEXA) 20 MG tablet Take 20 mg by mouth daily.    . Insulin Glargine (LANTUS SOLOSTAR) 100 UNIT/ML Solostar Pen Inject 34 Units into the skin at bedtime.    Marland Kitchen lisinopril (PRINIVIL,ZESTRIL) 20 MG tablet Take 20 mg by mouth daily.    . metFORMIN (GLUCOPHAGE) 500 MG tablet Take 500 mg by mouth 2 (two) times daily with a meal.    . metoprolol tartrate (LOPRESSOR) 25 MG tablet Take 0.5 tablets (12.5 mg total) by mouth 2 (two) times daily. 60 tablet 0  . naproxen (NAPROSYN) 500 MG tablet Take 500 mg by mouth 2 (two) times daily.    Marland Kitchen oxyCODONE-acetaminophen (PERCOCET) 5-325 MG per tablet Take 1 tablet by mouth every 8 (eight) hours as needed for severe pain. 30 tablet 0  . pantoprazole (PROTONIX) 40 MG tablet Take 1 tablet (40 mg total) by mouth daily at 12 noon. (Patient taking differently: Take 40 mg by mouth daily. ) 30 tablet 2   No current facility-administered medications for this visit.    Allergies:   Penicillins and Ciprofloxacin  Social History:  The patient  reports that she has been smoking Cigarettes.  She has a 60 pack-year smoking history. She has never used smokeless tobacco. She reports that she does not drink alcohol or use illicit drugs.   Family History:  The patient's family history includes Cancer in her father; Heart attack in her sister; Hypertension in her brother and sister; Stroke in her sister.    ROS:  Please see the history of present illness.    Review of Systems: Constitutional:  denies fever, chills, diaphoresis, appetite change and fatigue.  HEENT: denies photophobia, eye pain, redness, hearing loss, ear pain, congestion, sore throat, rhinorrhea, sneezing, neck pain, neck stiffness and tinnitus.  Respiratory:  denies SOB, DOE, cough, chest tightness, and wheezing.  Cardiovascular: denies chest pain, palpitations and leg swelling.  Gastrointestinal: denies nausea, vomiting, abdominal pain, diarrhea, constipation, blood in stool.  Genitourinary: denies dysuria, urgency, frequency, hematuria, flank pain and difficulty urinating.  Musculoskeletal: denies  myalgias, back pain, joint swelling, arthralgias and gait problem.   Skin: denies pallor, rash and wound.  Neurological: denies dizziness, seizures, syncope, weakness, light-headedness, numbness and headaches.   Hematological: denies adenopathy, easy bruising, personal or family bleeding history.  Psychiatric/ Behavioral: denies suicidal ideation, mood changes, confusion, nervousness, sleep disturbance and agitation.       All other systems are reviewed and negative.    PHYSICAL EXAM: VS:  BP 130/65 mmHg  Pulse 109  Ht 5\' 6"  (1.676 m)  Wt 78.926 kg (174 lb)  BMI 28.10 kg/m2 , BMI Body mass index is 28.1 kg/(m^2). GEN: Well nourished, well developed, in no acute distress HEENT: normal Neck: no JVD, carotid bruits, or masses Cardiac:   Tachycardia , RRR; no murmurs, rubs, or gallops,no edema  Respiratory:  clear to auscultation bilaterally, normal work of breathing GI: soft, nontender, nondistended, + BS MS: no deformity or atrophy Skin: warm and dry, no rash Neuro:  Strength and sensation are intact Psych: normal   EKG:  EKG is not ordered today.    Recent Labs: 04/12/2015: TSH 0.569 04/15/2015: BUN 12; Creatinine, Ser 0.94; Hemoglobin 10.5*; Platelets 181; Potassium 3.9; Sodium 135    Lipid Panel    Component Value Date/Time   CHOL 80 04/12/2015 0223   TRIG 53 04/12/2015 0223   HDL 32* 04/12/2015 0223   CHOLHDL 2.5 04/12/2015 0223   VLDL 11 04/12/2015 0223   LDLCALC 37 04/12/2015 0223      Wt Readings from Last 3 Encounters:  05/30/15 78.926 kg (174 lb)  05/04/15 79.788 kg (175 lb 14.4 oz)  04/09/15 76.3 kg (168 lb 3.4  oz)      Other studies Reviewed: Additional studies/ records that were reviewed today include: . Review of the above records demonstrates:    ASSESSMENT AND PLAN:  1.  Sinus tachycardia: The patient presents with persistent tachycardia. There was some question about whether or not this was supraventricular tachycardia. I tried carotid sinus massage today and it did not slow the heart rate all. We'll increase the metoprolol to 25 mg twice a day. We'll see her back for an EKG in 3 months.  2. Peripheral vascular disease: The patient has severe peripheral acid disease per cheese had revascularization of her right leg. She continues to smoke. Of strongly advised her to stop smoking.   Current medicines are reviewed at length with the patient today.  The patient does not have concerns regarding medicines.  The following changes have been made:  no change  Labs/  tests ordered today include:  No orders of the defined types were placed in this encounter.     Disposition:   FU with me in 3 months      Nahser, Wonda Cheng, MD  05/30/2015 2:50 PM    [facility] [place], [place], [place]  [address] Phone: [phone]; Fax: [phone]   [facility]  [address] [place], [place]  [address] [phone]   Fax [phone]

## 2015-05-30 NOTE — Patient Instructions (Signed)
Medication Instructions:  INCREASE Metoprolol (Lopressor) to 25 mg twice daily    Labwork: None Ordered   Testing/Procedures: None Ordered   Follow-Up: Your physician recommends that you schedule a follow-up appointment in: 3 months with Dr. Acie Fredrickson.

## 2015-06-02 ENCOUNTER — Telehealth: Payer: Self-pay

## 2015-06-02 NOTE — Telephone Encounter (Signed)
[name] called about getting glucose test strip prescription sent in for [name]. She stated that her PCP office is closed and the pharmacy told her to try our office. I explained we are a Cardiology practice and do not refill non cardiac meds. I asked for her phone number and told her I would call her back, I called the PCP (Dr. Cindie Laroche) and verified his office is open until 12pm. I called [name] back and verified she had the correct phone number. She did and I explained she needed to call them. [name] stated her understanding.

## 2015-06-06 ENCOUNTER — Encounter: Payer: Self-pay | Admitting: Vascular Surgery

## 2015-06-08 ENCOUNTER — Ambulatory Visit (INDEPENDENT_AMBULATORY_CARE_PROVIDER_SITE_OTHER): Payer: Self-pay | Admitting: Vascular Surgery

## 2015-06-08 ENCOUNTER — Encounter: Payer: Self-pay | Admitting: Vascular Surgery

## 2015-06-08 VITALS — BP 125/59 | HR 66 | Temp 98.3°F | Resp 16 | Ht 66.0 in | Wt 172.0 lb

## 2015-06-08 DIAGNOSIS — I739 Peripheral vascular disease, unspecified: Secondary | ICD-10-CM

## 2015-06-08 NOTE — Progress Notes (Signed)
[facility] [place] HISTORY AND PHYSICAL   History of Present Illness:  Patient is a 64 y.o. year old female who presents for follow-up after recent right femoral to below-knee popliteal bypass and thrombectomy of her tibial vessels for acute on chronic ischemia. She also had a fasciotomy and returns today for follow-up evaluation of her fasciotomy wound .  She is on Aspirin for antiplatelet therapy. Their atherosclerotic risk factors remain diabetes, hypertension, smoking.  Unfortunately she continues to smoke 1 pack of cigarettes per day. Greater than 3 minutes today's regarding smoking cessation counseling. These are all currently stable and followed by the primary care physician.  The patient was minimally active prior to her operation. She remains minimally active now and has to be encouraged to walk at all. She is currently ambulating with a walker. She tends to look for easy excuses not to walk. She had one vessel runoff via the posterior tibial artery at the conclusion of the case.     Past Medical History   Diagnosis  Date   .  Hypertension     .  Diabetes mellitus without complication     .  COPD (chronic obstructive pulmonary disease)     .  Stroke     .  Chronic back pain         Past Surgical History   Procedure  Laterality  Date   .  Back fusion       .  Cholecystectomy       .  Foot surgery  Bilateral     .  Cardiac catheterization       .  Peripheral vascular catheterization  N/A  04/10/2015       Procedure: Abdominal Aortogram w/Lower Extremity;  Surgeon: Conrad , MD;  Location: [facility];  Service: Cardiovascular;  Laterality: N/A;   .  Thrombectomy femoral artery  Right  04/10/2015       Procedure: THROMBECTOMY RIGHT TIBIAL  ARTERY WITH VEIN PATCH ANGIOPLASTY;  Surgeon: Elam Dutch, MD;  Location: [place];  Service: Vascular;  Laterality: Right;   .  Fasciotomy  Right  04/10/2015       Procedure: FOUR COMPARTMENT FASCIOTOMY;  Surgeon:  Elam Dutch, MD;  Location: [facility] OR;  Service: Vascular;  Laterality: Right;   .  Femoral-popliteal bypass graft  Right  04/10/2015       Procedure: RIGHT  FEMORAL-BELOW THE KNEE POPLITEAL ARTERY BYPASS GRAFT USING 6 MM X 80 CM PROPATEN GRAFT;  Surgeon: Elam Dutch, MD;  Location: [place];  Service: Vascular;  Laterality: Right;   .  Intraoperative arteriogram  Right  04/10/2015       Procedure: INTRA OPERATIVE ARTERIOGRAM;  Surgeon: Elam Dutch, MD;  Location: [facility] OR;  Service: Vascular;  Laterality: Right;    Social History Social History   Substance Use Topics   .  Smoking status:  Current Every Day Smoker -- 1.50 packs/day for 40 years       Types:  Cigarettes   .  Smokeless tobacco:  Never Used   .  Alcohol Use:  No     Allergies    Allergies   Allergen  Reactions   .  Penicillins  Other (See Comments)       Unknown reaction   .  Ciprofloxacin  Hives        Current Outpatient Prescriptions   Medication  Sig  Dispense  Refill   .  ALPRAZolam (XANAX) 0.25 MG tablet  Take 0.25 mg by mouth 4 (four) times daily as needed for anxiety.        Marland Kitchen  aspirin 325 MG tablet  Take 1 tablet (325 mg total) by mouth daily.  30 tablet  0   .  atorvastatin (LIPITOR) 20 MG tablet  Take 1 tablet (20 mg total) by mouth daily at 6 PM.  30 tablet  0   .  citalopram (CELEXA) 20 MG tablet  Take 20 mg by mouth daily.       .  Insulin Glargine (LANTUS SOLOSTAR) 100 UNIT/ML Solostar Pen  Inject 34 Units into the skin at bedtime.       Marland Kitchen  lisinopril (PRINIVIL,ZESTRIL) 20 MG tablet  Take 20 mg by mouth daily.       .  metFORMIN (GLUCOPHAGE) 500 MG tablet  Take 500 mg by mouth 2 (two) times daily with a meal.       .  metoprolol tartrate (LOPRESSOR) 25 MG tablet  Take 0.5 tablets (12.5 mg total) by mouth 2 (two) times daily.  60 tablet  0   .  naproxen (NAPROSYN) 500 MG tablet  Take 500 mg by mouth 2 (two) times daily.       Marland Kitchen  oxyCODONE-acetaminophen (PERCOCET) 5-325 MG per tablet  Take 1 tablet by  mouth every 8 (eight) hours as needed for severe pain.  30 tablet  0   .  pantoprazole (PROTONIX) 40 MG tablet  Take 1 tablet (40 mg total) by mouth daily at 12 noon. (Patient taking differently: Take 40 mg by mouth daily. )  30 tablet  2      No current facility-administered medications for this visit.     Physical Examination    Filed Vitals:   06/08/15 1017  BP: 125/59  Pulse: 66  Temp: 98.3 F (36.8 C)  TempSrc: Oral  Resp: 16  Height: 5\' 6"  (1.676 m)  Weight: 172 lb (78.019 kg)  SpO2: 97%    General:  Alert and oriented, no acute distress Neurologic: Right foot drop, no skin ulcers on either foot Extremities:  Right leg edematous extending from the mid thigh down to the foot right groin incision healing well below-knee incision healing well good granulation tissue over right lateral fasciotomy wound wound 80% healed at this point, right foot is warm. Skin: no ulcer or rash  ASSESSMENT: Healing incisions and right leg wound from fasciotomy after recent emergent right femoropopliteal bypass.   PLAN:  #1 patient counseled to quit smoking informed of very high risk of limb loss in the future if she does not especially in combination with diabetes. #2 patient told to walk at least 30 minutes daily. She will follow-up in one month to recheck her fasciotomy incision. She will continue local wound care on this. She will need a graft duplex scan and ABIs at that time.  Ruta Hinds, MD [facility] [place] Office: [phone] Pager: [phone]

## 2015-07-11 ENCOUNTER — Encounter: Payer: Self-pay | Admitting: Vascular Surgery

## 2015-07-12 ENCOUNTER — Other Ambulatory Visit: Payer: Self-pay | Admitting: *Deleted

## 2015-07-12 DIAGNOSIS — I739 Peripheral vascular disease, unspecified: Secondary | ICD-10-CM

## 2015-07-12 DIAGNOSIS — Z48812 Encounter for surgical aftercare following surgery on the circulatory system: Secondary | ICD-10-CM

## 2015-07-13 ENCOUNTER — Encounter: Payer: Self-pay | Admitting: Vascular Surgery

## 2015-07-13 ENCOUNTER — Ambulatory Visit (INDEPENDENT_AMBULATORY_CARE_PROVIDER_SITE_OTHER): Payer: Medicare Other | Admitting: Vascular Surgery

## 2015-07-13 ENCOUNTER — Ambulatory Visit (HOSPITAL_COMMUNITY)
Admission: RE | Admit: 2015-07-13 | Discharge: 2015-07-13 | Disposition: A | Discharge status: Home / Self Care | Payer: Medicare Other | Source: Ambulatory Visit | Attending: Vascular Surgery | Admitting: Vascular Surgery

## 2015-07-13 ENCOUNTER — Ambulatory Visit (INDEPENDENT_AMBULATORY_CARE_PROVIDER_SITE_OTHER)
Admission: RE | Admit: 2015-07-13 | Discharge: 2015-07-13 | Disposition: A | Discharge status: Home / Self Care | Payer: Medicare Other | Source: Ambulatory Visit | Attending: Vascular Surgery | Admitting: Vascular Surgery

## 2015-07-13 VITALS — BP 164/86 | HR 102 | Temp 97.6°F | Resp 20 | Ht 66.0 in | Wt 182.0 lb

## 2015-07-13 DIAGNOSIS — I739 Peripheral vascular disease, unspecified: Secondary | ICD-10-CM

## 2015-07-13 DIAGNOSIS — Z48812 Encounter for surgical aftercare following surgery on the circulatory system: Secondary | ICD-10-CM | POA: Diagnosis not present

## 2015-07-13 NOTE — Addendum Note (Signed)
Addended by: Dorthula Rue L on: 07/13/2015 02:01 PM   Modules accepted: Orders

## 2015-07-13 NOTE — Progress Notes (Signed)
[facility] [place] HISTORY AND PHYSICAL   History of Present Illness:  Patient is a 64 year old female who presents for follow-up after recent right femoral to below-knee popliteal bypass and thrombectomy of her tibial vessels for acute on chronic ischemia. She also had a fasciotomy and returns today for follow-up evaluation of her fasciotomy wound .  She is on Aspirin for antiplatelet therapy. Their atherosclerotic risk factors remain diabetes, hypertension, smoking.  she has not smoked for 2 weeks. These are all currently stable and followed by the primary care physician.  The patient was minimally active prior to her operation. She remains minimally active now and has to be encouraged to walk more. She is currently ambulating with a walker.  She had one vessel runoff via the posterior tibial artery at the conclusion of the case.      Past Medical History    Diagnosis   Date    .   Hypertension       .   Diabetes mellitus without complication       .   COPD (chronic obstructive pulmonary disease)       .   Stroke       .   Chronic back pain           Past Surgical History    Procedure   Laterality   Date    .   Back fusion          .   Cholecystectomy          .   Foot surgery   Bilateral       .   Cardiac catheterization          .   Peripheral vascular catheterization   N/A   04/10/2015          Procedure: Abdominal Aortogram w/Lower Extremity;  Surgeon: Conrad Renningers, MD;  Location: [place] [facility];  Service: Cardiovascular;  Laterality: N/A;    .   Thrombectomy femoral artery   Right   04/10/2015          Procedure: THROMBECTOMY RIGHT TIBIAL  ARTERY WITH VEIN PATCH ANGIOPLASTY;  Surgeon: Elam Dutch, MD;  Location: [place];  Service: Vascular;  Laterality: Right;    .   Fasciotomy   Right   04/10/2015          Procedure: FOUR COMPARTMENT FASCIOTOMY;  Surgeon: Elam Dutch, MD;  Location: [facility];  Service: Vascular;  Laterality: Right;    .    Femoral-popliteal bypass graft   Right   04/10/2015          Procedure: RIGHT  FEMORAL-BELOW THE KNEE POPLITEAL ARTERY BYPASS GRAFT USING 6 MM X 80 CM PROPATEN GRAFT;  Surgeon: Elam Dutch, MD;  Location: [place];  Service: Vascular;  Laterality: Right;    .   Intraoperative arteriogram   Right   04/10/2015          Procedure: INTRA OPERATIVE ARTERIOGRAM;  Surgeon: Elam Dutch, MD;  [facility];  Service: Vascular;  Laterality: Right;      Physical Examination    Filed Vitals:   07/13/15 1120 07/13/15 1125  BP: 160/83 164/86  Pulse: 100 102  Temp: 97.6 F (36.4 C)   TempSrc: Oral   Resp: 20   Height: 5\' 6"  (1.676 m)   Weight: 182 lb (82.555 kg)   SpO2: 97%    General:  Alert and  oriented, no acute distress Neurologic: Right foot drop, no skin ulcers on either foot Extremities:  Right leg edematous extending from the mid thigh down to the foot right groin incision healing well below-knee incision healing well good granulation tissue over right lateral fasciotomy wound wound 90% healed at this point, right foot is warm. Skin: no ulcer or rash  Data: Patient had bilateral ABIs performed today which were 1.09 on the right 0.96 on the left with biphasic waveforms. Graft duplex showed no increased velocities throughout the bypass graft.  ASSESSMENT: Healing incisions and right leg wound from fasciotomy after recent emergent right femoropopliteal bypass.   PLAN:  #1 patient counseled to continue to refrain from smoking. #2 patient told to walk at least 30 minutes daily. She will follow-up in 3 months. She will continue local wound care on her fasciotomy. She will need a graft duplex scan and ABIs at that time.  Ruta Hinds, MD [facility] [place] Office: [phone] Pager: [phone]

## 2015-08-17 ENCOUNTER — Encounter (HOSPITAL_COMMUNITY): Payer: Self-pay | Admitting: Emergency Medicine

## 2015-08-17 ENCOUNTER — Emergency Department (HOSPITAL_COMMUNITY): Payer: Medicare Other

## 2015-08-17 ENCOUNTER — Inpatient Hospital Stay (HOSPITAL_COMMUNITY)
Admission: EM | Admit: 2015-08-17 | Discharge: 2015-08-23 | DRG: 292 | Disposition: A | Discharge status: Home / Self Care | Payer: Medicare Other | Attending: Family Medicine | Admitting: Family Medicine

## 2015-08-17 DIAGNOSIS — S81009A Unspecified open wound, unspecified knee, initial encounter: Secondary | ICD-10-CM | POA: Diagnosis present

## 2015-08-17 DIAGNOSIS — R000 Tachycardia, unspecified: Secondary | ICD-10-CM

## 2015-08-17 DIAGNOSIS — J411 Mucopurulent chronic bronchitis: Secondary | ICD-10-CM

## 2015-08-17 DIAGNOSIS — R0602 Shortness of breath: Secondary | ICD-10-CM | POA: Diagnosis not present

## 2015-08-17 DIAGNOSIS — Z8249 Family history of ischemic heart disease and other diseases of the circulatory system: Secondary | ICD-10-CM

## 2015-08-17 DIAGNOSIS — I110 Hypertensive heart disease with heart failure: Secondary | ICD-10-CM | POA: Diagnosis not present

## 2015-08-17 DIAGNOSIS — I4891 Unspecified atrial fibrillation: Secondary | ICD-10-CM | POA: Diagnosis present

## 2015-08-17 DIAGNOSIS — Z9981 Dependence on supplemental oxygen: Secondary | ICD-10-CM

## 2015-08-17 DIAGNOSIS — B029 Zoster without complications: Secondary | ICD-10-CM

## 2015-08-17 DIAGNOSIS — Z8673 Personal history of transient ischemic attack (TIA), and cerebral infarction without residual deficits: Secondary | ICD-10-CM

## 2015-08-17 DIAGNOSIS — Z79891 Long term (current) use of opiate analgesic: Secondary | ICD-10-CM

## 2015-08-17 DIAGNOSIS — E119 Type 2 diabetes mellitus without complications: Secondary | ICD-10-CM

## 2015-08-17 DIAGNOSIS — Z79899 Other long term (current) drug therapy: Secondary | ICD-10-CM

## 2015-08-17 DIAGNOSIS — I471 Supraventricular tachycardia: Secondary | ICD-10-CM | POA: Diagnosis present

## 2015-08-17 DIAGNOSIS — I10 Essential (primary) hypertension: Secondary | ICD-10-CM

## 2015-08-17 DIAGNOSIS — J810 Acute pulmonary edema: Secondary | ICD-10-CM | POA: Diagnosis present

## 2015-08-17 DIAGNOSIS — Z823 Family history of stroke: Secondary | ICD-10-CM

## 2015-08-17 DIAGNOSIS — Z7982 Long term (current) use of aspirin: Secondary | ICD-10-CM

## 2015-08-17 DIAGNOSIS — I509 Heart failure, unspecified: Secondary | ICD-10-CM

## 2015-08-17 DIAGNOSIS — S81809A Unspecified open wound, unspecified lower leg, initial encounter: Secondary | ICD-10-CM

## 2015-08-17 DIAGNOSIS — E1165 Type 2 diabetes mellitus with hyperglycemia: Secondary | ICD-10-CM | POA: Diagnosis present

## 2015-08-17 DIAGNOSIS — E0810 Diabetes mellitus due to underlying condition with ketoacidosis without coma: Secondary | ICD-10-CM

## 2015-08-17 DIAGNOSIS — Z794 Long term (current) use of insulin: Secondary | ICD-10-CM

## 2015-08-17 DIAGNOSIS — I517 Cardiomegaly: Secondary | ICD-10-CM

## 2015-08-17 DIAGNOSIS — Z87891 Personal history of nicotine dependence: Secondary | ICD-10-CM

## 2015-08-17 DIAGNOSIS — J449 Chronic obstructive pulmonary disease, unspecified: Secondary | ICD-10-CM | POA: Diagnosis present

## 2015-08-17 DIAGNOSIS — M7989 Other specified soft tissue disorders: Secondary | ICD-10-CM

## 2015-08-17 DIAGNOSIS — T380X5A Adverse effect of glucocorticoids and synthetic analogues, initial encounter: Secondary | ICD-10-CM | POA: Diagnosis not present

## 2015-08-17 DIAGNOSIS — S91009A Unspecified open wound, unspecified ankle, initial encounter: Secondary | ICD-10-CM

## 2015-08-17 DIAGNOSIS — I998 Other disorder of circulatory system: Secondary | ICD-10-CM

## 2015-08-17 HISTORY — DX: Zoster without complications: B02.9

## 2015-08-17 LAB — CBC WITH DIFFERENTIAL/PLATELET
BASOS ABS: 0 10*3/uL (ref 0.0–0.1)
Basophils Relative: 0 %
EOS PCT: 2 %
Eosinophils Absolute: 0.3 10*3/uL (ref 0.0–0.7)
HEMATOCRIT: 38.6 % (ref 36.0–46.0)
HEMOGLOBIN: 11.8 g/dL — AB (ref 12.0–15.0)
LYMPHS PCT: 17 %
Lymphs Abs: 2 10*3/uL (ref 0.7–4.0)
MCH: 27 pg (ref 26.0–34.0)
MCHC: 30.6 g/dL (ref 30.0–36.0)
MCV: 88.3 fL (ref 78.0–100.0)
Monocytes Absolute: 1 10*3/uL (ref 0.1–1.0)
Monocytes Relative: 9 %
NEUTROS ABS: 8.2 10*3/uL — AB (ref 1.7–7.7)
NEUTROS PCT: 72 %
PLATELETS: 328 10*3/uL (ref 150–400)
RBC: 4.37 MIL/uL (ref 3.87–5.11)
RDW: 17.9 % — ABNORMAL HIGH (ref 11.5–15.5)
WBC: 11.5 10*3/uL — AB (ref 4.0–10.5)

## 2015-08-17 LAB — COMPREHENSIVE METABOLIC PANEL
ALT: 13 U/L — ABNORMAL LOW (ref 14–54)
ANION GAP: 8 (ref 5–15)
AST: 18 U/L (ref 15–41)
Albumin: 3.2 g/dL — ABNORMAL LOW (ref 3.5–5.0)
Alkaline Phosphatase: 98 U/L (ref 38–126)
BILIRUBIN TOTAL: 1 mg/dL (ref 0.3–1.2)
BUN: 22 mg/dL — ABNORMAL HIGH (ref 6–20)
CHLORIDE: 96 mmol/L — AB (ref 101–111)
CO2: 33 mmol/L — ABNORMAL HIGH (ref 22–32)
Calcium: 9 mg/dL (ref 8.9–10.3)
Creatinine, Ser: 0.81 mg/dL (ref 0.44–1.00)
Glucose, Bld: 130 mg/dL — ABNORMAL HIGH (ref 65–99)
POTASSIUM: 4 mmol/L (ref 3.5–5.1)
Sodium: 137 mmol/L (ref 135–145)
TOTAL PROTEIN: 7.3 g/dL (ref 6.5–8.1)

## 2015-08-17 LAB — URINALYSIS, ROUTINE W REFLEX MICROSCOPIC
Bilirubin Urine: NEGATIVE
GLUCOSE, UA: NEGATIVE mg/dL
Hgb urine dipstick: NEGATIVE
Ketones, ur: NEGATIVE mg/dL
LEUKOCYTES UA: NEGATIVE
Nitrite: NEGATIVE
PROTEIN: NEGATIVE mg/dL
SPECIFIC GRAVITY, URINE: 1.02 (ref 1.005–1.030)
pH: 6 (ref 5.0–8.0)

## 2015-08-17 LAB — BRAIN NATRIURETIC PEPTIDE: B Natriuretic Peptide: 961 pg/mL — ABNORMAL HIGH (ref 0.0–100.0)

## 2015-08-17 MED ORDER — SODIUM CHLORIDE 0.9 % IV SOLN
INTRAVENOUS | Status: DC
  Administered 2015-08-17: 22:00:00 via INTRAVENOUS

## 2015-08-17 MED ORDER — FUROSEMIDE 10 MG/ML IJ SOLN
40.0000 mg | Freq: Once | INTRAMUSCULAR | Status: AC
  Administered 2015-08-17: 40 mg via INTRAVENOUS
  Filled 2015-08-17: qty 4

## 2015-08-17 MED ORDER — METOPROLOL TARTRATE 25 MG PO TABS
25.0000 mg | ORAL_TABLET | Freq: Once | ORAL | Status: AC
  Administered 2015-08-17: 25 mg via ORAL
  Filled 2015-08-17: qty 1

## 2015-08-17 NOTE — ED Notes (Signed)
Pt is being treated for shingles x 2 weeks

## 2015-08-17 NOTE — ED Notes (Signed)
Pt noticed generalized edema in leg getting worse yesterday, worse in legs today. Noticed some SOB today.

## 2015-08-17 NOTE — ED Provider Notes (Addendum)
CSN: [id number]     Arrival date & time 08/17/15  1949 History  By signing my name below, I, Jolayne Panther, attest that this documentation has been prepared under the direction and in the presence of Fredia Sorrow, MD. Electronically Signed: Jolayne Panther, Scribe. 08/17/2015. 9:58 PM.    Chief Complaint  Patient presents with  . Leg Swelling  . Tachycardia    The history is provided by the patient. No language interpreter was used.    HPI Comments: [name] is a 64 y.o. female with a h/o COPD, HTN, and DM who presents to the Emergency Department complaining of edema and fluid build up bilaterally in her lower extremities two days ago with radiation into her upper extremities. She also complains of a recent head cold, a rash on her temporal lobes, and seven incidents of diarrhea today. Pt also reports back pain and SOB today but states that this is normal. She reports that she is on oxygen at home but is unable to recall how much and states that she uses a nebulizer at home as well. She denies recent chest pain, difficulty with urination, visual disturbance, dysuria, hematuria, nausea, abdominal pain, and vomiting.   Past Medical History  Diagnosis Date  . Hypertension   . Diabetes mellitus without complication ([place])   . COPD (chronic obstructive pulmonary disease) ([place])   . Stroke ([place])   . Chronic back pain    Past Surgical History  Procedure Laterality Date  . Back fusion    . Cholecystectomy    . Foot surgery Bilateral   . Cardiac catheterization    . Peripheral vascular catheterization N/A 04/10/2015    Procedure: Abdominal Aortogram w/Lower Extremity;  Surgeon: Conrad Linganore, MD;  Location: [place] [facility];  Service: Cardiovascular;  Laterality: N/A;  . Thrombectomy femoral artery Right 04/10/2015    Procedure: THROMBECTOMY RIGHT TIBIAL  ARTERY WITH VEIN PATCH ANGIOPLASTY;  Surgeon: Elam Dutch, MD;  Location: [place];  Service: Vascular;  Laterality:  Right;  . Fasciotomy Right 04/10/2015    Procedure: FOUR COMPARTMENT FASCIOTOMY;  Surgeon: Elam Dutch, MD;  Location: [facility] OR;  Service: Vascular;  Laterality: Right;  . Femoral-popliteal bypass graft Right 04/10/2015    Procedure: RIGHT  FEMORAL-BELOW THE KNEE POPLITEAL ARTERY BYPASS GRAFT USING 6 MM X 80 CM PROPATEN GRAFT;  Surgeon: Elam Dutch, MD;  Location: [place];  Service: Vascular;  Laterality: Right;  . Intraoperative arteriogram Right 04/10/2015    Procedure: INTRA OPERATIVE ARTERIOGRAM;  Surgeon: Elam Dutch, MD;  Location: [facility] OR;  Service: Vascular;  Laterality: Right;  . Carotid endarterectomy      Right [facility] Dr Kellie Simmering 2010   Family History  Problem Relation Age of Onset  . Cancer Father   . Heart attack Sister   . Stroke Sister   . Hypertension Sister   . Hypertension Brother    Social History  Substance Use Topics  . Smoking status: Former Smoker -- 40 years    Types: Cigarettes    Quit date: 06/29/2015  . Smokeless tobacco: Never Used  . Alcohol Use: No   OB History    Gravida Para Term Preterm AB TAB SAB Ectopic Multiple Living   3 3 3       3      Review of Systems  Constitutional: Negative for fever and chills.  HENT: Positive for rhinorrhea. Negative for sore throat.   Eyes: Negative for visual disturbance.  Respiratory:  Positive for cough and shortness of breath.   Cardiovascular: Positive for leg swelling. Negative for chest pain.  Gastrointestinal: Positive for diarrhea. Negative for nausea, vomiting and abdominal pain.  Genitourinary: Negative for dysuria, hematuria and difficulty urinating.  Musculoskeletal: Positive for back pain.  Skin: Positive for rash.  Neurological: Positive for headaches.  Hematological: Does not bruise/bleed easily.  Psychiatric/Behavioral: Negative for confusion.  All other systems reviewed and are negative.   Allergies  Penicillins and Ciprofloxacin  Home Medications   Prior to Admission medications    Medication Sig Start Date End Date Taking? Authorizing Provider  ALPRAZolam (XANAX) 0.25 MG tablet Take 0.25 mg by mouth 4 (four) times daily as needed for anxiety.    Yes Historical Provider, MD  aspirin 325 MG tablet Take 1 tablet (325 mg total) by mouth daily. 04/15/15  Yes Thurnell Lose, MD  atorvastatin (LIPITOR) 20 MG tablet Take 1 tablet (20 mg total) by mouth daily at 6 PM. 04/15/15  Yes Thurnell Lose, MD  citalopram (CELEXA) 20 MG tablet Take 20 mg by mouth daily. 07/04/13  Yes Historical Provider, MD  dextromethorphan-guaiFENesin (MUCINEX DM) 30-600 MG 12hr tablet Take 1 tablet by mouth 2 (two) times daily as needed for cough.    Yes Historical Provider, MD  gabapentin (NEURONTIN) 300 MG capsule Take 300 mg by mouth 3 (three) times daily.   Yes Historical Provider, MD  Insulin Glargine (LANTUS SOLOSTAR) 100 UNIT/ML Solostar Pen Inject 34 Units into the skin at bedtime.   Yes Historical Provider, MD  lisinopril (PRINIVIL,ZESTRIL) 20 MG tablet Take 20 mg by mouth daily.   Yes Historical Provider, MD  metFORMIN (GLUCOPHAGE) 500 MG tablet Take 500 mg by mouth 2 (two) times daily with a meal.   Yes Historical Provider, MD  metoprolol tartrate (LOPRESSOR) 25 MG tablet Take 1 tablet (25 mg total) by mouth 2 (two) times daily. Patient taking differently: Take 12.5 mg by mouth 2 (two) times daily.  05/30/15  Yes Thayer Headings, MD  naproxen (NAPROSYN) 500 MG tablet Take 500 mg by mouth 2 (two) times daily. 03/16/15  Yes Historical Provider, MD  oxyCODONE-acetaminophen (PERCOCET) 5-325 MG per tablet Take 1 tablet by mouth every 8 (eight) hours as needed for severe pain. 12/28/13  Yes Lucia Gaskins, MD  pantoprazole (PROTONIX) 40 MG tablet Take 1 tablet (40 mg total) by mouth daily at 12 noon. Patient taking differently: Take 40 mg by mouth daily.  12/28/13  Yes Richard Cindie Laroche, MD   BP 116/64 mmHg  Pulse 122  Temp(Src) 97.6 F (36.4 C) (Oral)  Resp 13  Ht 5\' 6"  (1.676 m)  Wt 72.576 kg   BMI 25.84 kg/m2  SpO2 100% Physical Exam  Constitutional: She is oriented to person, place, and time. She appears well-developed and well-nourished. No distress.  HENT:  Head: Normocephalic.  Mucous membranes moist   Eyes: Conjunctivae and EOM are normal. Pupils are equal, round, and reactive to light. No scleral icterus.  Cardiovascular: Regular rhythm.   Tachycardic   Pulmonary/Chest: Effort normal. She has wheezes (bilateral ).  Abdominal: Soft. Bowel sounds are normal. She exhibits no distension. There is no tenderness.  Musculoskeletal: She exhibits no edema (edema of the left lower extremity greater than the right).  Neurological: She is alert and oriented to person, place, and time. No cranial nerve deficit. She exhibits normal muscle tone. Coordination normal.  Skin: Skin is warm and dry.  Psychiatric: She has a normal mood and affect. Thought content normal.  Nursing note and vitals reviewed.   ED Course  Procedures  DIAGNOSTIC STUDIES:    Oxygen Saturation is 92-94% on RA, adequate by my interpretation.   COORDINATION OF CARE:  9:18 PM Will order chest xray and blood work. Will administer fluids. Discussed treatment plan with pt at bedside and pt agreed to plan.    Labs Review Labs Reviewed  COMPREHENSIVE METABOLIC PANEL - Abnormal; Notable for the following:    Chloride 96 (*)    CO2 33 (*)    Glucose, Bld 130 (*)    BUN 22 (*)    Albumin 3.2 (*)    ALT 13 (*)    All other components within normal limits  CBC WITH DIFFERENTIAL/PLATELET - Abnormal; Notable for the following:    WBC 11.5 (*)    Hemoglobin 11.8 (*)    RDW 17.9 (*)    Neutro Abs 8.2 (*)    All other components within normal limits  BRAIN NATRIURETIC PEPTIDE - Abnormal; Notable for the following:    B Natriuretic Peptide 961.0 (*)    All other components within normal limits  URINALYSIS, ROUTINE W REFLEX MICROSCOPIC (NOT AT [facility])  I-STAT TROPOININ, ED   Results for orders placed or performed  during the hospital encounter of 08/17/15  Comprehensive metabolic panel  Result Value Ref Range   Sodium 137 135 - 145 mmol/L   Potassium 4.0 3.5 - 5.1 mmol/L   Chloride 96 (L) 101 - 111 mmol/L   CO2 33 (H) 22 - 32 mmol/L   Glucose, Bld 130 (H) 65 - 99 mg/dL   BUN 22 (H) 6 - 20 mg/dL   Creatinine, Ser 0.81 0.44 - 1.00 mg/dL   Calcium 9.0 8.9 - 10.3 mg/dL   Total Protein 7.3 6.5 - 8.1 g/dL   Albumin 3.2 (L) 3.5 - 5.0 g/dL   AST 18 15 - 41 U/L   ALT 13 (L) 14 - 54 U/L   Alkaline Phosphatase 98 38 - 126 U/L   Total Bilirubin 1.0 0.3 - 1.2 mg/dL   GFR calc non Af Amer >60 >60 mL/min   GFR calc Af Amer >60 >60 mL/min   Anion gap 8 5 - 15  CBC with Differential/Platelet  Result Value Ref Range   WBC 11.5 (H) 4.0 - 10.5 K/uL   RBC 4.37 3.87 - 5.11 MIL/uL   Hemoglobin 11.8 (L) 12.0 - 15.0 g/dL   HCT 38.6 36.0 - 46.0 %   MCV 88.3 78.0 - 100.0 fL   MCH 27.0 26.0 - 34.0 pg   MCHC 30.6 30.0 - 36.0 g/dL   RDW 17.9 (H) 11.5 - 15.5 %   Platelets 328 150 - 400 K/uL   Neutrophils Relative % 72 %   Neutro Abs 8.2 (H) 1.7 - 7.7 K/uL   Lymphocytes Relative 17 %   Lymphs Abs 2.0 0.7 - 4.0 K/uL   Monocytes Relative 9 %   Monocytes Absolute 1.0 0.1 - 1.0 K/uL   Eosinophils Relative 2 %   Eosinophils Absolute 0.3 0.0 - 0.7 K/uL   Basophils Relative 0 %   Basophils Absolute 0.0 0.0 - 0.1 K/uL  Urinalysis, Routine w reflex microscopic (not at [facility])  Result Value Ref Range   Color, Urine YELLOW YELLOW   APPearance CLEAR CLEAR   Specific Gravity, Urine 1.020 1.005 - 1.030   pH 6.0 5.0 - 8.0   Glucose, UA NEGATIVE NEGATIVE mg/dL   Hgb urine dipstick NEGATIVE NEGATIVE   Bilirubin Urine NEGATIVE NEGATIVE  Ketones, ur NEGATIVE NEGATIVE mg/dL   Protein, ur NEGATIVE NEGATIVE mg/dL   Nitrite NEGATIVE NEGATIVE   Leukocytes, UA NEGATIVE NEGATIVE  Brain natriuretic peptide  Result Value Ref Range   B Natriuretic Peptide 961.0 (H) 0.0 - 100.0 pg/mL  I-Stat Troponin, ED (not at [facility])  Result Value  Ref Range   Troponin i, poc 0.01 0.00 - 0.08 ng/mL   Comment 3             Imaging Review Dg Chest 2 View  08/17/2015  CLINICAL DATA:  Bilateral leg swelling, shortness of breath and nonproductive cough for 3 days. History of COPD, hypertension, diabetes. EXAM: CHEST  2 VIEW COMPARISON:  Chest radiograph April 13, 2015 FINDINGS: The cardiac silhouette is mildly enlarged, increased from prior imaging. Calcified aortic knob. Pulmonary vascular congestion and mild interstitial prominence. Small to moderate layering LEFT pleural effusion. No focal consolidation. No pneumothorax. 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 Mild degenerative change of the thoracic spine. IMPRESSION: Increasing cardiomegaly, with findings of interstitial pulmonary edema. Small to moderate LEFT pleural effusion. Electronically Signed   By: Elon Alas M.D.   On: 08/17/2015 23:10   I have personally reviewed and evaluated these images and lab results as part of my medical decision-making.   EKG Interpretation   Date/Time:  Thursday August 17 2015 21:11:37 EST Ventricular Rate:  120 PR Interval:    QRS Duration: 117 QT Interval:  342 QTC Calculation: 483 R Axis:   -104 Text Interpretation:  Junctional tachycardia Nonspecific IVCD with LAD Low  voltage, precordial leads Abnormal lateral Q waves Probable anteroseptal  infarct, old No significant change since last tracing Confirmed by  Horacio Werth  MD, Girl Schissler [phone]) on 08/17/2015 9:18:03 PM      MDM   Final diagnoses:  Tachycardia  Leg swelling  Cardiomegaly    Patient presented with a complaint of bilateral leg swelling. Started on Tuesday. Patient feels like shows swelling up in her abdominal wall and chest wall. Patient noticed some mild shortness of breath today patient does have oxygen at home available but she's not currently using it. Patient only takes Lopressor twice a day did not have her evening dose. Patient's heart rate  sinus tach around 122 as been persistent. Previous visits heart rates also been in approximately that same range.  Chest x-ray does not show florid pulmonary edema but does raise some concerns for some interstitial pulmonary edema. Also has shows evidence of increasing cardiomegaly. There is also a small moderate left pleural effusion. Labs without significant abnormalities other than a mild leukocytosis renal function is not consistent with any renal failure. Patient is known to have a history of COPD and electrolytes seem to reflect that. Urinalysis negative. Patient's BNP elevated at 900. Patient did have a echocardiogram done in July which raise some concerns for some systolic dysfunction.  Patient's EKG showed a junctional tachycardia without significant changes compared old ones.  Point care troponin has been ordered and the results are pending on that.   Patient's troponin is negative but she still has a persistent tachycardia has the pleural effusion evidence of some mild pulmonary edema. Patient received 40 mg of Lasix here IV blood pressure has improved with her Lopressor dose. We'll discuss with hospitalist regarding admission. Patient still with some feelings of shortness of breath but no significant hypoxia.  I personally performed the services described in this documentation, which was scribed in my presence. The recorded information has been reviewed and is  accurate.      Fredia Sorrow, MD 08/18/15 0010  Fredia Sorrow, MD 08/18/15 [phone]

## 2015-08-18 ENCOUNTER — Observation Stay (HOSPITAL_COMMUNITY): Payer: Medicare Other

## 2015-08-18 DIAGNOSIS — Z87891 Personal history of nicotine dependence: Secondary | ICD-10-CM | POA: Diagnosis not present

## 2015-08-18 DIAGNOSIS — J410 Simple chronic bronchitis: Secondary | ICD-10-CM

## 2015-08-18 DIAGNOSIS — T380X5A Adverse effect of glucocorticoids and synthetic analogues, initial encounter: Secondary | ICD-10-CM | POA: Diagnosis not present

## 2015-08-18 DIAGNOSIS — I509 Heart failure, unspecified: Secondary | ICD-10-CM | POA: Diagnosis present

## 2015-08-18 DIAGNOSIS — S81009A Unspecified open wound, unspecified knee, initial encounter: Secondary | ICD-10-CM | POA: Diagnosis present

## 2015-08-18 DIAGNOSIS — Z794 Long term (current) use of insulin: Secondary | ICD-10-CM | POA: Diagnosis not present

## 2015-08-18 DIAGNOSIS — R0600 Dyspnea, unspecified: Secondary | ICD-10-CM | POA: Diagnosis not present

## 2015-08-18 DIAGNOSIS — Z79891 Long term (current) use of opiate analgesic: Secondary | ICD-10-CM | POA: Diagnosis not present

## 2015-08-18 DIAGNOSIS — Z823 Family history of stroke: Secondary | ICD-10-CM | POA: Diagnosis not present

## 2015-08-18 DIAGNOSIS — Z8673 Personal history of transient ischemic attack (TIA), and cerebral infarction without residual deficits: Secondary | ICD-10-CM | POA: Diagnosis not present

## 2015-08-18 DIAGNOSIS — J449 Chronic obstructive pulmonary disease, unspecified: Secondary | ICD-10-CM | POA: Diagnosis present

## 2015-08-18 DIAGNOSIS — E1165 Type 2 diabetes mellitus with hyperglycemia: Secondary | ICD-10-CM | POA: Diagnosis present

## 2015-08-18 DIAGNOSIS — R0602 Shortness of breath: Secondary | ICD-10-CM

## 2015-08-18 DIAGNOSIS — S91009A Unspecified open wound, unspecified ankle, initial encounter: Secondary | ICD-10-CM

## 2015-08-18 DIAGNOSIS — I10 Essential (primary) hypertension: Secondary | ICD-10-CM | POA: Diagnosis not present

## 2015-08-18 DIAGNOSIS — S81809A Unspecified open wound, unspecified lower leg, initial encounter: Secondary | ICD-10-CM

## 2015-08-18 DIAGNOSIS — J810 Acute pulmonary edema: Secondary | ICD-10-CM | POA: Diagnosis present

## 2015-08-18 DIAGNOSIS — I5023 Acute on chronic systolic (congestive) heart failure: Secondary | ICD-10-CM | POA: Diagnosis not present

## 2015-08-18 DIAGNOSIS — Z79899 Other long term (current) drug therapy: Secondary | ICD-10-CM | POA: Diagnosis not present

## 2015-08-18 DIAGNOSIS — Z7982 Long term (current) use of aspirin: Secondary | ICD-10-CM | POA: Diagnosis not present

## 2015-08-18 DIAGNOSIS — Z9981 Dependence on supplemental oxygen: Secondary | ICD-10-CM | POA: Diagnosis not present

## 2015-08-18 DIAGNOSIS — I471 Supraventricular tachycardia: Secondary | ICD-10-CM | POA: Diagnosis present

## 2015-08-18 DIAGNOSIS — Z8249 Family history of ischemic heart disease and other diseases of the circulatory system: Secondary | ICD-10-CM | POA: Diagnosis not present

## 2015-08-18 DIAGNOSIS — I110 Hypertensive heart disease with heart failure: Secondary | ICD-10-CM | POA: Diagnosis present

## 2015-08-18 LAB — GLUCOSE, CAPILLARY
GLUCOSE-CAPILLARY: 125 mg/dL — AB (ref 65–99)
GLUCOSE-CAPILLARY: 84 mg/dL (ref 65–99)
GLUCOSE-CAPILLARY: 315 mg/dL — AB (ref 65–99)
Glucose-Capillary: 86 mg/dL (ref 65–99)
Glucose-Capillary: 140 mg/dL — ABNORMAL HIGH (ref 65–99)

## 2015-08-18 LAB — I-STAT TROPONIN, ED: TROPONIN I, POC: 0.01 ng/mL (ref 0.00–0.08)

## 2015-08-18 MED ORDER — FUROSEMIDE 10 MG/ML IJ SOLN
40.0000 mg | Freq: Every day | INTRAMUSCULAR | Status: DC
  Administered 2015-08-18: 40 mg via INTRAVENOUS
  Filled 2015-08-18: qty 4

## 2015-08-18 MED ORDER — METOPROLOL TARTRATE 25 MG PO TABS
25.0000 mg | ORAL_TABLET | Freq: Two times a day (BID) | ORAL | Status: DC
  Administered 2015-08-18 – 2015-08-23 (×11): 25 mg via ORAL
  Filled 2015-08-18 (×11): qty 1

## 2015-08-18 MED ORDER — FUROSEMIDE 10 MG/ML IJ SOLN
40.0000 mg | Freq: Two times a day (BID) | INTRAMUSCULAR | Status: DC
  Administered 2015-08-18 – 2015-08-23 (×10): 40 mg via INTRAVENOUS
  Filled 2015-08-18 (×10): qty 4

## 2015-08-18 MED ORDER — LISINOPRIL 10 MG PO TABS
20.0000 mg | ORAL_TABLET | Freq: Every day | ORAL | Status: DC
  Administered 2015-08-18 – 2015-08-23 (×6): 20 mg via ORAL
  Filled 2015-08-18 (×6): qty 2

## 2015-08-18 MED ORDER — SODIUM CHLORIDE 0.9 % IJ SOLN
3.0000 mL | INTRAMUSCULAR | Status: DC | PRN
  Administered 2015-08-21: 3 mL via INTRAVENOUS
  Filled 2015-08-18: qty 3

## 2015-08-18 MED ORDER — NICOTINE 21 MG/24HR TD PT24
21.0000 mg | MEDICATED_PATCH | Freq: Every day | TRANSDERMAL | Status: DC
  Administered 2015-08-18 – 2015-08-23 (×6): 21 mg via TRANSDERMAL
  Filled 2015-08-18 (×6): qty 1

## 2015-08-18 MED ORDER — INSULIN ASPART 100 UNIT/ML ~~LOC~~ SOLN
0.0000 [IU] | Freq: Three times a day (TID) | SUBCUTANEOUS | Status: DC
  Administered 2015-08-18: 1 [IU] via SUBCUTANEOUS
  Administered 2015-08-19: 7 [IU] via SUBCUTANEOUS
  Administered 2015-08-19: 5 [IU] via SUBCUTANEOUS

## 2015-08-18 MED ORDER — METHYLPREDNISOLONE SODIUM SUCC 125 MG IJ SOLR
125.0000 mg | Freq: Four times a day (QID) | INTRAMUSCULAR | Status: DC
  Administered 2015-08-18 – 2015-08-23 (×21): 125 mg via INTRAVENOUS
  Filled 2015-08-18 (×21): qty 2

## 2015-08-18 MED ORDER — FUROSEMIDE 10 MG/ML IJ SOLN: 40.0000 mg | Freq: Two times a day (BID) | INTRAMUSCULAR | Status: DC

## 2015-08-18 MED ORDER — GABAPENTIN 300 MG PO CAPS
300.0000 mg | ORAL_CAPSULE | Freq: Three times a day (TID) | ORAL | Status: DC
  Administered 2015-08-18 – 2015-08-23 (×16): 300 mg via ORAL
  Filled 2015-08-18 (×16): qty 1

## 2015-08-18 MED ORDER — POTASSIUM CHLORIDE CRYS ER 20 MEQ PO TBCR
20.0000 meq | EXTENDED_RELEASE_TABLET | Freq: Every day | ORAL | Status: DC
Start: 2015-08-18 — End: 2015-08-23
  Administered 2015-08-18 – 2015-08-23 (×6): 20 meq via ORAL
  Filled 2015-08-18 (×7): qty 1

## 2015-08-18 MED ORDER — SODIUM CHLORIDE 0.9 % IV SOLN: 250.0000 mL | INTRAVENOUS | Status: DC | PRN

## 2015-08-18 MED ORDER — INSULIN GLARGINE 100 UNIT/ML ~~LOC~~ SOLN
34.0000 [IU] | Freq: Every day | SUBCUTANEOUS | Status: DC
  Administered 2015-08-18 – 2015-08-19 (×3): 34 [IU] via SUBCUTANEOUS
  Filled 2015-08-18 (×4): qty 0.34

## 2015-08-18 MED ORDER — IPRATROPIUM-ALBUTEROL 0.5-2.5 (3) MG/3ML IN SOLN
3.0000 mL | RESPIRATORY_TRACT | Status: DC
  Administered 2015-08-18 – 2015-08-22 (×19): 3 mL via RESPIRATORY_TRACT
  Filled 2015-08-18 (×20): qty 3

## 2015-08-18 MED ORDER — OXYCODONE-ACETAMINOPHEN 5-325 MG PO TABS
1.0000 | ORAL_TABLET | Freq: Three times a day (TID) | ORAL | Status: DC | PRN
  Administered 2015-08-18 – 2015-08-19 (×3): 1 via ORAL
  Filled 2015-08-18 (×3): qty 1

## 2015-08-18 MED ORDER — CITALOPRAM HYDROBROMIDE 20 MG PO TABS
20.0000 mg | ORAL_TABLET | Freq: Every day | ORAL | Status: DC
  Administered 2015-08-18 – 2015-08-23 (×6): 20 mg via ORAL
  Filled 2015-08-18 (×6): qty 1

## 2015-08-18 MED ORDER — ACETAMINOPHEN 325 MG PO TABS
650.0000 mg | ORAL_TABLET | ORAL | Status: DC | PRN
  Administered 2015-08-19: 650 mg via ORAL
  Filled 2015-08-18: qty 2

## 2015-08-18 MED ORDER — ATORVASTATIN CALCIUM 20 MG PO TABS
20.0000 mg | ORAL_TABLET | Freq: Every day | ORAL | Status: DC
  Administered 2015-08-18 – 2015-08-22 (×4): 20 mg via ORAL
  Filled 2015-08-18 (×4): qty 1

## 2015-08-18 MED ORDER — ONDANSETRON HCL 4 MG/2ML IJ SOLN
4.0000 mg | Freq: Four times a day (QID) | INTRAMUSCULAR | Status: DC | PRN
  Administered 2015-08-20 – 2015-08-21 (×4): 4 mg via INTRAVENOUS
  Filled 2015-08-18 (×5): qty 2

## 2015-08-18 MED ORDER — ALPRAZOLAM 0.25 MG PO TABS
0.2500 mg | ORAL_TABLET | Freq: Four times a day (QID) | ORAL | Status: DC | PRN
  Administered 2015-08-18 – 2015-08-23 (×7): 0.25 mg via ORAL
  Filled 2015-08-18 (×7): qty 1

## 2015-08-18 MED ORDER — DM-GUAIFENESIN ER 30-600 MG PO TB12: 1.0000 | ORAL_TABLET | Freq: Two times a day (BID) | ORAL | Status: DC | PRN

## 2015-08-18 MED ORDER — ASPIRIN 325 MG PO TABS
325.0000 mg | ORAL_TABLET | Freq: Every day | ORAL | Status: DC
  Administered 2015-08-18 – 2015-08-23 (×6): 325 mg via ORAL
  Filled 2015-08-18 (×6): qty 1

## 2015-08-18 MED ORDER — NAPROXEN 250 MG PO TABS
500.0000 mg | ORAL_TABLET | Freq: Two times a day (BID) | ORAL | Status: DC
  Administered 2015-08-18 – 2015-08-23 (×12): 500 mg via ORAL
  Filled 2015-08-18 (×12): qty 2

## 2015-08-18 MED ORDER — SODIUM CHLORIDE 0.9 % IJ SOLN
3.0000 mL | Freq: Two times a day (BID) | INTRAMUSCULAR | Status: DC
  Administered 2015-08-18 – 2015-08-23 (×11): 3 mL via INTRAVENOUS

## 2015-08-18 NOTE — Progress Notes (Signed)
Patient has been smoking 1-1/2-2 packs per day has moderate inspiratory expiratory wheeze I believe this is more chronic bronchitis with mild left ventricular dysfunction and volume overload will add nebulizers and nicotine patch and diuresis [name] [id number] DOB: 10-09-1950 DOA: 08/17/2015 PCP: Maricela Curet, MD             Physical Exam: Blood pressure 111/65, pulse 110, temperature 98.4 F (36.9 C), temperature source Oral, resp. rate 16, height 5' 6"  (1.676 m), weight 202 lb 3.2 oz (91.717 kg), SpO2 92 %. neck no JVD no carotid bruits no thyromegaly lungs show moderate intrauterine respiratory wheezes scattered rhonchi diminished breath sounds in the bases no rales audible heart regular rhythm no S3-4 no heaves thrills rubs some mild anasarca in the body lower back extremities 1-2+ pedal edema   Investigations:  No results found for this or any previous visit (from the past 240 hour(s)).   Basic Metabolic Panel:  Recent Labs  08/17/15 2145  NA 137  K 4.0  CL 96*  CO2 33*  GLUCOSE 130*  BUN 22*  CREATININE 0.81  CALCIUM 9.0   Liver Function Tests:  Recent Labs  08/17/15 2145  AST 18  ALT 13*  ALKPHOS 98  BILITOT 1.0  PROT 7.3  ALBUMIN 3.2*     CBC:  Recent Labs  08/17/15 2145  WBC 11.5*  NEUTROABS 8.2*  HGB 11.8*  HCT 38.6  MCV 88.3  PLT 328    Dg Chest 2 View  08/17/2015  CLINICAL DATA:  Bilateral leg swelling, shortness of breath and nonproductive cough for 3 days. History of COPD, hypertension, diabetes. EXAM: CHEST  2 VIEW COMPARISON:  Chest radiograph April 13, 2015 FINDINGS: The cardiac silhouette is mildly enlarged, increased from prior imaging. Calcified aortic knob. Pulmonary vascular congestion and mild interstitial prominence. Small to moderate layering LEFT pleural effusion. No focal consolidation. No pneumothorax. 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 Mild degenerative change of the thoracic spine.  IMPRESSION: Increasing cardiomegaly, with findings of interstitial pulmonary edema. Small to moderate LEFT pleural effusion. Electronically Signed   By: Elon Alas M.D.   On: 08/17/2015 23:10      Medications:   Impression:  Principal Problem:   CHF exacerbation (HCC) Active Problems:   COPD (chronic obstructive pulmonary disease) (HCC)   Essential hypertension, benign   Diabetes (HCC)   SVT (supraventricular tachycardia) (HCC)   SOB (shortness of breath)   Open wound of knee, leg (except thigh), and ankle     Plan: Lasix 20 mg IV every 12 hours be met daily Solu-Medrol 125 IV every 8 hours DuoNeb nebulizers every 6 hours nicotine patch  Consultants:    Procedures   Antibiotics:                   Code Status:  Family Communication:  Spoke with sister and patient  Disposition Plan see plan above  Time spent: 30 minutes     [place] M   08/18/2015, 12:39 PM

## 2015-08-18 NOTE — Care Management Note (Signed)
Case Management Note  Patient Details  Name: [name] MRN: [id number] Date of Birth: [birth year]  Subjective/Objective:                  Pt admitted from home with CHF. Pt lives with her husband and will return home at discharge. Pt is independent with ADL's. Pt does have a cane and walker if needed along with neb machine.  Action/Plan: Will need home O2 assessment prior to discharge.  Expected Discharge Date:                  Expected Discharge Plan:  Home/Self Care  In-House Referral:  NA  Discharge planning Services  CM Consult  Post Acute Care Choice:  NA Choice offered to:  NA  DME Arranged:    DME Agency:     HH Arranged:    HH Agency:     Status of Service:  Completed, signed off  Medicare Important Message Given:  Yes Date Medicare IM Given:    Medicare IM give by:    Date Additional Medicare IM Given:    Additional Medicare Important Message give by:     If discussed at [place] of Stay Meetings, dates discussed:    Additional Comments:  Joylene Draft, RN 08/18/2015, 3:37 PM

## 2015-08-18 NOTE — ED Notes (Signed)
Patient assisted to restroom, tolerated well 

## 2015-08-18 NOTE — Care Management Important Message (Signed)
Important Message  Patient Details  Name: [name] MRN: [id number] Date of Birth: May 25, 1951   Medicare Important Message Given:  Yes    Joylene Draft, RN 08/18/2015, 3:36 PM

## 2015-08-18 NOTE — H&P (Signed)
PCP:   Maricela Curet, MD   Chief Complaint:  Marin Comment edema, sob  HPI: 64 yo female h/o recent fasciotomy/ischemia/vascular surgery to right leg, htn, copd, htn comes in with several days of ble edema and swelling in her pannus with associated sob.  No fevers.  No cough.  Pt very poor historian.  No abdominal pain.  No rashes.  Her wound to her leg has been well healing.  She has no h/o chf.  She denies any pain anywhere.  No orthopnea or pnd.  Referred for admission for new chf.  Review of Systems:  Positive and negative as per HPI otherwise all other systems are negative  Past Medical History: Past Medical History  Diagnosis Date  . Hypertension   . Diabetes mellitus without complication ([place])   . COPD (chronic obstructive pulmonary disease) ([place])   . Stroke ([place])   . Chronic back pain    Past Surgical History  Procedure Laterality Date  . Back fusion    . Cholecystectomy    . Foot surgery Bilateral   . Cardiac catheterization    . Peripheral vascular catheterization N/A 04/10/2015    Procedure: Abdominal Aortogram w/Lower Extremity;  Surgeon: Conrad Roanoke, MD;  Location: [facility];  Service: Cardiovascular;  Laterality: N/A;  . Thrombectomy femoral artery Right 04/10/2015    Procedure: THROMBECTOMY RIGHT TIBIAL  ARTERY WITH VEIN PATCH ANGIOPLASTY;  Surgeon: Elam Dutch, MD;  Location: [facility];  Service: Vascular;  Laterality: Right;  . Fasciotomy Right 04/10/2015    Procedure: FOUR COMPARTMENT FASCIOTOMY;  Surgeon: Elam Dutch, MD;  Location: [facility] OR;  Service: Vascular;  Laterality: Right;  . Femoral-popliteal bypass graft Right 04/10/2015    Procedure: RIGHT  FEMORAL-BELOW THE KNEE POPLITEAL ARTERY BYPASS GRAFT USING 6 MM X 80 CM PROPATEN GRAFT;  Surgeon: Elam Dutch, MD;  Location: [facility];  Service: Vascular;  Laterality: Right;  . Intraoperative arteriogram Right 04/10/2015    Procedure: INTRA OPERATIVE ARTERIOGRAM;  Surgeon: Elam Dutch, MD;  Location:  [facility] OR;  Service: Vascular;  Laterality: Right;  . Carotid endarterectomy      Right CE[facility] Dr Kellie Simmering 2010    Medications: Prior to Admission medications   Medication Sig Start Date End Date Taking? Authorizing Provider  ALPRAZolam (XANAX) 0.25 MG tablet Take 0.25 mg by mouth 4 (four) times daily as needed for anxiety.    Yes Historical Provider, MD  aspirin 325 MG tablet Take 1 tablet (325 mg total) by mouth daily. 04/15/15  Yes Thurnell Lose, MD  atorvastatin (LIPITOR) 20 MG tablet Take 1 tablet (20 mg total) by mouth daily at 6 PM. 04/15/15  Yes Thurnell Lose, MD  citalopram (CELEXA) 20 MG tablet Take 20 mg by mouth daily. 07/04/13  Yes Historical Provider, MD  dextromethorphan-guaiFENesin (MUCINEX DM) 30-600 MG 12hr tablet Take 1 tablet by mouth 2 (two) times daily as needed for cough.    Yes Historical Provider, MD  gabapentin (NEURONTIN) 300 MG capsule Take 300 mg by mouth 3 (three) times daily.   Yes Historical Provider, MD  Insulin Glargine (LANTUS SOLOSTAR) 100 UNIT/ML Solostar Pen Inject 34 Units into the skin at bedtime.   Yes Historical Provider, MD  lisinopril (PRINIVIL,ZESTRIL) 20 MG tablet Take 20 mg by mouth daily.   Yes Historical Provider, MD  metFORMIN (GLUCOPHAGE) 500 MG tablet Take 500 mg by mouth 2 (two) times daily with a meal.   Yes Historical Provider, MD  metoprolol tartrate (LOPRESSOR)  25 MG tablet Take 1 tablet (25 mg total) by mouth 2 (two) times daily. Patient taking differently: Take 12.5 mg by mouth 2 (two) times daily.  05/30/15  Yes Thayer Headings, MD  naproxen (NAPROSYN) 500 MG tablet Take 500 mg by mouth 2 (two) times daily. 03/16/15  Yes Historical Provider, MD  oxyCODONE-acetaminophen (PERCOCET) 5-325 MG per tablet Take 1 tablet by mouth every 8 (eight) hours as needed for severe pain. 12/28/13  Yes Lucia Gaskins, MD  pantoprazole (PROTONIX) 40 MG tablet Take 1 tablet (40 mg total) by mouth daily at 12 noon. Patient taking differently: Take 40 mg by mouth  daily.  12/28/13  Yes Lucia Gaskins, MD    Allergies:   Allergies  Allergen Reactions  . Penicillins Other (See Comments)    Unknown reaction  . Ciprofloxacin Hives    Social History:  reports that she quit smoking about 7 weeks ago. Her smoking use included Cigarettes. She quit after 40 years of use. She has never used smokeless tobacco. She reports that she does not drink alcohol or use illicit drugs.  Family History: Family History  Problem Relation Age of Onset  . Cancer Father   . Heart attack Sister   . Stroke Sister   . Hypertension Sister   . Hypertension Brother     Physical Exam: Filed Vitals:   08/18/15 0028 08/18/15 0030 08/18/15 0100 08/18/15 0134  BP: 144/91 126/80 119/73 123/71  Pulse: 128 124 117 117  Temp:    97.8 F (36.6 C)  TempSrc:    Oral  Resp: 18 18 16 19   Height:    5\' 6"  (1.676 m)  Weight:    91.717 kg (202 lb 3.2 oz)  SpO2: 93% 94% 93% 93%   General appearance: alert, cooperative and no distress Head: Normocephalic, without obvious abnormality, atraumatic Eyes: negative Nose: Nares normal. Septum midline. Mucosa normal. No drainage or sinus tenderness. Neck: no JVD and supple, symmetrical, trachea midline Lungs: clear to auscultation bilaterally Heart: regular rate and rhythm, S1, S2 normal, no murmur, click, rub or gallop Abdomen: soft, non-tender; bowel sounds normal; no masses,  no organomegaly Extremities: edema 2-3 + up to bilateral knees, edema dependent in pannus Pulses: 2+ and symmetric Skin: Skin color, texture, turgor normal. No rashes or lesions x well healing wound to rle Neurologic: Grossly normal    Labs on Admission:   Recent Labs  08/17/15 2145  NA 137  K 4.0  CL 96*  CO2 33*  GLUCOSE 130*  BUN 22*  CREATININE 0.81  CALCIUM 9.0    Recent Labs  08/17/15 2145  AST 18  ALT 13*  ALKPHOS 98  BILITOT 1.0  PROT 7.3  ALBUMIN 3.2*    Recent Labs  08/17/15 2145  WBC 11.5*  NEUTROABS 8.2*  HGB 11.8*   HCT 38.6  MCV 88.3  PLT 328   Radiological Exams on Admission: Dg Chest 2 View  08/17/2015  CLINICAL DATA:  Bilateral leg swelling, shortness of breath and nonproductive cough for 3 days. History of COPD, hypertension, diabetes. EXAM: CHEST  2 VIEW COMPARISON:  Chest radiograph April 13, 2015 FINDINGS: The cardiac silhouette is mildly enlarged, increased from prior imaging. Calcified aortic knob. Pulmonary vascular congestion and mild interstitial prominence. Small to moderate layering LEFT pleural effusion. No focal consolidation. No pneumothorax. 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 Mild degenerative change of the thoracic spine. IMPRESSION: Increasing cardiomegaly, with findings of interstitial pulmonary edema. Small to moderate LEFT  pleural effusion. Electronically Signed   By: Elon Alas M.D.   On: 08/17/2015 23:10    Assessment/Plan  64 yo female with new onset chf  Principal Problem:   CHF exacerbation ([place])- chf pathway.  Place on lasix 40mg  iv daily.  Echo in am.  Vitals stable, normal oxygen sats.  obs on tele.  Active Problems:   COPD (chronic obstructive pulmonary disease) (HCC)- stable   Essential hypertension, benign- noted   Diabetes ([place])- noted, ssi   SVT (supraventricular tachycardia) ([place])- monitor HR, has rresolved in past with valsalva   SOB (shortness of breath)- due to above   Open wound of knee, leg (except thigh), and ankle- noted, appears well healing, noninfected  obs on tele.  Full code.  Elevate legs.  Bayyinah Dukeman A 08/18/2015, 2:56 AM

## 2015-08-18 NOTE — Progress Notes (Signed)
**Note De-Identified Halley Shepheard Obfuscation** Per oxygen order to maintain SAT greater than 92% patient was placed on 2 L [place]. RRT to continue to monitor.

## 2015-08-19 LAB — BASIC METABOLIC PANEL
Anion gap: 9 (ref 5–15)
BUN: 28 mg/dL — AB (ref 6–20)
CALCIUM: 9 mg/dL (ref 8.9–10.3)
CO2: 33 mmol/L — AB (ref 22–32)
Chloride: 94 mmol/L — ABNORMAL LOW (ref 101–111)
Creatinine, Ser: 1.21 mg/dL — ABNORMAL HIGH (ref 0.44–1.00)
GFR calc Af Amer: 53 mL/min — ABNORMAL LOW (ref >60)
GFR, EST NON AFRICAN AMERICAN: 46 mL/min — AB (ref >60)
GLUCOSE: 303 mg/dL — AB (ref 65–99)
POTASSIUM: 4.8 mmol/L (ref 3.5–5.1)
Sodium: 136 mmol/L (ref 135–145)

## 2015-08-19 LAB — GLUCOSE, CAPILLARY
GLUCOSE-CAPILLARY: 338 mg/dL — AB (ref 65–99)
Glucose-Capillary: 283 mg/dL — ABNORMAL HIGH (ref 65–99)
Glucose-Capillary: 317 mg/dL — ABNORMAL HIGH (ref 65–99)

## 2015-08-19 LAB — HEMOGLOBIN A1C
HEMOGLOBIN A1C: 7.8 % — AB (ref 4.8–5.6)
MEAN PLASMA GLUCOSE: 177 mg/dL

## 2015-08-19 MED ORDER — DIPHENHYDRAMINE HCL 25 MG PO CAPS
50.0000 mg | ORAL_CAPSULE | Freq: Three times a day (TID) | ORAL | Status: DC | PRN
  Administered 2015-08-19 – 2015-08-20 (×3): 50 mg via ORAL
  Filled 2015-08-19 (×3): qty 2

## 2015-08-19 MED ORDER — INSULIN ASPART 100 UNIT/ML ~~LOC~~ SOLN
0.0000 [IU] | Freq: Every day | SUBCUTANEOUS | Status: DC
  Administered 2015-08-19: 5 [IU] via SUBCUTANEOUS
  Administered 2015-08-20 – 2015-08-21 (×2): 3 [IU] via SUBCUTANEOUS
  Administered 2015-08-22: 2 [IU] via SUBCUTANEOUS

## 2015-08-19 MED ORDER — OXYCODONE-ACETAMINOPHEN 5-325 MG PO TABS
1.0000 | ORAL_TABLET | ORAL | Status: DC | PRN
  Administered 2015-08-19 – 2015-08-22 (×11): 1 via ORAL
  Filled 2015-08-19 (×10): qty 1

## 2015-08-19 MED ORDER — VARENICLINE TARTRATE 1 MG PO TABS
0.5000 mg | ORAL_TABLET | Freq: Two times a day (BID) | ORAL | Status: DC
  Administered 2015-08-19 – 2015-08-21 (×5): 0.5 mg via ORAL
  Administered 2015-08-21: 10:00:00 via ORAL
  Administered 2015-08-22 – 2015-08-23 (×3): 0.5 mg via ORAL
  Filled 2015-08-19 (×13): qty 1

## 2015-08-19 MED ORDER — INSULIN ASPART 100 UNIT/ML ~~LOC~~ SOLN
0.0000 [IU] | Freq: Three times a day (TID) | SUBCUTANEOUS | Status: DC
  Administered 2015-08-19: 11 [IU] via SUBCUTANEOUS
  Administered 2015-08-20 (×2): 5 [IU] via SUBCUTANEOUS
  Administered 2015-08-20: 11 [IU] via SUBCUTANEOUS
  Administered 2015-08-21: 5 [IU] via SUBCUTANEOUS
  Administered 2015-08-21: 2 [IU] via SUBCUTANEOUS
  Administered 2015-08-21: 4 [IU] via SUBCUTANEOUS
  Administered 2015-08-22: 8 [IU] via SUBCUTANEOUS
  Administered 2015-08-22: 11 [IU] via SUBCUTANEOUS
  Administered 2015-08-22 – 2015-08-23 (×2): 5 [IU] via SUBCUTANEOUS
  Administered 2015-08-23: 11 [IU] via SUBCUTANEOUS

## 2015-08-19 NOTE — Progress Notes (Signed)
2-D echo reveals normal systolic function with no regional wall motion abnormalities noted [name] [id number] DOB: December 26, 1950 DOA: 08/17/2015 PCP: Maricela Curet, MD             Physical Exam: Blood pressure 133/77, pulse 120, temperature 97.7 F (36.5 C), temperature source Oral, resp. rate 18, height 5\' 6"  (1.676 m), weight 200 lb (90.719 kg), SpO2 94 %. no JVD no carotid bruits no thyromegaly lungs bilateral moderate intrauterine history wheeze scattered coarse rhonchi bilaterally diminished breath sounds in the bases heart regular rhythm no S3-S4 no heaves thrills rubs   Investigations:  No results found for this or any previous visit (from the past 240 hour(s)).   Basic Metabolic Panel:  Recent Labs  08/17/15 2145 08/19/15 0556  NA 137 136  K 4.0 4.8  CL 96* 94*  CO2 33* 33*  GLUCOSE 130* 303*  BUN 22* 28*  CREATININE 0.81 1.21*  CALCIUM 9.0 9.0   Liver Function Tests:  Recent Labs  08/17/15 2145  AST 18  ALT 13*  ALKPHOS 98  BILITOT 1.0  PROT 7.3  ALBUMIN 3.2*     CBC:  Recent Labs  08/17/15 2145  WBC 11.5*  NEUTROABS 8.2*  HGB 11.8*  HCT 38.6  MCV 88.3  PLT 328    Dg Chest 2 View  08/17/2015  CLINICAL DATA:  Bilateral leg swelling, shortness of breath and nonproductive cough for 3 days. History of COPD, hypertension, diabetes. EXAM: CHEST  2 VIEW COMPARISON:  Chest radiograph April 13, 2015 FINDINGS: The cardiac silhouette is mildly enlarged, increased from prior imaging. Calcified aortic knob. Pulmonary vascular congestion and mild interstitial prominence. Small to moderate layering LEFT pleural effusion. No focal consolidation. No pneumothorax. 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 Mild degenerative change of the thoracic spine. IMPRESSION: Increasing cardiomegaly, with findings of interstitial pulmonary edema. Small to moderate LEFT pleural effusion. Electronically Signed   By: Elon Alas M.D.   On:  08/17/2015 23:10      Medications:   Impression:  Principal Problem:   CHF exacerbation (HCC) Active Problems:   COPD (chronic obstructive pulmonary disease) (HCC)   Essential hypertension, benign   Diabetes (HCC)   SVT (supraventricular tachycardia) (HCC)   Pulmonary edema   SOB (shortness of breath)   Open wound of knee, leg (except thigh), and ankle     Plan: Continue Solu-Medrol IV DuoNeb nebulizer every 4 hours add Chantix 0.5 by mouth twice a day  Consultants:    Procedures   Antibiotics:                  Code Status: Full  Family Communication:    Disposition Plan   Time spent: 30 minutes   LOS: 1 day   [place] M   08/19/2015, 12:20 PM

## 2015-08-20 LAB — GLUCOSE, CAPILLARY
GLUCOSE-CAPILLARY: 224 mg/dL — AB (ref 65–99)
GLUCOSE-CAPILLARY: 324 mg/dL — AB (ref 65–99)
Glucose-Capillary: 362 mg/dL — ABNORMAL HIGH (ref 65–99)
Glucose-Capillary: 247 mg/dL — ABNORMAL HIGH (ref 65–99)
Glucose-Capillary: 279 mg/dL — ABNORMAL HIGH (ref 65–99)

## 2015-08-20 LAB — BASIC METABOLIC PANEL
ANION GAP: 11 (ref 5–15)
BUN: 39 mg/dL — ABNORMAL HIGH (ref 6–20)
CALCIUM: 9.3 mg/dL (ref 8.9–10.3)
CO2: 34 mmol/L — AB (ref 22–32)
Chloride: 91 mmol/L — ABNORMAL LOW (ref 101–111)
Creatinine, Ser: 1.22 mg/dL — ABNORMAL HIGH (ref 0.44–1.00)
GFR, EST AFRICAN AMERICAN: 53 mL/min — AB (ref >60)
GFR, EST NON AFRICAN AMERICAN: 45 mL/min — AB (ref >60)
Glucose, Bld: 242 mg/dL — ABNORMAL HIGH (ref 65–99)
POTASSIUM: 4.8 mmol/L (ref 3.5–5.1)
Sodium: 136 mmol/L (ref 135–145)

## 2015-08-20 MED ORDER — INSULIN GLARGINE 100 UNIT/ML ~~LOC~~ SOLN
25.0000 [IU] | Freq: Every day | SUBCUTANEOUS | Status: DC
  Administered 2015-08-20 – 2015-08-23 (×4): 25 [IU] via SUBCUTANEOUS
  Filled 2015-08-20 (×5): qty 0.25

## 2015-08-20 MED ORDER — INSULIN GLARGINE 100 UNIT/ML ~~LOC~~ SOLN
35.0000 [IU] | Freq: Every day | SUBCUTANEOUS | Status: DC
  Administered 2015-08-20 – 2015-08-22 (×2): 35 [IU] via SUBCUTANEOUS
  Filled 2015-08-20 (×4): qty 0.35

## 2015-08-20 NOTE — Progress Notes (Signed)
Patient has not been receiving at bedtime Lantus will reinstitute 35 units at bedtime and 25 units in a.m. now due to hyperglycemia secondary to steroid HARMONII MUSTOE [id number] DOB: June 08, 1951 DOA: 08/17/2015 PCP: Maricela Curet, MD             Physical Exam: Blood pressure 115/75, pulse 128, temperature 98.4 F (36.9 C), temperature source Oral, resp. rate 20, height 5\' 6"  (1.676 m), weight 201 lb 1 oz (91.2 kg), SpO2 95 %. lungs show moderate inspiratory and expiratory wheezes scattered rhonchi no rales appreciable heart regular rhythm no S3 or S4 no heaves thrills rubs   Investigations:  No results found for this or any previous visit (from the past 240 hour(s)).   Basic Metabolic Panel:  Recent Labs  08/19/15 0556 08/20/15 0542  NA 136 136  K 4.8 4.8  CL 94* 91*  CO2 33* 34*  GLUCOSE 303* 242*  BUN 28* 39*  CREATININE 1.21* 1.22*  CALCIUM 9.0 9.3   Liver Function Tests:  Recent Labs  08/17/15 2145  AST 18  ALT 13*  ALKPHOS 98  BILITOT 1.0  PROT 7.3  ALBUMIN 3.2*     CBC:  Recent Labs  08/17/15 2145  WBC 11.5*  NEUTROABS 8.2*  HGB 11.8*  HCT 38.6  MCV 88.3  PLT 328    No results found.    Medications:   Impression:  Principal Problem:   CHF exacerbation (HCC) Active Problems:   COPD (chronic obstructive pulmonary disease) (HCC)   Essential hypertension, benign   Diabetes (HCC)   SVT (supraventricular tachycardia) (HCC)   Pulmonary edema   SOB (shortness of breath)   Open wound of knee, leg (except thigh), and ankle     Plan: Add Lantus 35 units at bedtime and 25 units 1 time dose now to cover hyperglycemia secondary to steroid administration  Consultants:     Procedures   Antibiotics:                   Code Status: Full  Family Communication:  Spoke with patient and husband  Disposition Plan see plan above  Time spent: 30 minutes   LOS: 2 days   Aaliyha Mumford M   08/20/2015, 9:59 AM

## 2015-08-20 NOTE — Progress Notes (Signed)
Patient's HR sustains in the 120-130's, paged MD to make them aware. No new orders received.

## 2015-08-21 LAB — GLUCOSE, CAPILLARY
GLUCOSE-CAPILLARY: 238 mg/dL — AB (ref 65–99)
GLUCOSE-CAPILLARY: 139 mg/dL — AB (ref 65–99)
Glucose-Capillary: 131 mg/dL — ABNORMAL HIGH (ref 65–99)
Glucose-Capillary: 261 mg/dL — ABNORMAL HIGH (ref 65–99)

## 2015-08-21 LAB — BASIC METABOLIC PANEL
Anion gap: 8 (ref 5–15)
BUN: 46 mg/dL — AB (ref 6–20)
CHLORIDE: 95 mmol/L — AB (ref 101–111)
CO2: 35 mmol/L — AB (ref 22–32)
CREATININE: 1.08 mg/dL — AB (ref 0.44–1.00)
Calcium: 8.9 mg/dL (ref 8.9–10.3)
GFR calc Af Amer: >60 mL/min (ref >60)
GFR calc non Af Amer: 53 mL/min — ABNORMAL LOW (ref >60)
GLUCOSE: 161 mg/dL — AB (ref 65–99)
POTASSIUM: 4.1 mmol/L (ref 3.5–5.1)
SODIUM: 138 mmol/L (ref 135–145)

## 2015-08-21 MED ORDER — LINDANE 1 % EX SHAM
MEDICATED_SHAMPOO | Freq: Once | CUTANEOUS | Status: AC
  Administered 2015-08-21: 1 via TOPICAL
  Filled 2015-08-21: qty 60

## 2015-08-21 NOTE — Progress Notes (Signed)
Patient continues with moderate degree of bronchospasm day 3 on steroids patient has normal LV function [name] [id number] DOB: Nov 17, 1950 DOA: 08/17/2015 PCP: Debra Curet, MD             Physical Exam: Blood pressure 128/62, pulse 78, temperature 97.5 F (36.4 C), temperature source Oral, resp. rate 20, height 5\' 6"  (1.676 m), weight 204 lb 5.9 oz (92.7 kg), SpO2 97 %. lungs show moderate inspiratory and expiratory wheezes prolonged expiratory phase scattered rhonchi no rales bilaterally heart regular rhythm no S3 or S4 no heaves thrills rubs   Investigations:  No results found for this or any previous visit (from the past 240 hour(s)).   Basic Metabolic Panel:  Recent Labs  08/19/15 0556 08/20/15 0542  NA 136 136  K 4.8 4.8  CL 94* 91*  CO2 33* 34*  GLUCOSE 303* 242*  BUN 28* 39*  CREATININE 1.21* 1.22*  CALCIUM 9.0 9.3   Liver Function Tests: No results for input(s): AST, ALT, ALKPHOS, BILITOT, PROT, ALBUMIN in the last 72 hours.   CBC: No results for input(s): WBC, NEUTROABS, HGB, HCT, MCV, PLT in the last 72 hours.  No results found.    Medications:   Impression:  Principal Problem:   CHF exacerbation (HCC) Active Problems:   COPD (chronic obstructive pulmonary disease) (HCC)   Essential hypertension, benign   Diabetes (HCC)   SVT (supraventricular tachycardia) (HCC)   Pulmonary edema   SOB (shortness of breath)   Open wound of knee, leg (except thigh), and ankle     Plan: Continue IV Solu-Medrol current dosage continue nebulizer therapy encourage ambulation  Consultants:    Procedures   Antibiotics:                   Code Status:  Family Communication:  Spoke with patient and husband in room  Disposition Plan see plan above  Time spent: 30 minutes   LOS: 3 days   [name] M   08/21/2015, 7:11 AM

## 2015-08-22 LAB — GLUCOSE, CAPILLARY
GLUCOSE-CAPILLARY: 304 mg/dL — AB (ref 65–99)
Glucose-Capillary: 288 mg/dL — ABNORMAL HIGH (ref 65–99)
Glucose-Capillary: 208 mg/dL — ABNORMAL HIGH (ref 65–99)
Glucose-Capillary: 242 mg/dL — ABNORMAL HIGH (ref 65–99)

## 2015-08-22 MED ORDER — IPRATROPIUM-ALBUTEROL 0.5-2.5 (3) MG/3ML IN SOLN
3.0000 mL | Freq: Three times a day (TID) | RESPIRATORY_TRACT | Status: DC
  Administered 2015-08-22 – 2015-08-23 (×3): 3 mL via RESPIRATORY_TRACT
  Filled 2015-08-22 (×3): qty 3

## 2015-08-22 NOTE — Progress Notes (Signed)
Inpatient Diabetes Program Recommendations 08/22/15 AACE/ADA: New Consensus Statement on Inpatient Glycemic Control (2015)  Target Ranges:  Prepandial:   less than 140 mg/dL      Peak postprandial:   less than 180 mg/dL (1-2 hours)      Critically ill patients:  140 - 180 mg/dL   Results for [name], [name] (MRN [id number]) as of 08/22/2015 14:07  Ref. Range 08/21/2015 11:04 08/21/2015 16:18 08/21/2015 22:32 08/22/2015 07:56 08/22/2015 11:21  Glucose-Capillary Latest Ref Range: 65-99 mg/dL 238 (H) 139 (H) 261 (H) 288 (H) 304 (H)  Currently on Solumedrol 125mg  every 6 hours.  Hospital DM meds: Lantus 25 units in the morning and 35 units in the evening; Moderate correction scale tidac and hs.  CHO modified diet. PO intake 80-100%.  Please consider increasing correction to the resistant scale while on steroids.  May also benefit from meal coverage.  Adjust insulin as needed when steroids are decreased.  [place], CDE. M.Ed. Pager [phone] Inpatient Diabetes Coordinator

## 2015-08-22 NOTE — Care Management Note (Signed)
Case Management Note  Patient Details  Name: [name] MRN: [id number] Date of Birth: 1951/02/12  Subjective/Objective:                    Action/Plan:   Expected Discharge Date:                  Expected Discharge Plan:  Home/Self Care  In-House Referral:  NA  Discharge planning Services  CM Consult  Post Acute Care Choice:  NA Choice offered to:  NA  DME Arranged:    DME Agency:     HH Arranged:    HH Agency:     Status of Service:  Completed, signed off  Medicare Important Message Given:  Yes Date Medicare IM Given:    Medicare IM give by:    Date Additional Medicare IM Given:    Additional Medicare Important Message give by:     If discussed at [place] of Stay Meetings, dates discussed: 08/22/15   Additional Comments:  Joylene Draft, RN 08/22/2015, 3:49 PM

## 2015-08-22 NOTE — Progress Notes (Signed)
Patient improving slowly somewhat less severe wheeze today as compared to previous will maintain Solu-Medrol 125 IV every 6 [name] [id number] DOB: 1951-01-30 DOA: 08/17/2015 PCP: Maricela Curet, MD             Physical Exam: Blood pressure 106/49, pulse 137, temperature 98.4 F (36.9 C), temperature source Oral, resp. rate 20, height 5\' 6"  (1.676 m), weight 203 lb 11.3 oz (92.4 kg), SpO2 95 %. nectar JVD no carotid bruits no thyromegaly lungs prolonged his return x-ray phase scattered coarse rhonchi bilaterally moderate inspiratory History wheezes bilaterally no rales audible heart regular rhythm no S3 or S4 no heaves thrills rubs  Investigations:  No results found for this or any previous visit (from the past 240 hour(s)).   Basic Metabolic Panel:  Recent Labs  08/20/15 0542 08/21/15 0620  NA 136 138  K 4.8 4.1  CL 91* 95*  CO2 34* 35*  GLUCOSE 242* 161*  BUN 39* 46*  CREATININE 1.22* 1.08*  CALCIUM 9.3 8.9   Liver Function Tests: No results for input(s): AST, ALT, ALKPHOS, BILITOT, PROT, ALBUMIN in the last 72 hours.   CBC: No results for input(s): WBC, NEUTROABS, HGB, HCT, MCV, PLT in the last 72 hours.  No results found.    Medications:   Impression:  Principal Problem:   CHF exacerbation ([place]) Active Problems:   COPD (chronic obstructive pulmonary disease) (HCC)   Essential hypertension, benign   Diabetes (HCC)   SVT (supraventricular tachycardia) (HCC)   Pulmonary edema   SOB (shortness of breath)   Open wound of knee, leg (except thigh), and ankle     Plan: Maintain Solu-Medrol 125 IV every 6 hours maintain nebulizers as ordered. Patient on Chantix for 3 days present  Consultants:    Procedures   Antibiotics:                   Code Status: Full  Family Communication:    Disposition Plan see plan above  Time spent: 30 minutes   LOS: 4 days   Aldean Pipe M   08/22/2015, 7:17 AM

## 2015-08-23 LAB — GLUCOSE, CAPILLARY
GLUCOSE-CAPILLARY: 278 mg/dL — AB (ref 65–99)
Glucose-Capillary: 336 mg/dL — ABNORMAL HIGH (ref 65–99)

## 2015-08-23 MED ORDER — METHYLPREDNISOLONE SODIUM SUCC 125 MG IJ SOLR: 80.0000 mg | Freq: Four times a day (QID) | INTRAMUSCULAR | Status: DC

## 2015-08-23 MED ORDER — VARENICLINE TARTRATE 0.5 MG PO TABS: 1.0000 mg | ORAL_TABLET | Freq: Two times a day (BID) | ORAL | Status: DC

## 2015-08-23 MED ORDER — IPRATROPIUM-ALBUTEROL 0.5-2.5 (3) MG/3ML IN SOLN: 3.0000 mL | Freq: Four times a day (QID) | RESPIRATORY_TRACT | Status: DC | PRN

## 2015-08-23 MED ORDER — PREDNISONE 20 MG PO TABS: 20.0000 mg | ORAL_TABLET | Freq: Every day | ORAL | Status: DC

## 2015-08-23 MED ORDER — PREDNISONE 20 MG PO TABS: 50.0000 mg | ORAL_TABLET | Freq: Every day | ORAL | Status: DC

## 2015-08-23 NOTE — Discharge Summary (Signed)
Physician Discharge Summary  KERSTYN FU [id number] DOB: 02-18-1951 DOA: 08/17/2015  PCP: Maricela Curet, MD  Admit date: 08/17/2015 Discharge date: 08/23/2015   Recommendations for Outpatient Follow-up:  Patient is cautioned to take all of her nebulizer therapy to stay away from cigarettes to take Chantix as well as her prednisone follow-up my office on Monday which is 4 days from day of discharge to assess bronchospasm glycemic control and hemodynamics and hypertension Discharge Diagnoses:  Principal Problem:   CHF exacerbation ([place]) Active Problems:   COPD (chronic obstructive pulmonary disease) ([place])   Essential hypertension, benign   Diabetes ([place])   SVT (supraventricular tachycardia) (H[facility])   Pulmonary edema   SOB (shortness of breath)   Open wound of knee, leg (except thigh), and ankle   Discharge Condition: Good  Filed Weights   08/21/15 0546 08/22/15 0406 08/23/15 0626  Weight: 204 lb 5.9 oz (92.7 kg) 203 lb 11.3 oz (92.4 kg) 199 lb 15.3 oz (90.7 kg)    History of present illness:  Patient is a 2 pack-a-day smoker who came in with end-stage COPD and respiratory insufficiency placed on high-dose Solu-Medrol IV every 6 hours as well as DuoNeb nebulizer she had a very slow improvement rate 5 days in hospital placed on Chantix while in hospital 2-D echocardiogram revealed normal systolic function with no evidence of congestive heart failure) dependent edema of lower extremities after femoropopliteal bypass bilaterally patient continued to improve she had some hyperglycemia intervertebral to high-dose steroids and this was managed by increasing her Lantus insulin sliding scale coverage was subtotally discharged on prednisone 50 mg by mouth daily is advised to take 35 units of Lantus nightly  Hospital Course:  See history of present illness Procedures:    Consultations:    Discharge Instructions  Discharge Instructions    Discharge instructions    Complete  by:  As directed      Discharge patient    Complete by:  As directed             Medication List    TAKE these medications        ALPRAZolam 0.25 MG tablet  Commonly known as:  XANAX  Take 0.25 mg by mouth 4 (four) times daily as needed for anxiety.     aspirin 325 MG tablet  Take 1 tablet (325 mg total) by mouth daily.     atorvastatin 20 MG tablet  Commonly known as:  LIPITOR  Take 1 tablet (20 mg total) by mouth daily at 6 PM.     citalopram 20 MG tablet  Commonly known as:  CELEXA  Take 20 mg by mouth daily.     dextromethorphan-guaiFENesin 30-600 MG 12hr tablet  Commonly known as:  MUCINEX DM  Take 1 tablet by mouth 2 (two) times daily as needed for cough.     gabapentin 300 MG capsule  Commonly known as:  NEURONTIN  Take 300 mg by mouth 3 (three) times daily.     ipratropium-albuterol 0.5-2.5 (3) MG/3ML Soln  Commonly known as:  DUONEB  Take 3 mLs by nebulization every 6 (six) hours as needed.     LANTUS SOLOSTAR 100 UNIT/ML Solostar Pen  Generic drug:  Insulin Glargine  Inject 34 Units into the skin at bedtime.     lisinopril 20 MG tablet  Commonly known as:  PRINIVIL,ZESTRIL  Take 20 mg by mouth daily.     metFORMIN 500 MG tablet  Commonly known as:  GLUCOPHAGE  Take 500  mg by mouth 2 (two) times daily with a meal.     metoprolol tartrate 25 MG tablet  Commonly known as:  LOPRESSOR  Take 1 tablet (25 mg total) by mouth 2 (two) times daily.     naproxen 500 MG tablet  Commonly known as:  NAPROSYN  Take 500 mg by mouth 2 (two) times daily.     oxyCODONE-acetaminophen 5-325 MG tablet  Commonly known as:  PERCOCET  Take 1 tablet by mouth every 8 (eight) hours as needed for severe pain.     pantoprazole 40 MG tablet  Commonly known as:  PROTONIX  Take 1 tablet (40 mg total) by mouth daily at 12 noon.     varenicline 0.5 MG tablet  Commonly known as:  CHANTIX  Take 2 tablets (1 mg total) by mouth 2 (two) times daily.       Allergies    Allergen Reactions  . Penicillins Other (See Comments)    Unknown reaction  . Ciprofloxacin Hives      The results of significant diagnostics from this hospitalization (including imaging, microbiology, ancillary and laboratory) are listed below for reference.    Significant Diagnostic Studies: Dg Chest 2 View  08/17/2015  CLINICAL DATA:  Bilateral leg swelling, shortness of breath and nonproductive cough for 3 days. History of COPD, hypertension, diabetes. EXAM: CHEST  2 VIEW COMPARISON:  Chest radiograph April 13, 2015 FINDINGS: The cardiac silhouette is mildly enlarged, increased from prior imaging. Calcified aortic knob. Pulmonary vascular congestion and mild interstitial prominence. Small to moderate layering LEFT pleural effusion. No focal consolidation. No pneumothorax. 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 Mild degenerative change of the thoracic spine. IMPRESSION: Increasing cardiomegaly, with findings of interstitial pulmonary edema. Small to moderate LEFT pleural effusion. Electronically Signed   By: Elon Alas M.D.   On: 08/17/2015 23:10    Microbiology: No results found for this or any previous visit (from the past 240 hour(s)).   Labs: Basic Metabolic Panel:  Recent Labs Lab 08/17/15 2145 08/19/15 0556 08/20/15 0542 08/21/15 0620  NA 137 136 136 138  K 4.0 4.8 4.8 4.1  CL 96* 94* 91* 95*  CO2 33* 33* 34* 35*  GLUCOSE 130* 303* 242* 161*  BUN 22* 28* 39* 46*  CREATININE 0.81 1.21* 1.22* 1.08*  CALCIUM 9.0 9.0 9.3 8.9   Liver Function Tests:  Recent Labs Lab 08/17/15 2145  AST 18  ALT 13*  ALKPHOS 98  BILITOT 1.0  PROT 7.3  ALBUMIN 3.2*   No results for input(s): LIPASE, AMYLASE in the last 168 hours. No results for input(s): AMMONIA in the last 168 hours. CBC:  Recent Labs Lab 08/17/15 2145  WBC 11.5*  NEUTROABS 8.2*  HGB 11.8*  HCT 38.6  MCV 88.3  PLT 328   Cardiac Enzymes: No results for input(s): CKTOTAL,  CKMB, CKMBINDEX, TROPONINI in the last 168 hours. BNP: BNP (last 3 results)  Recent Labs  08/17/15 2145  BNP 961.0*    ProBNP (last 3 results) No results for input(s): PROBNP in the last 8760 hours.  CBG:  Recent Labs Lab 08/22/15 1121 08/22/15 1551 08/22/15 2028 08/23/15 0814 08/23/15 1145  GLUCAP 304* 208* 242* 278* 336*       Signed:  Weda Baumgarner M  [facility] Pager: [phone] 08/23/2015, 12:38 PM

## 2015-08-23 NOTE — Care Management Important Message (Signed)
Important Message  Patient Details  Name: [name] MRN: [id number] Date of Birth: 01/01/1951   Medicare Important Message Given:  Yes    Joylene Draft, RN 08/23/2015, 1:07 PM

## 2015-08-23 NOTE — Care Management Note (Signed)
Case Management Note  Patient Details  Name: [name] MRN: [id number] Date of Birth: 1951/01/29  Subjective/Objective:                    Action/Plan:   Expected Discharge Date:                  Expected Discharge Plan:  Home/Self Care  In-House Referral:  NA  Discharge planning Services  CM Consult  Post Acute Care Choice:  NA Choice offered to:  NA  DME Arranged:    DME Agency:     HH Arranged:    HH Agency:     Status of Service:  Completed, signed off  Medicare Important Message Given:  Yes Date Medicare IM Given:    Medicare IM give by:    Date Additional Medicare IM Given:    Additional Medicare Important Message give by:     If discussed at [place] of Stay Meetings, dates discussed:    Additional Comments: Pt discharged home today. No CM needs noted. Pt did not qualify for home O2 at this time. Christinia Gully Prue, RN 08/23/2015, 1:07 PM

## 2015-08-23 NOTE — Progress Notes (Signed)
Inpatient Diabetes Program Recommendations  AACE/ADA: New Consensus Statement on Inpatient Glycemic Control (2015)  Target Ranges:  Prepandial:   less than 140 mg/dL      Peak postprandial:   less than 180 mg/dL (1-2 hours)      Critically ill patients:  140 - 180 mg/dL  Results for [name], [name] (MRN [id number]) as of 08/23/2015 09:20  Ref. Range 08/22/2015 07:56 08/22/2015 11:21 08/22/2015 15:51 08/22/2015 20:28 08/23/2015 08:14  Glucose-Capillary Latest Ref Range: 65-99 mg/dL 288 (H) 304 (H) 208 (H) 242 (H) 278 (H)   Review of Glycemic Control  Current orders for Inpatient glycemic control: Lantus 25 units QAM, Lantus 35 units QHS, Novolog 0-15 units TID with meals, Novolog 0-5 units HS  Inpatient Diabetes Program Recommendations: Insulin - Meal Coverage: Post prandial glucose consistently elevated and patient is ordered Solumedrol 125 mg Q6H which is contributing to hyperglycemia. Please consider ordering Novolog 5 units TID with meals for meal coverage (in addition to Novolog correction scale). Insulin-Basal: If steroids are continued as ordered, please consider increasing Lantus to 28 units QAM and Lantus 38 units QHS.  Thanks, Barnie Alderman, RN, MSN, CDE Diabetes Coordinator Inpatient Diabetes Program [phone] (Team Pager from [place] to [place]) [phone] ([place] office) [phone] [facility] office) [phone] [facility] office)

## 2015-08-23 NOTE — Progress Notes (Signed)
Patient states understanding of discharge instructions, prescriptions given. Patient's 02 sat without oxygen and at rest is 95%, when ambulating without oxygen patient's 02 sat dropped to 87% but came back up to 95% with a couple of deep breaths.

## 2015-09-04 ENCOUNTER — Ambulatory Visit: Payer: Medicare Other | Admitting: Cardiovascular Disease

## 2015-09-05 ENCOUNTER — Ambulatory Visit: Payer: Medicare Other | Admitting: Cardiovascular Disease

## 2015-10-06 ENCOUNTER — Ambulatory Visit (INDEPENDENT_AMBULATORY_CARE_PROVIDER_SITE_OTHER): Payer: Medicare Other | Admitting: Cardiovascular Disease

## 2015-10-06 ENCOUNTER — Encounter: Payer: Self-pay | Admitting: Cardiovascular Disease

## 2015-10-06 ENCOUNTER — Encounter: Payer: Self-pay | Admitting: *Deleted

## 2015-10-06 VITALS — BP 118/60 | HR 74 | Ht 66.0 in | Wt 183.0 lb

## 2015-10-06 DIAGNOSIS — R000 Tachycardia, unspecified: Secondary | ICD-10-CM

## 2015-10-06 NOTE — Progress Notes (Signed)
Cardiology Office Note   Date:  10/06/2015   ID:  Debra, Glover July 13, 1951, MRN [id number]  PCP:  Maricela Curet, MD  Cardiologist:   Thayer Headings, MD   Chief Complaint  Patient presents with  . Follow-up    sinus tachycardia   Problem List 1. Sinus tach  2. COPD 3. Diabetes mellitus 4. Essential HTN 5. Peripheral vascular disease.    History of Present Illness: [name] is a 65 y.o. female who presents for follow up of her sinus tachycardia  [place] was recently admitted with COPD exacerbation . Echo : normal LV function  Has stopped smoking .  Also is getting over shingles.     Past Medical History  Diagnosis Date  . Hypertension   . Diabetes mellitus without complication ([place])   . COPD (chronic obstructive pulmonary disease) ([place])   . Stroke ([place])   . Chronic back pain     Past Surgical History  Procedure Laterality Date  . Back fusion    . Cholecystectomy    . Foot surgery Bilateral   . Cardiac catheterization    . Peripheral vascular catheterization N/A 04/10/2015    Procedure: Abdominal Aortogram w/Lower Extremity;  Surgeon: Conrad [place], MD;  Location: [place] [facility];  Service: Cardiovascular;  Laterality: N/A;  . Thrombectomy femoral artery Right 04/10/2015    Procedure: THROMBECTOMY RIGHT TIBIAL  ARTERY WITH VEIN PATCH ANGIOPLASTY;  Surgeon: Elam Dutch, MD;  Location: [place];  Service: Vascular;  Laterality: Right;  . Fasciotomy Right 04/10/2015    Procedure: FOUR COMPARTMENT FASCIOTOMY;  Surgeon: Elam Dutch, MD;  [facility];  Service: Vascular;  Laterality: Right;  . Femoral-popliteal bypass graft Right 04/10/2015    Procedure: RIGHT  FEMORAL-BELOW THE KNEE POPLITEAL ARTERY BYPASS GRAFT USING 6 MM X 80 CM PROPATEN GRAFT;  Surgeon: Elam Dutch, MD;  Location: [place];  Service: Vascular;  Laterality: Right;  . Intraoperative arteriogram Right 04/10/2015    Procedure: INTRA OPERATIVE ARTERIOGRAM;  Surgeon: Elam Dutch, MD;  Location: [facility] OR;  Service: Vascular;  Laterality: Right;  . Carotid endarterectomy      Right CEA Dr Lawson 2010     Current Outpatient Prescriptions  Medication Sig Dispense Refill  . ALPRAZolam (XANAX) 0.25 MG tablet Take 0.25 mg by mouth 4 (four) times daily as needed for anxiety.     Marland Kitchen aspirin 325 MG tablet Take 1 tablet (325 mg total) by mouth daily. 30 tablet 0  . atorvastatin (LIPITOR) 20 MG tablet Take 1 tablet (20 mg total) by mouth daily at 6 PM. 30 tablet 0  . citalopram (CELEXA) 20 MG tablet Take 20 mg by mouth daily.    Marland Kitchen dextromethorphan-guaiFENesin (MUCINEX DM) 30-600 MG 12hr tablet Take 1 tablet by mouth 2 (two) times daily as needed for cough.     . gabapentin (NEURONTIN) 300 MG capsule Take 300 mg by mouth 3 (three) times daily.    . Insulin Glargine (LANTUS SOLOSTAR) 100 UNIT/ML Solostar Pen Inject 34 Units into the skin at bedtime.    Marland Kitchen ipratropium-albuterol (DUONEB) 0.5-2.5 (3) MG/3ML SOLN Take 3 mLs by nebulization every 6 (six) hours as needed (wheezing).    Marland Kitchen lisinopril (PRINIVIL,ZESTRIL) 20 MG tablet Take 20 mg by mouth daily.    . metFORMIN (GLUCOPHAGE) 500 MG tablet Take 500 mg by mouth 2 (two) times daily with a meal.    . metoprolol tartrate (LOPRESSOR) 25 MG tablet Take 12.5  mg by mouth 2 (two) times daily.    . naproxen (NAPROSYN) 500 MG tablet Take 500 mg by mouth 2 (two) times daily.    Marland Kitchen oxyCODONE-acetaminophen (PERCOCET) 5-325 MG per tablet Take 1 tablet by mouth every 8 (eight) hours as needed for severe pain. 30 tablet 0  . pantoprazole (PROTONIX) 40 MG tablet Take 40 mg by mouth daily.    . varenicline (CHANTIX) 0.5 MG tablet Take 2 tablets (1 mg total) by mouth 2 (two) times daily. 60 tablet 3   No current facility-administered medications for this visit.    Allergies:   Penicillins and Ciprofloxacin    Social History:  The patient  reports that she quit smoking about 3 months ago. Her smoking use included Cigarettes. She quit after 40  years of use. She has never used smokeless tobacco. She reports that she does not drink alcohol or use illicit drugs.   Family History:  The patient's family history includes Cancer in her father; Heart attack in her sister; Hypertension in her brother and sister; Stroke in her sister.    ROS:  Please see the history of present illness.    Review of Systems: Constitutional:  denies fever, chills, diaphoresis, appetite change and fatigue.  HEENT: denies photophobia, eye pain, redness, hearing loss, ear pain, congestion, sore throat, rhinorrhea, sneezing, neck pain, neck stiffness and tinnitus.  Respiratory: denies SOB, DOE, cough, chest tightness, and wheezing.  Cardiovascular: denies chest pain, palpitations and leg swelling.  Gastrointestinal: denies nausea, vomiting, abdominal pain, diarrhea, constipation, blood in stool.  Genitourinary: denies dysuria, urgency, frequency, hematuria, flank pain and difficulty urinating.  Musculoskeletal: denies  myalgias, back pain, joint swelling, arthralgias and gait problem.   Skin: denies pallor, rash and wound.  Neurological: denies dizziness, seizures, syncope, weakness, light-headedness, numbness and headaches.   Hematological: denies adenopathy, easy bruising, personal or family bleeding history.  Psychiatric/ Behavioral: denies suicidal ideation, mood changes, confusion, nervousness, sleep disturbance and agitation.       All other systems are reviewed and negative.    PHYSICAL EXAM: VS:  BP 118/60 mmHg  Pulse 74  Ht 5\' 6"  (1.676 m)  Wt 183 lb (83.008 kg)  BMI 29.55 kg/m2 , BMI Body mass index is 29.55 kg/(m^2). GEN: Well nourished, well developed, in no acute distress HEENT: normal Neck: no JVD, carotid bruits, or masses Cardiac:   Tachycardia , RRR; no murmurs, rubs, or gallops,no edema  Respiratory:  clear to auscultation bilaterally, normal work of breathing GI: soft, nontender, nondistended, + BS MS: no deformity or  atrophy Skin: warm and dry, no rash Neuro:  Strength and sensation are intact Psych: normal   EKG:  EKG is ordered today.  NSR at 73.   LAD     Recent Labs: 04/12/2015: TSH 0.569 08/17/2015: ALT 13*; B Natriuretic Peptide 961.0*; Hemoglobin 11.8*; Platelets 328 08/21/2015: BUN 46*; Creatinine, Ser 1.08*; Potassium 4.1; Sodium 138    Lipid Panel    Component Value Date/Time   CHOL 80 04/12/2015 0223   TRIG 53 04/12/2015 0223   HDL 32* 04/12/2015 0223   CHOLHDL 2.5 04/12/2015 0223   VLDL 11 04/12/2015 0223   LDLCALC 37 04/12/2015 0223      Wt Readings from Last 3 Encounters:  10/06/15 183 lb (83.008 kg)  08/23/15 199 lb 15.3 oz (90.7 kg)  07/13/15 182 lb (82.555 kg)      Other studies Reviewed: Additional studies/ records that were reviewed today include: . Review of the above records demonstrates:  ASSESSMENT AND PLAN:  1.  Sinus tachycardia:  Her sinus tachycardia has resolved. Continue current dose of metoprolol.  2. Peripheral vascular disease:  The patient is stop smoking. She has been seen by Dr. Oneida Alar.  She's currently on aspirin 325 mg a day. I suspect that she could reduce her dose 81 mg a day but I will defer to Dr. Oneida Alar since she is on the ASA for PVD.    3. Essential hypertension: Her blood pressure is well-controlled. Continue current medications.  I'll see her on an as-needed basis.   Current medicines are reviewed at length with the patient today.  The patient does not have concerns regarding medicines.  The following changes have been made:  no change  Labs/ tests ordered today include:  No orders of the defined types were placed in this encounter.     Disposition:   FU with me in as needed.      Nivia Gervase, Wonda Cheng, MD  10/06/2015 8:59 AM    [facility] [place], [place], [place]  [address] Phone: [phone]; Fax: [phone]   [facility]  [address] [place], [place]   [address] [phone]   Fax [phone]

## 2015-10-06 NOTE — Patient Instructions (Signed)
Medication Instructions:  Same-no changes  Labwork: None  Testing/Procedures: None  Follow-Up: As needed     If you need a refill on your cardiac medications before your next appointment, please call your pharmacy.   

## 2015-10-10 ENCOUNTER — Encounter: Payer: Self-pay | Admitting: Vascular Surgery

## 2015-10-19 ENCOUNTER — Other Ambulatory Visit: Payer: Self-pay

## 2015-10-19 ENCOUNTER — Encounter: Payer: Self-pay | Admitting: Vascular Surgery

## 2015-10-19 ENCOUNTER — Ambulatory Visit (INDEPENDENT_AMBULATORY_CARE_PROVIDER_SITE_OTHER): Payer: Medicare Other | Admitting: Vascular Surgery

## 2015-10-19 ENCOUNTER — Ambulatory Visit (INDEPENDENT_AMBULATORY_CARE_PROVIDER_SITE_OTHER)
Admission: RE | Admit: 2015-10-19 | Discharge: 2015-10-19 | Disposition: A | Discharge status: Home / Self Care | Payer: Medicare Other | Source: Ambulatory Visit | Attending: Vascular Surgery | Admitting: Vascular Surgery

## 2015-10-19 ENCOUNTER — Ambulatory Visit (HOSPITAL_COMMUNITY)
Admission: RE | Admit: 2015-10-19 | Discharge: 2015-10-19 | Disposition: A | Discharge status: Home / Self Care | Payer: Medicare Other | Source: Ambulatory Visit | Attending: Vascular Surgery | Admitting: Vascular Surgery

## 2015-10-19 VITALS — BP 141/84 | HR 118 | Temp 98.0°F | Resp 18 | Ht 66.0 in | Wt 179.0 lb

## 2015-10-19 DIAGNOSIS — M79604 Pain in right leg: Secondary | ICD-10-CM | POA: Insufficient documentation

## 2015-10-19 DIAGNOSIS — Z87891 Personal history of nicotine dependence: Secondary | ICD-10-CM | POA: Diagnosis not present

## 2015-10-19 DIAGNOSIS — Z95828 Presence of other vascular implants and grafts: Secondary | ICD-10-CM | POA: Diagnosis not present

## 2015-10-19 DIAGNOSIS — I739 Peripheral vascular disease, unspecified: Secondary | ICD-10-CM

## 2015-10-19 DIAGNOSIS — E119 Type 2 diabetes mellitus without complications: Secondary | ICD-10-CM | POA: Insufficient documentation

## 2015-10-19 DIAGNOSIS — I10 Essential (primary) hypertension: Secondary | ICD-10-CM | POA: Diagnosis not present

## 2015-10-19 NOTE — Progress Notes (Signed)
[facility] [place] HISTORY AND PHYSICAL   History of Present Illness:  Patient is a 65 year old female who presents for follow-up after right femoral to below-knee popliteal bypass and thrombectomy of her tibial vessels for acute on chronic ischemia. This was 7 months ago. She also had a fasciotomy and returns today for follow-up evaluation of her fasciotomy wound .  She is on Aspirin for antiplatelet therapy. She states she was in the hospital for a COPD exacerbation recently. During the time of that hospital admission a pre-existing right leg fasciotomy wound which had completely healed has now reopened. She does not complain of pain in her foot. However, today on her noninvasive vascular exam she was noted to have minimal flow in the right foot and occlusion of her right femoropopliteal bypass. Her atherosclerotic risk factors remain diabetes, hypertension, smoking.  She has not smoked for several months. These are all currently stable and followed by the primary care physician.  The patient was minimally active prior to her operation. She remains minimally active now and has to be encouraged to walk more. She is currently ambulating.  She had one vessel runoff via the posterior tibial artery at the conclusion of the case.      Past Medical History     Diagnosis    Date     .    Hypertension         .    Diabetes mellitus without complication         .    COPD (chronic obstructive pulmonary disease)         .    Stroke         .    Chronic back pain             Past Surgical History     Procedure    Laterality    Date     .    Back fusion             .    Cholecystectomy             .    Foot surgery    Bilateral         .    Cardiac catheterization             .    Peripheral vascular catheterization    N/A    04/10/2015             Procedure: Abdominal Aortogram w/Lower Extremity;  Surgeon: Conrad Lower [place], MD;  Location: [place] [facility];  Service: Cardiovascular;   Laterality: N/A;     .    Thrombectomy femoral artery    Right    04/10/2015             Procedure: THROMBECTOMY RIGHT TIBIAL  ARTERY WITH VEIN PATCH ANGIOPLASTY;  Surgeon: Elam Dutch, MD;  Location: [place];  Service: Vascular;  Laterality: Right;     .    Fasciotomy    Right    04/10/2015             Procedure: FOUR COMPARTMENT FASCIOTOMY;  Surgeon: Elam Dutch, MD;  Location: [facility] OR;  Service: Vascular;  Laterality: Right;     .    Femoral-popliteal bypass graft    Right    04/10/2015             Procedure: RIGHT  FEMORAL-BELOW THE KNEE POPLITEAL ARTERY BYPASS GRAFT  USING 6 MM X 80 CM PROPATEN GRAFT;  Surgeon: Elam Dutch, MD;  Location: [facility] OR;  Service: Vascular;  Laterality: Right;     .    Intraoperative arteriogram    Right    04/10/2015             Procedure: INTRA OPERATIVE ARTERIOGRAM;  Surgeon: Elam Dutch, MD;  Location: [facility] OR;  Service: Vascular;  Laterality: Right;       Physical Examination Filed Vitals:   10/19/15 1049 10/19/15 1058  BP: 145/88 141/84  Pulse: 119 118  Temp: 98 F (36.7 C)   TempSrc: Oral   Resp: 18   Height: 5\' 6"  (1.676 m)   Weight: 179 lb (81.194 kg)   SpO2: 99%       General:  Alert and oriented, no acute distress Neurologic: Right foot drop, no skin ulcers on either foot, right leg is cool from the knee to the foot. Right foot is dusky in appearance. There is a 7 x 3 cm open fasciotomy wound on the lateral aspect of the right leg with minimal granulation tissue and some necrotic exudate. Extremities:  Right leg edematous extending from the mid thigh down to the foot right groin incision and the incision well-healed  Skin: no ulcer or rash the exception listed above  Data: Patient had bilateral ABIs performed today which were minimal flow noted on the right 0.96 on the left with biphasic waveforms. Graft duplex showed no increased velocities throughout the bypass graft previously but today this is occluded  ASSESSMENT: Wound breakdown  of right leg fasciotomy with occlusion of right femoropopliteal bypass graft. Patient is currently asymptomatic. Difficult to know how long the graft has been occluded. At this point no she is certainly at risk of limb loss that she has minimal flow to the right foot.Marland Kitchen   PLAN:  aortogram bilateral lower extremity runoff tomorrow to see if she is a potential candidate for redo bypass. Patient states she may not have transportation tomorrow. I emphasized to her that she is at high risk of limb loss. She will try to get the procedure scheduled as soon as possible. Risks benefits possible palpitations and procedure details were discussed with the patient today including but not limited to bleeding infection and high risk of limb loss if she does not have revascularization  Ruta Hinds, MD [facility] [place]: [phone] Pager: [phone]

## 2015-10-19 NOTE — Progress Notes (Signed)
Filed Vitals:   10/19/15 1049 10/19/15 1058  BP: 145/88 141/84  Pulse: 119 118  Temp: 98 F (36.7 C)   TempSrc: Oral   Resp: 18   Height: 5\' 6"  (1.676 m)   Weight: 179 lb (81.194 kg)   SpO2: 99%

## 2015-10-20 ENCOUNTER — Other Ambulatory Visit: Payer: Self-pay

## 2015-10-20 ENCOUNTER — Encounter (HOSPITAL_COMMUNITY)
Admission: RE | Disposition: A | Discharge status: Home / Self Care | Payer: Medicare Other | Source: Ambulatory Visit | Attending: Vascular Surgery

## 2015-10-20 ENCOUNTER — Ambulatory Visit (HOSPITAL_COMMUNITY)
Admission: RE | Admit: 2015-10-20 | Discharge: 2015-10-20 | Disposition: A | Discharge status: Home / Self Care | Payer: Medicare Other | Source: Ambulatory Visit | Attending: Vascular Surgery | Admitting: Vascular Surgery

## 2015-10-20 DIAGNOSIS — Z981 Arthrodesis status: Secondary | ICD-10-CM | POA: Insufficient documentation

## 2015-10-20 DIAGNOSIS — J449 Chronic obstructive pulmonary disease, unspecified: Secondary | ICD-10-CM | POA: Insufficient documentation

## 2015-10-20 DIAGNOSIS — I70301 Unspecified atherosclerosis of unspecified type of bypass graft(s) of the extremities, right leg: Secondary | ICD-10-CM | POA: Insufficient documentation

## 2015-10-20 DIAGNOSIS — M549 Dorsalgia, unspecified: Secondary | ICD-10-CM | POA: Diagnosis not present

## 2015-10-20 DIAGNOSIS — G8929 Other chronic pain: Secondary | ICD-10-CM | POA: Diagnosis not present

## 2015-10-20 DIAGNOSIS — I10 Essential (primary) hypertension: Secondary | ICD-10-CM | POA: Diagnosis not present

## 2015-10-20 DIAGNOSIS — Z87891 Personal history of nicotine dependence: Secondary | ICD-10-CM | POA: Insufficient documentation

## 2015-10-20 DIAGNOSIS — I70203 Unspecified atherosclerosis of native arteries of extremities, bilateral legs: Secondary | ICD-10-CM | POA: Diagnosis not present

## 2015-10-20 DIAGNOSIS — Z8673 Personal history of transient ischemic attack (TIA), and cerebral infarction without residual deficits: Secondary | ICD-10-CM | POA: Insufficient documentation

## 2015-10-20 DIAGNOSIS — E119 Type 2 diabetes mellitus without complications: Secondary | ICD-10-CM | POA: Diagnosis not present

## 2015-10-20 DIAGNOSIS — Z7982 Long term (current) use of aspirin: Secondary | ICD-10-CM | POA: Diagnosis not present

## 2015-10-20 DIAGNOSIS — I998 Other disorder of circulatory system: Secondary | ICD-10-CM | POA: Diagnosis not present

## 2015-10-20 HISTORY — PX: PERIPHERAL VASCULAR CATHETERIZATION: SHX172C

## 2015-10-20 LAB — POCT I-STAT, CHEM 8
BUN: 22 mg/dL — ABNORMAL HIGH (ref 6–20)
CREATININE: 0.8 mg/dL (ref 0.44–1.00)
Calcium, Ion: 1.2 mmol/L (ref 1.13–1.30)
Chloride: 98 mmol/L — ABNORMAL LOW (ref 101–111)
Glucose, Bld: 142 mg/dL — ABNORMAL HIGH (ref 65–99)
HEMATOCRIT: 35 % — AB (ref 36.0–46.0)
HEMOGLOBIN: 11.9 g/dL — AB (ref 12.0–15.0)
POTASSIUM: 4.7 mmol/L (ref 3.5–5.1)
SODIUM: 138 mmol/L (ref 135–145)
TCO2: 32 mmol/L (ref 0–100)

## 2015-10-20 LAB — GLUCOSE, CAPILLARY: GLUCOSE-CAPILLARY: 119 mg/dL — AB (ref 65–99)

## 2015-10-20 SURGERY — ABDOMINAL AORTOGRAM

## 2015-10-20 MED ORDER — MORPHINE SULFATE (PF) 10 MG/ML IV SOLN
2.0000 mg | INTRAVENOUS | Status: DC | PRN
  Administered 2015-10-20: 4 mg via INTRAVENOUS
  Administered 2015-10-20: 2 mg via INTRAVENOUS

## 2015-10-20 MED ORDER — OXYCODONE HCL 5 MG PO TABS
ORAL_TABLET | ORAL | Status: AC
  Filled 2015-10-20: qty 2

## 2015-10-20 MED ORDER — METOPROLOL TARTRATE 1 MG/ML IV SOLN: 2.0000 mg | INTRAVENOUS | Status: DC | PRN

## 2015-10-20 MED ORDER — ACETAMINOPHEN 325 MG RE SUPP: 325.0000 mg | RECTAL | Status: DC | PRN

## 2015-10-20 MED ORDER — NITROGLYCERIN 1 MG/10 ML FOR IR/CATH LAB
INTRA_ARTERIAL | Status: DC | PRN
  Administered 2015-10-20: 13:00:00

## 2015-10-20 MED ORDER — MORPHINE SULFATE (PF) 2 MG/ML IV SOLN
INTRAVENOUS | Status: AC
  Filled 2015-10-20: qty 1

## 2015-10-20 MED ORDER — LABETALOL HCL 5 MG/ML IV SOLN: 10.0000 mg | INTRAVENOUS | Status: DC | PRN

## 2015-10-20 MED ORDER — ACETAMINOPHEN 325 MG PO TABS: 325.0000 mg | ORAL_TABLET | ORAL | Status: DC | PRN

## 2015-10-20 MED ORDER — OXYCODONE HCL 5 MG PO TABS
5.0000 mg | ORAL_TABLET | ORAL | Status: DC | PRN
  Administered 2015-10-20: 10 mg via ORAL

## 2015-10-20 MED ORDER — ONDANSETRON HCL 4 MG/2ML IJ SOLN: 4.0000 mg | Freq: Four times a day (QID) | INTRAMUSCULAR | Status: DC | PRN

## 2015-10-20 MED ORDER — MORPHINE SULFATE (PF) 4 MG/ML IV SOLN
INTRAVENOUS | Status: AC
  Filled 2015-10-20: qty 1

## 2015-10-20 MED ORDER — SODIUM CHLORIDE 0.45 % IV SOLN
INTRAVENOUS | Status: DC
  Administered 2015-10-20: 13:00:00 via INTRAVENOUS

## 2015-10-20 MED ORDER — HYDRALAZINE HCL 20 MG/ML IJ SOLN: 5.0000 mg | INTRAMUSCULAR | Status: DC | PRN

## 2015-10-20 MED ORDER — SODIUM CHLORIDE 0.9 % IV SOLN
INTRAVENOUS | Status: DC
  Administered 2015-10-20: 11:00:00 via INTRAVENOUS

## 2015-10-20 MED ORDER — IODIXANOL 320 MG/ML IV SOLN
INTRAVENOUS | Status: DC | PRN
  Administered 2015-10-20: 140 mL via INTRA_ARTERIAL

## 2015-10-20 MED ORDER — SODIUM CHLORIDE 0.9 % IV SOLN: 500.0000 mL | Freq: Once | INTRAVENOUS | Status: DC | PRN

## 2015-10-20 SURGICAL SUPPLY — 9 items
CATH ANGIO 5F PIGTAIL 65CM (CATHETERS) ×1 IMPLANT
COVER PRB 48X5XTLSCP FOLD TPE (BAG) IMPLANT
COVER PROBE 5X48 (BAG) ×2
KIT PV (KITS) ×2 IMPLANT
SHEATH PINNACLE 5F 10CM (SHEATH) ×1 IMPLANT
SYR MEDRAD MARK V 150ML (SYRINGE) ×2 IMPLANT
TRANSDUCER W/STOPCOCK (MISCELLANEOUS) ×2 IMPLANT
TRAY PV CATH (CUSTOM PROCEDURE TRAY) ×2 IMPLANT
WIRE HITORQ VERSACORE ST 145CM (WIRE) ×1 IMPLANT

## 2015-10-20 NOTE — Discharge Instructions (Signed)
Angiogram, Care After °Refer to this sheet in the next few weeks. These instructions provide you with information about caring for yourself after your procedure. Your health care provider may also give you more specific instructions. Your treatment has been planned according to current medical practices, but problems sometimes occur. Call your health care provider if you have any problems or questions after your procedure. °WHAT TO EXPECT AFTER THE PROCEDURE °After your procedure, it is typical to have the following: °· Bruising at the catheter insertion site that usually fades within 1-2 weeks. °· Blood collecting in the tissue (hematoma) that may be painful to the touch. It should usually decrease in size and tenderness within 1-2 weeks. °HOME CARE INSTRUCTIONS °· Take medicines only as directed by your health care provider. °· You may shower 24-48 hours after the procedure or as directed by your health care provider. Remove the bandage (dressing) and gently wash the site with plain soap and water. Pat the area dry with a clean towel. Do not rub the site, because this may cause bleeding. °· Do not take baths, swim, or use a hot tub until your health care provider approves. °· Check your insertion site every day for redness, swelling, or drainage. °· Do not apply powder or lotion to the site. °· Do not lift over 10 lb (4.5 kg) for 5 days after your procedure or as directed by your health care provider. °· Ask your health care provider when it is okay to: °¨ Return to work or school. °¨ Resume usual physical activities or sports. °¨ Resume sexual activity. °· Do not drive home if you are discharged the same day as the procedure. Have someone else drive you. °· You may drive 24 hours after the procedure unless otherwise instructed by your health care provider. °· Do not operate machinery or power tools for 24 hours after the procedure or as directed by your health care provider. °· If your procedure was done as an  outpatient procedure, which means that you went home the same day as your procedure, a responsible adult should be with you for the first 24 hours after you arrive home. °· Keep all follow-up visits as directed by your health care provider. This is important. °SEEK MEDICAL CARE IF: °· You have a fever. °· You have chills. °· You have increased bleeding from the catheter insertion site. Hold pressure on the site. °SEEK IMMEDIATE MEDICAL CARE IF: °· You have unusual pain at the catheter insertion site. °· You have redness, warmth, or swelling at the catheter insertion site. °· You have drainage (other than a small amount of blood on the dressing) from the catheter insertion site. °· The catheter insertion site is bleeding, and the bleeding does not stop after 30 minutes of holding steady pressure on the site. °· The area near or just beyond the catheter insertion site becomes pale, cool, tingly, or numb. °  °This information is not intended to replace advice given to you by your health care provider. Make sure you discuss any questions you have with your health care provider. °  °Document Released: 03/21/2005 Document Revised: 09/23/2014 Document Reviewed: 02/03/2013 °Elsevier Interactive Patient Education ©2016 [employer] ° °

## 2015-10-20 NOTE — Interval H&P Note (Signed)
History and Physical Interval Note:  10/20/2015 8:41 AM  [name]  has presented today for surgery, with the diagnosis of acculation of left bypass graft  The various methods of treatment have been discussed with the patient and family. After consideration of risks, benefits and other options for treatment, the patient has consented to  Procedure(s): Abdominal Aortogram (N/A) as a surgical intervention .  The patient's history has been reviewed, patient examined, no change in status, stable for surgery.  I have reviewed the patient's chart and labs.  Questions were answered to the patient's satisfaction.     Ruta Hinds

## 2015-10-20 NOTE — Op Note (Addendum)
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 The patient was placed in supine position the Angio table. Both groins were prepped and draped in usual sterile fashion. Local anesthesia was demonstrated over the left common femoral artery. Ultrasound was used to identify the left common femoral artery. Due to the patient's body habitus it was difficult to find a good acoustic window to the left common femoral artery. I attempted to stick the artery several times with a combination of fluoroscopy and ultrasound and was unsuccessful. Attention was then turned to the right groin. Using ultrasound guidance a right common femoral artery was identified. Local anesthesia was then threaded over this. Introducer needle was then used to cannulate the right common femoral artery in an 035 versacore were threaded up into the abdominal aorta under fluoroscopic guidance. A 5 French sheath was placed over the guidewire in the right common femoral artery. This was thoroughly flushed with heparinized saline. A 5 French pigtail catheter was then advanced over the guidewire up in the abdominal aorta. An abdominal aortogram was obtained in AP projection. The right renal arteries patent. There is 2 renal arteries on the left side. The inferior pole renal artery is a 50% mid-stenosis. The infrarenal abdominal aorta is patent. Left and right common internal and external  iliac arteries are patent.  The catheter was pulled down just above the bifurcation and a pelvic Angio was obtained confirming the above findings. Bilateral lower extremity runoff views were then performed using the pigtail catheter.  In the left lower extremity, the left common femoral profunda femoris and superficial femoral arteries are patent. There is a 50% stenosis of the proximal portion of the left popliteal artery. Popliteal artery is small. The origins of all 3 tibial vessels are patent but these are all quite small less than 1 mm diameter. The anterior tibial artery occludes in the distal leg. The peroneal and posterior tibial arteries aren't continuity. They are very small though and less than 1 mm in diameter to the left foot.  In the right lower extremity, the right common femoral artery is patent. The right profunda femoris is patent. The right superficial femoral artery is occluded. The right femoral popliteal bypass is also occluded. There is no reconstitution of the right superficial femoral artery or above-knee popliteal artery. The right popliteal artery does opacify some but the tibial vessels were not well visualized. This point the pigtail catheter was removed over guidewire and additional views were obtained through the sheath in the right groin. There was late filling of the popliteal artery. This artery is quite small approximate 1 mm in diameter. The peroneal and posterior tibial arteries also do opacify with contrast but these are even smaller less than a millimeter in diameter. The anterior tibial artery is occluded.  At this point the procedure was concluded. The 5 French sheath was left in place  to be pulled in the holding area. The patient tolerated the procedure well and there were no complications.  Operative management: The patient now has occluded her right femoral to below-knee popliteal bypass. This bypass graft last approximate 7 months. She does not have adequate  conduit for redo bypass in his overall a poor candidate due to her multiple comorbidities especially her COPD. She has very tiny outflow vessels and I do not believe this would be a durable bypass and due to the wound on her calf do not believe she is really a candidate for a redo bypass for a multitude of reasons. I believe the best option for her at this point would be a right above-knee amputation. She'll be scheduled for this on 10/30/2015. Patient was offered an earlier date this that she needed a week or so to think about this. I discussed with her that if she has increasing pain in the leg fever or worsening problems in her right leg that she should call our office during the daytime to avoid an emergency room visit for encouraging her she should have her amputation done sooner rather than later.  Ruta Hinds, MD [facility] Office: [phone] Pager: [phone]

## 2015-10-20 NOTE — H&P (View-Only) (Signed)
[facility] [place] HISTORY AND PHYSICAL   History of Present Illness:  Patient is a 65 year old female who presents for follow-up after right femoral to below-knee popliteal bypass and thrombectomy of her tibial vessels for acute on chronic ischemia. This was 7 months ago. She also had a fasciotomy and returns today for follow-up evaluation of her fasciotomy wound .  She is on Aspirin for antiplatelet therapy. She states she was in the hospital for a COPD exacerbation recently. During the time of that hospital admission a pre-existing right leg fasciotomy wound which had completely healed has now reopened. She does not complain of pain in her foot. However, today on her noninvasive vascular exam she was noted to have minimal flow in the right foot and occlusion of her right femoropopliteal bypass. Her atherosclerotic risk factors remain diabetes, hypertension, smoking.  She has not smoked for several months. These are all currently stable and followed by the primary care physician.  The patient was minimally active prior to her operation. She remains minimally active now and has to be encouraged to walk more. She is currently ambulating.  She had one vessel runoff via the posterior tibial artery at the conclusion of the case.      Past Medical History     Diagnosis    Date     .    Hypertension         .    Diabetes mellitus without complication         .    COPD (chronic obstructive pulmonary disease)         .    Stroke         .    Chronic back pain             Past Surgical History     Procedure    Laterality    Date     .    Back fusion             .    Cholecystectomy             .    Foot surgery    Bilateral         .    Cardiac catheterization             .    Peripheral vascular catheterization    N/A    04/10/2015             Procedure: Abdominal Aortogram w/Lower Extremity;  Surgeon: Conrad Spartanburg, MD;  Location: [facility];  Service: Cardiovascular;   Laterality: N/A;     .    Thrombectomy femoral artery    Right    04/10/2015             Procedure: THROMBECTOMY RIGHT TIBIAL  ARTERY WITH VEIN PATCH ANGIOPLASTY;  Surgeon: Elam Dutch, MD;  Location: [facility];  Service: Vascular;  Laterality: Right;     .    Fasciotomy    Right    04/10/2015             Procedure: FOUR COMPARTMENT FASCIOTOMY;  Surgeon: Elam Dutch, MD;  Location: [facility] OR;  Service: Vascular;  Laterality: Right;     .    Femoral-popliteal bypass graft    Right    04/10/2015             Procedure: RIGHT  FEMORAL-BELOW THE KNEE POPLITEAL ARTERY BYPASS GRAFT  USING 6 MM X 80 CM PROPATEN GRAFT;  Surgeon: Elam Dutch, MD;  Location: [facility] OR;  Service: Vascular;  Laterality: Right;     .    Intraoperative arteriogram    Right    04/10/2015             Procedure: INTRA OPERATIVE ARTERIOGRAM;  Surgeon: Elam Dutch, MD;  Location: [facility] OR;  Service: Vascular;  Laterality: Right;       Physical Examination Filed Vitals:   10/19/15 1049 10/19/15 1058  BP: 145/88 141/84  Pulse: 119 118  Temp: 98 F (36.7 C)   TempSrc: Oral   Resp: 18   Height: 5\' 6"  (1.676 m)   Weight: 179 lb (81.194 kg)   SpO2: 99%       General:  Alert and oriented, no acute distress Neurologic: Right foot drop, no skin ulcers on either foot, right leg is cool from the knee to the foot. Right foot is dusky in appearance. There is a 7 x 3 cm open fasciotomy wound on the lateral aspect of the right leg with minimal granulation tissue and some necrotic exudate. Extremities:  Right leg edematous extending from the mid thigh down to the foot right groin incision and the incision well-healed  Skin: no ulcer or rash the exception listed above  Data: Patient had bilateral ABIs performed today which were minimal flow noted on the right 0.96 on the left with biphasic waveforms. Graft duplex showed no increased velocities throughout the bypass graft previously but today this is occluded  ASSESSMENT: Wound breakdown  of right leg fasciotomy with occlusion of right femoropopliteal bypass graft. Patient is currently asymptomatic. Difficult to know how long the graft has been occluded. At this point no she is certainly at risk of limb loss that she has minimal flow to the right foot.Marland Kitchen   PLAN:  aortogram bilateral lower extremity runoff tomorrow to see if she is a potential candidate for redo bypass. Patient states she may not have transportation tomorrow. I emphasized to her that she is at high risk of limb loss. She will try to get the procedure scheduled as soon as possible. Risks benefits possible palpitations and procedure details were discussed with the patient today including but not limited to bleeding infection and high risk of limb loss if she does not have revascularization  Ruta Hinds, MD [facility] [place]: [phone] Pager: [phone]

## 2015-10-20 NOTE — Progress Notes (Addendum)
Site area: rt groin fa sheath Site Prior to Removal:  Level 0 Pressure Applied For:  20 minutes Manual:   yes Patient Status During Pull:  stable Post Pull Site:  Level  0 Post Pull Instructions Given:  yes Post Pull Pulses Present: pulses rt foot absent-MD aware Dressing Applied:  tegaderm Bedrest begins @ 1330 Comments:

## 2015-10-23 ENCOUNTER — Encounter (HOSPITAL_COMMUNITY): Payer: Self-pay | Admitting: Vascular Surgery

## 2015-10-23 MED FILL — Morphine Sulfate Inj 4 MG/ML: INTRAMUSCULAR | Qty: 1 | Status: AC

## 2015-10-27 ENCOUNTER — Encounter (HOSPITAL_COMMUNITY)
Admission: RE | Admit: 2015-10-27 | Discharge: 2015-10-27 | Disposition: A | Discharge status: Home / Self Care | Payer: Medicare Other | Source: Ambulatory Visit | Attending: Vascular Surgery | Admitting: Vascular Surgery

## 2015-10-27 ENCOUNTER — Encounter (HOSPITAL_COMMUNITY): Payer: Self-pay

## 2015-10-27 DIAGNOSIS — K219 Gastro-esophageal reflux disease without esophagitis: Secondary | ICD-10-CM | POA: Insufficient documentation

## 2015-10-27 DIAGNOSIS — Z7982 Long term (current) use of aspirin: Secondary | ICD-10-CM

## 2015-10-27 DIAGNOSIS — I10 Essential (primary) hypertension: Secondary | ICD-10-CM | POA: Insufficient documentation

## 2015-10-27 DIAGNOSIS — M1990 Unspecified osteoarthritis, unspecified site: Secondary | ICD-10-CM | POA: Insufficient documentation

## 2015-10-27 DIAGNOSIS — I739 Peripheral vascular disease, unspecified: Secondary | ICD-10-CM

## 2015-10-27 DIAGNOSIS — Z01812 Encounter for preprocedural laboratory examination: Secondary | ICD-10-CM

## 2015-10-27 DIAGNOSIS — Z01818 Encounter for other preprocedural examination: Secondary | ICD-10-CM

## 2015-10-27 DIAGNOSIS — E119 Type 2 diabetes mellitus without complications: Secondary | ICD-10-CM | POA: Insufficient documentation

## 2015-10-27 DIAGNOSIS — Z794 Long term (current) use of insulin: Secondary | ICD-10-CM

## 2015-10-27 DIAGNOSIS — Z79899 Other long term (current) drug therapy: Secondary | ICD-10-CM

## 2015-10-27 HISTORY — DX: Gastro-esophageal reflux disease without esophagitis: K21.9

## 2015-10-27 HISTORY — DX: Unspecified osteoarthritis, unspecified site: M19.90

## 2015-10-27 HISTORY — DX: Peripheral vascular disease, unspecified: I73.9

## 2015-10-27 LAB — CBC
HEMATOCRIT: 32.8 % — AB (ref 36.0–46.0)
Hemoglobin: 9.9 g/dL — ABNORMAL LOW (ref 12.0–15.0)
MCH: 25.7 pg — ABNORMAL LOW (ref 26.0–34.0)
MCHC: 30.2 g/dL (ref 30.0–36.0)
MCV: 85.2 fL (ref 78.0–100.0)
PLATELETS: 396 10*3/uL (ref 150–400)
RBC: 3.85 MIL/uL — ABNORMAL LOW (ref 3.87–5.11)
RDW: 17.7 % — AB (ref 11.5–15.5)
WBC: 13.9 10*3/uL — AB (ref 4.0–10.5)

## 2015-10-27 LAB — GLUCOSE, CAPILLARY: Glucose-Capillary: 219 mg/dL — ABNORMAL HIGH (ref 65–99)

## 2015-10-27 LAB — COMPREHENSIVE METABOLIC PANEL
ALBUMIN: 2.8 g/dL — AB (ref 3.5–5.0)
ALK PHOS: 124 U/L (ref 38–126)
ALT: 20 U/L (ref 14–54)
ANION GAP: 12 (ref 5–15)
AST: 40 U/L (ref 15–41)
BILIRUBIN TOTAL: 0.8 mg/dL (ref 0.3–1.2)
BUN: 12 mg/dL (ref 6–20)
CALCIUM: 9.1 mg/dL (ref 8.9–10.3)
CO2: 27 mmol/L (ref 22–32)
Chloride: 99 mmol/L — ABNORMAL LOW (ref 101–111)
Creatinine, Ser: 1 mg/dL (ref 0.44–1.00)
GFR, EST NON AFRICAN AMERICAN: 58 mL/min — AB (ref >60)
Glucose, Bld: 236 mg/dL — ABNORMAL HIGH (ref 65–99)
POTASSIUM: 4.5 mmol/L (ref 3.5–5.1)
SODIUM: 138 mmol/L (ref 135–145)
TOTAL PROTEIN: 6.2 g/dL — AB (ref 6.5–8.1)

## 2015-10-27 MED ORDER — VANCOMYCIN HCL IN DEXTROSE 1-5 GM/200ML-% IV SOLN
1000.0000 mg | INTRAVENOUS | Status: AC
  Administered 2015-10-30: 1000 mg via INTRAVENOUS
  Filled 2015-10-27 (×2): qty 200

## 2015-10-27 MED ORDER — SODIUM CHLORIDE 0.9 % IV SOLN: INTRAVENOUS | Status: DC

## 2015-10-27 MED ORDER — CHLORHEXIDINE GLUCONATE CLOTH 2 % EX PADS: 6.0000 | MEDICATED_PAD | Freq: Once | CUTANEOUS | Status: DC

## 2015-10-27 NOTE — Pre-Procedure Instructions (Addendum)
JANYA SURTI  10/27/2015      [facility] - [place] [address] Phone: [phone] Fax: [phone]    Your procedure is scheduled on 10/30/15.  Report to [facility] Admitting at 950 A.M.  Call this number if you have problems the morning of surgery:  [phone]   Remember:  Do not eat food or drink liquids after midnight.  Take these medicines the morning of surgery with A SIP OF WATER metoprolol,protonix, celexa         ,oxycodone, xanax if needed,   STOP all herbel meds, nsaids (aleve,naproxen,advil,ibuprofen) starting NOW including vitamins,naproxen    no metformin am of surgery           Aspirin per dr  How to Manage Your Diabetes Before Surgery   Why is it important to control my blood sugar before and after surgery?   Improving blood sugar levels before and after surgery helps healing and can limit problems.  A way of improving blood sugar control is eating a healthy diet by:  - Eating less sugar and carbohydrates  - Increasing activity/exercise  - Talk with your doctor about reaching your blood sugar goals  High blood sugars (greater than 180 mg/dL) can raise your risk of infections and slow down your recovery so you will need to focus on controlling your diabetes during the weeks before surgery.  Make sure that the doctor who takes care of your diabetes knows about your planned surgery including the date and location.  How do I manage my blood sugars before surgery?   Check your blood sugar at least 4 times a day, 2 days before surgery to make sure that they are not too high or low.   Check your blood sugar the morning of your surgery when you wake up and every 2               hours until you get to the Short-Stay unit.  If your blood sugar is less than 70 mg/dL, you will need to treat for low blood sugar by:  Treat a low blood sugar (less than 70 mg/dL) with 1/2 cup of clear juice  (cranberry or apple), 4 glucose tablets, OR glucose gel.  Recheck blood sugar in 15 minutes after treatment (to make sure it is greater than 70 mg/dL).  If blood sugar is not greater than 70 mg/dL on re-check, call [phone] for further instructions.   Report your blood sugar to the Short-Stay nurse when you get to Short-Stay.  References:  [facility], 2007 "How to Manage your Diabetes Before and After Surgery".  What do I do about my diabetes medications?   Do not take oral diabetes medicines (pills) the morning of surgery (metformin).    THE NIGHT BEFORE SURGERY, take 23 units of lantus Insulin.       Do not wear jewelry, make-up or nail polish.  Do not wear lotions, powders, or perfumes.  You may wear deodorant.  Do not shave 48 hours prior to surgery.  Men may shave face and neck.  Do not bring valuables to the hospital.  [facility] is not responsible for any belongings or valuables.  Contacts, dentures or bridgework may not be worn into surgery.  Leave your suitcase in the car.  After surgery it may be brought to your room.  For patients admitted to the hospital, discharge time will be  determined by your treatment team.  Patients discharged the day of surgery will not be allowed to drive home.   Name and phone number of your driver:    Special instructions:   Special Instructions: [place] - Preparing for Surgery  Before surgery, you can play an important role.  Because skin is not sterile, your skin needs to be as free of germs as possible.  You can reduce the number of germs on you skin by washing with CHG (chlorahexidine gluconate) soap before surgery.  CHG is an antiseptic cleaner which kills germs and bonds with the skin to continue killing germs even after washing.  Please DO NOT use if you have an allergy to CHG or antibacterial soaps.  If your skin becomes reddened/irritated stop using the CHG and inform your nurse when you arrive  at Short Stay.  Do not shave (including legs and underarms) for at least 48 hours prior to the first CHG shower.  You may shave your face.  Please follow these instructions carefully:   1.  Shower with CHG Soap the night before surgery and the morning of Surgery.  2.  If you choose to wash your hair, wash your hair first as usual with your normal shampoo.  3.  After you shampoo, rinse your hair and body thoroughly to remove the Shampoo.  4.  Use CHG as you would any other liquid soap.  You can apply chg directly  to the skin and wash gently with scrungie or a clean washcloth.  5.  Apply the CHG Soap to your body ONLY FROM THE NECK DOWN.  Do not use on open wounds or open sores.  Avoid contact with your eyes ears, mouth and genitals (private parts).  Wash genitals (private parts)       with your normal soap.  6.  Wash thoroughly, paying special attention to the area where your surgery will be performed.  7.  Thoroughly rinse your body with warm water from the neck down.  8.  DO NOT shower/wash with your normal soap after using and rinsing off the CHG Soap.  9.  Pat yourself dry with a clean towel.            10.  Wear clean pajamas.            11.  Place clean sheets on your bed the night of your first shower and do not sleep with pets.  Day of Surgery  Do not apply any lotions/deodorants the morning of surgery.  Please wear clean clothes to the hospital/surgery center.  Please read over the following fact sheets that you were given. Pain Booklet, Coughing and Deep Breathing and Surgical Site Infection Prevention

## 2015-10-27 NOTE — Progress Notes (Signed)
Anesthesia Chart Review: Patient is a 65 year old female scheduled for right AKA on 10/30/15 by Dr. Oneida Alar. PAT on 10/27/15 PM, and chart reviewed after 5 PM. She has a history of FPBG which is now occluded. It was not felt that she had an good redo bypass options.  History includes smoking, DM2, HTN, GERD, PAD, right FPBG and tibial thrombectomy 04/10/15 (acute admission), right carotid endarterectomy '10, arthritis, cholecystectomy. PCP is Dr. Ayesha Rumpf [facility]. Cardiologist is Dr. Mertie Moores who saw her on 10/06/15. He noted her ST had resolved and her recent echo showed normal LV function. He recommended PRN cardiology follow-up.  Meds include Xanax, aspirin, Lipitor, albuterol, Celexa, Neurontin, Lantus, Duoneb, lisinopril, metformin, Lopressor, Percocet, Chantix, Protonix.  10/06/15 EKG: NSR, LAD, cannot rule out anterior infarct (age undetermined).  08/18/15 Echo: Study Conclusions - Left ventricle: The cavity size was normal. Wall thickness was normal. Systolic function was normal. The estimated ejection fraction was in the range of 50% to 55%. Indeterminate diastolic function. - Regional wall motion abnormality: Hypokinesis of the mid anterior and basal-mid anteroseptal myocardium. - Aortic valve: Valve area (VTI): 3.18 cm^2. Valve area (Vmax): 2.16 cm^2. - Right ventricle: The cavity size was mildly to moderately dilated. - Right atrium: The atrium was mildly dilated. - Atrial septum: No defect or patent foramen ovale was identified. - Pulmonary arteries: Systolic pressure was mildly to moderately increased. PA peak pressure: 39 mm Hg (S). - Inferior vena cava: The vessel was dilated. The respirophasic diameter changes were blunted (< 50%), consistent with elevated central venous pressure. - Technically difficult study. Echocontrast was used to enhance visualization. (Comparison echo 04/13/15 showed EF 45-50%, possible septal hypokinesis.)  08/17/15 CXR:  IMPRESSION: Increasing cardiomegaly, with findings of interstitial pulmonary edema. Small to moderate LEFT pleural effusion.  Preoperative labs noted. H/H 9.9/32.8. A1c is is process. Will order a T&S due to anemia.   She underwent left FPBG 03/2015. Cardiology was consulted for post-operative tachycardia/SVT and hypotension. Started on metoprolol. Echo done showing possible hypokinesis, but no additional testing ordered. She did have hospitalization for CHF exacerbation 08/2015 that was managed by her PCP who was the admitting physician. She had a repeat echo as above. She has had recent  follow-up with Dr. Acie Fredrickson with PRN cardiology follow-up recommended, but does not have a formal cardiac or medical clearance for surgery. Discussed with anesthesiologist Dr. Ermalene Postin. Patient will be assessed on the day of surgery. Definitive anesthesia plan at that time.  [name] [facility] Short Stay Center/Anesthesiology Phone [phone] 10/27/2015 7:04 PM

## 2015-10-28 LAB — HEMOGLOBIN A1C
Hgb A1c MFr Bld: 8.6 % — ABNORMAL HIGH (ref 4.8–5.6)
Mean Plasma Glucose: 200 mg/dL

## 2015-10-30 ENCOUNTER — Encounter (HOSPITAL_COMMUNITY): Payer: Self-pay | Admitting: Certified Registered Nurse Anesthetist

## 2015-10-30 ENCOUNTER — Inpatient Hospital Stay (HOSPITAL_COMMUNITY): Payer: Medicare Other | Admitting: Certified Registered Nurse Anesthetist

## 2015-10-30 ENCOUNTER — Inpatient Hospital Stay (HOSPITAL_COMMUNITY): Payer: Medicare Other | Admitting: Vascular Surgery

## 2015-10-30 ENCOUNTER — Inpatient Hospital Stay (HOSPITAL_COMMUNITY)
Admission: RE | Admit: 2015-10-30 | Discharge: 2015-11-03 | DRG: 240 | Disposition: A | Discharge status: Skilled Nursing Facility | Payer: Medicare Other | Source: Ambulatory Visit | Attending: Vascular Surgery | Admitting: Vascular Surgery

## 2015-10-30 ENCOUNTER — Encounter (HOSPITAL_COMMUNITY)
Admission: RE | Disposition: A | Discharge status: Skilled Nursing Facility | Payer: Self-pay | Source: Ambulatory Visit | Attending: Vascular Surgery

## 2015-10-30 DIAGNOSIS — Y832 Surgical operation with anastomosis, bypass or graft as the cause of abnormal reaction of the patient, or of later complication, without mention of misadventure at the time of the procedure: Secondary | ICD-10-CM | POA: Diagnosis present

## 2015-10-30 DIAGNOSIS — Z7982 Long term (current) use of aspirin: Secondary | ICD-10-CM

## 2015-10-30 DIAGNOSIS — Z8673 Personal history of transient ischemic attack (TIA), and cerebral infarction without residual deficits: Secondary | ICD-10-CM

## 2015-10-30 DIAGNOSIS — J449 Chronic obstructive pulmonary disease, unspecified: Secondary | ICD-10-CM | POA: Diagnosis present

## 2015-10-30 DIAGNOSIS — E785 Hyperlipidemia, unspecified: Secondary | ICD-10-CM | POA: Diagnosis present

## 2015-10-30 DIAGNOSIS — E1152 Type 2 diabetes mellitus with diabetic peripheral angiopathy with gangrene: Secondary | ICD-10-CM | POA: Diagnosis present

## 2015-10-30 DIAGNOSIS — D62 Acute posthemorrhagic anemia: Secondary | ICD-10-CM | POA: Diagnosis not present

## 2015-10-30 DIAGNOSIS — F17210 Nicotine dependence, cigarettes, uncomplicated: Secondary | ICD-10-CM | POA: Diagnosis present

## 2015-10-30 DIAGNOSIS — E1142 Type 2 diabetes mellitus with diabetic polyneuropathy: Secondary | ICD-10-CM | POA: Insufficient documentation

## 2015-10-30 DIAGNOSIS — K219 Gastro-esophageal reflux disease without esophagitis: Secondary | ICD-10-CM | POA: Diagnosis present

## 2015-10-30 DIAGNOSIS — G8918 Other acute postprocedural pain: Secondary | ICD-10-CM | POA: Insufficient documentation

## 2015-10-30 DIAGNOSIS — G8929 Other chronic pain: Secondary | ICD-10-CM | POA: Diagnosis present

## 2015-10-30 DIAGNOSIS — I70201 Unspecified atherosclerosis of native arteries of extremities, right leg: Secondary | ICD-10-CM | POA: Diagnosis present

## 2015-10-30 DIAGNOSIS — N179 Acute kidney failure, unspecified: Secondary | ICD-10-CM | POA: Diagnosis not present

## 2015-10-30 DIAGNOSIS — Z794 Long term (current) use of insulin: Secondary | ICD-10-CM

## 2015-10-30 DIAGNOSIS — F419 Anxiety disorder, unspecified: Secondary | ICD-10-CM | POA: Diagnosis present

## 2015-10-30 DIAGNOSIS — T82898A Other specified complication of vascular prosthetic devices, implants and grafts, initial encounter: Principal | ICD-10-CM | POA: Diagnosis present

## 2015-10-30 DIAGNOSIS — Z8249 Family history of ischemic heart disease and other diseases of the circulatory system: Secondary | ICD-10-CM | POA: Diagnosis not present

## 2015-10-30 DIAGNOSIS — T82858A Stenosis of vascular prosthetic devices, implants and grafts, initial encounter: Secondary | ICD-10-CM | POA: Diagnosis present

## 2015-10-30 DIAGNOSIS — Z823 Family history of stroke: Secondary | ICD-10-CM

## 2015-10-30 DIAGNOSIS — I959 Hypotension, unspecified: Secondary | ICD-10-CM | POA: Diagnosis not present

## 2015-10-30 DIAGNOSIS — R269 Unspecified abnormalities of gait and mobility: Secondary | ICD-10-CM | POA: Insufficient documentation

## 2015-10-30 DIAGNOSIS — M545 Low back pain, unspecified: Secondary | ICD-10-CM | POA: Insufficient documentation

## 2015-10-30 DIAGNOSIS — E860 Dehydration: Secondary | ICD-10-CM | POA: Diagnosis present

## 2015-10-30 DIAGNOSIS — D72829 Elevated white blood cell count, unspecified: Secondary | ICD-10-CM | POA: Insufficient documentation

## 2015-10-30 DIAGNOSIS — E0810 Diabetes mellitus due to underlying condition with ketoacidosis without coma: Secondary | ICD-10-CM

## 2015-10-30 DIAGNOSIS — I10 Essential (primary) hypertension: Secondary | ICD-10-CM | POA: Diagnosis present

## 2015-10-30 DIAGNOSIS — G546 Phantom limb syndrome with pain: Secondary | ICD-10-CM | POA: Insufficient documentation

## 2015-10-30 DIAGNOSIS — E1165 Type 2 diabetes mellitus with hyperglycemia: Secondary | ICD-10-CM

## 2015-10-30 DIAGNOSIS — R0689 Other abnormalities of breathing: Secondary | ICD-10-CM | POA: Insufficient documentation

## 2015-10-30 DIAGNOSIS — Z89619 Acquired absence of unspecified leg above knee: Secondary | ICD-10-CM | POA: Insufficient documentation

## 2015-10-30 DIAGNOSIS — R0902 Hypoxemia: Secondary | ICD-10-CM | POA: Diagnosis not present

## 2015-10-30 DIAGNOSIS — I509 Heart failure, unspecified: Secondary | ICD-10-CM | POA: Diagnosis not present

## 2015-10-30 DIAGNOSIS — Z89611 Acquired absence of right leg above knee: Secondary | ICD-10-CM | POA: Diagnosis not present

## 2015-10-30 DIAGNOSIS — I739 Peripheral vascular disease, unspecified: Secondary | ICD-10-CM | POA: Insufficient documentation

## 2015-10-30 HISTORY — DX: Low back pain, unspecified: M54.50

## 2015-10-30 HISTORY — DX: Other chronic pain: G89.29

## 2015-10-30 HISTORY — DX: Type 2 diabetes mellitus without complications: E11.9

## 2015-10-30 HISTORY — PX: AMPUTATION: SHX166

## 2015-10-30 HISTORY — DX: Hyperlipidemia, unspecified: E78.5

## 2015-10-30 HISTORY — DX: Low back pain: M54.5

## 2015-10-30 HISTORY — DX: Anxiety disorder, unspecified: F41.9

## 2015-10-30 HISTORY — PX: LEG AMPUTATION THROUGH KNEE: SHX696

## 2015-10-30 LAB — GLUCOSE, CAPILLARY
GLUCOSE-CAPILLARY: 157 mg/dL — AB (ref 65–99)
GLUCOSE-CAPILLARY: 165 mg/dL — AB (ref 65–99)
Glucose-Capillary: 154 mg/dL — ABNORMAL HIGH (ref 65–99)
Glucose-Capillary: 247 mg/dL — ABNORMAL HIGH (ref 65–99)

## 2015-10-30 LAB — CBC
HCT: 32.3 % — ABNORMAL LOW (ref 36.0–46.0)
Hemoglobin: 10 g/dL — ABNORMAL LOW (ref 12.0–15.0)
MCH: 26.4 pg (ref 26.0–34.0)
MCHC: 31 g/dL (ref 30.0–36.0)
MCV: 85.2 fL (ref 78.0–100.0)
PLATELETS: 424 10*3/uL — AB (ref 150–400)
RBC: 3.79 MIL/uL — ABNORMAL LOW (ref 3.87–5.11)
RDW: 17.8 % — AB (ref 11.5–15.5)
WBC: 13.8 10*3/uL — ABNORMAL HIGH (ref 4.0–10.5)

## 2015-10-30 LAB — TYPE AND SCREEN
ABO/RH(D): O POS
Antibody Screen: NEGATIVE

## 2015-10-30 LAB — CREATININE, SERUM
CREATININE: 0.9 mg/dL (ref 0.44–1.00)
GFR calc Af Amer: >60 mL/min (ref >60)

## 2015-10-30 SURGERY — AMPUTATION, ABOVE KNEE
Anesthesia: General | Site: Leg Upper | Laterality: Right

## 2015-10-30 MED ORDER — DEXAMETHASONE SODIUM PHOSPHATE 10 MG/ML IJ SOLN
INTRAMUSCULAR | Status: DC | PRN
  Administered 2015-10-30: 10 mg via INTRAVENOUS

## 2015-10-30 MED ORDER — EPHEDRINE SULFATE 50 MG/ML IJ SOLN
INTRAMUSCULAR | Status: DC | PRN
  Administered 2015-10-30: 10 mg via INTRAVENOUS

## 2015-10-30 MED ORDER — MORPHINE SULFATE 2 MG/ML IV SOLN
INTRAVENOUS | Status: AC
  Filled 2015-10-30: qty 25

## 2015-10-30 MED ORDER — METOPROLOL TARTRATE 1 MG/ML IV SOLN: 2.0000 mg | INTRAVENOUS | Status: DC | PRN

## 2015-10-30 MED ORDER — ALUM & MAG HYDROXIDE-SIMETH 200-200-20 MG/5ML PO SUSP
15.0000 mL | ORAL | Status: DC | PRN
  Administered 2015-11-02: 30 mL via ORAL
  Filled 2015-10-30: qty 30

## 2015-10-30 MED ORDER — POTASSIUM CHLORIDE CRYS ER 20 MEQ PO TBCR
20.0000 meq | EXTENDED_RELEASE_TABLET | Freq: Every day | ORAL | Status: DC | PRN
Start: 2015-10-30 — End: 2015-11-03

## 2015-10-30 MED ORDER — GLYCOPYRROLATE 0.2 MG/ML IJ SOLN
INTRAMUSCULAR | Status: DC | PRN
  Administered 2015-10-30: 0.6 mg via INTRAVENOUS

## 2015-10-30 MED ORDER — DOCUSATE SODIUM 100 MG PO CAPS
100.0000 mg | ORAL_CAPSULE | Freq: Every day | ORAL | Status: DC
  Administered 2015-10-31 – 2015-11-03 (×4): 100 mg via ORAL
  Filled 2015-10-30 (×4): qty 1

## 2015-10-30 MED ORDER — PROMETHAZINE HCL 25 MG/ML IJ SOLN
6.2500 mg | INTRAMUSCULAR | Status: DC | PRN
Start: 2015-10-30 — End: 2015-10-30

## 2015-10-30 MED ORDER — HYDROMORPHONE HCL 1 MG/ML IJ SOLN
INTRAMUSCULAR | Status: AC
  Filled 2015-10-30: qty 1

## 2015-10-30 MED ORDER — LACTATED RINGERS IV SOLN
INTRAVENOUS | Status: DC
  Administered 2015-10-30 (×3): via INTRAVENOUS

## 2015-10-30 MED ORDER — ALBUTEROL SULFATE HFA 108 (90 BASE) MCG/ACT IN AERS
INHALATION_SPRAY | RESPIRATORY_TRACT | Status: AC
  Filled 2015-10-30: qty 6.7

## 2015-10-30 MED ORDER — ENOXAPARIN SODIUM 40 MG/0.4ML ~~LOC~~ SOLN
40.0000 mg | SUBCUTANEOUS | Status: DC
  Administered 2015-10-31 – 2015-11-03 (×4): 40 mg via SUBCUTANEOUS
  Filled 2015-10-30 (×4): qty 0.4

## 2015-10-30 MED ORDER — EPHEDRINE SULFATE 50 MG/ML IJ SOLN
INTRAMUSCULAR | Status: AC
  Filled 2015-10-30: qty 1

## 2015-10-30 MED ORDER — ONDANSETRON HCL 4 MG/2ML IJ SOLN
INTRAMUSCULAR | Status: AC
  Filled 2015-10-30: qty 2

## 2015-10-30 MED ORDER — FENTANYL CITRATE (PF) 250 MCG/5ML IJ SOLN
INTRAMUSCULAR | Status: AC
  Filled 2015-10-30: qty 5

## 2015-10-30 MED ORDER — METFORMIN HCL 500 MG PO TABS
500.0000 mg | ORAL_TABLET | Freq: Two times a day (BID) | ORAL | Status: DC
  Administered 2015-10-31 – 2015-11-03 (×7): 500 mg via ORAL
  Filled 2015-10-30 (×7): qty 1

## 2015-10-30 MED ORDER — STERILE WATER FOR INJECTION IJ SOLN
INTRAMUSCULAR | Status: AC
  Filled 2015-10-30: qty 10

## 2015-10-30 MED ORDER — LIDOCAINE HCL (CARDIAC) 20 MG/ML IV SOLN
INTRAVENOUS | Status: DC | PRN
  Administered 2015-10-30: 60 mg via INTRAVENOUS

## 2015-10-30 MED ORDER — CITALOPRAM HYDROBROMIDE 20 MG PO TABS
20.0000 mg | ORAL_TABLET | Freq: Every day | ORAL | Status: DC
Start: 2015-10-30 — End: 2015-11-03
  Administered 2015-10-30 – 2015-11-03 (×5): 20 mg via ORAL
  Filled 2015-10-30 (×5): qty 1

## 2015-10-30 MED ORDER — NEOSTIGMINE METHYLSULFATE 10 MG/10ML IV SOLN
INTRAVENOUS | Status: DC | PRN
  Administered 2015-10-30: 4 mg via INTRAVENOUS

## 2015-10-30 MED ORDER — ACETAMINOPHEN 650 MG RE SUPP: 325.0000 mg | RECTAL | Status: DC | PRN

## 2015-10-30 MED ORDER — PHENOL 1.4 % MT LIQD
1.0000 | OROMUCOSAL | Status: DC | PRN
Start: 2015-10-30 — End: 2015-11-03

## 2015-10-30 MED ORDER — DIPHENHYDRAMINE HCL 50 MG/ML IJ SOLN: 12.5000 mg | Freq: Four times a day (QID) | INTRAMUSCULAR | Status: DC | PRN

## 2015-10-30 MED ORDER — ROCURONIUM BROMIDE 50 MG/5ML IV SOLN
INTRAVENOUS | Status: AC
  Filled 2015-10-30: qty 1

## 2015-10-30 MED ORDER — MORPHINE SULFATE 2 MG/ML IV SOLN
INTRAVENOUS | Status: DC
  Administered 2015-10-30: 50 mg via INTRAVENOUS
  Administered 2015-10-31: 4 mg via INTRAVENOUS
  Administered 2015-10-31: 1 mg via INTRAVENOUS
  Administered 2015-10-31: 2 mg via INTRAVENOUS
  Administered 2015-11-01: 08:00:00 via INTRAVENOUS
  Administered 2015-11-01: 3 mg via INTRAVENOUS
  Administered 2015-11-01: 4 mg via INTRAVENOUS
  Administered 2015-11-01: 1 mg via INTRAVENOUS
  Filled 2015-10-30: qty 25

## 2015-10-30 MED ORDER — IPRATROPIUM-ALBUTEROL 0.5-2.5 (3) MG/3ML IN SOLN
3.0000 mL | Freq: Three times a day (TID) | RESPIRATORY_TRACT | Status: DC
  Administered 2015-10-30 – 2015-10-31 (×3): 3 mL via RESPIRATORY_TRACT
  Filled 2015-10-30 (×3): qty 3

## 2015-10-30 MED ORDER — HEPARIN SODIUM (PORCINE) 1000 UNIT/ML IJ SOLN
INTRAMUSCULAR | Status: AC
  Filled 2015-10-30: qty 1

## 2015-10-30 MED ORDER — PROPOFOL 10 MG/ML IV BOLUS
INTRAVENOUS | Status: DC | PRN
  Administered 2015-10-30: 120 mg via INTRAVENOUS
  Administered 2015-10-30: 30 mg via INTRAVENOUS

## 2015-10-30 MED ORDER — ALBUTEROL SULFATE (2.5 MG/3ML) 0.083% IN NEBU
3.0000 mL | INHALATION_SOLUTION | Freq: Four times a day (QID) | RESPIRATORY_TRACT | Status: DC | PRN
  Administered 2015-10-31: 3 mL via RESPIRATORY_TRACT

## 2015-10-30 MED ORDER — PANTOPRAZOLE SODIUM 40 MG PO TBEC
40.0000 mg | DELAYED_RELEASE_TABLET | Freq: Every day | ORAL | Status: DC
  Administered 2015-10-30 – 2015-11-02 (×4): 40 mg via ORAL
  Filled 2015-10-30 (×4): qty 1

## 2015-10-30 MED ORDER — DIPHENHYDRAMINE HCL 12.5 MG/5ML PO ELIX: 12.5000 mg | ORAL_SOLUTION | Freq: Four times a day (QID) | ORAL | Status: DC | PRN

## 2015-10-30 MED ORDER — GABAPENTIN 300 MG PO CAPS
300.0000 mg | ORAL_CAPSULE | Freq: Three times a day (TID) | ORAL | Status: DC
  Administered 2015-10-30 – 2015-11-03 (×11): 300 mg via ORAL
  Filled 2015-10-30 (×11): qty 1

## 2015-10-30 MED ORDER — MAGNESIUM HYDROXIDE 400 MG/5ML PO SUSP
30.0000 mL | Freq: Every day | ORAL | Status: DC | PRN
  Filled 2015-10-30: qty 30

## 2015-10-30 MED ORDER — MIDAZOLAM HCL 2 MG/2ML IJ SOLN
INTRAMUSCULAR | Status: AC
  Filled 2015-10-30: qty 2

## 2015-10-30 MED ORDER — LIDOCAINE HCL (CARDIAC) 20 MG/ML IV SOLN
INTRAVENOUS | Status: AC
  Filled 2015-10-30: qty 5

## 2015-10-30 MED ORDER — NEOSTIGMINE METHYLSULFATE 10 MG/10ML IV SOLN
INTRAVENOUS | Status: AC
  Filled 2015-10-30: qty 1

## 2015-10-30 MED ORDER — LISINOPRIL 10 MG PO TABS
20.0000 mg | ORAL_TABLET | Freq: Every day | ORAL | Status: DC
  Administered 2015-10-30 – 2015-10-31 (×2): 20 mg via ORAL
  Filled 2015-10-30 (×3): qty 2

## 2015-10-30 MED ORDER — GLYCOPYRROLATE 0.2 MG/ML IJ SOLN
INTRAMUSCULAR | Status: AC
  Filled 2015-10-30: qty 3

## 2015-10-30 MED ORDER — ALPRAZOLAM 0.25 MG PO TABS: 0.2500 mg | ORAL_TABLET | Freq: Four times a day (QID) | ORAL | Status: DC | PRN

## 2015-10-30 MED ORDER — INSULIN GLARGINE 100 UNIT/ML ~~LOC~~ SOLN
34.0000 [IU] | Freq: Every day | SUBCUTANEOUS | Status: DC
  Administered 2015-10-30 – 2015-10-31 (×2): 34 [IU] via SUBCUTANEOUS
  Filled 2015-10-30 (×5): qty 0.34

## 2015-10-30 MED ORDER — INSULIN ASPART 100 UNIT/ML ~~LOC~~ SOLN
0.0000 [IU] | Freq: Three times a day (TID) | SUBCUTANEOUS | Status: DC
  Administered 2015-10-31 (×2): 5 [IU] via SUBCUTANEOUS
  Administered 2015-11-02: 2 [IU] via SUBCUTANEOUS

## 2015-10-30 MED ORDER — SODIUM CHLORIDE 0.9 % IV SOLN
INTRAVENOUS | Status: DC
  Administered 2015-10-31: 1000 mL via INTRAVENOUS
  Administered 2015-11-01: 75 mL/h via INTRAVENOUS
  Administered 2015-11-01: 07:00:00 via INTRAVENOUS
  Administered 2015-11-02: 75 mL/h via INTRAVENOUS
  Administered 2015-11-03: 08:00:00 via INTRAVENOUS

## 2015-10-30 MED ORDER — 0.9 % SODIUM CHLORIDE (POUR BTL) OPTIME
TOPICAL | Status: DC | PRN
  Administered 2015-10-30: 1000 mL

## 2015-10-30 MED ORDER — ACETAMINOPHEN 325 MG PO TABS
325.0000 mg | ORAL_TABLET | ORAL | Status: DC | PRN
  Administered 2015-11-01: 650 mg via ORAL
  Filled 2015-10-30 (×2): qty 2

## 2015-10-30 MED ORDER — ALBUTEROL SULFATE HFA 108 (90 BASE) MCG/ACT IN AERS
INHALATION_SPRAY | RESPIRATORY_TRACT | Status: DC | PRN
  Administered 2015-10-30: 5 via RESPIRATORY_TRACT

## 2015-10-30 MED ORDER — BISACODYL 10 MG RE SUPP: 10.0000 mg | Freq: Every day | RECTAL | Status: DC | PRN

## 2015-10-30 MED ORDER — ONDANSETRON HCL 4 MG/2ML IJ SOLN
4.0000 mg | Freq: Four times a day (QID) | INTRAMUSCULAR | Status: DC | PRN
  Administered 2015-11-01: 4 mg via INTRAVENOUS
  Filled 2015-10-30: qty 2

## 2015-10-30 MED ORDER — FENTANYL CITRATE (PF) 100 MCG/2ML IJ SOLN
INTRAMUSCULAR | Status: AC
  Administered 2015-10-30: 50 ug
  Filled 2015-10-30: qty 2

## 2015-10-30 MED ORDER — NALOXONE HCL 0.4 MG/ML IJ SOLN: 0.4000 mg | INTRAMUSCULAR | Status: DC | PRN

## 2015-10-30 MED ORDER — MAGNESIUM SULFATE 2 GM/50ML IV SOLN
2.0000 g | Freq: Every day | INTRAVENOUS | Status: DC | PRN
  Filled 2015-10-30: qty 50

## 2015-10-30 MED ORDER — FENTANYL CITRATE (PF) 100 MCG/2ML IJ SOLN
100.0000 ug | Freq: Once | INTRAMUSCULAR | Status: AC
  Administered 2015-10-30: 25 ug via INTRAVENOUS

## 2015-10-30 MED ORDER — DEXAMETHASONE SODIUM PHOSPHATE 4 MG/ML IJ SOLN
INTRAMUSCULAR | Status: AC
  Filled 2015-10-30: qty 1

## 2015-10-30 MED ORDER — METOPROLOL TARTRATE 25 MG PO TABS
25.0000 mg | ORAL_TABLET | Freq: Two times a day (BID) | ORAL | Status: DC
  Administered 2015-10-30 – 2015-10-31 (×3): 25 mg via ORAL
  Filled 2015-10-30 (×4): qty 1

## 2015-10-30 MED ORDER — ASPIRIN 325 MG PO TABS
325.0000 mg | ORAL_TABLET | Freq: Every day | ORAL | Status: DC
  Administered 2015-10-30 – 2015-11-03 (×5): 325 mg via ORAL
  Filled 2015-10-30 (×5): qty 1

## 2015-10-30 MED ORDER — HYDRALAZINE HCL 20 MG/ML IJ SOLN: 5.0000 mg | INTRAMUSCULAR | Status: DC | PRN

## 2015-10-30 MED ORDER — VANCOMYCIN HCL IN DEXTROSE 1-5 GM/200ML-% IV SOLN
1000.0000 mg | Freq: Two times a day (BID) | INTRAVENOUS | Status: AC
  Administered 2015-10-30 – 2015-10-31 (×2): 1000 mg via INTRAVENOUS
  Filled 2015-10-30 (×2): qty 200

## 2015-10-30 MED ORDER — ATORVASTATIN CALCIUM 20 MG PO TABS
20.0000 mg | ORAL_TABLET | Freq: Every day | ORAL | Status: DC
  Administered 2015-10-31 – 2015-11-02 (×3): 20 mg via ORAL
  Filled 2015-10-30 (×3): qty 1

## 2015-10-30 MED ORDER — VARENICLINE TARTRATE 1 MG PO TABS
1.0000 mg | ORAL_TABLET | Freq: Two times a day (BID) | ORAL | Status: DC
  Filled 2015-10-30 (×10): qty 1

## 2015-10-30 MED ORDER — HYDROMORPHONE HCL 1 MG/ML IJ SOLN
0.2500 mg | INTRAMUSCULAR | Status: DC | PRN
  Administered 2015-10-30 (×4): 0.5 mg via INTRAVENOUS

## 2015-10-30 MED ORDER — SODIUM CHLORIDE 0.9% FLUSH: 9.0000 mL | INTRAVENOUS | Status: DC | PRN

## 2015-10-30 MED ORDER — LABETALOL HCL 5 MG/ML IV SOLN: 10.0000 mg | INTRAVENOUS | Status: DC | PRN

## 2015-10-30 MED ORDER — DM-GUAIFENESIN ER 30-600 MG PO TB12: 1.0000 | ORAL_TABLET | Freq: Two times a day (BID) | ORAL | Status: DC | PRN

## 2015-10-30 MED ORDER — FENTANYL CITRATE (PF) 100 MCG/2ML IJ SOLN
INTRAMUSCULAR | Status: DC | PRN
  Administered 2015-10-30: 100 ug via INTRAVENOUS
  Administered 2015-10-30: 50 ug via INTRAVENOUS

## 2015-10-30 MED ORDER — ROCURONIUM BROMIDE 100 MG/10ML IV SOLN
INTRAVENOUS | Status: DC | PRN
  Administered 2015-10-30: 40 mg via INTRAVENOUS

## 2015-10-30 MED ORDER — ONDANSETRON HCL 4 MG/2ML IJ SOLN
INTRAMUSCULAR | Status: DC | PRN
  Administered 2015-10-30: 4 mg via INTRAVENOUS

## 2015-10-30 SURGICAL SUPPLY — 46 items
BANDAGE ELASTIC 6 VELCRO ST LF (GAUZE/BANDAGES/DRESSINGS) ×2 IMPLANT
BLADE SAW RECIP 87.9 MT (BLADE) ×2 IMPLANT
BNDG COHESIVE 4X5 TAN STRL (GAUZE/BANDAGES/DRESSINGS) ×2 IMPLANT
BNDG COHESIVE 6X5 TAN STRL LF (GAUZE/BANDAGES/DRESSINGS) ×2 IMPLANT
BNDG GAUZE ELAST 4 BULKY (GAUZE/BANDAGES/DRESSINGS) ×2 IMPLANT
CANISTER SUCTION 2500CC (MISCELLANEOUS) ×2 IMPLANT
CLIP TI MEDIUM 6 (CLIP) IMPLANT
COVER SURGICAL LIGHT HANDLE (MISCELLANEOUS) ×2 IMPLANT
COVER TABLE BACK 60X90 (DRAPES) ×2 IMPLANT
DRAIN CHANNEL 19F RND (DRAIN) IMPLANT
DRAPE ORTHO SPLIT 77X108 STRL (DRAPES) ×4
DRAPE PROXIMA HALF (DRAPES) ×2 IMPLANT
DRAPE SURG ORHT 6 SPLT 77X108 (DRAPES) ×2 IMPLANT
DRSG ADAPTIC 3X8 NADH LF (GAUZE/BANDAGES/DRESSINGS) ×2 IMPLANT
DRSG PAD ABDOMINAL 8X10 ST (GAUZE/BANDAGES/DRESSINGS) ×1 IMPLANT
ELECT REM PT RETURN 9FT ADLT (ELECTROSURGICAL) ×2
ELECTRODE REM PT RTRN 9FT ADLT (ELECTROSURGICAL) ×1 IMPLANT
EVACUATOR SILICONE 100CC (DRAIN) IMPLANT
GAUZE SPONGE 4X4 12PLY STRL (GAUZE/BANDAGES/DRESSINGS) ×2 IMPLANT
GLOVE BIO SURGEON STRL SZ 6.5 (GLOVE) ×1 IMPLANT
GLOVE BIO SURGEON STRL SZ7.5 (GLOVE) ×2 IMPLANT
GLOVE BIOGEL PI IND STRL 6.5 (GLOVE) IMPLANT
GLOVE BIOGEL PI IND STRL 7.0 (GLOVE) IMPLANT
GLOVE BIOGEL PI INDICATOR 6.5 (GLOVE) ×1
GLOVE BIOGEL PI INDICATOR 7.0 (GLOVE) ×2
GLOVE ECLIPSE 6.5 STRL STRAW (GLOVE) ×1 IMPLANT
GOWN STRL REUS W/ TWL LRG LVL3 (GOWN DISPOSABLE) ×3 IMPLANT
GOWN STRL REUS W/TWL LRG LVL3 (GOWN DISPOSABLE) ×6
KIT BASIN OR (CUSTOM PROCEDURE TRAY) ×2 IMPLANT
KIT ROOM TURNOVER OR (KITS) ×2 IMPLANT
NS IRRIG 1000ML POUR BTL (IV SOLUTION) ×2 IMPLANT
PACK GENERAL/GYN (CUSTOM PROCEDURE TRAY) ×2 IMPLANT
PAD ARMBOARD 7.5X6 YLW CONV (MISCELLANEOUS) ×4 IMPLANT
SPONGE GAUZE 4X4 12PLY STER LF (GAUZE/BANDAGES/DRESSINGS) ×1 IMPLANT
STAPLER VISISTAT 35W (STAPLE) ×2 IMPLANT
STOCKINETTE IMPERVIOUS LG (DRAPES) ×2 IMPLANT
SUT ETHILON 3 0 PS 1 (SUTURE) IMPLANT
SUT SILK 2 0 SH (SUTURE) IMPLANT
SUT SILK 2 0 SH CR/8 (SUTURE) ×2 IMPLANT
SUT SILK 2 0 TIES 10X30 (SUTURE) ×2 IMPLANT
SUT VIC AB 2-0 CT1 18 (SUTURE) ×5 IMPLANT
SUT VIC AB 2-0 SH 18 (SUTURE) IMPLANT
SUT VIC AB 3-0 SH 27 (SUTURE) ×4
SUT VIC AB 3-0 SH 27X BRD (SUTURE) ×2 IMPLANT
UNDERPAD 30X30 INCONTINENT (UNDERPADS AND DIAPERS) ×2 IMPLANT
WATER STERILE IRR 1000ML POUR (IV SOLUTION) ×2 IMPLANT

## 2015-10-30 NOTE — Anesthesia Procedure Notes (Signed)
Procedure Name: Intubation Performed by: Judeth Cornfield T Pre-anesthesia Checklist: Patient identified, Emergency Drugs available, Suction available, Patient being monitored and Timeout performed Oxygen Delivery Method: Circle system utilized Preoxygenation: Pre-oxygenation with 100% oxygen Intubation Type: IV induction Ventilation: Mask ventilation without difficulty and Oral airway inserted - appropriate to patient size Laryngoscope Size: Mac and 3 Grade View: Grade II Tube type: Oral Number of attempts: 1 Airway Equipment and Method: Stylet Placement Confirmation: ETT inserted through vocal cords under direct vision,  positive ETCO2,  CO2 detector and breath sounds checked- equal and bilateral Secured at: 22 cm Tube secured with: Tape Dental Injury: Teeth and Oropharynx as per pre-operative assessment

## 2015-10-30 NOTE — Op Note (Signed)
[facility] OPERATIVE NOTE Procedure: Right above knee amputation  Surgeon(s): Elam Dutch, MD  ASSISTANT: Fredric Mare, PA-student  Anesthesia: General  Specimens: Right leg  PROCEDURE DETAIL: After obtaining informed consent, the patient was taken to the operating room. The patient was placed in supine position the operating room table. After induction of general anesthesia and endotracheal intubation the patient's Foley catheter was placed. Next patient's entire right lower extremity was prepped and draped in usual sterile fashion. A circumferential incision was made on the right leg just above the knee. The incision was carried down into the sucutaneous tissues down to level of the previously occluded fem pop bypas. This was ligated and divided between silk ties. Soft tissues were taken down as well as the muscle and fascia with cautery. The superficial femoral artery and vein were dissected free circumferentially clamped and divided. These were suture ligated proximally. Remainder of the soft tissues were taken down with cautery. The periosteum was raised on the femur approximately 5 cm above the skin edge. The femur was divided at this level. The leg was passed off the table as a specimen. Hemostasis was obtained. The wound was thoroughly irrigated with normal saline solution. The fascial edges were reapproximated using interrupted 2 0 Vicryl sutures. The subcutaneous tissues reapproximated using a running 3-0 Vicryl suture. The skin was closed staples. Patient tolerated procedure well and there were no complications. Instrument sponge and needle counts correct in the case. Patient was taken to recovery in stable condition.  Ruta Hinds, MD [facility] [place] Office: [phone] Pager: [phone]

## 2015-10-30 NOTE — Anesthesia Postprocedure Evaluation (Signed)
Anesthesia Post Note  Patient: [name]  Procedure(s) Performed: Procedure(s) (LRB): RIGHT ABOVE KNEE AMPUTATION (Right)  Patient location during evaluation: PACU Anesthesia Type: General Level of consciousness: awake and alert Pain management: pain level controlled Vital Signs Assessment: post-procedure vital signs reviewed and stable Respiratory status: spontaneous breathing, nonlabored ventilation, respiratory function stable and patient connected to nasal cannula oxygen Cardiovascular status: blood pressure returned to baseline and stable Postop Assessment: no signs of nausea or vomiting Anesthetic complications: no    Last Vitals:  Filed Vitals:   10/30/15 1520 10/30/15 1530  BP:  109/45  Pulse:  72  Temp:    Resp: 11 13    Last Pain:  Filed Vitals:   10/30/15 1552  PainSc: 0-No pain                 Plummer Matich,JAMES TERRILL

## 2015-10-30 NOTE — Anesthesia Preprocedure Evaluation (Addendum)
Anesthesia Evaluation  Patient identified by MRN, date of birth, ID band Patient awake    Reviewed: Allergy & Precautions, NPO status , Patient's Chart, lab work & pertinent test results  Airway Mallampati: I  TM Distance: >3 FB Neck ROM: Full    Dental  (+) Teeth Intact   Pulmonary COPD, Current Smoker,   coug ,heavy smell cigarettes Pulmonary exam normal breath sounds clear to auscultation+ rhonchi        Cardiovascular hypertension, Pt. on medications + Peripheral Vascular Disease and +CHF  Normal cardiovascular exam Rhythm:Regular Rate:Tachycardia   - Left ventricle: The cavity size was normal. Wall thickness was normal. Systolic function was normal. The estimated ejection fraction was in the range of 50% to 55%. Indeterminate diastolic function. - Regional wall motion abnormality: Hypokinesis of the mid anterior and basal-mid anteroseptal myocardium. - Aortic valve: Valve area (VTI): 3.18 cm^2. Valve area (Vmax): 2.16 cm^2. - Right ventricle: The cavity size was mildly to moderately dilated. - Right atrium: The atrium was mildly dilated. - Atrial septum: No defect or patent foramen ovale was identified. - Pulmonary arteries: Systolic pressure was mildly to moderately increased. PA peak pressure: 39 mm Hg (S). - Inferior vena cava: The vessel was dilated. The respirophasic diameter changes were blunted (< 50%), consistent with elevated central venous pressure. - Technically difficult study. Echocontrast was used to enhance visualization.    Neuro/Psych CVA    GI/Hepatic GERD  ,  Endo/Other  diabetes, Type 2, Insulin Dependent  Renal/GU      Musculoskeletal  (+) Arthritis ,   Abdominal   Peds  Hematology   Anesthesia Other Findings   Reproductive/Obstetrics                           Anesthesia Physical Anesthesia Plan  ASA: IV  Anesthesia Plan: General    Post-op Pain Management:    Induction: Intravenous  Airway Management Planned: Oral ETT  Additional Equipment:   Intra-op Plan:   Post-operative Plan: Extubation in OR  Informed Consent: I have reviewed the patients History and Physical, chart, labs and discussed the procedure including the risks, benefits and alternatives for the proposed anesthesia with the patient or authorized representative who has indicated his/her understanding and acceptance.   Dental advisory given  Plan Discussed with: CRNA and Surgeon  Anesthesia Plan Comments:         Anesthesia Quick Evaluation

## 2015-10-30 NOTE — Interval H&P Note (Signed)
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 After consideration of risks, benefits and other options for treatment, the patient has consented to  Procedure(s): AMPUTATION ABOVE KNEE (Right) as a surgical intervention .  The patient's history has been reviewed, patient examined, no change in status, stable for surgery.  I have reviewed the patient's chart and labs.  Questions were answered to the patient's satisfaction.     Ruta Hinds

## 2015-10-30 NOTE — Progress Notes (Signed)
Utilization review completed.  

## 2015-10-30 NOTE — Transfer of Care (Signed)
Immediate Anesthesia Transfer of Care Note  Patient: [name]  Procedure(s) Performed: Procedure(s): RIGHT ABOVE KNEE AMPUTATION (Right)  Patient Location: PACU  Anesthesia Type:General  Level of Consciousness: awake, patient cooperative and responds to stimulation  Airway & Oxygen Therapy: Patient Spontanous Breathing and Patient connected to nasal cannula oxygen  Post-op Assessment: Report given to RN and Post -op Vital signs reviewed and stable  Post vital signs: Reviewed and stable  Last Vitals:  Filed Vitals:   10/30/15 1125 10/30/15 1130  BP:    Pulse: 121 118  Temp:    Resp: 21 14    Complications: No apparent anesthesia complications

## 2015-10-30 NOTE — H&P (View-Only) (Signed)
[facility] [place] HISTORY AND PHYSICAL   History of Present Illness:  Patient is a 65 year old female who presents for follow-up after right femoral to below-knee popliteal bypass and thrombectomy of her tibial vessels for acute on chronic ischemia. This was 7 months ago. She also had a fasciotomy and returns today for follow-up evaluation of her fasciotomy wound .  She is on Aspirin for antiplatelet therapy. She states she was in the hospital for a COPD exacerbation recently. During the time of that hospital admission a pre-existing right leg fasciotomy wound which had completely healed has now reopened. She does not complain of pain in her foot. However, today on her noninvasive vascular exam she was noted to have minimal flow in the right foot and occlusion of her right femoropopliteal bypass. Her atherosclerotic risk factors remain diabetes, hypertension, smoking.  She has not smoked for several months. These are all currently stable and followed by the primary care physician.  The patient was minimally active prior to her operation. She remains minimally active now and has to be encouraged to walk more. She is currently ambulating.  She had one vessel runoff via the posterior tibial artery at the conclusion of the case.      Past Medical History     Diagnosis    Date     .    Hypertension         .    Diabetes mellitus without complication         .    COPD (chronic obstructive pulmonary disease)         .    Stroke         .    Chronic back pain             Past Surgical History     Procedure    Laterality    Date     .    Back fusion             .    Cholecystectomy             .    Foot surgery    Bilateral         .    Cardiac catheterization             .    Peripheral vascular catheterization    N/A    04/10/2015             Procedure: Abdominal Aortogram w/Lower Extremity;  Surgeon: Conrad Benavides, MD;  Location: [facility];  Service: Cardiovascular;   Laterality: N/A;     .    Thrombectomy femoral artery    Right    04/10/2015             Procedure: THROMBECTOMY RIGHT TIBIAL  ARTERY WITH VEIN PATCH ANGIOPLASTY;  Surgeon: Elam Dutch, MD;  Location: [place];  Service: Vascular;  Laterality: Right;     .    Fasciotomy    Right    04/10/2015             Procedure: FOUR COMPARTMENT FASCIOTOMY;  Surgeon: Elam Dutch, MD;  Location: [facility] OR;  Service: Vascular;  Laterality: Right;     .    Femoral-popliteal bypass graft    Right    04/10/2015             Procedure: RIGHT  FEMORAL-BELOW THE KNEE POPLITEAL ARTERY BYPASS GRAFT  USING 6 MM X 80 CM PROPATEN GRAFT;  Surgeon: Elam Dutch, MD;  Location[facility] OR;  Service: Vascular;  Laterality: Right;     .    Intraoperative arteriogram    Right    04/10/2015             Procedure: INTRA OPERATIVE ARTERIOGRAM;  Surgeon: Elam Dutch, MD;  Location: [facility] OR;  Service: Vascular;  Laterality: Right;       Physical Examination Filed Vitals:   10/19/15 1049 10/19/15 1058  BP: 145/88 141/84  Pulse: 119 118  Temp: 98 F (36.7 C)   TempSrc: Oral   Resp: 18   Height: 5\' 6"  (1.676 m)   Weight: 179 lb (81.194 kg)   SpO2: 99%       General:  Alert and oriented, no acute distress Neurologic: Right foot drop, no skin ulcers on either foot, right leg is cool from the knee to the foot. Right foot is dusky in appearance. There is a 7 x 3 cm open fasciotomy wound on the lateral aspect of the right leg with minimal granulation tissue and some necrotic exudate. Extremities:  Right leg edematous extending from the mid thigh down to the foot right groin incision and the incision well-healed  Skin: no ulcer or rash the exception listed above  Data: Patient had bilateral ABIs performed today which were minimal flow noted on the right 0.96 on the left with biphasic waveforms. Graft duplex showed no increased velocities throughout the bypass graft previously but today this is occluded  ASSESSMENT: Wound breakdown  of right leg fasciotomy with occlusion of right femoropopliteal bypass graft. Patient is currently asymptomatic. Difficult to know how long the graft has been occluded. At this point no she is certainly at risk of limb loss that she has minimal flow to the right foot.Marland Kitchen   PLAN:  aortogram bilateral lower extremity runoff tomorrow to see if she is a potential candidate for redo bypass. Patient states she may not have transportation tomorrow. I emphasized to her that she is at high risk of limb loss. She will try to get the procedure scheduled as soon as possible. Risks benefits possible palpitations and procedure details were discussed with the patient today including but not limited to bleeding infection and high risk of limb loss if she does not have revascularization  Ruta Hinds, MD [facility] [place]: [phone] Pager: [phone]

## 2015-10-31 ENCOUNTER — Encounter (HOSPITAL_COMMUNITY): Payer: Self-pay | Admitting: Vascular Surgery

## 2015-10-31 DIAGNOSIS — G8929 Other chronic pain: Secondary | ICD-10-CM

## 2015-10-31 DIAGNOSIS — I10 Essential (primary) hypertension: Secondary | ICD-10-CM

## 2015-10-31 DIAGNOSIS — M545 Low back pain, unspecified: Secondary | ICD-10-CM | POA: Insufficient documentation

## 2015-10-31 DIAGNOSIS — Z89619 Acquired absence of unspecified leg above knee: Secondary | ICD-10-CM | POA: Insufficient documentation

## 2015-10-31 DIAGNOSIS — G546 Phantom limb syndrome with pain: Secondary | ICD-10-CM | POA: Insufficient documentation

## 2015-10-31 DIAGNOSIS — D72829 Elevated white blood cell count, unspecified: Secondary | ICD-10-CM

## 2015-10-31 DIAGNOSIS — M79609 Pain in unspecified limb: Secondary | ICD-10-CM | POA: Insufficient documentation

## 2015-10-31 DIAGNOSIS — R0689 Other abnormalities of breathing: Secondary | ICD-10-CM

## 2015-10-31 DIAGNOSIS — I739 Peripheral vascular disease, unspecified: Secondary | ICD-10-CM

## 2015-10-31 DIAGNOSIS — D62 Acute posthemorrhagic anemia: Secondary | ICD-10-CM

## 2015-10-31 DIAGNOSIS — R000 Tachycardia, unspecified: Secondary | ICD-10-CM

## 2015-10-31 DIAGNOSIS — G8918 Other acute postprocedural pain: Secondary | ICD-10-CM | POA: Insufficient documentation

## 2015-10-31 DIAGNOSIS — Z89611 Acquired absence of right leg above knee: Secondary | ICD-10-CM

## 2015-10-31 DIAGNOSIS — E1165 Type 2 diabetes mellitus with hyperglycemia: Secondary | ICD-10-CM

## 2015-10-31 DIAGNOSIS — J449 Chronic obstructive pulmonary disease, unspecified: Secondary | ICD-10-CM | POA: Insufficient documentation

## 2015-10-31 DIAGNOSIS — E1142 Type 2 diabetes mellitus with diabetic polyneuropathy: Secondary | ICD-10-CM

## 2015-10-31 DIAGNOSIS — R269 Unspecified abnormalities of gait and mobility: Secondary | ICD-10-CM

## 2015-10-31 LAB — CBC
HEMATOCRIT: 31.2 % — AB (ref 36.0–46.0)
HEMOGLOBIN: 9.6 g/dL — AB (ref 12.0–15.0)
MCH: 26.3 pg (ref 26.0–34.0)
MCHC: 30.8 g/dL (ref 30.0–36.0)
MCV: 85.5 fL (ref 78.0–100.0)
PLATELETS: 398 10*3/uL (ref 150–400)
RBC: 3.65 MIL/uL — AB (ref 3.87–5.11)
RDW: 17.8 % — ABNORMAL HIGH (ref 11.5–15.5)
WBC: 16.9 10*3/uL — AB (ref 4.0–10.5)

## 2015-10-31 LAB — BASIC METABOLIC PANEL
ANION GAP: 12 (ref 5–15)
BUN: 14 mg/dL (ref 6–20)
CHLORIDE: 96 mmol/L — AB (ref 101–111)
CO2: 28 mmol/L (ref 22–32)
Calcium: 9.1 mg/dL (ref 8.9–10.3)
Creatinine, Ser: 0.85 mg/dL (ref 0.44–1.00)
GFR calc non Af Amer: >60 mL/min (ref >60)
Glucose, Bld: 271 mg/dL — ABNORMAL HIGH (ref 65–99)
POTASSIUM: 4.6 mmol/L (ref 3.5–5.1)
SODIUM: 136 mmol/L (ref 135–145)

## 2015-10-31 LAB — GLUCOSE, CAPILLARY
GLUCOSE-CAPILLARY: 243 mg/dL — AB (ref 65–99)
GLUCOSE-CAPILLARY: 210 mg/dL — AB (ref 65–99)
GLUCOSE-CAPILLARY: 101 mg/dL — AB (ref 65–99)
Glucose-Capillary: 158 mg/dL — ABNORMAL HIGH (ref 65–99)

## 2015-10-31 MED ORDER — CETYLPYRIDINIUM CHLORIDE 0.05 % MT LIQD
7.0000 mL | Freq: Two times a day (BID) | OROMUCOSAL | Status: DC
  Administered 2015-10-31 – 2015-11-03 (×3): 7 mL via OROMUCOSAL

## 2015-10-31 NOTE — Progress Notes (Signed)
Inpatient Diabetes Program Recommendations  AACE/ADA: New Consensus Statement on Inpatient Glycemic Control (2015)  Target Ranges:  Prepandial:   less than 140 mg/dL      Peak postprandial:   less than 180 mg/dL (1-2 hours)      Critically ill patients:  140 - 180 mg/dL   Review of Glycemic Control:  Results for SEFORA, MADA (MRN [id number]) as of 10/31/2015 09:26  Ref. Range 10/30/2015 09:43 10/30/2015 12:13 10/30/2015 16:04 10/30/2015 22:09 10/31/2015 06:11  Glucose-Capillary Latest Ref Range: 65-99 mg/dL 154 (H) 157 (H) 165 (H) 247 (H) 243 (H)   Results for DAMARA, AGUILLON (MRN [id number]) as of 10/31/2015 09:26  Ref. Range 10/27/2015 14:52  Hemoglobin A1C Latest Ref Range: 4.8-5.6 % 8.6 (H)   Diabetes history:  Type 2 diabetes Outpatient Diabetes medications: Lantus 34 units q HS, Metformin 500 mg bid Current orders for Inpatient glycemic control:  Novolog moderate tid with meals and HS, Lantus 34 units q HS, Metformin 500 mg bid   Inpatient Diabetes Program Recommendations:    Note that CBG's increased after surgery.  Patient did received Decadron 10 mg on 10/30/15 which likely increased CBG's after surgery.  A1C indicates that average CBG approximately 198 mg/dL.  May benefit from the addition of Novolog meal coverage 5 units tid with meals to cover post-prandial CBG's while in the hospital.  Will continue to monitor.   Thanks, Adah Perl, RN, BC-ADM Inpatient Diabetes Coordinator Pager [phone] (8a-5p)

## 2015-10-31 NOTE — NC FL2 (Signed)
[place] LEVEL OF CARE SCREENING TOOL     IDENTIFICATION  Patient Name: [name] Birthdate: 05/08/1951 Sex: female Admission Date (Current Location): 10/30/2015  [facility] and [place] Number:  [employer] and Address:  [facility] [place], [place] [address], [place], [place] [address]      Provider Number: [id number]  Attending Physician Name and Address:  Elam Dutch, MD  Relative Name and Phone Number:  [name], spouse, [phone]    Current Level of Care: Hospital Recommended Level of Care: [place] Prior Approval Number:    Date Approved/Denied:   PASRR Number: [id number]  Discharge Plan: SNF    Current Diagnoses: Patient Active Problem List   Diagnosis Date Noted  . S/P AKA (above knee amputation) unilateral ([place])   . Abnormality of gait   . Acute blood loss anemia   . Postoperative pain of extremity   . Phantom limb pain ([place])   . Essential hypertension   . Chronic obstructive pulmonary disease ([place])   . Chronic low back pain   . Poorly controlled type 2 diabetes mellitus with peripheral neuropathy ([place])   . PVD (peripheral vascular disease) ([place])   . Respiratory rate decreased   . Leukocytosis   . Atherosclerosis of right leg ([place]) 10/30/2015  . Pulmonary edema 08/18/2015  . CHF exacerbation ([place]) 08/18/2015  . SOB (shortness of breath) 08/18/2015  . Open wound of knee, leg (except thigh), and ankle 08/18/2015  . Abnormal echocardiogram   . SVT (supraventricular tachycardia) ([place])   . Lower limb ischemia 04/09/2015  . Critical lower limb ischemia 04/09/2015  . AKI (acute kidney injury) ([place]) 04/09/2015  . Hyperkalemia 04/09/2015  . Pancreatitis 12/26/2013  . Tachycardia 12/26/2013  . Dehydration 12/26/2013  . Vomiting 12/26/2013  . COPD (chronic obstructive pulmonary disease) ([place]) 12/26/2013  . Essential hypertension, benign 12/26/2013  . Diabetes ([place]) 12/26/2013  . Hypomagnesemia  12/26/2013    Orientation RESPIRATION BLADDER Height & Weight     Self, Time, Situation, Place  O2 (5L/min) Continent Weight: 168 lb (76.204 kg) Height:  5\' 6"  (167.6 cm)  BEHAVIORAL SYMPTOMS/MOOD NEUROLOGICAL BOWEL NUTRITION STATUS      Continent  (Please see DC summary)  AMBULATORY STATUS COMMUNICATION OF NEEDS Skin   Extensive Assist Verbally Surgical wounds (Closed incision on leg)                       Personal Care Assistance Level of Assistance  Bathing, Feeding, Dressing Bathing Assistance: Maximum assistance Feeding assistance: Independent Dressing Assistance: Limited assistance     Functional Limitations Info             SPECIAL CARE FACTORS FREQUENCY  PT (By licensed PT)     PT Frequency: Not yet assessed for AKA;              Contractures      Additional Factors Info  Code Status, Allergies, Insulin Sliding Scale Code Status Info: Full (Prior) Allergies Info: Penicillins, Ciprofloxacin   Insulin Sliding Scale Info: insulin aspart (novoLOG) injection 0-15 Units;insulin glargine (LANTUS) injection 34 Units;       Current Medications (10/31/2015):  This is the current hospital active medication list Current Facility-Administered Medications  Medication Dose Route Frequency Provider Last Rate Last Dose  . 0.9 %  sodium chloride infusion   Intravenous Continuous Alvia Grove, PA-C 75 mL/hr at 10/31/15 0405 1,000 mL at 10/31/15 0405  . acetaminophen (TYLENOL)  tablet 325-650 mg  325-650 mg Oral Q4H PRN Alvia Grove, PA-C       Or  . acetaminophen (TYLENOL) suppository 325-650 mg  325-650 mg Rectal Q4H PRN Alvia Grove, PA-C      . albuterol (PROVENTIL) (2.5 MG/3ML) 0.083% nebulizer solution 3 mL  3 mL Inhalation Q6H PRN Alvia Grove, PA-C      . ALPRAZolam Duanne Moron) tablet 0.25 mg  0.25 mg Oral QID PRN Alvia Grove, PA-C      . alum & mag hydroxide-simeth (MAALOX/MYLANTA) 200-200-20 MG/5ML suspension 15-30 mL  15-30 mL Oral Q2H PRN  Alvia Grove, PA-C      . antiseptic oral rinse (CPC / CETYLPYRIDINIUM CHLORIDE 0.05%) solution 7 mL  7 mL Mouth Rinse BID Elam Dutch, MD   7 mL at 10/31/15 0945  . aspirin tablet 325 mg  325 mg Oral Daily Alvia Grove, PA-C   325 mg at 10/31/15 0944  . atorvastatin (LIPITOR) tablet 20 mg  20 mg Oral q1800 Alvia Grove, PA-C      . bisacodyl (DULCOLAX) suppository 10 mg  10 mg Rectal Daily PRN Alvia Grove, PA-C      . citalopram (CELEXA) tablet 20 mg  20 mg Oral Daily Alvia Grove, PA-C   20 mg at 10/31/15 0944  . dextromethorphan-guaiFENesin (MUCINEX DM) 30-600 MG per 12 hr tablet 1 tablet  1 tablet Oral BID PRN Alvia Grove, PA-C      . diphenhydrAMINE (BENADRYL) injection 12.5 mg  12.5 mg Intravenous Q6H PRN Alvia Grove, PA-C       Or  . diphenhydrAMINE (BENADRYL) 12.5 MG/5ML elixir 12.5 mg  12.5 mg Oral Q6H PRN Alvia Grove, PA-C      . docusate sodium (COLACE) capsule 100 mg  100 mg Oral Daily Alvia Grove, PA-C   100 mg at 10/31/15 0944  . enoxaparin (LOVENOX) injection 40 mg  40 mg Subcutaneous Q24H Alvia Grove, PA-C   40 mg at 10/31/15 0815  . gabapentin (NEURONTIN) capsule 300 mg  300 mg Oral TID Alvia Grove, PA-C   300 mg at 10/31/15 0944  . hydrALAZINE (APRESOLINE) injection 5 mg  5 mg Intravenous Q20 Min PRN Alvia Grove, PA-C      . insulin aspart (novoLOG) injection 0-15 Units  0-15 Units Subcutaneous TID WC Alvia Grove, PA-C   5 Units at 10/31/15 [id number]  . insulin glargine (LANTUS) injection 34 Units  34 Units Subcutaneous QHS Alvia Grove, PA-C   34 Units at 10/30/15 2226  . ipratropium-albuterol (DUONEB) 0.5-2.5 (3) MG/3ML nebulizer solution 3 mL  3 mL Nebulization 3 times per day Alvia Grove, PA-C   3 mL at 10/31/15 [id number]  . labetalol (NORMODYNE,TRANDATE) injection 10 mg  10 mg Intravenous Q10 min PRN Alvia Grove, PA-C      . lisinopril (PRINIVIL,ZESTRIL) tablet 20 mg  20 mg Oral Daily Alvia Grove, PA-C    20 mg at 10/31/15 0944  . magnesium hydroxide (MILK OF MAGNESIA) suspension 30 mL  30 mL Oral Daily PRN Alvia Grove, PA-C      . magnesium sulfate IVPB 2 g 50 mL  2 g Intravenous Daily PRN Alvia Grove, PA-C      . metFORMIN (GLUCOPHAGE) tablet 500 mg  500 mg Oral BID WC Alvia Grove, PA-C   500 mg at 10/31/15 0630  . metoprolol (LOPRESSOR) injection 2-5 mg  2-5 mg Intravenous Q2H PRN Alvia Grove, PA-C      . metoprolol tartrate (LOPRESSOR) tablet 25 mg  25 mg Oral BID Alvia Grove, PA-C   25 mg at 10/31/15 0944  . morphine (MORPHINE) 2 mg/mL PCA injection   Intravenous 6 times per day Alvia Grove, PA-C   1 mg at 10/31/15 0800  . naloxone [facility]) injection 0.4 mg  0.4 mg Intravenous PRN Alvia Grove, PA-C       And  . sodium chloride flush (NS) 0.9 % injection 9 mL  9 mL Intravenous PRN Alvia Grove, PA-C      . ondansetron (ZOFRAN) injection 4 mg  4 mg Intravenous Q6H PRN Alvia Grove, PA-C      . pantoprazole (PROTONIX) EC tablet 40 mg  40 mg Oral Q supper Alvia Grove, PA-C   40 mg at 10/30/15 2144  . phenol (CHLORASEPTIC) mouth spray 1 spray  1 spray Mouth/Throat PRN Alvia Grove, PA-C      . potassium chloride SA (K-DUR,KLOR-CON) CR tablet 20-40 mEq  20-40 mEq Oral Daily PRN Alvia Grove, PA-C      . vancomycin (VANCOCIN) IVPB 1000 mg/200 mL premix  1,000 mg Intravenous Q12H Alvia Grove, PA-C   1,000 mg at 10/31/15 1118  . varenicline (CHANTIX) tablet 1 mg  1 mg Oral BID WC Alvia Grove, PA-C   1 mg at 10/31/15 0730     Discharge Medications: Please see discharge summary for a list of discharge medications.  Relevant Imaging Results:  Relevant Lab Results:   Additional Information SSN: [place], [place]

## 2015-10-31 NOTE — Consult Note (Signed)
Physical Medicine and Rehabilitation Consult Reason for Consult: Right AKA Referring Physician: Dr. Oneida Alar   HPI: [name] is a 65 y.o. right handed female with history of hypertension, COPD with remote tobacco abuse, chronic low back pain, diabetes mellitus with peripheral neuropathy, PVD status post recent right femoral to below-knee popliteal bypass graft and thrombectomy with fasciotomy. Patient lives with spouse independent with walker or cane prior to admission. Spouse has dementia and is only able to provide supervision on discharge. One level home with 2 steps to entry. Presented 10/30/2015 after recent office appointment with vascular surgery for monitoring of fasciotomy wound showed poor healing. A noninvasive vascular exam showed minimal flow in the right foot and occlusion of her right femoropopliteal bypass. Patient with progressive ischemic changes of the right foot. 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 Oneida Alar. Hospital course pain management. Acute blood loss anemia 10.0 and monitored. Subcutaneous Lovenox for DVT prophylaxis. Physical and occupational therapy evaluations pending. M.D. has requested physical medicine rehabilitation consult.   Review of Systems  Constitutional: Negative for fever and chills.  HENT: Negative for hearing loss.   Eyes: Negative for blurred vision and double vision.  Respiratory: Positive for cough.        Shortness of breath on exertion  Cardiovascular: Positive for leg swelling. Negative for chest pain and palpitations.  Gastrointestinal: Positive for constipation. Negative for nausea and vomiting.       GERD  Genitourinary: Negative for dysuria and hematuria.  Musculoskeletal: Positive for myalgias and back pain.  Skin: Negative for rash.  Neurological: Positive for weakness. Negative for seizures, loss of consciousness and headaches.  Psychiatric/Behavioral: Positive for depression.   Anxiety  All other systems reviewed and are negative.  Past Medical History  Diagnosis Date  . Hypertension   . COPD (chronic obstructive pulmonary disease) ([place])   . Stroke ([place])   . Chronic lower back pain   . Shingles Dec. 2016    Scalp  . Peripheral vascular disease ([place])   . GERD (gastroesophageal reflux disease)   . Hyperlipidemia   . Anxiety   . Type II diabetes mellitus ([place])   . Arthritis     "back" (10/30/2015)   Past Surgical History  Procedure Laterality Date  . Posterior lumbar fusion    . Foot surgery Bilateral     "from standing on concrete all day"  . Peripheral vascular catheterization N/A 04/10/2015    Procedure: Abdominal Aortogram w/Lower Extremity;  Surgeon: Conrad [place], MD;  Location: [facility];  Service: Cardiovascular;  Laterality: N/A;  . Thrombectomy femoral artery Right 04/10/2015    Procedure: THROMBECTOMY RIGHT TIBIAL  ARTERY WITH VEIN PATCH ANGIOPLASTY;  Surgeon: Elam Dutch, MD;  Location: [facility];  Service: Vascular;  Laterality: Right;  . Fasciotomy Right 04/10/2015    Procedure: FOUR COMPARTMENT FASCIOTOMY;  Surgeon: Elam Dutch, MD;  Location: [facility];  Service: Vascular;  Laterality: Right;  . Femoral-popliteal bypass graft Right 04/10/2015    Procedure: RIGHT  FEMORAL-BELOW THE KNEE POPLITEAL ARTERY BYPASS GRAFT USING 6 MM X 80 CM PROPATEN GRAFT;  Surgeon: Elam Dutch, MD;  Location: [facility];  Service: Vascular;  Laterality: Right;  . Intraoperative arteriogram Right 04/10/2015    Procedure: INTRA OPERATIVE ARTERIOGRAM;  Surgeon: Elam Dutch, MD;  Location: [facility];  Service: Vascular;  Laterality: Right;  . Carotid endarterectomy Right 2010    Dr Kellie Simmering   .  Peripheral vascular catheterization N/A 10/20/2015    Procedure: Abdominal Aortogram;  Surgeon: Elam Dutch, MD;  Location: [facility];  Service: Cardiovascular;  Laterality: N/A;  . Laparoscopic cholecystectomy    . Back surgery    . Carotid endarterectomy  Left 03/2008    Archie Endo 01/15/2011  . Shoulder open rotator cuff repair Right 02/2006    Archie Endo 01/29/2011  . Cardiac catheterization    . Leg amputation through knee Right 10/30/2015   Family History  Problem Relation Age of Onset  . Cancer Father   . Heart attack Sister   . Stroke Sister   . Hypertension Sister   . Hypertension Brother    Social History:  reports that she has been smoking Cigarettes.  She has a 5.88 pack-year smoking history. She has never used smokeless tobacco. She reports that she does not drink alcohol or use illicit drugs. Allergies:  Allergies  Allergen Reactions  . Penicillins Other (See Comments)    Has patient had a PCN reaction causing immediate rash, facial/tongue/throat swelling, SOB or lightheadedness with hypotension: unknown Has patient had a PCN reaction causing severe rash involving mucus membranes or skin necrosis: unknown Has patient had a PCN reaction that required hospitalization unknown Has patient had a PCN reaction occurring within the last 10 years: unknown If all of the above answers are "NO", then may proceed with Cephalosporin use.  . Ciprofloxacin Hives   Medications Prior to Admission  Medication Sig Dispense Refill  . albuterol (PROVENTIL HFA;VENTOLIN HFA) 108 (90 Base) MCG/ACT inhaler Inhale 2 puffs into the lungs every 6 (six) hours as needed for wheezing or shortness of breath.    . ALPRAZolam (XANAX) 0.25 MG tablet Take 0.25 mg by mouth 4 (four) times daily as needed for anxiety.     Marland Kitchen aspirin 325 MG tablet Take 1 tablet (325 mg total) by mouth daily. 30 tablet 0  . atorvastatin (LIPITOR) 20 MG tablet Take 1 tablet (20 mg total) by mouth daily at 6 PM. 30 tablet 0  . dextromethorphan-guaiFENesin (MUCINEX DM) 30-600 MG 12hr tablet Take 1 tablet by mouth 2 (two) times daily as needed for cough.     . gabapentin (NEURONTIN) 300 MG capsule Take 300 mg by mouth 3 (three) times daily.    . Insulin Glargine (LANTUS SOLOSTAR) 100 UNIT/ML  Solostar Pen Inject 34 Units into the skin at bedtime.    Marland Kitchen ipratropium-albuterol (DUONEB) 0.5-2.5 (3) MG/3ML SOLN Take 3 mLs by nebulization every 8 (eight) hours.     Marland Kitchen lisinopril (PRINIVIL,ZESTRIL) 20 MG tablet Take 20 mg by mouth daily.    . metFORMIN (GLUCOPHAGE) 500 MG tablet Take 500 mg by mouth 2 (two) times daily with a meal.    . metoprolol tartrate (LOPRESSOR) 25 MG tablet Take 25 mg by mouth 2 (two) times daily.     . naproxen (NAPROSYN) 500 MG tablet Take 500 mg by mouth 2 (two) times daily as needed for mild pain or moderate pain.     Marland Kitchen oxyCODONE-acetaminophen (PERCOCET) 10-325 MG tablet Take 1 tablet by mouth every 8 (eight) hours.     . pantoprazole (PROTONIX) 40 MG tablet Take 40 mg by mouth daily.    . varenicline (CHANTIX) 0.5 MG tablet Take 2 tablets (1 mg total) by mouth 2 (two) times daily. 60 tablet 3  . citalopram (CELEXA) 20 MG tablet Take 20 mg by mouth daily.      Home: Home Living Family/patient expects to be discharged to:: Unsure  Living Arrangements: Spouse/significant other, Children  Functional History:   Functional Status:  Mobility:          ADL:    Cognition: Cognition Orientation Level: Oriented X4    Blood pressure 112/65, pulse 109, temperature 97.5 F (36.4 C), temperature source Oral, resp. rate 10, height 5\' 6"  (1.676 m), weight 76.204 kg (168 lb), SpO2 98 %. Physical Exam  Vitals reviewed. Constitutional: She is oriented to person, place, and time. She appears well-developed and well-nourished.  HENT:  Head: Normocephalic and atraumatic.  Eyes: Conjunctivae and EOM are normal.  Neck: Normal range of motion. Neck supple. No thyromegaly present.  Cardiovascular: Normal rate, regular rhythm and intact distal pulses.   Respiratory: Effort normal and breath sounds normal.  GI: Soft. Bowel sounds are normal. She exhibits no distension.  Musculoskeletal: She exhibits edema and tenderness.  Right AKA  Neurological: She is alert and  oriented to person, place, and time.  Sensation intact to light touch throughout Motor: 5/5 B/L UE, LLE  Skin: Skin is warm and dry.  Amputation site is dressed appropriately tender  Psychiatric: She has a normal mood and affect. Her behavior is normal. Thought content normal.    Results for orders placed or performed during the hospital encounter of 10/30/15 (from the past 24 hour(s))  Glucose, capillary     Status: Abnormal   Collection Time: 10/30/15  9:43 AM  Result Value Ref Range   Glucose-Capillary 154 (H) 65 - 99 mg/dL   Comment 1 Notify RN    Comment 2 Document in Chart   Type and screen [place]     Status: None   Collection Time: 10/30/15 10:10 AM  Result Value Ref Range   ABO/RH(D) O POS    Antibody Screen NEG    Sample Expiration 11/02/2015   Glucose, capillary     Status: Abnormal   Collection Time: 10/30/15 12:13 PM  Result Value Ref Range   Glucose-Capillary 157 (H) 65 - 99 mg/dL   Comment 1 Notify RN    Comment 2 Document in Chart   Glucose, capillary     Status: Abnormal   Collection Time: 10/30/15  4:04 PM  Result Value Ref Range   Glucose-Capillary 165 (H) 65 - 99 mg/dL   Comment 1 Notify RN   CBC     Status: Abnormal   Collection Time: 10/30/15  8:24 PM  Result Value Ref Range   WBC 13.8 (H) 4.0 - 10.5 K/uL   RBC 3.79 (L) 3.87 - 5.11 MIL/uL   Hemoglobin 10.0 (L) 12.0 - 15.0 g/dL   HCT 32.3 (L) 36.0 - 46.0 %   MCV 85.2 78.0 - 100.0 fL   MCH 26.4 26.0 - 34.0 pg   MCHC 31.0 30.0 - 36.0 g/dL   RDW 17.8 (H) 11.5 - 15.5 %   Platelets 424 (H) 150 - 400 K/uL  Creatinine, serum     Status: None   Collection Time: 10/30/15  8:24 PM  Result Value Ref Range   Creatinine, Ser 0.90 0.44 - 1.00 mg/dL   GFR calc non Af Amer >60 >60 mL/min   GFR calc Af Amer >60 >60 mL/min  Glucose, capillary     Status: Abnormal   Collection Time: 10/30/15 10:09 PM  Result Value Ref Range   Glucose-Capillary 247 (H) 65 - 99 mg/dL   No results  found.  Assessment/Plan: Diagnosis: Right AKA Labs and images independently reviewed.  Records reviewed and summated above. Clean amputation  daily with soap and water Monitor incision site for signs of infection or impending skin breakdown. Staples to remain in place for 3-4 weeks Stump shrinker, for edema control  Scar mobilization massaging to prevent soft tissue adherence Stump protector during therapies Prevent flexion contractures by implementing the following:   Encourage prone lying for 20-30 mins per day BID to avoid hip flexion contractures if medically appropriate;  Avoid pillow under knees when patient is lying in bed in order to prevent hip flexion contractures;  Avoid prolonged sitting Post surgical pain control with oral medication Phantom limb pain control with physical modalities including desensitization techniques (gentle self massage to the residual stump,hot packs if sensation iintact, [place]) and mirror therapy, TENS. If ineffective, consider pharmacological treatment for neuropathic pain (e.g gabapentin, pregabalin, amytriptalyine, duloxetine).  Avoid injury to contralateral side  1. Does the need for close, 24 hr/day medical supervision in concert with the patient's rehab needs make it unreasonable for this patient to be served in a less intensive setting? Yes 2. Co-Morbidities requiring supervision/potential complications: ABLA (transfuse if necessary to ensure appropriate perfusion for increased activity tolerance), post-op pain and phantom limb pain (Biofeedback training with therapies to help reduce reliance on opiate pain medications, monitor pain control during therapies, and sedation at rest and titrate to maximum efficacy to ensure participation and gains in therapies), HTN (monitor and provide prns in accordance with increased physical exertion and pain), COPD (monitor O2 Sats and RR in accordance with increased activity), chronic low back pain (see pain), diabetes  mellitus with peripheral neuropathy  (Monitor in accordance with exercise and adjust meds as necessary), PVD (cont meds), Tachycardia (monitor in accordance with pain and increasing activity), Decreased RR (consider weaning of narcotics as soon as possible to avoid respiratory complications), leukocytosis (cont to monitor for signs and symptoms of infection, further workup if indicated) 3. Due to bowel management, safety, skin/wound care, disease management, pain management and patient education, does the patient require 24 hr/day rehab nursing? Yes 4. Does the patient require coordinated care of a physician, rehab nurse, PT (1-2 hrs/day, 5 days/week) and OT (1-2 hrs/day, 5 days/week) to address physical and functional deficits in the context of the above medical diagnosis(es)? Yes Addressing deficits in the following areas: balance, endurance, locomotion, strength, transferring, bowel/bladder control, bathing, dressing, toileting and psychosocial support 5. Can the patient actively participate in an intensive therapy program of at least 3 hrs of therapy per day at least 5 days per week? Likely 6. The potential for patient to make measurable gains while on inpatient rehab is excellent 7. Anticipated functional outcomes upon discharge from inpatient rehab are TBD  with PT, TBD with OT, n/a with SLP. 8. Estimated rehab length of stay to reach the above functional goals is: TBD 9. Does the patient have adequate social supports and living environment to accommodate these discharge functional goals? Potentially 10. Anticipated D/C setting: Home 11. Anticipated post D/C treatments: HH therapy and Home excercise program 12. Overall Rehab/Functional Prognosis: excellent  RECOMMENDATIONS: This patient's condition is appropriate for continued rehabilitative care in the following setting: Likely CIR, however will need to await formal therapy consults and stabilization of leukocytosis and improvement in  pain. Patient has agreed to participate in recommended program. Yes Note that insurance prior authorization may be required for reimbursement for recommended care.  Comment: Rehab Admissions Coordinator to follow up.  Delice Lesch, MD 10/31/2015

## 2015-10-31 NOTE — Progress Notes (Signed)
Occupational Therapy Evaluation Patient Details Name: [name] MRN: [id number] DOB: [birth year] Today's Date: 10/31/2015    History of Present Illness 65 y.o. right handed female with history of hypertension, COPD with remote tobacco abuse, chronic low back pain, diabetes mellitus with peripheral neuropathy, PVD status post recent right femoral to below-knee popliteal bypass graft and thrombectomy with fasciotomy. Underwent right AKA 10/30/2015 due to progressive ischemic changes.   Clinical Impression   PTA, pt lived at home with husband and was independent with ADL and mobility prior to recent fem pop bypass surgery. Pt is an excellent CIR candidate and feel pt could reach S level goals. Very motivated to return to higher level of independence. Supportive family and friends. Will follow acutely to address established goals.     Follow Up Recommendations  CIR;Supervision/Assistance - 24 hour    Equipment Recommendations  3 in 1 bedside comode;Tub/shower bench;Wheelchair (measurements OT);Wheelchair cushion (measurements OT)    Recommendations for Other Services Rehab consult     Precautions / Restrictions Precautions Precautions: Fall      Mobility Bed Mobility Overal bed mobility: Needs Assistance Bed Mobility: Supine to Sit     Supine to sit: Min assist;HOB elevated        Transfers Overall transfer level: Needs assistance Equipment used: Rolling walker (2 wheeled);2 person hand held assist Transfers: Sit to/from [facility] Sit to Stand: Min assist;+2 physical assistance Stand pivot transfers: Mod assist;+2 physical assistance       General transfer comment: Anxious but good participation during transfer    Balance Overall balance assessment: Needs assistance Sitting-balance support: Bilateral upper extremity supported Sitting balance-Leahy Scale: Fair       Standing balance-Leahy Scale: Poor                               ADL Overall ADL's : Needs assistance/impaired     Grooming: Set up;Sitting   Upper Body Bathing: Set up;Sitting Upper Body Bathing Details (indicate cue type and reason): supported Lower Body Bathing: Maximal assistance;Sit to/from stand   Upper Body Dressing : Set up;Supervision/safety;Sitting   Lower Body Dressing: Maximal assistance;Sit to/from stand   Toilet Transfer: Moderate assistance;+2 for physical assistance;BSC;Stand-pivot   Toileting- Clothing Manipulation and Hygiene: Moderate assistance Toileting - Clothing Manipulation Details (indicate cue type and reason): Pt able to wipe after urinating, but stated she was scared of falling and of causing pain.     Functional mobility during ADLs: Moderate assistance;+2 for physical assistance;Rolling walker;Cueing for safety;Cueing for sequencing General ADL Comments: Max cuing for position of self in RW during functional transfer. Better performance with squat pivot transfer.      Vision     Perception     Praxis      Pertinent Vitals/Pain Pain Assessment: 0-10 Pain Score: 8  Pain Location: RLE Pain Descriptors / Indicators: Aching Pain Intervention(s): Limited activity within patient's tolerance;PCA encouraged     Hand Dominance Right   Extremity/Trunk Assessment Upper Extremity Assessment Upper Extremity Assessment: Generalized weakness   Lower Extremity Assessment Lower Extremity Assessment: Defer to PT evaluation   Cervical / Trunk Assessment Cervical / Trunk Assessment: Normal   Communication Communication Communication: No difficulties   Cognition Arousal/Alertness: Awake/alert Behavior During Therapy: Anxious Overall Cognitive Status: Within Functional Limits for tasks assessed                     General Comments  Exercises       Shoulder Instructions      Home Living Family/patient expects to be discharged to:: Inpatient rehab Living Arrangements: Spouse/significant  other                               Additional Comments: Spouse can provide 24/7 S but per chart has dementai. Very supportive family.      Prior Functioning/Environment Level of Independence: Independent with assistive device(s)        Comments: Using cane/RW prior to admission due to recnet fem pop surgery. Before that Independent with ADL and mobility.    OT Diagnosis: Generalized weakness;Acute pain   OT Problem List: Decreased strength;Decreased range of motion;Decreased activity tolerance;Impaired balance (sitting and/or standing);Decreased safety awareness;Decreased knowledge of use of DME or AE;Decreased knowledge of precautions;Obesity;Pain   OT Treatment/Interventions: Self-care/ADL training;Therapeutic exercise;DME and/or AE instruction;Therapeutic activities;Patient/family education;Balance training    OT Goals(Current goals can be found in the care plan section) Acute Rehab OT Goals Patient Stated Goal: to be independent again OT Goal Formulation: With patient Time For Goal Achievement: 11/14/15 Potential to Achieve Goals: Good ADL Goals Pt Will Perform Lower Body Bathing: with min assist;sitting/lateral leans Pt Will Perform Lower Body Dressing: with min assist;sitting/lateral leans Pt Will Transfer to Toilet: with min guard assist;bedside commode;squat pivot transfer Pt/caregiver will Perform Home Exercise Program: Increased strength;Both right and left upper extremity;With theraband;With written HEP provided  OT Frequency: Min 2X/week   Barriers to D/C:            Co-evaluation PT/OT/SLP Co-Evaluation/Treatment: Yes Reason for Co-Treatment: Complexity of the patient's impairments (multi-system involvement);For patient/therapist safety   OT goals addressed during session: ADL's and self-care      End of Session Equipment Utilized During Treatment: Gait belt;Rolling walker;Oxygen Nurse Communication: Mobility status;Other (comment) (request for  "nerve pill")  Activity Tolerance: Patient tolerated treatment well Patient left: in chair;with call bell/phone within reach;with family/visitor present   Time: [id number] OT Time Calculation (min): 25 min Charges:  OT General Charges $OT Visit: 1 Procedure OT Evaluation $OT Eval Moderate Complexity: 1 Procedure G-Codes:    [name],[name] 11-16-15, 11:27 AM   Maurie Boettcher, OTR/L  [phone] 2015/11/16

## 2015-10-31 NOTE — Progress Notes (Signed)
Inpatient Diabetes Program Recommendations  AACE/ADA: New Consensus Statement on Inpatient Glycemic Control (2015)  Target Ranges:  Prepandial:   less than 140 mg/dL      Peak postprandial:   less than 180 mg/dL (1-2 hours)      Critically ill patients:  140 - 180 mg/dL  Results for [name], [name] (MRN [id number]) as of 10/31/2015 11:15  Ref. Range 08/17/2015 21:45 10/27/2015 14:52  Hemoglobin A1C Latest Ref Range: 4.8-5.6 % 7.8 (H) 8.6 (H)    Spoke with patient regarding home diabetes management.  She see's Dr. Cindie Laroche for her diabetes.  She states that if her CBG is less than 150 mg/dL at night, she reduces her home Lantus dose by 1/2 (Therefore she only takes Lantus 17 units q HS).  Discussed action of Lantus for basal insulin needs and that it mostly affects the fasting blood sugar.  Discussed results of A1C with patient.  She states that 8.6% is high for her. Encouraged her to follow-up with Dr. Cindie Laroche once home.  May also benefit from further outpatient diabetes education.  Thanks, Adah Perl, RN, BC-ADM Inpatient Diabetes Coordinator Pager [phone] (8a-5p)

## 2015-10-31 NOTE — Progress Notes (Signed)
I will begin insurance approval with [facility] for a possible admit to inpt rehab when medically ready. [id number]

## 2015-10-31 NOTE — Progress Notes (Addendum)
  [facility] Progress Note  Subjective  - POD #1  Patient is comfortable right now. Is on the phone and eating breakfast. States that she has no pain right now and that she has not used the PCA pump since last night. Denies any CP, palpitations, SOB, cough, N/V, weakness and dizziness.   Objective Filed Vitals:   10/31/15 0359 10/31/15 0800  BP:    Pulse:    Temp:    Resp: 10 12    Intake/Output Summary (Last 24 hours) at 10/31/15 [id number] Last data filed at 10/31/15 0500  Gross per 24 hour  Intake 1957.5 ml  Output      0 ml  Net 1957.5 ml   Right stump dressing in place with no visible drainage on the outside of the dressing Motor and sensation present in right stump  Assessment/Planning: 65 y.o. female is s/p: Right above knee amputation 1 Day Post-Op   WBC count at 16.9 from 13.87 yesterday; afebrile; continue with next dose of vancomycin Blood glucose levels ranging from 154-247; diabetic counselor consult in place to help manage blood glucose levels Has episodes of tachycardia from 110-123 and drops into 70s with one instance at 39. Patient states that this has been an ongoing problem. HR is currently in the 70s. We will continue to monitor. Continue incentive spirometry and encourage mobilization. PT and OT to follow. Continue Lovenox SQ injections  Fredric Mare  10/31/2015 8:42 AM --  Addendum Agree with the above assessment.  Leukocytosis at 16.9 this am. Likely reactive due to surgery. Is currently on vancomycin for 2 doses post-op surgery.  Had episode of tachycardia in 110s and 120s and remote drop to 39. Currently 7s. Asymptomatic. Possibly secondary to pain. Has seen cardiology in the past for tachycardia. Continue metoprolol 25 mg bid. Will continue to monitor.  AKA dressing clean. Will take down tomorrow.  Encouraged PCA use only as needed. Will d/c tomorrow.  PT/OT eval  Consult to diabetes coordinator pending.   Virgina Jock,  PA-C  Laboratory CBC    Component Value Date/Time   WBC 16.9* 10/31/2015 0609   HGB 9.6* 10/31/2015 0609   HCT 31.2* 10/31/2015 0609   PLT 398 10/31/2015 0609    BMET    Component Value Date/Time   NA 136 10/31/2015 0609   K 4.6 10/31/2015 0609   CL 96* 10/31/2015 0609   CO2 28 10/31/2015 0609   GLUCOSE 271* 10/31/2015 0609   BUN 14 10/31/2015 0609   CREATININE 0.85 10/31/2015 0609   CALCIUM 9.1 10/31/2015 0609   GFRNONAA >60 10/31/2015 0609   GFRAA >60 10/31/2015 0609    COAG Lab Results  Component Value Date   INR 0.9 11/09/2008   INR 1.0 04/12/2008   INR 0.9 04/11/2008   No results found for: PTT  Antibiotics Anti-infectives    Start     Dose/Rate Route Frequency Ordered Stop   10/30/15 2300  vancomycin (VANCOCIN) IVPB 1000 mg/200 mL premix     1,000 mg 200 mL/hr over 60 Minutes Intravenous Every 12 hours 10/30/15 2006 10/31/15 2259   10/30/15 1100  vancomycin (VANCOCIN) IVPB 1000 mg/200 mL premix     1,000 mg 200 mL/hr over 60 Minutes Intravenous To [facility] 10/27/15 0819 10/30/15 1351       Kayla Checkovich, PA-S  10/31/2015 8:42 AM     Agree with the above Dressing dry, plan for change tomorrow  Annamarie Major

## 2015-10-31 NOTE — Progress Notes (Signed)
Spoke with rounding doctor this am concerning patient having bradycardic episodes at times. Patient has been tachycardic in the 110's and on one occasion dropped to 39. She was asymptomatic with that. At times her HR will stay around the 60's and then go back up to the 110's. Physical activity has not been a factor. Will continue to monitor and notify MD as needed.

## 2015-10-31 NOTE — Clinical Social Work Note (Signed)
Clinical Social Work Assessment  Patient Details  Name: [name] MRN: [id number] Date of Birth: [birth year]  Date of referral:  10/31/15               Reason for consult:  Facility Placement                Permission sought to share information with:  Facility Sport and exercise psychologist, Family Supports Permission granted to share information::  Yes, Verbal Permission Granted  Name::     [name]  Agency::  [facility] SNFs  Relationship::  Spouse  Contact Information:  [phone]  Housing/Transportation Living arrangements for the past 2 months:  [place] of Information:  Patient, Adult Children, Spouse Patient Interpreter Needed:  None Criminal Activity/Legal Involvement Pertinent to Current Situation/Hospitalization:  No - Comment as needed Significant Relationships:  Adult Children, Spouse Lives with:  Spouse Do you feel safe going back to the place where you live?  No Need for family participation in patient care:  Yes (Comment)  Care giving concerns:  CSW received referral for possible SNF placement at time of discharge. CSW met with patient and patient's husband at bedside regarding likely PT recommendation of SNF placement at time of discharge. Patient is currently unable to care for herself at their home given patient's current physical needs and fall risk. Patient and patient's husband expressed understanding of PT recommendation and are agreeable to SNF placement at time of discharge if CIR is unable to admit patient. CSW to continue to follow and assist with discharge planning needs.   Social Worker assessment / plan:  CSW spoke with patient and patient's husband concerning possibility of rehab at [facility] before returning home.  Employment status:  Retired Nurse, adult PT Recommendations:  [place], [place], Not assessed at this time Information / Referral to community resources:  [place]  Patient/Family's Response to care:  Patient and patient's husband recognize need for rehab before returning home and are agreeable to a SNF in [facility] if CIR is unable to admit.  Patient/Family's Understanding of and Emotional Response to Diagnosis, Current Treatment, and Prognosis:  Patient is realistic regarding therapy needs. No questions/concerns about plan or treatment.    Emotional Assessment Appearance:  Appears stated age Attitude/Demeanor/Rapport:   (Appropriate) Affect (typically observed):  Accepting, Appropriate Orientation:  Oriented to Self, Oriented to Place, Oriented to  Time, Oriented to Situation Alcohol / Substance use:  Not Applicable Psych involvement (Current and /or in the community):  No (Comment)  Discharge Needs  Concerns to be addressed:  Care Coordination Readmission within the last 30 days:  No Current discharge risk:  None Barriers to Discharge:  Continued Medical Work up   [employer], [place] 10/31/2015, 11:45 AM

## 2015-10-31 NOTE — Progress Notes (Signed)
I met with pt and her spouse at bedside to discuss her options for rehab. They do not want to pay the copays for CIR when [employer] medicare will cover SNF 100%. Therefore they choose SNF rehab. I have alerted RN CM and SW. [phone] 

## 2015-10-31 NOTE — Evaluation (Signed)
Physical Therapy Evaluation Patient Details Name: [name] MRN: [id number] DOB: 1950/12/13 Today's Date: 10/31/2015   History of Present Illness  65 y.o. right handed female with history of hypertension, COPD with remote tobacco abuse, chronic low back pain, diabetes mellitus with peripheral neuropathy, PVD status post recent right femoral to below-knee popliteal bypass graft and thrombectomy with fasciotomy. Underwent right AKA 10/30/2015 due to progressive ischemic changes.  Clinical Impression  Patient presents with decreased mobility due to deficits listed in PT problem list.  Feel she will benefit from skilled PT in the acute setting to allow return home with family assist following CIR level rehab stay.    Follow Up Recommendations CIR    Equipment Recommendations  Wheelchair (measurements PT);Wheelchair cushion (measurements PT)    Recommendations for Other Services       Precautions / Restrictions Precautions Precautions: Fall      Mobility  Bed Mobility Overal bed mobility: Needs Assistance Bed Mobility: Supine to Sit     Supine to sit: Min assist;HOB elevated     General bed mobility comments: attempting to pull up on railing, but got stuck and needed help lifting trunk  Transfers Overall transfer level: Needs assistance Equipment used: Rolling walker (2 wheeled);2 person hand held assist Transfers: Sit to/from [employer] Transfers Sit to Stand: Min assist;+2 physical assistance Stand pivot transfers: Mod assist;+2 physical assistance Squat pivot transfers: Mod assist;+2 physical assistance     General transfer comment: pivot to [facility], then up from [facility] in standing to take steps to recliner with RW  Ambulation/Gait                Stairs            Wheelchair Mobility    Modified Rankin (Stroke Patients Only)       Balance Overall balance assessment: Needs assistance Sitting-balance support: Bilateral upper  extremity supported Sitting balance-Leahy Scale: Fair     Standing balance support: Bilateral upper extremity supported Standing balance-Leahy Scale: Poor Standing balance comment: UE support needed for balance due to AKA                             Pertinent Vitals/Pain Pain Assessment: 0-10 Pain Score: 8  Pain Location: R LE Pain Descriptors / Indicators: Sore Pain Intervention(s): Limited activity within patient's tolerance;PCA encouraged;Monitored during session    Home Living Family/patient expects to be discharged to:: Inpatient rehab Living Arrangements: Spouse/significant other               Additional Comments: Spouse can provide 24/7 S but per chart has dementia. Very supportive family.    Prior Function Level of Independence: Independent with assistive device(s)         Comments: Using cane/RW prior to admission due to recnet fem pop surgery. Before that Independent with ADL and mobility.     Hand Dominance   Dominant Hand: Right    Extremity/Trunk Assessment   Upper Extremity Assessment: Defer to OT evaluation           Lower Extremity Assessment: RLE deficits/detail RLE Deficits / Details: R AKA lifts antigravity independently,     Cervical / Trunk Assessment: Normal  Communication   Communication: No difficulties  Cognition Arousal/Alertness: Awake/alert Behavior During Therapy: Anxious Overall Cognitive Status: Within Functional Limits for tasks assessed  General Comments      Exercises        Assessment/Plan    PT Assessment Patient needs continued PT services  PT Diagnosis Generalized weakness;Acute pain;Difficulty walking   PT Problem List Decreased strength;Decreased knowledge of use of DME;Decreased activity tolerance;Decreased safety awareness;Decreased balance;Pain;Decreased mobility  PT Treatment Interventions DME instruction;Balance training;Gait training;Functional mobility  training;Patient/family education;Therapeutic activities;Therapeutic exercise;Wheelchair mobility training   PT Goals (Current goals can be found in the Care Plan section) Acute Rehab PT Goals Patient Stated Goal: to be independent again PT Goal Formulation: With patient/family Time For Goal Achievement: 11/07/15 Potential to Achieve Goals: Good    Frequency Min 3X/week   Barriers to discharge        Co-evaluation PT/OT/SLP Co-Evaluation/Treatment: Yes Reason for Co-Treatment: Complexity of the patient's impairments (multi-system involvement);For patient/therapist safety PT goals addressed during session: Proper use of DME;Balance;Mobility/safety with mobility OT goals addressed during session: ADL's and self-care       End of Session Equipment Utilized During Treatment: Gait belt Activity Tolerance: Patient limited by pain Patient left: in chair;with call bell/phone within reach;with family/visitor present           Time: 1046-1110 PT Time Calculation (min) (ACUTE ONLY): 24 min   Charges:   PT Evaluation $PT Eval Moderate Complexity: 1 Procedure     PT G CodesReginia Naas November 21, 2015, 12:24 PM  [name], [place] 2015/11/21

## 2015-11-01 LAB — CBC
HEMATOCRIT: 29.8 % — AB (ref 36.0–46.0)
Hemoglobin: 8.6 g/dL — ABNORMAL LOW (ref 12.0–15.0)
MCH: 24.7 pg — AB (ref 26.0–34.0)
MCHC: 28.9 g/dL — ABNORMAL LOW (ref 30.0–36.0)
MCV: 85.6 fL (ref 78.0–100.0)
PLATELETS: 463 10*3/uL — AB (ref 150–400)
RBC: 3.48 MIL/uL — AB (ref 3.87–5.11)
RDW: 18.2 % — ABNORMAL HIGH (ref 11.5–15.5)
WBC: 14.3 10*3/uL — ABNORMAL HIGH (ref 4.0–10.5)

## 2015-11-01 LAB — GLUCOSE, CAPILLARY
GLUCOSE-CAPILLARY: 94 mg/dL (ref 65–99)
Glucose-Capillary: 112 mg/dL — ABNORMAL HIGH (ref 65–99)
Glucose-Capillary: 104 mg/dL — ABNORMAL HIGH (ref 65–99)
Glucose-Capillary: 101 mg/dL — ABNORMAL HIGH (ref 65–99)

## 2015-11-01 LAB — BASIC METABOLIC PANEL
ANION GAP: 11 (ref 5–15)
BUN: 22 mg/dL — AB (ref 6–20)
CHLORIDE: 97 mmol/L — AB (ref 101–111)
CO2: 27 mmol/L (ref 22–32)
CREATININE: 1.43 mg/dL — AB (ref 0.44–1.00)
Calcium: 8.6 mg/dL — ABNORMAL LOW (ref 8.9–10.3)
GFR calc Af Amer: 43 mL/min — ABNORMAL LOW (ref >60)
GFR calc non Af Amer: 38 mL/min — ABNORMAL LOW (ref >60)
Glucose, Bld: 151 mg/dL — ABNORMAL HIGH (ref 65–99)
POTASSIUM: 4.2 mmol/L (ref 3.5–5.1)
Sodium: 135 mmol/L (ref 135–145)

## 2015-11-01 MED ORDER — OXYCODONE-ACETAMINOPHEN 10-325 MG PO TABS: 1.0000 | ORAL_TABLET | Freq: Three times a day (TID) | ORAL | Status: DC

## 2015-11-01 MED ORDER — METOPROLOL TARTRATE 25 MG PO TABS
25.0000 mg | ORAL_TABLET | Freq: Two times a day (BID) | ORAL | Status: DC
  Administered 2015-11-02 – 2015-11-03 (×2): 25 mg via ORAL
  Filled 2015-11-01 (×3): qty 1

## 2015-11-01 MED ORDER — SODIUM CHLORIDE 0.9 % IV BOLUS (SEPSIS)
500.0000 mL | Freq: Once | INTRAVENOUS | Status: AC
  Administered 2015-11-01: 500 mL via INTRAVENOUS

## 2015-11-01 MED ORDER — OXYCODONE-ACETAMINOPHEN 5-325 MG PO TABS
1.0000 | ORAL_TABLET | ORAL | Status: DC | PRN
  Administered 2015-11-01 – 2015-11-03 (×5): 2 via ORAL
  Filled 2015-11-01 (×5): qty 2

## 2015-11-01 MED ORDER — MORPHINE SULFATE (PF) 2 MG/ML IV SOLN: 1.0000 mg | INTRAVENOUS | Status: DC | PRN

## 2015-11-01 MED ORDER — IPRATROPIUM-ALBUTEROL 0.5-2.5 (3) MG/3ML IN SOLN
3.0000 mL | Freq: Three times a day (TID) | RESPIRATORY_TRACT | Status: DC
Start: 2015-11-01 — End: 2015-11-02
  Administered 2015-11-01 – 2015-11-02 (×5): 3 mL via RESPIRATORY_TRACT
  Filled 2015-11-01 (×4): qty 3

## 2015-11-01 NOTE — Clinical Documentation Improvement (Addendum)
Vascular Surgery  (Query responses must be documented in the current medical record, not on the CDI BPA form.)  Possible Clinical Conditions:  - Acute Kidney Injury (AKI), including any associated cause(s) and/or condition(s)  - Other condition  - Unable to clinically determine  Clinical Information: BUN/Cr/GFR trend this admission.  (white female) Component     Latest Ref Rng 10/27/15 10/30/2015 10/31/2015 11/01/2015  BUN     6 - 20 mg/dL 12  14 22  (H)  Creatinine     0.44 - 1.00 mg/dL 1.00 0.90 0.85 1.43 (H)  EGFR (Non-African Amer.)     >60 mL/min 58 >60 >60 38 (L)  Patient's creatinine increased by 0.53 mg/dL in a 24 hour period from 10/30/15 to 10/31/15 Patient's creatinine increased by 0.58 mg/dL in a 48 hour period from 10/30/15 to 11/01/15 1000 ml intake during surgery per review of Anesthesia Record Systolic BP between 80 to 002 systolic during surgery at times and was given 4 mg of neostigmine and 10 mg of ephedrine BP postoperatively running from the 62'W to 549'A systolic Known history of Hypertension and DM2   Please exercise your independent, professional judgment when responding. A specific answer is not anticipated or expected.   Thank You, Erling Conte  RN BSN CCDS [phone] Health Information Management [place]

## 2015-11-01 NOTE — Discharge Summary (Signed)
[facility] Discharge Summary  [name] 11/18/1950 65 y.o. 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 female presented for follow-up after right femoral to below-knee popliteal bypass and thrombectomy of her tibial vessels for acute on chronic ischemia. This was 7 months ago. She also had a fasciotomy and returns today for follow-up evaluation of her fasciotomy wound . She is on Aspirin for antiplatelet therapy. She states she was in the hospital for a COPD exacerbation recently. During the time of that hospital admission a pre-existing right leg fasciotomy wound which had completely healed has now reopened. She does not complain of pain in her foot. However, today on her noninvasive vascular exam she was noted to have minimal flow in the right foot and occlusion of her right femoropopliteal bypass. Her atherosclerotic risk factors remain diabetes, hypertension, smoking. She has not smoked for several months. These are all currently stable and followed by the primary care physician. The patient was minimally active prior to her operation. She remains minimally active now and has to be encouraged to walk more. She is currently ambulating. She had one vessel runoff via the posterior tibial artery at the conclusion of the case.   Hospital Course:  The patient was admitted to the hospital and taken to the operating room on 10/30/2015 and underwent: right above-the-knee amputation    The patient tolerated the procedure well and was transported to the PACU in stable condition. She was started on a PCA pump.   POD 1: She had some tachycardia in the 110s and an episode of bradycardia at 39 early that morning. She has a history of tachycardia that has been followed by cardiology. Her heart rate  was in the 70s when she was evaluated by [facility] that am. She was asymptomatic. Her AKA dressing was clean. A diabetes coordinator was consulted.  POD 2: Her dressing was taken down and her right stump was healing well. Her PCA was discontinued. Her 02 was weaned. She had acute blood loss anemia that she was tolerating. She had a serum creatinine of 1.4, that was up from 0.85. This was believed to be secondary to dehydration. Her fluids were restarted. Her lisinopril was discontinued.  Her blood pressure was also soft in the 80s and she received 2 fluid boluses. The patient was asymptomatic. Her metoprolol was also held. She did have an increase in her heart rate at 119. The patient remained asymptomatic.   POD 3: The patient's creatinine remained at 1.4. She was continued on IV fluids. Her blood pressure increased to systolic 0000000. Her tachycardia had resolved. A urinalysis was ordered for tea colored urine. This was negative for infection and proteinuria. Her hemoglobin remained stable at 8.9. The patient was recommended CIR, but the patient opted for SNF secondary to insurance purposes. She was kept in the hospital for additional hydration and blood pressure management.   POD 4: The patient's stump was healing well. Her hypotension had resolved and her AKI secondary to dehydration was resolved. She was restarted on metoprolol for her tachycardia. Her lisinopril was held and can be restarted tomorrow. She was asymptomatic. She had tachycardia prior to admission. She was discharged to SNF on POD 4 in good condition.    CBC    Component Value Date/Time   WBC 14.3* 11/01/2015  0420   RBC 3.48* 11/01/2015 0420   HGB 8.6* 11/01/2015 0420   HCT 29.8* 11/01/2015 0420   PLT 463* 11/01/2015 0420   MCV 85.6 11/01/2015 0420   MCH 24.7* 11/01/2015 0420   MCHC 28.9* 11/01/2015 0420   RDW 18.2* 11/01/2015 0420   LYMPHSABS 2.0 08/17/2015 2145   MONOABS 1.0 08/17/2015 2145   EOSABS 0.3 08/17/2015 2145   BASOSABS  0.0 08/17/2015 2145    BMET    Component Value Date/Time   NA 135 11/01/2015 0420   K 4.2 11/01/2015 0420   CL 97* 11/01/2015 0420   CO2 27 11/01/2015 0420   GLUCOSE 151* 11/01/2015 0420   BUN 22* 11/01/2015 0420   CREATININE 1.43* 11/01/2015 0420   CALCIUM 8.6* 11/01/2015 0420   GFRNONAA 38* 11/01/2015 0420   GFRAA 43* 11/01/2015 0420     Discharge Instructions:   The patient is discharged to SNF with extensive instructions on wound care and progressive ambulation.  They are instructed not to drive or perform any heavy lifting until returning to see the physician in his office.  Discharge Instructions    Ambulatory referral to Nutrition and Diabetic Education    Complete by:  As directed   Pt. Lives in [place].  F/U with Jearld Fenton, CDE.           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 S/P AKA (above knee amputation) unilateral ([place])   . Abnormality of gait   . Acute blood loss anemia   . Postoperative pain of extremity   . Phantom limb pain ([place])   . Essential hypertension   . Chronic obstructive pulmonary disease ([place])   . Chronic low back pain   . Poorly controlled type 2 diabetes mellitus with peripheral neuropathy ([place])   . PVD (peripheral vascular disease) ([place])   . Respiratory rate decreased   . Leukocytosis   . Atherosclerosis of right leg ([place]) 10/30/2015  . Pulmonary edema 08/18/2015  . CHF exacerbation ([place]) 08/18/2015  . SOB (shortness of breath) 08/18/2015  . Open wound of knee, leg (except thigh), and ankle 08/18/2015  . Abnormal echocardiogram   . SVT (supraventricular tachycardia) ([place])   . Lower limb ischemia 04/09/2015  . Critical lower limb ischemia 04/09/2015  . AKI (acute kidney injury) ([place]) 04/09/2015  . Hyperkalemia 04/09/2015  . Pancreatitis 12/26/2013  . Tachycardia 12/26/2013  . Dehydration  12/26/2013  . Vomiting 12/26/2013  . COPD (chronic obstructive pulmonary disease) ([place]) 12/26/2013  . Essential hypertension, benign 12/26/2013  . Diabetes ([place]) 12/26/2013  . Hypomagnesemia 12/26/2013   Past Medical History  Diagnosis Date  . Hypertension   . COPD (chronic obstructive pulmonary disease) ([place])   . Stroke ([place])   . Chronic lower back pain   . Shingles Dec. 2016    Scalp  . Peripheral vascular disease ([place])   . GERD (gastroesophageal reflux disease)   . Hyperlipidemia   . Anxiety   . Type II diabetes mellitus ([place])   . Arthritis     "back" (10/30/2015)       Medication List    ASK your doctor about these medications        albuterol 108 (90 Base) MCG/ACT inhaler  Commonly known as:  PROVENTIL HFA;VENTOLIN HFA  Inhale 2 puffs into the lungs every 6 (six) hours as needed for wheezing or shortness of breath.  ALPRAZolam 0.25 MG tablet  Commonly known as:  XANAX  Take 0.25 mg by mouth 4 (four) times daily as needed for anxiety.     aspirin 325 MG tablet  Take 1 tablet (325 mg total) by mouth daily.     atorvastatin 20 MG tablet  Commonly known as:  LIPITOR  Take 1 tablet (20 mg total) by mouth daily at 6 PM.     citalopram 20 MG tablet  Commonly known as:  CELEXA  Take 20 mg by mouth daily.     dextromethorphan-guaiFENesin 30-600 MG 12hr tablet  Commonly known as:  MUCINEX DM  Take 1 tablet by mouth 2 (two) times daily as needed for cough.     gabapentin 300 MG capsule  Commonly known as:  NEURONTIN  Take 300 mg by mouth 3 (three) times daily.     ipratropium-albuterol 0.5-2.5 (3) MG/3ML Soln  Commonly known as:  DUONEB  Take 3 mLs by nebulization every 8 (eight) hours.     LANTUS SOLOSTAR 100 UNIT/ML Solostar Pen  Generic drug:  Insulin Glargine  Inject 34 Units into the skin at bedtime.     lisinopril 20 MG tablet  Commonly known as:  PRINIVIL,ZESTRIL  Take 20 mg by mouth daily.     metFORMIN 500 MG tablet  Commonly known as:   GLUCOPHAGE  Take 500 mg by mouth 2 (two) times daily with a meal.     metoprolol tartrate 25 MG tablet  Commonly known as:  LOPRESSOR  Take 25 mg by mouth 2 (two) times daily.     naproxen 500 MG tablet  Commonly known as:  NAPROSYN  Take 500 mg by mouth 2 (two) times daily as needed for mild pain or moderate pain.     oxyCODONE-acetaminophen 10-325 MG tablet  Commonly known as:  PERCOCET  Take 1 tablet by mouth every 8 (eight) hours.     pantoprazole 40 MG tablet  Commonly known as:  PROTONIX  Take 40 mg by mouth daily.     varenicline 0.5 MG tablet  Commonly known as:  CHANTIX  Take 2 tablets (1 mg total) by mouth 2 (two) times daily.        Percocet #30 No Refill  Disposition: SNF  Patient's condition: is Good  Follow up: 1. Dr. Oneida Alar in 4 weeks   Virgina Jock, PA-C [facility] [phone] 11/01/2015  2:25 PM

## 2015-11-01 NOTE — Progress Notes (Signed)
  [facility] Progress Note  Subjective  - POD #2  States that pain is better and that she has not had to use the pain pump. No other questions or concerns at this time.  Objective Filed Vitals:   11/01/15 0831 11/01/15 0832  BP: 81/52 98/54  Pulse:    Temp:    Resp:      Intake/Output Summary (Last 24 hours) at 11/01/15 0841 Last data filed at 11/01/15 0500  Gross per 24 hour  Intake   1800 ml  Output    100 ml  Net   1700 ml    Incision is clean, dry and staples are intact. No erythema or hematoma; no drainage on dressing when removed Right stump is mildly swollen  Assessment/Planning: 65 y.o. female is s/p: Right above knee amputation 2 Days Post-Op   Discharge PCA pump and give PO pain medications PRN PT/OT have consulted patient and both recommend continuing PT in an acute setting after discharge. Pt and spouse prefer to go to SNF after discharge.  Pt currently on O2 via nasal cannula, will wean patient off of O2 to room air however if unable to do so then discharge with home O2 WBC count to 14.3 from 16.9 however patient remains afebrile; will continue to monitor and redraw lab work in am Hemoglobin drop to 8.6 from 9.6; patient is asymptomatic thus we will monitor Blood glucose is better this am with fasting at 112, will continue recommended treatment from diabetes coordinator Possible discharge tomorrow   [name] 11/01/2015 8:41 AM -- Agree with above AKA looks good Will wean off PCA to oral meds Wean O2 Hopefully to SNF or Rehab tomorrow Acute blood loss anemia trend for now transfuse for HGB less than 8 with underlying pulmonary function  Ruta Hinds, MD [facility] [place]: [phone] Pager: [phone]  Laboratory CBC    Component Value Date/Time   WBC 14.3* 11/01/2015 0420   HGB 8.6* 11/01/2015 0420   HCT 29.8* 11/01/2015 0420   PLT 463* 11/01/2015 0420    BMET    Component  Value Date/Time   NA 135 11/01/2015 0420   K 4.2 11/01/2015 0420   CL 97* 11/01/2015 0420   CO2 27 11/01/2015 0420   GLUCOSE 151* 11/01/2015 0420   BUN 22* 11/01/2015 0420   CREATININE 1.43* 11/01/2015 0420   CALCIUM 8.6* 11/01/2015 0420   GFRNONAA 38* 11/01/2015 0420   GFRAA 43* 11/01/2015 0420    COAG Lab Results  Component Value Date   INR 0.9 11/09/2008   INR 1.0 04/12/2008   INR 0.9 04/11/2008   No results found for: PTT  Antibiotics Anti-infectives    Start     Dose/Rate Route Frequency Ordered Stop   10/30/15 2300  vancomycin (VANCOCIN) IVPB 1000 mg/200 mL premix     1,000 mg 200 mL/hr over 60 Minutes Intravenous Every 12 hours 10/30/15 2006 10/31/15 1218   10/30/15 1100  vancomycin (VANCOCIN) IVPB 1000 mg/200 mL premix     1,000 mg 200 mL/hr over 60 Minutes Intravenous To ShortStay Surgical 10/27/15 0819 10/30/15 1351       Kayla Checkovich, PA-S 11/01/2015 8:41 AM

## 2015-11-02 ENCOUNTER — Telehealth: Payer: Self-pay | Admitting: Vascular Surgery

## 2015-11-02 LAB — CBC
HEMATOCRIT: 30.7 % — AB (ref 36.0–46.0)
Hemoglobin: 8.9 g/dL — ABNORMAL LOW (ref 12.0–15.0)
MCH: 24.9 pg — ABNORMAL LOW (ref 26.0–34.0)
MCHC: 29 g/dL — ABNORMAL LOW (ref 30.0–36.0)
MCV: 86 fL (ref 78.0–100.0)
PLATELETS: 449 10*3/uL — AB (ref 150–400)
RBC: 3.57 MIL/uL — AB (ref 3.87–5.11)
RDW: 18.1 % — ABNORMAL HIGH (ref 11.5–15.5)
WBC: 11 10*3/uL — AB (ref 4.0–10.5)

## 2015-11-02 LAB — URINALYSIS, ROUTINE W REFLEX MICROSCOPIC
BILIRUBIN URINE: NEGATIVE
Glucose, UA: NEGATIVE mg/dL
Hgb urine dipstick: NEGATIVE
KETONES UR: NEGATIVE mg/dL
LEUKOCYTES UA: NEGATIVE
NITRITE: NEGATIVE
PH: 5 (ref 5.0–8.0)
Protein, ur: NEGATIVE mg/dL
SPECIFIC GRAVITY, URINE: 1.012 (ref 1.005–1.030)

## 2015-11-02 LAB — BASIC METABOLIC PANEL
Anion gap: 12 (ref 5–15)
BUN: 26 mg/dL — ABNORMAL HIGH (ref 6–20)
CHLORIDE: 102 mmol/L (ref 101–111)
CO2: 25 mmol/L (ref 22–32)
Calcium: 9 mg/dL (ref 8.9–10.3)
Creatinine, Ser: 1.47 mg/dL — ABNORMAL HIGH (ref 0.44–1.00)
GFR, EST AFRICAN AMERICAN: 42 mL/min — AB (ref >60)
GFR, EST NON AFRICAN AMERICAN: 36 mL/min — AB (ref >60)
Glucose, Bld: 109 mg/dL — ABNORMAL HIGH (ref 65–99)
POTASSIUM: 4.9 mmol/L (ref 3.5–5.1)
SODIUM: 139 mmol/L (ref 135–145)

## 2015-11-02 LAB — GLUCOSE, CAPILLARY
GLUCOSE-CAPILLARY: 140 mg/dL — AB (ref 65–99)
GLUCOSE-CAPILLARY: 92 mg/dL (ref 65–99)
GLUCOSE-CAPILLARY: 71 mg/dL (ref 65–99)
Glucose-Capillary: 94 mg/dL (ref 65–99)

## 2015-11-02 NOTE — Progress Notes (Signed)
  [facility] Progress Note  Subjective  - POD #3  Has some soreness in her stump but nothing unbearable. Denies any lightheaded/dizziness and weakness. States that she has been urinating at least 3-4 times a day. Nursing says that urine has been dark tea colored but patient states that it is normal.  Objective Filed Vitals:   11/01/15 2103 11/02/15 0312  BP: 80/52 90/55  Pulse: 116 123  Temp: 98 F (36.7 C) 98.2 F (36.8 C)  Resp: 18 18    Intake/Output Summary (Last 24 hours) at 11/02/15 0725 Last data filed at 11/02/15 0300  Gross per 24 hour  Intake    240 ml  Output    300 ml  Net    -60 ml    Incision is dry and clean with staples intact, no erythema Very slight tenderness to palpation   Assessment/Planning: 65 y.o. female is s/p: Right above knee amuptation 3 Days Post-Op    Creatinine levels are still trending upward, now at 1.47 from 1.43 with BUN at 26 from 22 and GFR at 36. Urinalysis to assess tea-colored urine and kidney function Blood pressure ranging from 80/52-90/55 overnight. Pt previously [id number] systolic before surgery.  Will discuss with Dr. Oneida Alar as to whether or not we should consult Hospitalist or Nephrology in regards to kidney function and blood pressure Will discuss discharge today with Dr. Ruben Reason, [facility] 11/02/2015 7:25 AM -- Hypotensive yesterday.  ACE and beta blocker on hold.  Anemic but not enough to transfuse.  Hydrate and encourage PO intake today.  Possible d/c am if BP improved  Ruta Hinds, MD [facility] [place]: [phone] Pager: [phone]  Laboratory CBC    Component Value Date/Time   WBC 11.0* 11/02/2015 0350   HGB 8.9* 11/02/2015 0350   HCT 30.7* 11/02/2015 0350   PLT 449* 11/02/2015 0350    BMET    Component Value Date/Time   NA 139 11/02/2015 0350   K 4.9 11/02/2015 0350   CL 102 11/02/2015 0350   CO2 25 11/02/2015 0350   GLUCOSE 109*  11/02/2015 0350   BUN 26* 11/02/2015 0350   CREATININE 1.47* 11/02/2015 0350   CALCIUM 9.0 11/02/2015 0350   GFRNONAA 36* 11/02/2015 0350   GFRAA 42* 11/02/2015 0350    COAG Lab Results  Component Value Date   INR 0.9 11/09/2008   INR 1.0 04/12/2008   INR 0.9 04/11/2008   No results found for: PTT  Antibiotics Anti-infectives    Start     Dose/Rate Route Frequency Ordered Stop   10/30/15 2300  vancomycin (VANCOCIN) IVPB 1000 mg/200 mL premix     1,000 mg 200 mL/hr over 60 Minutes Intravenous Every 12 hours 10/30/15 2006 10/31/15 1218   10/30/15 1100  vancomycin (VANCOCIN) IVPB 1000 mg/200 mL premix     1,000 mg 200 mL/hr over 60 Minutes Intravenous To ShortStay Surgical 10/27/15 0819 10/30/15 1351       Kayla Checkovich, PA-S 11/02/2015 7:25 AM

## 2015-11-02 NOTE — Telephone Encounter (Signed)
Spoke with pt to schedule, dpm °

## 2015-11-02 NOTE — Care Management Important Message (Signed)
Important Message  Patient Details  Name: [name] MRN: [id number] Date of Birth: 1950/09/21   Medicare Important Message Given:  Yes    Nathen May 11/02/2015, 12:25 PM

## 2015-11-02 NOTE — Telephone Encounter (Signed)
-----   Message from Mena Goes, RN sent at 11/01/2015  4:24 PM EST ----- Regarding: schedule   ----- Message -----    From: Alvia Grove, PA-C    Sent: 11/01/2015   3:42 PM      To: Vvs Charge Pool  S/p right AKA 10/30/15  F/u with Dr. Oneida Alar in 4 weeks   Thanks Maudie Mercury

## 2015-11-02 NOTE — Progress Notes (Signed)
Physical Therapy Treatment Patient Details Name: [name] MRN: [id number] DOB: 1951/06/13 Today's Date: 11/02/2015    History of Present Illness 65 y.o. right handed female with history of hypertension, COPD with remote tobacco abuse, chronic low back pain, diabetes mellitus with peripheral neuropathy, PVD status post recent right femoral to below-knee popliteal bypass graft and thrombectomy with fasciotomy. Underwent right AKA 10/30/2015 due to progressive ischemic changes.    PT Comments    Pt performed stand pivot with RW from bed to chair with +2 mod assist.  On arrival pt found with urinary incontinence and bandage/ace wrap was removed.  PTA re-wrapped residual limb and bandage remains to fall off during mobility.    Follow Up Recommendations  CIR     Equipment Recommendations  Wheelchair (measurements PT);Wheelchair cushion (measurements PT)    Recommendations for Other Services       Precautions / Restrictions Precautions Precautions: Fall    Mobility  Bed Mobility Overal bed mobility: Needs Assistance Bed Mobility: Supine to Sit;Sit to Supine;Rolling     Supine to sit: Min assist;Mod assist;+2 for physical assistance (intially required +2 mod assist due to trunk instability, on repeated attempt pt required +1 min assist with better carryover with body position and hand placement.  ) Sit to supine: Min assist   General bed mobility comments: Pt found with urinary incontinence post sitting edge of bed, required return to supine for multiple bouts of rolling to perform perianal care.    Transfers Overall transfer level: Needs assistance Equipment used: Rolling walker (2 wheeled) Transfers: Sit to/from Stand Sit to Stand: Mod assist;+2 physical assistance Stand pivot transfers: Mod assist;+2 physical assistance       General transfer comment: Pt required turns for sequencing, turning, backing and RW placement.  Pt demonstrated shirt shuffling hops and pivoting on  LLE.    Ambulation/Gait                 Stairs            Wheelchair Mobility    Modified Rankin (Stroke Patients Only)       Balance     Sitting balance-Leahy Scale: Fair       Standing balance-Leahy Scale: Poor                      Cognition Arousal/Alertness: Awake/alert Behavior During Therapy: WFL for tasks assessed/performed Overall Cognitive Status: Within Functional Limits for tasks assessed                      Exercises      General Comments        Pertinent Vitals/Pain Pain Score: 8  Pain Location: RLE with c/o pain in low back Pain Descriptors / Indicators: Sore Pain Intervention(s): Limited activity within patient's tolerance;Repositioned    Home Living                      Prior Function            PT Goals (current goals can now be found in the care plan section) Acute Rehab PT Goals Patient Stated Goal: to be independent again Potential to Achieve Goals: Good Progress towards PT goals: Progressing toward goals    Frequency  Min 3X/week    PT Plan      Co-evaluation             End of Session Equipment Utilized During Treatment: Gait belt Activity  Tolerance: Patient limited by pain Patient left: in chair;with call bell/phone within reach;with family/visitor present     Time: [id number] PT Time Calculation (min) (ACUTE ONLY): 24 min  Charges:  $Therapeutic Activity: 23-37 mins                    G Codes:      Cristela Blue November 19, 2015, 10:36 AM  Governor Rooks, PTA pager [phone]

## 2015-11-02 NOTE — Progress Notes (Signed)
Patient returned to bed after using bedside commode.  Patient wanted to rest. Pt resting with call bell within reach.  Will continue to monitor. Payton Emerald, RN

## 2015-11-03 ENCOUNTER — Emergency Department (HOSPITAL_COMMUNITY): Payer: Medicare Other

## 2015-11-03 ENCOUNTER — Encounter (HOSPITAL_COMMUNITY): Payer: Self-pay

## 2015-11-03 ENCOUNTER — Inpatient Hospital Stay (HOSPITAL_COMMUNITY)
Admission: EM | Admit: 2015-11-03 | Discharge: 2015-11-09 | DRG: 292 | Disposition: A | Discharge status: Skilled Nursing Facility | Payer: Medicare Other | Attending: Family Medicine | Admitting: Family Medicine

## 2015-11-03 DIAGNOSIS — E1165 Type 2 diabetes mellitus with hyperglycemia: Secondary | ICD-10-CM | POA: Diagnosis present

## 2015-11-03 DIAGNOSIS — I739 Peripheral vascular disease, unspecified: Secondary | ICD-10-CM

## 2015-11-03 DIAGNOSIS — I10 Essential (primary) hypertension: Secondary | ICD-10-CM

## 2015-11-03 DIAGNOSIS — E0810 Diabetes mellitus due to underlying condition with ketoacidosis without coma: Secondary | ICD-10-CM

## 2015-11-03 DIAGNOSIS — R000 Tachycardia, unspecified: Secondary | ICD-10-CM

## 2015-11-03 DIAGNOSIS — R0602 Shortness of breath: Secondary | ICD-10-CM | POA: Diagnosis not present

## 2015-11-03 DIAGNOSIS — I5022 Chronic systolic (congestive) heart failure: Secondary | ICD-10-CM | POA: Diagnosis present

## 2015-11-03 DIAGNOSIS — Z89619 Acquired absence of unspecified leg above knee: Secondary | ICD-10-CM

## 2015-11-03 DIAGNOSIS — K219 Gastro-esophageal reflux disease without esophagitis: Secondary | ICD-10-CM | POA: Diagnosis present

## 2015-11-03 DIAGNOSIS — J209 Acute bronchitis, unspecified: Secondary | ICD-10-CM | POA: Diagnosis present

## 2015-11-03 DIAGNOSIS — I110 Hypertensive heart disease with heart failure: Principal | ICD-10-CM | POA: Diagnosis present

## 2015-11-03 DIAGNOSIS — J90 Pleural effusion, not elsewhere classified: Secondary | ICD-10-CM

## 2015-11-03 DIAGNOSIS — R0902 Hypoxemia: Secondary | ICD-10-CM | POA: Diagnosis present

## 2015-11-03 DIAGNOSIS — Z794 Long term (current) use of insulin: Secondary | ICD-10-CM

## 2015-11-03 DIAGNOSIS — Z8249 Family history of ischemic heart disease and other diseases of the circulatory system: Secondary | ICD-10-CM

## 2015-11-03 DIAGNOSIS — Z823 Family history of stroke: Secondary | ICD-10-CM

## 2015-11-03 DIAGNOSIS — F17210 Nicotine dependence, cigarettes, uncomplicated: Secondary | ICD-10-CM | POA: Diagnosis present

## 2015-11-03 DIAGNOSIS — I509 Heart failure, unspecified: Secondary | ICD-10-CM

## 2015-11-03 DIAGNOSIS — D638 Anemia in other chronic diseases classified elsewhere: Secondary | ICD-10-CM | POA: Diagnosis present

## 2015-11-03 DIAGNOSIS — Z809 Family history of malignant neoplasm, unspecified: Secondary | ICD-10-CM

## 2015-11-03 DIAGNOSIS — E785 Hyperlipidemia, unspecified: Secondary | ICD-10-CM | POA: Diagnosis present

## 2015-11-03 DIAGNOSIS — Z8673 Personal history of transient ischemic attack (TIA), and cerebral infarction without residual deficits: Secondary | ICD-10-CM

## 2015-11-03 DIAGNOSIS — J440 Chronic obstructive pulmonary disease with acute lower respiratory infection: Secondary | ICD-10-CM | POA: Diagnosis present

## 2015-11-03 DIAGNOSIS — J449 Chronic obstructive pulmonary disease, unspecified: Secondary | ICD-10-CM

## 2015-11-03 DIAGNOSIS — E119 Type 2 diabetes mellitus without complications: Secondary | ICD-10-CM

## 2015-11-03 DIAGNOSIS — Z89611 Acquired absence of right leg above knee: Secondary | ICD-10-CM

## 2015-11-03 DIAGNOSIS — I998 Other disorder of circulatory system: Secondary | ICD-10-CM

## 2015-11-03 DIAGNOSIS — T380X5A Adverse effect of glucocorticoids and synthetic analogues, initial encounter: Secondary | ICD-10-CM | POA: Diagnosis present

## 2015-11-03 DIAGNOSIS — J410 Simple chronic bronchitis: Secondary | ICD-10-CM

## 2015-11-03 LAB — GLUCOSE, CAPILLARY
Glucose-Capillary: 95 mg/dL (ref 65–99)
Glucose-Capillary: 83 mg/dL (ref 65–99)

## 2015-11-03 LAB — CBC
HEMATOCRIT: 29.9 % — AB (ref 36.0–46.0)
Hemoglobin: 9 g/dL — ABNORMAL LOW (ref 12.0–15.0)
MCH: 26.1 pg (ref 26.0–34.0)
MCHC: 30.1 g/dL (ref 30.0–36.0)
MCV: 86.7 fL (ref 78.0–100.0)
Platelets: 371 10*3/uL (ref 150–400)
RBC: 3.45 MIL/uL — ABNORMAL LOW (ref 3.87–5.11)
RDW: 18 % — AB (ref 11.5–15.5)
WBC: 8.7 10*3/uL (ref 4.0–10.5)

## 2015-11-03 LAB — BRAIN NATRIURETIC PEPTIDE: B NATRIURETIC PEPTIDE 5: 838 pg/mL — AB (ref 0.0–100.0)

## 2015-11-03 LAB — BASIC METABOLIC PANEL
ANION GAP: 9 (ref 5–15)
BUN: 18 mg/dL (ref 6–20)
CALCIUM: 9.2 mg/dL (ref 8.9–10.3)
CO2: 30 mmol/L (ref 22–32)
Chloride: 104 mmol/L (ref 101–111)
Creatinine, Ser: 0.91 mg/dL (ref 0.44–1.00)
GFR calc Af Amer: >60 mL/min (ref >60)
GFR calc non Af Amer: >60 mL/min (ref >60)
GLUCOSE: 75 mg/dL (ref 65–99)
POTASSIUM: 4.8 mmol/L (ref 3.5–5.1)
Sodium: 143 mmol/L (ref 135–145)

## 2015-11-03 LAB — TROPONIN I: Troponin I: <0.03 ng/mL (ref <0.031)

## 2015-11-03 MED ORDER — IOHEXOL 350 MG/ML SOLN
100.0000 mL | Freq: Once | INTRAVENOUS | Status: AC | PRN
  Administered 2015-11-03: 100 mL via INTRAVENOUS

## 2015-11-03 MED ORDER — FUROSEMIDE 10 MG/ML IJ SOLN
40.0000 mg | Freq: Once | INTRAMUSCULAR | Status: AC
  Administered 2015-11-03: 40 mg via INTRAVENOUS
  Filled 2015-11-03: qty 4

## 2015-11-03 MED ORDER — LISINOPRIL 20 MG PO TABS: 20.0000 mg | ORAL_TABLET | Freq: Every day | ORAL | Status: DC

## 2015-11-03 MED ORDER — ALBUTEROL SULFATE (2.5 MG/3ML) 0.083% IN NEBU
5.0000 mg | INHALATION_SOLUTION | Freq: Once | RESPIRATORY_TRACT | Status: AC
  Administered 2015-11-03: 5 mg via RESPIRATORY_TRACT
  Filled 2015-11-03: qty 6

## 2015-11-03 MED ORDER — FUROSEMIDE 10 MG/ML IJ SOLN
40.0000 mg | Freq: Every day | INTRAMUSCULAR | Status: DC
  Administered 2015-11-04 – 2015-11-09 (×6): 40 mg via INTRAVENOUS
  Filled 2015-11-03 (×6): qty 4

## 2015-11-03 NOTE — Clinical Social Work Note (Signed)
Clinical Social Worker facilitated patient discharge including contacting patient family and facility to confirm patient discharge plans.  Clinical information faxed to facility and family agreeable with plan.  CSW arranged ambulance transport via [facility] to [facility].  RN to call report prior to discharge.  Clinical Social Worker will sign off for now as social work intervention is no longer needed. Please consult [place] again if new need arises.  Glendon Axe, MSW, LCSWA [phone] 11/03/2015 10:57 AM

## 2015-11-03 NOTE — H&P (Signed)
PCP:   Maricela Curet, MD   Chief Complaint:  Sent from [facility] for tachycardia, hypoxia  HPI: 65 yo female with multiple medical issues comes in from [facility] for hypoxia and tachycardia.  Pt just d[facility]c [facility] today after right aka for ischemic limb.  Pt feels fine.  Denies any fevers.  No sob.  No cough.  She is not on oxygen chronically and was not sent to [facility] with oxygen per her report.  Unknown what her oxygen sats were on RA at [facility] or in the ED.  Pt does have some swelling in her left le but not her right stump.  Pt has no complaints.  Referred for admission for chf exac.    Review of Systems:  Positive and negative as per HPI otherwise all other systems are negative  Past Medical History: Past Medical History  Diagnosis Date  . Hypertension   . COPD (chronic obstructive pulmonary disease) ([place])   . Stroke ([place])   . Chronic lower back pain   . Shingles Dec. 2016    Scalp  . Peripheral vascular disease ([place])   . GERD (gastroesophageal reflux disease)   . Hyperlipidemia   . Anxiety   . Type II diabetes mellitus ([place])   . Arthritis     "back" (10/30/2015)   Past Surgical History  Procedure Laterality Date  . Posterior lumbar fusion    . Foot surgery Bilateral     "from standing on concrete all day"  . Peripheral vascular catheterization N/A 04/10/2015    Procedure: Abdominal Aortogram w/Lower Extremity;  Surgeon: Conrad [place], MD;  Location: [place] CV LAB;  Service: Cardiovascular;  Laterality: N/A;  . Thrombectomy femoral artery Right 04/10/2015    Procedure: THROMBECTOMY RIGHT TIBIAL  ARTERY WITH VEIN PATCH ANGIOPLASTY;  Surgeon: Elam Dutch, MD;  Location: [place];  Service: Vascular;  Laterality: Right;  . Fasciotomy Right 04/10/2015    Procedure: FOUR COMPARTMENT FASCIOTOMY;  Surgeon: Elam Dutch, MD;  Location: [facility] OR;  Service: Vascular;  Laterality: Right;  . Femoral-popliteal bypass graft Right 04/10/2015    Procedure: RIGHT   FEMORAL-BELOW THE KNEE POPLITEAL ARTERY BYPASS GRAFT USING 6 MM X 80 CM PROPATEN GRAFT;  Surgeon: Elam Dutch, MD;  Location: [place];  Service: Vascular;  Laterality: Right;  . Intraoperative arteriogram Right 04/10/2015    Procedure: INTRA OPERATIVE ARTERIOGRAM;  Surgeon: Elam Dutch, MD;  Location: [place];  Service: Vascular;  Laterality: Right;  . Carotid endarterectomy Right 2010    Dr Kellie Simmering   . Peripheral vascular catheterization N/A 10/20/2015    Procedure: Abdominal Aortogram;  Surgeon: Elam Dutch, MD;  Location: [place] CV LAB;  Service: Cardiovascular;  Laterality: N/A;  . Laparoscopic cholecystectomy    . Back surgery    . Carotid endarterectomy Left 03/2008    Archie Endo 01/15/2011  . Shoulder open rotator cuff repair Right 02/2006    Archie Endo 01/29/2011  . Cardiac catheterization    . 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 Amputation Right 10/30/2015    Procedure: RIGHT ABOVE KNEE AMPUTATION;  Surgeon: Elam Dutch, MD;  Location: [facility] OR;  Service: Vascular;  Laterality: Right;    Medications: Prior to Admission medications   Medication Sig Start Date End Date Taking? Authorizing Provider  albuterol (PROVENTIL HFA;VENTOLIN HFA) 108 (90 Base) MCG/ACT inhaler Inhale 2 puffs into the lungs every 6 (six) hours as needed for wheezing or shortness of breath.  Yes Historical Provider, MD  ALPRAZolam (XANAX) 0.25 MG tablet Take 0.25 mg by mouth 4 (four) times daily as needed for anxiety.    Yes Historical Provider, MD  aspirin 325 MG tablet Take 1 tablet (325 mg total) by mouth daily. 04/15/15  Yes Thurnell Lose, MD  atorvastatin (LIPITOR) 20 MG tablet Take 1 tablet (20 mg total) by mouth daily at 6 PM. 04/15/15  Yes Thurnell Lose, MD  citalopram (CELEXA) 20 MG tablet Take 20 mg by mouth daily. 07/04/13  Yes Historical Provider, MD  dextromethorphan-guaiFENesin (MUCINEX DM) 30-600 MG 12hr tablet Take 1 tablet by mouth 2 (two) times daily as needed for cough.    Yes  Historical Provider, MD  gabapentin (NEURONTIN) 300 MG capsule Take 300 mg by mouth 3 (three) times daily.   Yes Historical Provider, MD  Insulin Glargine (LANTUS SOLOSTAR) 100 UNIT/ML Solostar Pen Inject 34 Units into the skin at bedtime.   Yes Historical Provider, MD  ipratropium-albuterol (DUONEB) 0.5-2.5 (3) MG/3ML SOLN Take 3 mLs by nebulization every 8 (eight) hours.    Yes Historical Provider, MD  lisinopril (PRINIVIL,ZESTRIL) 20 MG tablet Take 1 tablet (20 mg total) by mouth daily. 11/04/15  Yes Alvia Grove, PA-C  metFORMIN (GLUCOPHAGE) 500 MG tablet Take 500 mg by mouth 2 (two) times daily with a meal.   Yes Historical Provider, MD  metoprolol tartrate (LOPRESSOR) 25 MG tablet Take 25 mg by mouth 2 (two) times daily.    Yes Historical Provider, MD  oxyCODONE-acetaminophen (PERCOCET) 10-325 MG tablet Take 1 tablet by mouth every 8 (eight) hours. 11/01/15  Yes Kimberly A Trinh, PA-C  pantoprazole (PROTONIX) 40 MG tablet Take 40 mg by mouth daily.   Yes Historical Provider, MD  naproxen (NAPROSYN) 500 MG tablet Take 500 mg by mouth 2 (two) times daily as needed for mild pain or moderate pain.  03/16/15   Historical Provider, MD  varenicline (CHANTIX) 0.5 MG tablet Take 2 tablets (1 mg total) by mouth 2 (two) times daily. Patient not taking: Reported on 11/03/2015 08/23/15   Lucia Gaskins, MD    Allergies:   Allergies  Allergen Reactions  . Codeine Shortness Of Breath  . Penicillins Other (See Comments)    Has patient had a PCN reaction causing immediate rash, facial/tongue/throat swelling, SOB or lightheadedness with hypotension: unknown Has patient had a PCN reaction causing severe rash involving mucus membranes or skin necrosis: unknown Has patient had a PCN reaction that required hospitalization unknown Has patient had a PCN reaction occurring within the last 10 years: unknown If all of the above answers are "NO", then may proceed with Cephalosporin use.  . Ciprofloxacin Hives     Social History:  reports that she has been smoking Cigarettes.  She has a 5.88 pack-year smoking history. She has never used smokeless tobacco. She reports that she does not drink alcohol or use illicit drugs.  Family History: Family History  Problem Relation Age of Onset  . Cancer Father   . Heart attack Sister   . Stroke Sister   . Hypertension Sister   . Hypertension Brother     Physical Exam: Filed Vitals:   11/03/15 1930 11/03/15 2000 11/03/15 2030 11/03/15 2100  BP: 128/78 137/77 151/77 162/87  Pulse: 119 120 118 120  Temp:      TempSrc:      Resp: 13 14 19 18   SpO2: 98% 93% 95% 96%   General appearance: alert, cooperative and no distress Head: Normocephalic, without  obvious abnormality, atraumatic Eyes: negative Nose: Nares normal. Septum midline. Mucosa normal. No drainage or sinus tenderness. Neck: no JVD and supple, symmetrical, trachea midline Lungs: clear to auscultation bilaterally Heart: regular rate and rhythm, S1, S2 normal, no murmur, click, rub or gallop Abdomen: soft, non-tender; bowel sounds normal; no masses,  no organomegaly Extremities: edema 1+ lle, right aka stump incision looks good with staples, no swelling Pulses: 2+ and symmetric Skin: Skin color, texture, turgor normal. No rashes or lesions Neurologic: Grossly normal    Labs on Admission:   Recent Labs  11/02/15 0350 11/03/15 0400  NA 139 143  K 4.9 4.8  CL 102 104  CO2 25 30  GLUCOSE 109* 75  BUN 26* 18  CREATININE 1.47* 0.91  CALCIUM 9.0 9.2    Recent Labs  11/02/15 0350 11/03/15 0400  WBC 11.0* 8.7  HGB 8.9* 9.0*  HCT 30.7* 29.9*  MCV 86.0 86.7  PLT 449* 371    Recent Labs  11/03/15 1851  TROPONINI <0.03   Radiological Exams on Admission: Dg Chest 2 View  11/03/2015  CLINICAL DATA:  SOB, sick, faint episode earlier today, pt was transferred to the [facility] today., pt had amputation of leg on Monday 10/30/15 EXAM: CHEST  2 VIEW COMPARISON:  A 08/17/2015  FINDINGS: Cardiac silhouette is mildly enlarged. No mediastinal or hilar masses or evidence of adenopathy. The thickening of the interstitial markings most the lung bases, stable prior study. Small bilateral pleural effusions are also stable. No new lung opacities. No pneumothorax the bony thorax is demineralized grossly intact. IMPRESSION: 1. Stable appearance the prior study. 2. Mild cardiomegaly, interstitial thickening and small effusions. Mild chronic congestive heart failure suspected. Electronically Signed   By: Lajean Manes M.D.   On: 11/03/2015 18:40   Old chart reviewed ekg reviewed sinus tach no acute issues cxr with edema  Assessment/Plan  65 yo female with tachycardia, hypoxia with recent right bka with some fluid overload  Principal Problem:   CHF exacerbation (HCC) vs fluid overload from recent hospitalization- her tachycardia is chronic and her bblocker looks like was just restarted today.  Have requested CTA to r/o PE however to make sure she does not have underlying PE.  She is a little fluid overloaded.  Place on lasix iv , not on this chronically.  Had recent AKI, noted.  Echo in dec 16 reviewed, EF 50%.  Will not repeat echo.  Active Problems:   Tachycardia- bblocker started back today in hospital prior d/c.  Afebrile.  Check for PE   COPD (chronic obstructive pulmonary disease) (HCC)- lungs clear   Essential hypertension, benign- noted   Diabetes ([place])- ssi   S/P AKA (above knee amputation) unilateral ([place])- stump looks good, not infected or swollen   PVD (peripheral vascular disease) ([place])- noted   Hypoxia-  Provide 2 liters [place], wean as tolerated  obs on tele.  Full code.  DAVID,RACHAL A 11/03/2015, 9:19 PM

## 2015-11-03 NOTE — ED Notes (Signed)
Patient transported to CT 

## 2015-11-03 NOTE — ED Notes (Signed)
Admitting MD at bedside.

## 2015-11-03 NOTE — ED Provider Notes (Signed)
CSN: [id number]     Arrival date & time 11/03/15  12 History   First MD Initiated Contact with Patient 11/03/15 1733     Chief Complaint  Patient presents with  . Tachycardia      The history is provided by the patient.   patient was sent in from [facility] center with hypoxia and tachycardia. Discharged from [place] this afternoon. At that time had tachycardia. She's had apparent tachycardia chronically. Does have some shortness of breath. Reportedly has oxygen saturations that went down to 80s on room air. She's been on oxygen for at least the last couple days. Has history of COPD and has oxygen at home. She is not always on up takes when needed. No chest pain. She had a right above-the-knee amputation. She was recently started back on her metoprolol. She did have some problems with hypotension while in the hospital. Denies fevers or chills. Just chest pain. States she does feel weak all over.  Past Medical History  Diagnosis Date  . Hypertension   . COPD (chronic obstructive pulmonary disease) ([place])   . Stroke ([place])   . Chronic lower back pain   . Shingles Dec. 2016    Scalp  . Peripheral vascular disease ([place])   . GERD (gastroesophageal reflux disease)   . Hyperlipidemia   . Anxiety   . Type II diabetes mellitus ([place])   . Arthritis     "back" (10/30/2015)   Past Surgical History  Procedure Laterality Date  . Posterior lumbar fusion    . Foot surgery Bilateral     "from standing on concrete all day"  . Peripheral vascular catheterization N/A 04/10/2015    Procedure: Abdominal Aortogram w/Lower Extremity;  Surgeon: Conrad Oglethorpe, MD;  Location: [place] [facility];  Service: Cardiovascular;  Laterality: N/A;  . Thrombectomy femoral artery Right 04/10/2015    Procedure: THROMBECTOMY RIGHT TIBIAL  ARTERY WITH VEIN PATCH ANGIOPLASTY;  Surgeon: Elam Dutch, MD;  Location: [place];  Service: Vascular;  Laterality: Right;  . Fasciotomy Right 04/10/2015    Procedure: FOUR COMPARTMENT  FASCIOTOMY;  Surgeon: Elam Dutch, MD;  Location[facility] OR;  Service: Vascular;  Laterality: Right;  . Femoral-popliteal bypass graft Right 04/10/2015    Procedure: RIGHT  FEMORAL-BELOW THE KNEE POPLITEAL ARTERY BYPASS GRAFT USING 6 MM X 80 CM PROPATEN GRAFT;  Surgeon: Elam Dutch, MD;  Location: [place];  Service: Vascular;  Laterality: Right;  . Intraoperative arteriogram Right 04/10/2015    Procedure: INTRA OPERATIVE ARTERIOGRAM;  Surgeon: Elam Dutch, MD;  Location: [place];  Service: Vascular;  Laterality: Right;  . Carotid endarterectomy Right 2010    Dr Kellie Simmering   . Peripheral vascular catheterization N/A 10/20/2015    Procedure: Abdominal Aortogram;  Surgeon: Elam Dutch, MD;  Location: [place] [facility];  Service: Cardiovascular;  Laterality: N/A;  . Laparoscopic cholecystectomy    . Back surgery    . Carotid endarterectomy Left 03/2008    Archie Endo 01/15/2011  . Shoulder open rotator cuff repair Right 02/2006    Archie Endo 01/29/2011  . Cardiac catheterization    . 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 Amputation Right 10/30/2015    Procedure: RIGHT ABOVE KNEE AMPUTATION;  Surgeon: Elam Dutch, MD;  Location[facility];  Service: Vascular;  Laterality: Right;   Family History  Problem Relation Age of Onset  . Cancer Father   . Heart attack Sister   . Stroke Sister   .  Hypertension Sister   . Hypertension Brother    Social History  Substance Use Topics  . Smoking status: Current Every Day Smoker -- 0.12 packs/day for 49 years    Types: Cigarettes  . Smokeless tobacco: Never Used  . Alcohol Use: No   OB History    Gravida Para Term Preterm AB TAB SAB Ectopic Multiple Living   3 3 3       3      Review of Systems  Constitutional: Positive for fatigue. Negative for appetite change.  Respiratory: Positive for cough. Negative for shortness of breath.   Cardiovascular: Negative for chest pain.  Gastrointestinal: Negative for abdominal pain.  Genitourinary: Negative  for dysuria.  Musculoskeletal: Negative for back pain.  Skin: Negative for wound.      Allergies  Codeine; Penicillins; and Ciprofloxacin  Home Medications   Prior to Admission medications   Medication Sig Start Date End Date Taking? Authorizing Provider  albuterol (PROVENTIL HFA;VENTOLIN HFA) 108 (90 Base) MCG/ACT inhaler Inhale 2 puffs into the lungs every 6 (six) hours as needed for wheezing or shortness of breath.   Yes Historical Provider, MD  ALPRAZolam (XANAX) 0.25 MG tablet Take 0.25 mg by mouth 4 (four) times daily as needed for anxiety.    Yes Historical Provider, MD  aspirin 325 MG tablet Take 1 tablet (325 mg total) by mouth daily. 04/15/15  Yes Thurnell Lose, MD  atorvastatin (LIPITOR) 20 MG tablet Take 1 tablet (20 mg total) by mouth daily at 6 PM. 04/15/15  Yes Thurnell Lose, MD  citalopram (CELEXA) 20 MG tablet Take 20 mg by mouth daily. 07/04/13  Yes Historical Provider, MD  dextromethorphan-guaiFENesin (MUCINEX DM) 30-600 MG 12hr tablet Take 1 tablet by mouth 2 (two) times daily as needed for cough.    Yes Historical Provider, MD  gabapentin (NEURONTIN) 300 MG capsule Take 300 mg by mouth 3 (three) times daily.   Yes Historical Provider, MD  Insulin Glargine (LANTUS SOLOSTAR) 100 UNIT/ML Solostar Pen Inject 34 Units into the skin at bedtime.   Yes Historical Provider, MD  ipratropium-albuterol (DUONEB) 0.5-2.5 (3) MG/3ML SOLN Take 3 mLs by nebulization every 8 (eight) hours.    Yes Historical Provider, MD  lisinopril (PRINIVIL,ZESTRIL) 20 MG tablet Take 1 tablet (20 mg total) by mouth daily. 11/04/15  Yes Alvia Grove, PA-C  metFORMIN (GLUCOPHAGE) 500 MG tablet Take 500 mg by mouth 2 (two) times daily with a meal.   Yes Historical Provider, MD  metoprolol tartrate (LOPRESSOR) 25 MG tablet Take 25 mg by mouth 2 (two) times daily.    Yes Historical Provider, MD  oxyCODONE-acetaminophen (PERCOCET) 10-325 MG tablet Take 1 tablet by mouth every 8 (eight) hours. 11/01/15   Yes Kimberly A Trinh, PA-C  pantoprazole (PROTONIX) 40 MG tablet Take 40 mg by mouth daily.   Yes Historical Provider, MD  naproxen (NAPROSYN) 500 MG tablet Take 500 mg by mouth 2 (two) times daily as needed for mild pain or moderate pain.  03/16/15   Historical Provider, MD  varenicline (CHANTIX) 0.5 MG tablet Take 2 tablets (1 mg total) by mouth 2 (two) times daily. Patient not taking: Reported on 11/03/2015 08/23/15   Lucia Gaskins, MD   BP 139/77 mmHg  Pulse 120  Temp(Src) 97.9 F (36.6 C) (Oral)  Resp 10  SpO2 96% Physical Exam  Constitutional: She appears well-developed.  HENT:  Head: Atraumatic.  Eyes: EOM are normal.  Neck: Neck supple.  Cardiovascular:  Regular tachycardia  Pulmonary/Chest:  Effort normal.  Diffuse wheezes and mild prolonged expiration.  Abdominal: Soft. There is no tenderness.  Musculoskeletal:  Right above-the-knee amputation.  Skin: Skin is warm. No erythema.    ED Course  Procedures (including critical care time) Labs Review Labs Reviewed  BRAIN NATRIURETIC PEPTIDE - Abnormal; Notable for the following:    B Natriuretic Peptide 838.0 (*)    All other components within normal limits  TROPONIN I    Imaging Review Dg Chest 2 View  11/03/2015  CLINICAL DATA:  SOB, sick, faint episode earlier today, pt was transferred to the [facility] today., pt had amputation of leg on Monday 10/30/15 EXAM: CHEST  2 VIEW COMPARISON:  A 08/17/2015 FINDINGS: Cardiac silhouette is mildly enlarged. No mediastinal or hilar masses or evidence of adenopathy. The thickening of the interstitial markings most the lung bases, stable prior study. Small bilateral pleural effusions are also stable. No new lung opacities. No pneumothorax the bony thorax is demineralized grossly intact. IMPRESSION: 1. Stable appearance the prior study. 2. Mild cardiomegaly, interstitial thickening and small effusions. Mild chronic congestive heart failure suspected. Electronically Signed   By: Lajean Manes M.D.   On: 11/03/2015 18:40   Ct Angio Chest Pe W/cm &/or Wo Cm  11/03/2015  CLINICAL DATA:  Sharp, intermittent low back pain. Wheezing. Shortness of breath. EXAM: CT ANGIOGRAPHY CHEST WITH CONTRAST TECHNIQUE: Multidetector CT imaging of the chest was performed using the standard protocol during bolus administration of intravenous contrast. Multiplanar CT image reconstructions and MIPs were obtained to evaluate the vascular anatomy. CONTRAST:  163mL OMNIPAQUE IOHEXOL 350 MG/ML SOLN COMPARISON:  Chest radiographs obtained earlier today. FINDINGS: Mediastinum/Lymph Nodes: Mildly enlarged AP window lymph node with a short axis diameter of 12 mm on image number 34 of series 4. Enlarged subcarinal node with a short axis diameter of 15 mm on image number 54. Enlarged left hilar node with a short axis diameter of 12 mm on image number 53. No axillary or upper abdominal adenopathy. Small paraesophageal varices. Normally opacified pulmonary arteries with no pulmonary arterial filling defects. No aortic aneurysm or dissection. Lungs/Pleura: Small to moderate-sized bilateral pleural effusions. Bilateral lower lobe compressive atelectasis. 11 mm sub solid nodule in the inferior right upper lobe on image number 43. This appears more linear in the coronal and sagittal planes. 7 mm sub solid nodule in the inferior, posterior aspect of the left upper lobe on image number 49. Diffuse peribronchial thickening and prominence of the interstitial markings. Upper abdomen: Prominent lateral segment of the left lobe of the liver and prominent caudate lobe of the liver. The liver contours are mildly lobulated. Musculoskeletal: Thoracic spine degenerative changes and changes of DISH. Review of the MIP images confirms the above findings. IMPRESSION: 1. No pulmonary emboli. 2. Small to moderate-sized bilateral pleural effusions with bilateral lower lobe atelectasis. 3. Probable combination of chronic interstitial lung disease and  mild interstitial pulmonary edema. 4. Single subsolid nodule in each upper lobe, as described above. These could represent small foci of the infection or scarring. Neoplastic processes are less likely. Recommend a followup chest CT with contrast in 6-12 months. 5. Possible changes of cirrhosis of the liver with small esophageal varices. 6. Probable reactive mediastinal and left hilar adenopathy. Electronically Signed   By: Claudie Revering M.D.   On: 11/03/2015 22:21   I have personally reviewed and evaluated these images and lab results as part of my medical decision-making.   EKG Interpretation   Date/Time:  Friday November 03 2015 18:03:12 EST Ventricular Rate:  115 PR Interval:    QRS Duration: 116 QT Interval:  320 QTC Calculation: 443 R Axis:   11 Text Interpretation:  Junctional tachycardia Nonspecific intraventricular  conduction delay No significant change since last tracing Confirmed by  Alvino Chapel  MD, Ovid Curd [phone]) on 11/03/2015 6:07:41 PM      MDM   Final diagnoses:  Congestive heart failure, unspecified congestive heart failure chronicity, unspecified congestive heart failure type (HCC)  Chronic obstructive pulmonary disease, unspecified COPD type ([place])  Pleural effusion    Patient with shortness of breath. Discharge from hospital today and sent to [facility] center. Found to be hypoxic there. Reportedly has episodes of hypoxia and has had oxygen in the past. During her surgery and postoperative. She had some hypotension was given IV fluids. Has history of tachycardia seen cardiology for it. She has been on metoprolol for it. Did have worsening hypoxia. She sounds wheezy but also has elevated BNP and x-ray that appears to be CHF. Will admit to internal medicine. Discussed with hospitalist the recommended CT angiography that showed pleural effusions but no pulmonary embolism. Admit to internal medicine.    Davonna Belling, MD 11/03/15 2233

## 2015-11-03 NOTE — Clinical Social Work Placement (Signed)
   CLINICAL SOCIAL WORK PLACEMENT  NOTE  Date:  11/03/2015  Patient Details  Name: [name] MRN: [id number] Date of Birth: Jun 07, 1951  Clinical Social Work is seeking post-discharge placement for this patient at the [place] level of care (*CSW will initial, date and re-position this form in  chart as items are completed):  Yes   Patient/family provided with [place] Work Department's list of facilities offering this level of care within the geographic area requested by the patient (or if unable, by the patient's family).  Yes   Patient/family informed of their freedom to choose among providers that offer the needed level of care, that participate in Medicare, Medicaid or managed care program needed by the patient, have an available bed and are willing to accept the patient.  Yes   Patient/family informed of [place]'s ownership interest in [facility] and [facility], as well as of the fact that they are under no obligation to receive care at these facilities.  PASRR submitted to EDS on 10/31/15     PASRR number received on 10/31/15     Existing PASRR number confirmed on       FL2 transmitted to all facilities in geographic area requested by pt/family on 10/31/15     FL2 transmitted to all facilities within larger geographic area on       Patient informed that his/her managed care company has contracts with or will negotiate with certain facilities, including the following:        Yes   Patient/family informed of bed offers received.  Patient chooses bed at [facility]     Physician recommends and patient chooses bed at      Patient to be transferred to [facility] on  .  Patient to be transferred to facility by  Corey Harold )     Patient family notified on 11/03/15 of transfer.  Name of family member notified:   (Pt's husband, [name] )     PHYSICIAN Please prepare priority discharge summary, including  medications, Please sign FL2     Additional Comment:    _______________________________________________ Rozell Searing, LCSW 11/03/2015, 10:57 AM

## 2015-11-03 NOTE — Care Management Note (Signed)
Case Management Note Marvetta Gibbons RN, BSN Unit 2W-Case Manager [phone]  Patient Details  Name: [name] MRN: [id number] Date of Birth: 1951-01-23  Subjective/Objective:     Pt admitted s/p AKA               Action/Plan: PTA pt lived at home with family- plan is for STSNF for rehab- CSW following for placement needs  Expected Discharge Date:    11/03/15             Expected Discharge Plan:  Skilled Nursing Facility  In-House Referral:  Clinical Social Work  Discharge planning Services  CM Consult  Post Acute Care Choice:    Choice offered to:     DME Arranged:    DME Agency:     HH Arranged:    [place] Agency:     Status of Service:  Completed, signed off  Medicare Important Message Given:  Yes Date Medicare IM Given:    Medicare IM give by:    Date Additional Medicare IM Given:    Additional Medicare Important Message give by:     If discussed at [place] of Stay Meetings, dates discussed:    Additional Comments:  Dawayne Patricia, RN 11/03/2015, 4:32 PM

## 2015-11-03 NOTE — ED Notes (Signed)
Pt here from [facility] for evaluation of increased heart rate and low 02 level ( 88 % ) per staff. Pt was just [facility] Pt had a right AKA on Monday

## 2015-11-03 NOTE — Progress Notes (Addendum)
  [facility] Progress Note  Subjective  - POD #4  No complaints. Denies shortness of breath and chest discomfort.   Objective Filed Vitals:   11/02/15 2050 11/03/15 0400  BP: 120/57 117/64  Pulse: 121 119  Temp: 98.4 F (36.9 C) 98.1 F (36.7 C)  Resp: 16 18   Tmax 99.5 BP 117/64 HR 110s 02 97% 2L   Intake/Output Summary (Last 24 hours) at 11/03/15 0736 Last data filed at 11/03/15 0400  Gross per 24 hour  Intake    960 ml  Output   3150 ml  Net  -2190 ml   Right AKA stapled incision clean and intact.   Assessment/Planning: 65 y.o. female is s/p: right above-knee amputation 4 Days Post-Op   Amputation healing well.  AKI secondary to dehydration: resolved. Creatinine 0.91 today.  Hypotension: BP stable this am systolic 0000000.  Tachycardia: asymptomatic, resume metoprolol today. Continue to hold lisinopril.  Discharge to SNF today. Will d/w Dr. Oneida Alar.  CSW please discuss placement options with patient.   Alvia Grove 11/03/2015 7:36 AM --  Laboratory CBC    Component Value Date/Time   WBC 8.7 11/03/2015 0400   HGB 9.0* 11/03/2015 0400   HCT 29.9* 11/03/2015 0400   PLT 371 11/03/2015 0400    BMET    Component Value Date/Time   NA 143 11/03/2015 0400   K 4.8 11/03/2015 0400   CL 104 11/03/2015 0400   CO2 30 11/03/2015 0400   GLUCOSE 75 11/03/2015 0400   BUN 18 11/03/2015 0400   CREATININE 0.91 11/03/2015 0400   CALCIUM 9.2 11/03/2015 0400   GFRNONAA >60 11/03/2015 0400   GFRAA >60 11/03/2015 0400    COAG Lab Results  Component Value Date   INR 0.9 11/09/2008   INR 1.0 04/12/2008   INR 0.9 04/11/2008   No results found for: PTT  Antibiotics Anti-infectives    Start     Dose/Rate Route Frequency Ordered Stop   10/30/15 2300  vancomycin (VANCOCIN) IVPB 1000 mg/200 mL premix     1,000 mg 200 mL/hr over 60 Minutes Intravenous Every 12 hours 10/30/15 2006 10/31/15 1218   10/30/15 1100  vancomycin (VANCOCIN) IVPB 1000  mg/200 mL premix     1,000 mg 200 mL/hr over 60 Minutes Intravenous To ShortStay [facility] 10/27/15 0819 10/30/15 1351       Virgina Jock, PA-C [facility] Office: [phone] Pager: [phone] 11/03/2015 7:36 AM

## 2015-11-03 NOTE — ED Notes (Signed)
MD at bedside. 

## 2015-11-03 NOTE — Progress Notes (Signed)
Pt has been discharged to [facility]. Pt's IV and telemetry box were removed. Pt was weaned off of oxygen prior to discharge. Pt was discharged via stretcher and was accompanied by EMS transport staff. Pt's belongings, including clothes, shoes, phone, and phone charger were sent with her. Pt was in no distress at time of discharge. Report was called to [place] at [facility] and all questions were answered.   Grant Fontana RN, BSN

## 2015-11-03 NOTE — ED Notes (Signed)
RT aware of breathing tx.

## 2015-11-04 ENCOUNTER — Encounter (HOSPITAL_COMMUNITY): Payer: Self-pay | Admitting: *Deleted

## 2015-11-04 DIAGNOSIS — K219 Gastro-esophageal reflux disease without esophagitis: Secondary | ICD-10-CM | POA: Diagnosis present

## 2015-11-04 DIAGNOSIS — Z809 Family history of malignant neoplasm, unspecified: Secondary | ICD-10-CM | POA: Diagnosis not present

## 2015-11-04 DIAGNOSIS — Z794 Long term (current) use of insulin: Secondary | ICD-10-CM | POA: Diagnosis not present

## 2015-11-04 DIAGNOSIS — I5022 Chronic systolic (congestive) heart failure: Secondary | ICD-10-CM | POA: Diagnosis present

## 2015-11-04 DIAGNOSIS — Z89611 Acquired absence of right leg above knee: Secondary | ICD-10-CM | POA: Diagnosis not present

## 2015-11-04 DIAGNOSIS — Z823 Family history of stroke: Secondary | ICD-10-CM | POA: Diagnosis not present

## 2015-11-04 DIAGNOSIS — T380X5A Adverse effect of glucocorticoids and synthetic analogues, initial encounter: Secondary | ICD-10-CM | POA: Diagnosis present

## 2015-11-04 DIAGNOSIS — J449 Chronic obstructive pulmonary disease, unspecified: Secondary | ICD-10-CM | POA: Diagnosis present

## 2015-11-04 DIAGNOSIS — E1165 Type 2 diabetes mellitus with hyperglycemia: Secondary | ICD-10-CM | POA: Diagnosis present

## 2015-11-04 DIAGNOSIS — F17210 Nicotine dependence, cigarettes, uncomplicated: Secondary | ICD-10-CM | POA: Diagnosis present

## 2015-11-04 DIAGNOSIS — I739 Peripheral vascular disease, unspecified: Secondary | ICD-10-CM | POA: Diagnosis present

## 2015-11-04 DIAGNOSIS — E785 Hyperlipidemia, unspecified: Secondary | ICD-10-CM | POA: Diagnosis present

## 2015-11-04 DIAGNOSIS — R0902 Hypoxemia: Secondary | ICD-10-CM | POA: Diagnosis present

## 2015-11-04 DIAGNOSIS — D638 Anemia in other chronic diseases classified elsewhere: Secondary | ICD-10-CM | POA: Diagnosis present

## 2015-11-04 DIAGNOSIS — R0602 Shortness of breath: Secondary | ICD-10-CM | POA: Diagnosis present

## 2015-11-04 DIAGNOSIS — I110 Hypertensive heart disease with heart failure: Secondary | ICD-10-CM | POA: Diagnosis present

## 2015-11-04 DIAGNOSIS — Z8249 Family history of ischemic heart disease and other diseases of the circulatory system: Secondary | ICD-10-CM | POA: Diagnosis not present

## 2015-11-04 DIAGNOSIS — J440 Chronic obstructive pulmonary disease with acute lower respiratory infection: Secondary | ICD-10-CM | POA: Diagnosis present

## 2015-11-04 DIAGNOSIS — Z8673 Personal history of transient ischemic attack (TIA), and cerebral infarction without residual deficits: Secondary | ICD-10-CM | POA: Diagnosis not present

## 2015-11-04 DIAGNOSIS — J209 Acute bronchitis, unspecified: Secondary | ICD-10-CM | POA: Diagnosis present

## 2015-11-04 LAB — FERRITIN: FERRITIN: 187 ng/mL (ref 11–307)

## 2015-11-04 LAB — GLUCOSE, CAPILLARY
GLUCOSE-CAPILLARY: 79 mg/dL (ref 65–99)
GLUCOSE-CAPILLARY: 114 mg/dL — AB (ref 65–99)
GLUCOSE-CAPILLARY: 175 mg/dL — AB (ref 65–99)
GLUCOSE-CAPILLARY: 336 mg/dL — AB (ref 65–99)

## 2015-11-04 LAB — FOLATE: Folate: 14.3 ng/mL (ref >5.9)

## 2015-11-04 LAB — IRON AND TIBC
Iron: 23 ug/dL — ABNORMAL LOW (ref 28–170)
Saturation Ratios: 9 % — ABNORMAL LOW (ref 10.4–31.8)
TIBC: 269 ug/dL (ref 250–450)
UIBC: 246 ug/dL

## 2015-11-04 LAB — RETICULOCYTES
RBC.: 3.78 MIL/uL — AB (ref 3.87–5.11)
RETIC CT PCT: 2.1 % (ref 0.4–3.1)
Retic Count, Absolute: 79.4 10*3/uL (ref 19.0–186.0)

## 2015-11-04 LAB — HEPATIC FUNCTION PANEL
ALBUMIN: 2.9 g/dL — AB (ref 3.5–5.0)
ALK PHOS: 79 U/L (ref 38–126)
ALT: 15 U/L (ref 14–54)
AST: 21 U/L (ref 15–41)
BILIRUBIN INDIRECT: 0.5 mg/dL (ref 0.3–0.9)
Bilirubin, Direct: 0.2 mg/dL (ref 0.1–0.5)
TOTAL PROTEIN: 6.5 g/dL (ref 6.5–8.1)
Total Bilirubin: 0.7 mg/dL (ref 0.3–1.2)

## 2015-11-04 LAB — MRSA PCR SCREENING: MRSA BY PCR: NEGATIVE

## 2015-11-04 LAB — VITAMIN B12: VITAMIN B 12: 591 pg/mL (ref 180–914)

## 2015-11-04 MED ORDER — METHYLPREDNISOLONE SODIUM SUCC 125 MG IJ SOLR
125.0000 mg | Freq: Three times a day (TID) | INTRAMUSCULAR | Status: DC
  Administered 2015-11-04 – 2015-11-06 (×7): 125 mg via INTRAVENOUS
  Filled 2015-11-04 (×7): qty 2

## 2015-11-04 MED ORDER — INSULIN ASPART 100 UNIT/ML ~~LOC~~ SOLN
0.0000 [IU] | Freq: Three times a day (TID) | SUBCUTANEOUS | Status: DC
  Administered 2015-11-04: 2 [IU] via SUBCUTANEOUS
  Administered 2015-11-05: 7 [IU] via SUBCUTANEOUS
  Administered 2015-11-05: 9 [IU] via SUBCUTANEOUS
  Administered 2015-11-05: 5 [IU] via SUBCUTANEOUS
  Administered 2015-11-06: 3 [IU] via SUBCUTANEOUS
  Administered 2015-11-06: 9 [IU] via SUBCUTANEOUS
  Administered 2015-11-07 (×3): 7 [IU] via SUBCUTANEOUS

## 2015-11-04 MED ORDER — ALBUTEROL SULFATE (2.5 MG/3ML) 0.083% IN NEBU: 2.5000 mg | INHALATION_SOLUTION | Freq: Four times a day (QID) | RESPIRATORY_TRACT | Status: DC | PRN

## 2015-11-04 MED ORDER — ONDANSETRON HCL 4 MG/2ML IJ SOLN
4.0000 mg | Freq: Once | INTRAMUSCULAR | Status: AC
  Administered 2015-11-04: 4 mg via INTRAVENOUS
  Filled 2015-11-04: qty 2

## 2015-11-04 MED ORDER — OXYCODONE HCL 5 MG PO TABS
10.0000 mg | ORAL_TABLET | ORAL | Status: DC | PRN
  Administered 2015-11-04 – 2015-11-09 (×14): 10 mg via ORAL
  Filled 2015-11-04 (×15): qty 2

## 2015-11-04 MED ORDER — OXYCODONE-ACETAMINOPHEN 5-325 MG PO TABS
1.0000 | ORAL_TABLET | Freq: Once | ORAL | Status: AC
  Administered 2015-11-04: 1 via ORAL
  Filled 2015-11-04: qty 1

## 2015-11-04 MED ORDER — ENOXAPARIN SODIUM 40 MG/0.4ML ~~LOC~~ SOLN
40.0000 mg | SUBCUTANEOUS | Status: DC
  Administered 2015-11-04 – 2015-11-09 (×6): 40 mg via SUBCUTANEOUS
  Filled 2015-11-04 (×6): qty 0.4

## 2015-11-04 MED ORDER — CETYLPYRIDINIUM CHLORIDE 0.05 % MT LIQD
7.0000 mL | Freq: Two times a day (BID) | OROMUCOSAL | Status: DC
  Administered 2015-11-04 – 2015-11-09 (×12): 7 mL via OROMUCOSAL

## 2015-11-04 NOTE — Progress Notes (Signed)
ANTICOAGULATION CONSULT NOTE - Initial Consult  Pharmacy Consult for Lovenox Indication: VTE prophylaxis  Allergies  Allergen Reactions  . Codeine Shortness Of Breath  . Penicillins Other (See Comments)    Has patient had a PCN reaction causing immediate rash, facial/tongue/throat swelling, SOB or lightheadedness with hypotension: unknown Has patient had a PCN reaction causing severe rash involving mucus membranes or skin necrosis: unknown Has patient had a PCN reaction that required hospitalization unknown Has patient had a PCN reaction occurring within the last 10 years: unknown If all of the above answers are "NO", then may proceed with Cephalosporin use.  . Ciprofloxacin Hives    Patient Measurements:   Heparin Dosing Weight:   Vital Signs: Temp: 98.5 F (36.9 C) (02/18 0100) Temp Source: Oral (02/18 0100) BP: 138/68 mmHg (02/18 0100) Pulse Rate: 126 (02/18 0030)  Labs:  Recent Labs  11/02/15 0350 11/03/15 0400 11/03/15 1851  HGB 8.9* 9.0*  --   HCT 30.7* 29.9*  --   PLT 449* 371  --   CREATININE 1.47* 0.91  --   TROPONINI  --   --  <0.03    Estimated Creatinine Clearance: 64.3 mL/min (by C-G formula based on Cr of 0.91).   Medical History: Past Medical History  Diagnosis Date  . Hypertension   . COPD (chronic obstructive pulmonary disease) ([place])   . Stroke ([place])   . Chronic lower back pain   . Shingles Dec. 2016    Scalp  . Peripheral vascular disease ([place])   . GERD (gastroesophageal reflux disease)   . Hyperlipidemia   . Anxiety   . Type II diabetes mellitus ([place])   . Arthritis     "back" (10/30/2015)    Medications:  Scheduled:  . antiseptic oral rinse  7 mL Mouth Rinse BID  . enoxaparin (LOVENOX) injection  40 mg Subcutaneous Q24H  . furosemide  40 mg Intravenous Daily  . insulin aspart  0-9 Units Subcutaneous TID WC  . methylPREDNISolone sodium succinate  125 mg Intravenous 3 times per day    Assessment: 65 yo female, right AKA for  peripheral arterial disease 5 days ago transferred to skilled nursing facility. Episode of dyspnea last evening. CT negative for PE.  CrCl 64.3 ml/min  Goal of Therapy:  DVT prophylaxis dosing Lovenox Monitor platelets by anticoagulation protocol: Yes   Plan:  Lovenox 40 mg SQ every 24 hours Monitor renal function Monitor CBC, platelets  [name] 11/04/2015,9:41 AM

## 2015-11-04 NOTE — Progress Notes (Signed)
Patient had right AKA for peripheral arterial disease 5 days ago transferred to skilled nursing facility had an episode of dyspnea last night chest x-ray feels no infiltrate she quit smoking 5 weeks ago CT angios negative for PE she has mild to moderate bronchospastic disease active at present we'll treat with steroids and nebulizer and ask physical therapy for consult for strengthening ambulation and transfer to chair [name] [id number] DOB: [birth year] DOA: 11/03/2015 PCP: Maricela Curet, MD             Physical Exam: Blood pressure 138/68, pulse 126, temperature 98.5 F (36.9 C), temperature source Oral, resp. rate 16, SpO2 94 %. lungs show mild to moderate intrauterine is very wheezes bilaterally scattered rhonchi no rales appreciable heart regular rhythm no S3-S4 no heaves thrills rubs abdomen soft nontender bowel sounds normoactive   Investigations:  Recent Results (from the past 240 hour(s))  MRSA PCR Screening     Status: None   Collection Time: 11/04/15  4:25 AM  Result Value Ref Range Status   MRSA by PCR NEGATIVE NEGATIVE Final    Comment:        The GeneXpert MRSA Assay (FDA approved for NASAL specimens only), is one component of a comprehensive MRSA colonization surveillance program. It is not intended to diagnose MRSA infection nor to guide or monitor treatment for MRSA infections.      Basic Metabolic Panel:  Recent Labs  11/02/15 0350 11/03/15 0400  NA 139 143  K 4.9 4.8  CL 102 104  CO2 25 30  GLUCOSE 109* 75  BUN 26* 18  CREATININE 1.47* 0.91  CALCIUM 9.0 9.2   Liver Function Tests: No results for input(s): AST, ALT, ALKPHOS, BILITOT, PROT, ALBUMIN in the last 72 hours.   CBC:  Recent Labs  11/02/15 0350 11/03/15 0400  WBC 11.0* 8.7  HGB 8.9* 9.0*  HCT 30.7* 29.9*  MCV 86.0 86.7  PLT 449* 371    Dg Chest 2 View  11/03/2015  CLINICAL DATA:  SOB, sick, faint episode earlier today, pt was transferred to the [facility]  today., pt had amputation of leg on Monday 10/30/15 EXAM: CHEST  2 VIEW COMPARISON:  A 08/17/2015 FINDINGS: Cardiac silhouette is mildly enlarged. No mediastinal or hilar masses or evidence of adenopathy. The thickening of the interstitial markings most the lung bases, stable prior study. Small bilateral pleural effusions are also stable. No new lung opacities. No pneumothorax the bony thorax is demineralized grossly intact. IMPRESSION: 1. Stable appearance the prior study. 2. Mild cardiomegaly, interstitial thickening and small effusions. Mild chronic congestive heart failure suspected. Electronically Signed   By: Lajean Manes M.D.   On: 11/03/2015 18:40   Ct Angio Chest Pe W/cm &/or Wo Cm  11/03/2015  CLINICAL DATA:  Sharp, intermittent low back pain. Wheezing. Shortness of breath. EXAM: CT ANGIOGRAPHY CHEST WITH CONTRAST TECHNIQUE: Multidetector CT imaging of the chest was performed using the standard protocol during bolus administration of intravenous contrast. Multiplanar CT image reconstructions and MIPs were obtained to evaluate the vascular anatomy. CONTRAST:  137mL OMNIPAQUE IOHEXOL 350 MG/ML SOLN COMPARISON:  Chest radiographs obtained earlier today. FINDINGS: Mediastinum/Lymph Nodes: Mildly enlarged AP window lymph node with a short axis diameter of 12 mm on image number 34 of series 4. Enlarged subcarinal node with a short axis diameter of 15 mm on image number 54. Enlarged left hilar node with a short axis diameter of 12 mm on image number 53. No axillary or upper abdominal adenopathy.  Small paraesophageal varices. Normally opacified pulmonary arteries with no pulmonary arterial filling defects. No aortic aneurysm or dissection. Lungs/Pleura: Small to moderate-sized bilateral pleural effusions. Bilateral lower lobe compressive atelectasis. 11 mm sub solid nodule in the inferior right upper lobe on image number 43. This appears more linear in the coronal and sagittal planes. 7 mm sub solid nodule in  the inferior, posterior aspect of the left upper lobe on image number 49. Diffuse peribronchial thickening and prominence of the interstitial markings. Upper abdomen: Prominent lateral segment of the left lobe of the liver and prominent caudate lobe of the liver. The liver contours are mildly lobulated. Musculoskeletal: Thoracic spine degenerative changes and changes of DISH. Review of the MIP images confirms the above findings. IMPRESSION: 1. No pulmonary emboli. 2. Small to moderate-sized bilateral pleural effusions with bilateral lower lobe atelectasis. 3. Probable combination of chronic interstitial lung disease and mild interstitial pulmonary edema. 4. Single subsolid nodule in each upper lobe, as described above. These could represent small foci of the infection or scarring. Neoplastic processes are less likely. Recommend a followup chest CT with contrast in 6-12 months. 5. Possible changes of cirrhosis of the liver with small esophageal varices. 6. Probable reactive mediastinal and left hilar adenopathy. Electronically Signed   By: Claudie Revering M.D.   On: 11/03/2015 22:21      Medications:   Impression:  Principal Problem:   CHF exacerbation (HCC) Active Problems:   Tachycardia   COPD (chronic obstructive pulmonary disease) (HCC)   Essential hypertension, benign   Diabetes (HCC)   S/P AKA (above knee amputation) unilateral (HCC)   PVD (peripheral vascular disease) (HCC)   Hypoxia   CHF (congestive heart failure) ([place])     Plan: Add Solu-Medrol 125 IV every 6 hours add neb albuterol nebulizer therapy every 6 hours physical therapy consult for strengthening and transfer practice  Consultants:    Procedures   Antibiotics:                   Code Status: Full  Family Communication:    Disposition Plan see plan above  Time spent: 30 minutes     Chaddrick Brue M   11/04/2015, 7:36 AM

## 2015-11-05 LAB — BASIC METABOLIC PANEL
Anion gap: 8 (ref 5–15)
BUN: 24 mg/dL — AB (ref 6–20)
CALCIUM: 8.5 mg/dL — AB (ref 8.9–10.3)
CO2: 34 mmol/L — ABNORMAL HIGH (ref 22–32)
CREATININE: 0.86 mg/dL (ref 0.44–1.00)
Chloride: 94 mmol/L — ABNORMAL LOW (ref 101–111)
GFR calc Af Amer: >60 mL/min (ref >60)
GLUCOSE: 341 mg/dL — AB (ref 65–99)
POTASSIUM: 4.1 mmol/L (ref 3.5–5.1)
SODIUM: 136 mmol/L (ref 135–145)

## 2015-11-05 LAB — CBC
HEMATOCRIT: 32 % — AB (ref 36.0–46.0)
Hemoglobin: 9.8 g/dL — ABNORMAL LOW (ref 12.0–15.0)
MCH: 25.5 pg — ABNORMAL LOW (ref 26.0–34.0)
MCHC: 30.6 g/dL (ref 30.0–36.0)
MCV: 83.3 fL (ref 78.0–100.0)
PLATELETS: 431 10*3/uL — AB (ref 150–400)
RBC: 3.84 MIL/uL — ABNORMAL LOW (ref 3.87–5.11)
RDW: 17.3 % — AB (ref 11.5–15.5)
WBC: 7.5 10*3/uL (ref 4.0–10.5)

## 2015-11-05 LAB — HEPATIC FUNCTION PANEL
ALK PHOS: 77 U/L (ref 38–126)
ALT: 17 U/L (ref 14–54)
AST: 16 U/L (ref 15–41)
Albumin: 3 g/dL — ABNORMAL LOW (ref 3.5–5.0)
BILIRUBIN DIRECT: 0.1 mg/dL (ref 0.1–0.5)
BILIRUBIN INDIRECT: 0.4 mg/dL (ref 0.3–0.9)
TOTAL PROTEIN: 6.5 g/dL (ref 6.5–8.1)
Total Bilirubin: 0.5 mg/dL (ref 0.3–1.2)

## 2015-11-05 LAB — GLUCOSE, CAPILLARY
GLUCOSE-CAPILLARY: 330 mg/dL — AB (ref 65–99)
GLUCOSE-CAPILLARY: 308 mg/dL — AB (ref 65–99)
GLUCOSE-CAPILLARY: 254 mg/dL — AB (ref 65–99)
Glucose-Capillary: 353 mg/dL — ABNORMAL HIGH (ref 65–99)
Glucose-Capillary: 380 mg/dL — ABNORMAL HIGH (ref 65–99)

## 2015-11-05 MED ORDER — FERROUS SULFATE 325 (65 FE) MG PO TABS
325.0000 mg | ORAL_TABLET | Freq: Every day | ORAL | Status: DC
  Administered 2015-11-06 – 2015-11-09 (×4): 325 mg via ORAL
  Filled 2015-11-05 (×4): qty 1

## 2015-11-05 NOTE — Progress Notes (Signed)
ANTICOAGULATION CONSULT NOTE -   Pharmacy Consult for Lovenox Indication: VTE prophylaxis  Allergies  Allergen Reactions  . Codeine Shortness Of Breath  . Penicillins Other (See Comments)    Has patient had a PCN reaction causing immediate rash, facial/tongue/throat swelling, SOB or lightheadedness with hypotension: unknown Has patient had a PCN reaction causing severe rash involving mucus membranes or skin necrosis: unknown Has patient had a PCN reaction that required hospitalization unknown Has patient had a PCN reaction occurring within the last 10 years: unknown If all of the above answers are "NO", then may proceed with Cephalosporin use.  . Ciprofloxacin Hives    Patient Measurements:   Heparin Dosing Weight:   Vital Signs: Temp: 98.2 F (36.8 C) (02/19 0527) Temp Source: Oral (02/19 0527) BP: 142/68 mmHg (02/19 0527) Pulse Rate: 129 (02/19 0527)  Labs:  Recent Labs  11/03/15 0400 11/03/15 1851 11/05/15 0733  HGB 9.0*  --  9.8*  HCT 29.9*  --  32.0*  PLT 371  --  431*  CREATININE 0.91  --  0.86  TROPONINI  --  <0.03  --     Estimated Creatinine Clearance: 68.1 mL/min (by C-G formula based on Cr of 0.86).   Medical History: Past Medical History  Diagnosis Date  . Hypertension   . COPD (chronic obstructive pulmonary disease) ([place])   . Stroke ([place])   . Chronic lower back pain   . Shingles Dec. 2016    Scalp  . Peripheral vascular disease ([place])   . GERD (gastroesophageal reflux disease)   . Hyperlipidemia   . Anxiety   . Type II diabetes mellitus ([place])   . Arthritis     "back" (10/30/2015)    Medications:  Scheduled:  . antiseptic oral rinse  7 mL Mouth Rinse BID  . enoxaparin (LOVENOX) injection  40 mg Subcutaneous Q24H  . [START ON 11/06/2015] ferrous sulfate  325 mg Oral Q breakfast  . furosemide  40 mg Intravenous Daily  . insulin aspart  0-9 Units Subcutaneous TID WC  . methylPREDNISolone sodium succinate  125 mg Intravenous 3 times per day     Assessment: 65 yo female, right AKA for peripheral arterial disease 5 days ago transferred to skilled nursing facility. Episode of dyspnea last evening. CT negative for PE.  CrCl 64.3 ml/min Labs are stable  Goal of Therapy:  DVT prophylaxis dosing Lovenox Monitor platelets by anticoagulation protocol: Yes   Plan:  Continue Lovenox 40 mg SQ every 24 hours Monitor renal function Monitor CBC, platelets  Pharmacy sign off for VTE prophylaxis dosing. Please enter new consult if additional dosing necessary Thanks  Abner Greenspan, Verlia Kaney [employer] 11/05/2015,12:29 PM

## 2015-11-05 NOTE — Progress Notes (Signed)
Patient continues with significant bronchospasm believe steroids with a I-125 milligrams continue nebulizer therapy patient has anemia of chronic disease with concomitant iron deficiency we'll reinstitute Feosol 325 by mouth daily patient sat in chair yesterday good urge to move and physical therapy [name] [id number] DOB: 1951-04-08 DOA: 11/03/2015 PCP: Maricela Curet, MD             Physical Exam: Blood pressure 142/68, pulse 129, temperature 98.2 F (36.8 C), temperature source Oral, resp. rate 16, SpO2 93 %. lungs show moderate intrauterine external wheeze scattered rhonchi no rales audible heart regular rhythm no S3 or S4 no heaves thrills rubs abdomen soft nontender bowel sounds normoactive   Investigations:  Recent Results (from the past 240 hour(s))  MRSA PCR Screening     Status: None   Collection Time: 11/04/15  4:25 AM  Result Value Ref Range Status   MRSA by PCR NEGATIVE NEGATIVE Final    Comment:        The GeneXpert MRSA Assay (FDA approved for NASAL specimens only), is one component of a comprehensive MRSA colonization surveillance program. It is not intended to diagnose MRSA infection nor to guide or monitor treatment for MRSA infections.      Basic Metabolic Panel:  Recent Labs  11/03/15 0400 11/05/15 0733  NA 143 136  K 4.8 4.1  CL 104 94*  CO2 30 34*  GLUCOSE 75 341*  BUN 18 24*  CREATININE 0.91 0.86  CALCIUM 9.2 8.5*   Liver Function Tests:  Recent Labs  11/04/15 0838 11/05/15 0733  AST 21 16  ALT 15 17  ALKPHOS 79 77  BILITOT 0.7 0.5  PROT 6.5 6.5  ALBUMIN 2.9* 3.0*     CBC:  Recent Labs  11/03/15 0400 11/05/15 0733  WBC 8.7 7.5  HGB 9.0* 9.8*  HCT 29.9* 32.0*  MCV 86.7 83.3  PLT 371 431*    Dg Chest 2 View  11/03/2015  CLINICAL DATA:  SOB, sick, faint episode earlier today, pt was transferred to the [facility] today., pt had amputation of leg on Monday 10/30/15 EXAM: CHEST  2 VIEW COMPARISON:  A  08/17/2015 FINDINGS: Cardiac silhouette is mildly enlarged. No mediastinal or hilar masses or evidence of adenopathy. The thickening of the interstitial markings most the lung bases, stable prior study. Small bilateral pleural effusions are also stable. No new lung opacities. No pneumothorax the bony thorax is demineralized grossly intact. IMPRESSION: 1. Stable appearance the prior study. 2. Mild cardiomegaly, interstitial thickening and small effusions. Mild chronic congestive heart failure suspected. Electronically Signed   By: Lajean Manes M.D.   On: 11/03/2015 18:40   Ct Angio Chest Pe W/cm &/or Wo Cm  11/03/2015  CLINICAL DATA:  Sharp, intermittent low back pain. Wheezing. Shortness of breath. EXAM: CT ANGIOGRAPHY CHEST WITH CONTRAST TECHNIQUE: Multidetector CT imaging of the chest was performed using the standard protocol during bolus administration of intravenous contrast. Multiplanar CT image reconstructions and MIPs were obtained to evaluate the vascular anatomy. CONTRAST:  133mL OMNIPAQUE IOHEXOL 350 MG/ML SOLN COMPARISON:  Chest radiographs obtained earlier today. FINDINGS: Mediastinum/Lymph Nodes: Mildly enlarged AP window lymph node with a short axis diameter of 12 mm on image number 34 of series 4. Enlarged subcarinal node with a short axis diameter of 15 mm on image number 54. Enlarged left hilar node with a short axis diameter of 12 mm on image number 53. No axillary or upper abdominal adenopathy. Small paraesophageal varices. Normally opacified pulmonary arteries with  no pulmonary arterial filling defects. No aortic aneurysm or dissection. Lungs/Pleura: Small to moderate-sized bilateral pleural effusions. Bilateral lower lobe compressive atelectasis. 11 mm sub solid nodule in the inferior right upper lobe on image number 43. This appears more linear in the coronal and sagittal planes. 7 mm sub solid nodule in the inferior, posterior aspect of the left upper lobe on image number 49. Diffuse  peribronchial thickening and prominence of the interstitial markings. Upper abdomen: Prominent lateral segment of the left lobe of the liver and prominent caudate lobe of the liver. The liver contours are mildly lobulated. Musculoskeletal: Thoracic spine degenerative changes and changes of DISH. Review of the MIP images confirms the above findings. IMPRESSION: 1. No pulmonary emboli. 2. Small to moderate-sized bilateral pleural effusions with bilateral lower lobe atelectasis. 3. Probable combination of chronic interstitial lung disease and mild interstitial pulmonary edema. 4. Single subsolid nodule in each upper lobe, as described above. These could represent small foci of the infection or scarring. Neoplastic processes are less likely. Recommend a followup chest CT with contrast in 6-12 months. 5. Possible changes of cirrhosis of the liver with small esophageal varices. 6. Probable reactive mediastinal and left hilar adenopathy. Electronically Signed   By: Claudie Revering M.D.   On: 11/03/2015 22:21      Medications:   Impression:  Principal Problem:   CHF exacerbation (HCC) Active Problems:   Tachycardia   COPD (chronic obstructive pulmonary disease) (HCC)   Essential hypertension, benign   Diabetes (HCC)   S/P AKA (above knee amputation) unilateral (HCC)   PVD (peripheral vascular disease) (HCC)   Hypoxia   CHF (congestive heart failure) (HCC)     Plan: Solu-Medrol 125 IV every 6 hours continue DuoNeb nebulizer increase activity out of bed to chair  Consultants:    Procedures   Antibiotics:                  Code Status: Full   Family Communication:    Disposition Plan see plan above  Time spent: 30 minutes   LOS: 1 day   [name] M   11/05/2015, 11:17 AM

## 2015-11-06 LAB — HEPATIC FUNCTION PANEL
ALT: 13 U/L — ABNORMAL LOW (ref 14–54)
AST: 13 U/L — ABNORMAL LOW (ref 15–41)
Albumin: 3.2 g/dL — ABNORMAL LOW (ref 3.5–5.0)
Alkaline Phosphatase: 71 U/L (ref 38–126)
BILIRUBIN DIRECT: 0.1 mg/dL (ref 0.1–0.5)
BILIRUBIN INDIRECT: 0.4 mg/dL (ref 0.3–0.9)
TOTAL PROTEIN: 6.5 g/dL (ref 6.5–8.1)
Total Bilirubin: 0.5 mg/dL (ref 0.3–1.2)

## 2015-11-06 LAB — BASIC METABOLIC PANEL
ANION GAP: 10 (ref 5–15)
BUN: 32 mg/dL — AB (ref 6–20)
CALCIUM: 8.6 mg/dL — AB (ref 8.9–10.3)
CO2: 37 mmol/L — ABNORMAL HIGH (ref 22–32)
Chloride: 90 mmol/L — ABNORMAL LOW (ref 101–111)
Creatinine, Ser: 0.96 mg/dL (ref 0.44–1.00)
GFR calc Af Amer: >60 mL/min (ref >60)
Glucose, Bld: 458 mg/dL — ABNORMAL HIGH (ref 65–99)
POTASSIUM: 3.9 mmol/L (ref 3.5–5.1)
SODIUM: 137 mmol/L (ref 135–145)

## 2015-11-06 LAB — GLUCOSE, CAPILLARY
Glucose-Capillary: 467 mg/dL — ABNORMAL HIGH (ref 65–99)
Glucose-Capillary: 393 mg/dL — ABNORMAL HIGH (ref 65–99)
Glucose-Capillary: 229 mg/dL — ABNORMAL HIGH (ref 65–99)
Glucose-Capillary: 317 mg/dL — ABNORMAL HIGH (ref 65–99)

## 2015-11-06 MED ORDER — INSULIN GLARGINE 100 UNIT/ML ~~LOC~~ SOLN
34.0000 [IU] | Freq: Every day | SUBCUTANEOUS | Status: DC
  Administered 2015-11-06 – 2015-11-09 (×4): 34 [IU] via SUBCUTANEOUS
  Filled 2015-11-06 (×5): qty 0.34

## 2015-11-06 MED ORDER — INSULIN ASPART 100 UNIT/ML ~~LOC~~ SOLN
18.0000 [IU] | Freq: Once | SUBCUTANEOUS | Status: AC
  Administered 2015-11-06: 18 [IU] via SUBCUTANEOUS

## 2015-11-06 MED ORDER — METHYLPREDNISOLONE SODIUM SUCC 125 MG IJ SOLR
80.0000 mg | Freq: Three times a day (TID) | INTRAMUSCULAR | Status: DC
  Administered 2015-11-06 – 2015-11-07 (×3): 80 mg via INTRAVENOUS
  Filled 2015-11-06 (×3): qty 2

## 2015-11-06 NOTE — Evaluation (Addendum)
Physical Therapy Evaluation Patient Details Name: [name] MRN: [id number] DOB: 1951-06-18 Today's Date: 11/06/2015   History of Present Illness  65 yo female with multiple medical issues comes in from [facility] center for hypoxia and tachycardia. Pt just d[facility]c from [facility] on admission date  after right aka for ischemic limb. Pt feels fine. Denies any fevers. No sob. No cough. She is not on oxygen chronically and was not sent to [facility] with oxygen per her report. Unknown what her oxygen sats were on RA at [facility] or in the ED. Pt does have some swelling in her left le but not her right stump. Pt has no complaints. Referred for admission for chf exac.   Clinical Impression  PT is a recent rt AKA.  Pt LE was not wrapped when therapist entered room.  Therapist educated pt about the importance of keeping her LE wrapped to shape for prothesis.  Pt verbalized understanding.  Therapist placed 4x4 over incision and wrapped LE in figure 8 with ace wrap.     Follow Up Recommendations CIR;SNF    Equipment Recommendations  None recommended by PT    Recommendations for Other Services   OT    Precautions / Restrictions Precautions Precautions: Fall      Mobility  Bed Mobility Overal bed mobility: Modified Independent       Supine to sit: Modified independent (Device/Increase time) Sit to supine: Modified independent (Device/Increase time)      Transfers Overall transfer level: Needs assistance Equipment used: Rolling walker (2 wheeled) Transfers: Sit to/from Stand Sit to Stand: Max assist         General transfer comment: Pt did not transfer to char at this time             Pertinent Vitals/Pain Pain Assessment: 0-10 Pain Score: 8  Pain Location: back and Rt LE  Pain Descriptors / Indicators: Aching Pain Intervention(s): Limited activity within patient's tolerance    Home Living Family/patient expects to be discharged to:: Skilled nursing facility Living  Arrangements: Spouse/significant other               Additional Comments: Spouse can provide 24/7 S but per chart has dementia. Very supportive family.    Prior Function Level of Independence: Needs assistance   Gait / Transfers Assistance Needed: max assist since BKA  ADL's / Homemaking Assistance Needed: total assist since BKA   Comments: Pt is a recent rt AKA needs assist for transfers unable to ambulate at this time.         Extremity/Trunk Assessment               Lower Extremity Assessment: Generalized weakness RLE Deficits / Details: Rt AKA lifts antigravity independently, sidelying is unable to take past neutral.           Cognition Arousal/Alertness: Awake/alert Behavior During Therapy: WFL for tasks assessed/performed Overall Cognitive Status: Within Functional Limits for tasks assessed                         Exercises General Exercises - Lower Extremity Ankle Circles/Pumps: Left;10 reps Hip ABduction/ADduction: Both;10 reps Straight Leg Raises: Both;10 reps      Assessment/Plan    PT Assessment Patient needs continued PT services  PT Diagnosis Difficulty walking;Generalized weakness;Other (comment) (recent Rt AKA)   PT Problem List Decreased strength;Decreased activity tolerance;Decreased balance;Decreased mobility;Pain  PT Treatment Interventions Gait training;Therapeutic activities;Patient/family education   PT Goals (Current goals can  be found in the Care Plan section) Acute Rehab PT Goals Patient Stated Goal: to be independent again PT Goal Formulation: With patient/family Time For Goal Achievement: 11/07/15 Potential to Achieve Goals: Good    Frequency Min 5X/week           End of Session Equipment Utilized During Treatment: Gait belt Activity Tolerance: Patient tolerated treatment well Patient left: in bed           Time: 1030-1055 PT Time Calculation (min) (ACUTE ONLY): 25 min   Charges:   PT Evaluation $PT  Eval Moderate Complexity: 1 Procedure     PT G CodesRayetta Humphrey, PT CLT [phone] 11/06/2015, 12:45 PM

## 2015-11-06 NOTE — Progress Notes (Signed)
Inpatient Diabetes Program Recommendations  AACE/ADA: New Consensus Statement on Inpatient Glycemic Control (2015)  Target Ranges:  Prepandial:   less than 140 mg/dL      Peak postprandial:   less than 180 mg/dL (1-2 hours)      Critically ill patients:  140 - 180 mg/dL   Review of Glycemic Control:  Results for INAARA, HOLLIMAN (MRN [id number]) as of 11/06/2015 15:30  Ref. Range 11/05/2015 12:04 11/05/2015 17:03 11/05/2015 20:30 11/06/2015 09:00 11/06/2015 11:59  Glucose-Capillary Latest Ref Range: 65-99 mg/dL 254 (H) 353 (H) 380 (H) 467 (H) 393 (H)   Diabetes history: Type 2 diabetes Outpatient Diabetes medications: Lantus 34 units q HS, Metformin 500 mg bid Current orders for Inpatient glycemic control:  Novolog sensitive tid with meals, Solumedrol 80 mg IV q 8 hours  Inpatient Diabetes Program Recommendations:   Called and spoke with Dr. Cindie Laroche.  Suggested restart of home dose of Lantus.  Order received to restart Lantus 34 units daily.  Thanks, Adah Perl, RN, BC-ADM Inpatient Diabetes Coordinator Pager [phone] (8a-5p)

## 2015-11-06 NOTE — Progress Notes (Signed)
Patient is elevated glucose is in the 400s we'll decrease Solu-Medrol 125-60 mg IV every 8 hours bronchospasm has improved since yesterday [name] [id number] DOB: September 28, 1950 DOA: 11/03/2015 PCP: Maricela Curet, MD             Physical Exam: Blood pressure 133/68, pulse 102, temperature 98.5 F (36.9 C), temperature source Oral, resp. rate 18, SpO2 95 %. lungs show scattered rhonchi bilaterally mild to moderate wheezing inspiratory and expiratory bilaterally no rales audible heart regular rhythm no S3-S4 no heaves thrills rubs   Investigations:  Recent Results (from the past 240 hour(s))  MRSA PCR Screening     Status: None   Collection Time: 11/04/15  4:25 AM  Result Value Ref Range Status   MRSA by PCR NEGATIVE NEGATIVE Final    Comment:        The GeneXpert MRSA Assay (FDA approved for NASAL specimens only), is one component of a comprehensive MRSA colonization surveillance program. It is not intended to diagnose MRSA infection nor to guide or monitor treatment for MRSA infections.      Basic Metabolic Panel:  Recent Labs  11/05/15 0733 11/06/15 0714  NA 136 137  K 4.1 3.9  CL 94* 90*  CO2 34* 37*  GLUCOSE 341* 458*  BUN 24* 32*  CREATININE 0.86 0.96  CALCIUM 8.5* 8.6*   Liver Function Tests:  Recent Labs  11/05/15 0733 11/06/15 0714  AST 16 13*  ALT 17 13*  ALKPHOS 77 71  BILITOT 0.5 0.5  PROT 6.5 6.5  ALBUMIN 3.0* 3.2*     CBC:  Recent Labs  11/05/15 0733  WBC 7.5  HGB 9.8*  HCT 32.0*  MCV 83.3  PLT 431*    No results found.    Medications:   Impression:  Principal Problem:   CHF exacerbation (HCC) Active Problems:   Tachycardia   COPD (chronic obstructive pulmonary disease) (HCC)   Essential hypertension, benign   Diabetes (HCC)   S/P AKA (above knee amputation) unilateral (HCC)   PVD (peripheral vascular disease) (HCC)   Hypoxia   CHF (congestive heart failure) (HCC)     Plan: Decrease Solu-Medrol  to 60 IV every 8 hours monitor glucoses continue nebulizer therapy   Consultants   Procedures   Antibiotics:                   Code Status: Full  Family Communication:  Patient and husband   Disposition Plan see plan above  Time spent: 30 minutes   LOS: 2 days   [name] M   11/06/2015, 1:43 PM

## 2015-11-07 LAB — BASIC METABOLIC PANEL
Anion gap: 10 (ref 5–15)
BUN: 34 mg/dL — ABNORMAL HIGH (ref 6–20)
CHLORIDE: 90 mmol/L — AB (ref 101–111)
CO2: 37 mmol/L — ABNORMAL HIGH (ref 22–32)
CREATININE: 0.95 mg/dL (ref 0.44–1.00)
Calcium: 8.7 mg/dL — ABNORMAL LOW (ref 8.9–10.3)
GFR calc non Af Amer: >60 mL/min (ref >60)
Glucose, Bld: 374 mg/dL — ABNORMAL HIGH (ref 65–99)
POTASSIUM: 3.4 mmol/L — AB (ref 3.5–5.1)
SODIUM: 137 mmol/L (ref 135–145)

## 2015-11-07 LAB — GLUCOSE, CAPILLARY
GLUCOSE-CAPILLARY: 340 mg/dL — AB (ref 65–99)
GLUCOSE-CAPILLARY: 306 mg/dL — AB (ref 65–99)
Glucose-Capillary: 320 mg/dL — ABNORMAL HIGH (ref 65–99)
Glucose-Capillary: 341 mg/dL — ABNORMAL HIGH (ref 65–99)

## 2015-11-07 LAB — HEPATIC FUNCTION PANEL
ALBUMIN: 3.2 g/dL — AB (ref 3.5–5.0)
ALT: 15 U/L (ref 14–54)
AST: 16 U/L (ref 15–41)
Alkaline Phosphatase: 64 U/L (ref 38–126)
Bilirubin, Direct: 0.1 mg/dL (ref 0.1–0.5)
Indirect Bilirubin: 0.6 mg/dL (ref 0.3–0.9)
TOTAL PROTEIN: 6.3 g/dL — AB (ref 6.5–8.1)
Total Bilirubin: 0.7 mg/dL (ref 0.3–1.2)

## 2015-11-07 MED ORDER — INSULIN ASPART 100 UNIT/ML ~~LOC~~ SOLN
0.0000 [IU] | Freq: Three times a day (TID) | SUBCUTANEOUS | Status: DC
  Administered 2015-11-07: 7 [IU] via SUBCUTANEOUS
  Administered 2015-11-08: 9 [IU] via SUBCUTANEOUS
  Administered 2015-11-08: 7 [IU] via SUBCUTANEOUS
  Administered 2015-11-08: 3 [IU] via SUBCUTANEOUS

## 2015-11-07 MED ORDER — METHYLPREDNISOLONE SODIUM SUCC 125 MG IJ SOLR
60.0000 mg | Freq: Three times a day (TID) | INTRAMUSCULAR | Status: DC
  Administered 2015-11-07 – 2015-11-09 (×6): 60 mg via INTRAVENOUS
  Filled 2015-11-07 (×6): qty 2

## 2015-11-07 NOTE — Progress Notes (Signed)
Patient has a blood sugar of 341 and has no bedtime coverage insulin ordered.  MD notified.  Orders to administer bed time coverage was received.

## 2015-11-07 NOTE — Progress Notes (Signed)
Physical Therapy Treatment Patient Details Name: [name] MRN: [id number] DOB: [birth year] Today's Date: 11/07/2015    History of Present Illness 66 yo female with multiple medical issues comes in from [facility] center for hypoxia and tachycardia. [facility]  after right aka for ischemic limb. Pt feels fine. Denies any fevers. No sob. No cough. She is not on oxygen chronically and was not sent to [facility] with oxygen per her report. Unknown what her oxygen sats were on [facility] at [facility] or in the ED. Pt does have some swelling in her left le but not her right stump. Pt has no complaints. Referred for admission for chf exac.     PT Comments    Pt Leg wrap on Rt LE had came off and had not been replaced.  It will be important to keep leg wrapped for shaping for prothesis.  Therapist rewrapped LE using kerlix and ace wrap.  PT does not have confidence to totally lift Lt leg up for ambulation yet but will stand pivot with ease.   Follow Up Recommendations  CIR;SNF     Equipment Recommendations  None recommended by PT    Recommendations for Other Services       Precautions / Restrictions Precautions Precautions: Fall    Mobility  Bed Mobility Overal bed mobility: Modified Independent       Supine to sit: Modified independent (Device/Increase time) Sit to supine: Modified independent (Device/Increase time)      Transfers Overall transfer level: Needs assistance Equipment used: Standard walker Transfers: Sit to/from Stand Sit to Stand: Supervision Stand pivot transfers: Supervision    Repeated for activity tolerance.       Ambulation/Gait Ambulation/Gait assistance: Min assist Ambulation Distance (Feet): 0 Feet Assistive device: Standard walker Gait Pattern/deviations: Decreased step length - left   Gait velocity interpretation: Below normal speed for age/gender General Gait Details: Pt UE strength wears out         Cognition  Arousal/Alertness: Awake/alert Behavior During Therapy: WFL for tasks assessed/performed Overall Cognitive Status: Within Functional Limits for tasks assessed                      Exercises Other Exercises Other Exercises: while standing with walker Rt hip extension, abduction x 10 reps, walker push up x 12 reps         Pertinent Vitals/Pain Pain Score: 8  Pain Location: Rt LE Pain Descriptors / Indicators: Aching Pain Intervention(s): Limited activity within patient's tolerance           PT Goals (current goals can now be found in the care plan section) Acute Rehab PT Goals Patient Stated Goal: to be independent again PT Goal Formulation: With patient/family Time For Goal Achievement: 11/07/15 Potential to Achieve Goals: Good Progress towards PT goals: Progressing toward goals    Frequency  Min 5X/week    PT Plan Current plan remains appropriate       End of Session Equipment Utilized During Treatment: Gait belt Activity Tolerance: Patient tolerated treatment well Patient left: in chair;with call bell/phone within reach     Time: 0940-1013 PT Time Calculation (min) (ACUTE ONLY): 33 min  Charges:  $Therapeutic Exercise: 8-22 mins $Therapeutic Activity: 8-22 mins                    G CodesRayetta Humphrey, PT CLT [phone] 11/07/2015, 10:11 AM

## 2015-11-07 NOTE — Progress Notes (Signed)
She continues with significant moderate bronchospasm both his return expiratory sugars better control with the addition of her original Lantus insulin steroids being reduced from 80-60 every 8 hours since receiving physical therapy for recent AKA and transferring weight and ambulating with 4. walker TMIA STRZELECKI [id number] DOB: 23-Dec-1950 DOA: 11/03/2015 PCP: Maricela Curet, MD             Physical Exam: Blood pressure 154/58, pulse 64, temperature 97.7 F (36.5 C), temperature source Oral, resp. rate 18, SpO2 94 %. no JVD no carotid bruits no thyromegaly lungs moderate his return expiratory wheezes scattered coarse rhonchi bilaterally no rales audible heart regular rhythm no S3 or S4 no heaves thrills rubs   Investigations:  Recent Results (from the past 240 hour(s))  MRSA PCR Screening     Status: None   Collection Time: 11/04/15  4:25 AM  Result Value Ref Range Status   MRSA by PCR NEGATIVE NEGATIVE Final    Comment:        The GeneXpert MRSA Assay (FDA approved for NASAL specimens only), is one component of a comprehensive MRSA colonization surveillance program. It is not intended to diagnose MRSA infection nor to guide or monitor treatment for MRSA infections.      Basic Metabolic Panel:  Recent Labs  11/06/15 0714 11/07/15 0627  NA 137 137  K 3.9 3.4*  CL 90* 90*  CO2 37* 37*  GLUCOSE 458* 374*  BUN 32* 34*  CREATININE 0.96 0.95  CALCIUM 8.6* 8.7*   Liver Function Tests:  Recent Labs  11/06/15 0714 11/07/15 0627  AST 13* 16  ALT 13* 15  ALKPHOS 71 64  BILITOT 0.5 0.7  PROT 6.5 6.3*  ALBUMIN 3.2* 3.2*     CBC:  Recent Labs  11/05/15 0733  WBC 7.5  HGB 9.8*  HCT 32.0*  MCV 83.3  PLT 431*    No results found.    Medications:  Impression:  Principal Problem:   CHF exacerbation ([place]) Active Problems:   Tachycardia   COPD (chronic obstructive pulmonary disease) (HCC)   Essential hypertension, benign   Diabetes  (HCC)   S/P AKA (above knee amputation) unilateral (HCC)   PVD (peripheral vascular disease) (HCC)   Hypoxia   CHF (congestive heart failure) ([place])     Plan: Decrease Solu-Medrol to 60 mg IV every 8 hours continue Lantus 34 units at bedtime and NovoLog sliding scale coverage aggressive physical therapy for ambulating and transferring weight   Consultants:    Procedures   Antibiotics:                   Code Status: Full   Family Communication:  [place] with patient and sister  Disposition Plan see plan above  Time spent: 30 minutes   LOS: 3 days   [name]   11/07/2015, 12:18 PM

## 2015-11-07 NOTE — Progress Notes (Signed)
Rehab Admissions Coordinator Note:  Patient was screened by Cleatrice Burke for appropriateness for an Inpatient Acute Rehab admission per PT recommendation for CIR vs SNF during evaluation today.  I am familiar with pt from recent [facility] admission for AKA. I had met with pt to offer an inpt rehab admission at [facility] last week and pt requested SNF rehab due to copay cost for inpt rehab vs SNF rehab copay cost. I discussed also last week the option for a payment plan to cover cost for hospitalization as well as copays for inpt rehab. She deferred and requested SNF rehab due to cost. If patient has changed her mind and would prefer an inpt rehab admission I could pursue insurance authorization. I will contact RN CM and SW to discuss.  Cleatrice Burke 11/07/2015, 8:34 AM  I can be reached at [phone].

## 2015-11-07 NOTE — Progress Notes (Signed)
Inpatient Diabetes Program Recommendations  AACE/ADA: New Consensus Statement on Inpatient Glycemic Control (2015)  Target Ranges:  Prepandial:   less than 140 mg/dL      Peak postprandial:   less than 180 mg/dL (1-2 hours)      Critically ill patients:  140 - 180 mg/dL   Review of Glycemic Control  Diabetes history: DM2 Outpatient Diabetes medications: Lantus 34 units QHS, Metformin 500 mg BID Current orders for Inpatient glycemic control: Lantus 34 units daily, Novolog 0-9 units TID with meals  Inpatient Diabetes Program Recommendations: Insulin - Basal: Fasting glucose is 374 mg/dl this morning. If steroids are continued as ordered, please consider increasing Lantus to 38 units daily. Correction (SSI): Please consider ordering Novolog bedtime correction scale. Insulin - Meal Coverage: If steorids are continued as ordered (Solumedrol 80 mg TID), please consider ordering Novolog 5 units TID with meals for meal coverage.  Thanks, Barnie Alderman, RN, MSN, CDE Diabetes Coordinator Inpatient Diabetes Program [phone] (Team Pager from [place] to [place]) [phone] ([facility] office) [phone] [facility] office) [phone] [facility] office)

## 2015-11-08 LAB — GLUCOSE, CAPILLARY
GLUCOSE-CAPILLARY: 399 mg/dL — AB (ref 65–99)
GLUCOSE-CAPILLARY: 310 mg/dL — AB (ref 65–99)
Glucose-Capillary: 209 mg/dL — ABNORMAL HIGH (ref 65–99)
Glucose-Capillary: 327 mg/dL — ABNORMAL HIGH (ref 65–99)

## 2015-11-08 MED ORDER — INSULIN ASPART 100 UNIT/ML ~~LOC~~ SOLN
0.0000 [IU] | Freq: Three times a day (TID) | SUBCUTANEOUS | Status: DC
  Administered 2015-11-09 (×3): 7 [IU] via SUBCUTANEOUS

## 2015-11-08 MED ORDER — INSULIN ASPART 100 UNIT/ML ~~LOC~~ SOLN
0.0000 [IU] | Freq: Every day | SUBCUTANEOUS | Status: DC
  Administered 2015-11-08: 4 [IU] via SUBCUTANEOUS

## 2015-11-08 NOTE — Progress Notes (Signed)
She continues with moderate intrauterine external wheezing on IV steroids Solu-Medrol 60 every 6 hours receiving physical therapy [name] [id number] DOB: 01/29/1951 DOA: 11/03/2015 PCP: Maricela Curet, MD             Physical Exam: Blood pressure 134/67, pulse 60, temperature 97.7 F (36.5 C), temperature source Oral, resp. rate 18, SpO2 92 %. lungs moderate inspiratory &  expiratory wheezes scattered coarse rhonchi no rales audible. Heart regular rhythm no S3-S4 no heaves thrills or rubs   Investigations:  Recent Results (from the past 240 hour(s))  MRSA PCR Screening     Status: None   Collection Time: 11/04/15  4:25 AM  Result Value Ref Range Status   MRSA by PCR NEGATIVE NEGATIVE Final    Comment:        The GeneXpert MRSA Assay (FDA approved for NASAL specimens only), is one component of a comprehensive MRSA colonization surveillance program. It is not intended to diagnose MRSA infection nor to guide or monitor treatment for MRSA infections.      Basic Metabolic Panel:  Recent Labs  11/06/15 0714 11/07/15 0627  NA 137 137  K 3.9 3.4*  CL 90* 90*  CO2 37* 37*  GLUCOSE 458* 374*  BUN 32* 34*  CREATININE 0.96 0.95  CALCIUM 8.6* 8.7*   Liver Function Tests:  Recent Labs  11/06/15 0714 11/07/15 0627  AST 13* 16  ALT 13* 15  ALKPHOS 71 64  BILITOT 0.5 0.7  PROT 6.5 6.3*  ALBUMIN 3.2* 3.2*     CBC: No results for input(s): WBC, NEUTROABS, HGB, HCT, MCV, PLT in the last 72 hours.  No results found.    Medication  Impression:  Principal Problem:   CHF exacerbation ([place]) Active Problems:   Tachycardia   COPD (chronic obstructive pulmonary disease) (HCC)   Essential hypertension, benign   Diabetes (HCC)   S/P AKA (above knee amputation) unilateral (HCC)   PVD (peripheral vascular disease) (HCC)   Hypoxia   CHF (congestive heart failure) (HCC)     Plan: Hopefully switch to by mouth steroids in 24 hours a  discharge  Consultants: Physical therapy   Procedures right AKA   Antibiotics:                   Code Status:  Family Communication:  Spoke with patient and husband   Disposition Plan see plan above  Time spent: 30 minutes   LOS: 4 days   [name] M   11/08/2015, 12:07 PM

## 2015-11-08 NOTE — Progress Notes (Signed)
Rewrapped right AKA stump with ace wrap secured with tape.  Will continue to monitor.

## 2015-11-09 LAB — GLUCOSE, CAPILLARY
GLUCOSE-CAPILLARY: 349 mg/dL — AB (ref 65–99)
GLUCOSE-CAPILLARY: 345 mg/dL — AB (ref 65–99)
Glucose-Capillary: 341 mg/dL — ABNORMAL HIGH (ref 65–99)

## 2015-11-09 MED ORDER — PREDNISONE 20 MG PO TABS
20.0000 mg | ORAL_TABLET | Freq: Two times a day (BID) | ORAL | Status: DC
  Administered 2015-11-09 (×2): 20 mg via ORAL
  Filled 2015-11-09 (×2): qty 1

## 2015-11-09 MED ORDER — OXYCODONE HCL 10 MG PO TABS
10.0000 mg | ORAL_TABLET | ORAL | Status: DC | PRN
Start: 2015-11-09 — End: 2016-03-06

## 2015-11-09 MED ORDER — FERROUS SULFATE 325 (65 FE) MG PO TABS: 325.0000 mg | ORAL_TABLET | Freq: Every day | ORAL | Status: DC

## 2015-11-09 MED ORDER — PREDNISONE 20 MG PO TABS: 20.0000 mg | ORAL_TABLET | Freq: Two times a day (BID) | ORAL | Status: DC

## 2015-11-09 NOTE — Progress Notes (Signed)
Report called to receiving nurse at [facility].  Questions answered.  Patient discharged via wheelchair accompanied by staff for discharge with family for transport to [facility].  Stable at discharge with family.

## 2015-11-09 NOTE — Clinical Social Work Note (Signed)
CSW received a call from [place] at [facility] who advised that the facility could not accept any patients including patient who came from [facility] due to an outbreak of influenza.     CSW met with patient and sister and discussed influenza situation with them.  Patient and sister advised that they wanted HHPT rather than choose a different patient.   Mehek Grega, Clydene Pugh, LCSW

## 2015-11-09 NOTE — NC FL2 (Signed)
[place] LEVEL OF CARE SCREENING TOOL     IDENTIFICATION  Patient Name: [name] Birthdate: 09-29-1950 Sex: female Admission Date (Current Location): 11/03/2015  [facility] and [place] Number:  [employer] and Address:  [facility] [address], [place]      Provider Number: [id number]  Attending Physician Name and Address:  Lucia Gaskins, MD  Relative Name and Phone Number:   [name], spouse, [phone])    Current Level of Care: Hospital Recommended Level of Care: [place] Prior Approval Number:    Date Approved/Denied:   PASRR Number:  ([id number])  Discharge Plan: SNF    Current Diagnoses: Patient Active Problem List   Diagnosis Date Noted  . Hypoxia 11/03/2015  . CHF (congestive heart failure) ([place]) 11/03/2015  . S/P AKA (above knee amputation) unilateral ([place])   . Abnormality of gait   . Acute blood loss anemia   . Postoperative pain of extremity   . Phantom limb pain ([place])   . Essential hypertension   . Chronic obstructive pulmonary disease ([place])   . Chronic low back pain   . Poorly controlled type 2 diabetes mellitus with peripheral neuropathy ([place])   . PVD (peripheral vascular disease) ([place])   . Respiratory rate decreased   . Leukocytosis   . Atherosclerosis of right leg ([place]) 10/30/2015  . Pulmonary edema 08/18/2015  . CHF exacerbation ([place]) 08/18/2015  . SOB (shortness of breath) 08/18/2015  . Open wound of knee, leg (except thigh), and ankle 08/18/2015  . Abnormal echocardiogram   . SVT (supraventricular tachycardia) ([place])   . Lower limb ischemia 04/09/2015  . Critical lower limb ischemia 04/09/2015  . AKI (acute kidney injury) ([place]) 04/09/2015  . Hyperkalemia 04/09/2015  . Pancreatitis 12/26/2013  . Tachycardia 12/26/2013  . Dehydration 12/26/2013  . Vomiting 12/26/2013  . COPD (chronic obstructive pulmonary disease) ([place]) 12/26/2013  . Essential hypertension, benign  12/26/2013  . Diabetes ([place]) 12/26/2013  . Hypomagnesemia 12/26/2013    Orientation RESPIRATION BLADDER Height & Weight     Self, Time, Situation, Place  Normal Continent Weight:   Height:     BEHAVIORAL SYMPTOMS/MOOD NEUROLOGICAL BOWEL NUTRITION STATUS      Continent  (Heart healthy/carb modified)  AMBULATORY STATUS COMMUNICATION OF NEEDS Skin   Extensive Assist   Surgical wounds                       Personal Care Assistance Level of Assistance  Bathing, Dressing, Feeding Bathing Assistance: Maximum assistance Feeding assistance: Maximum assistance Dressing Assistance: Maximum assistance     Functional Limitations Info             SPECIAL CARE FACTORS FREQUENCY  PT (By licensed PT)                    Contractures      Additional Factors Info  Allergies, Code Status Code Status Info:  (Full) Allergies Info:  (Codeine, Penicillins, Ciprofloxacin)   Insulin Sliding Scale Info: 3x daiily       Current Medications (11/09/2015):  This is the current hospital active medication list Current Facility-Administered Medications  Medication Dose Route Frequency Provider Last Rate Last Dose  . albuterol (PROVENTIL) (2.5 MG/3ML) 0.083% nebulizer solution 2.5 mg  2.5 mg Nebulization Q6H PRN Lucia Gaskins, MD      . antiseptic oral rinse (CPC / CETYLPYRIDINIUM CHLORIDE 0.05%) solution 7 mL  7 mL Mouth Rinse BID  Phillips Grout, MD   7 mL at 11/09/15 1000  . enoxaparin (LOVENOX) injection 40 mg  40 mg Subcutaneous Q24H Lucia Gaskins, MD   40 mg at 11/09/15 0920  . ferrous sulfate tablet 325 mg  325 mg Oral Q breakfast Lucia Gaskins, MD   325 mg at 11/09/15 0841  . furosemide (LASIX) injection 40 mg  40 mg Intravenous Daily Phillips Grout, MD   40 mg at 11/09/15 [id number]  . insulin aspart (novoLOG) injection 0-5 Units  0-5 Units Subcutaneous QHS Lucia Gaskins, MD   4 Units at 11/08/15 2221  . insulin aspart (novoLOG) injection 0-9 Units  0-9 Units Subcutaneous  TID WC Lucia Gaskins, MD   7 Units at 11/09/15 1215  . insulin glargine (LANTUS) injection 34 Units  34 Units Subcutaneous Daily Lucia Gaskins, MD   34 Units at 11/09/15 [phone]  . oxyCODONE (Oxy IR/ROXICODONE) immediate release tablet 10 mg  10 mg Oral Q4H PRN Lucia Gaskins, MD   10 mg at 11/08/15 2220  . predniSONE (DELTASONE) tablet 20 mg  20 mg Oral BID WC Lucia Gaskins, MD   20 mg at 11/09/15 [id number]     Discharge Medications: Please see discharge summary for a list of discharge medications.  Relevant Imaging Results:  Relevant Lab Results:   Additional Information    Copper Basnett, Clydene Pugh, LCSW

## 2015-11-09 NOTE — NC FL2 (Deleted)
[place] LEVEL OF CARE SCREENING TOOL     IDENTIFICATION  Patient Name: [name] Birthdate: 1951/02/19 Sex: female Admission Date (Current Location): 11/03/2015  [facility] and [place] Number:  [employer] and Address:  [facility] [address] [place]      Provider Number: [id number]  Attending Physician Name and Address:  Lucia Gaskins, MD  Relative Name and Phone Number:   [name], spouse, [phone])    Current Level of Care: Hospital Recommended Level of Care: [place] Prior Approval Number:    Date Approved/Denied:   PASRR Number:  ([id number])  Discharge Plan: SNF    Current Diagnoses: Patient Active Problem List   Diagnosis Date Noted  . Hypoxia 11/03/2015  . CHF (congestive heart failure) ([place]) 11/03/2015  . S/P AKA (above knee amputation) unilateral ([place])   . Abnormality of gait   . Acute blood loss anemia   . Postoperative pain of extremity   . Phantom limb pain ([place])   . Essential hypertension   . Chronic obstructive pulmonary disease ([place])   . Chronic low back pain   . Poorly controlled type 2 diabetes mellitus with peripheral neuropathy ([place])   . PVD (peripheral vascular disease) ([place])   . Respiratory rate decreased   . Leukocytosis   . Atherosclerosis of right leg ([place]) 10/30/2015  . Pulmonary edema 08/18/2015  . CHF exacerbation ([place]) 08/18/2015  . SOB (shortness of breath) 08/18/2015  . Open wound of knee, leg (except thigh), and ankle 08/18/2015  . Abnormal echocardiogram   . SVT (supraventricular tachycardia) ([employer])   . Lower limb ischemia 04/09/2015  . Critical lower limb ischemia 04/09/2015  . AKI (acute kidney injury) ([place]) 04/09/2015  . Hyperkalemia 04/09/2015  . Pancreatitis 12/26/2013  . Tachycardia 12/26/2013  . Dehydration 12/26/2013  . Vomiting 12/26/2013  . COPD (chronic obstructive pulmonary disease) ([place]) 12/26/2013  . Essential hypertension, benign  12/26/2013  . Diabetes ([place]) 12/26/2013  . Hypomagnesemia 12/26/2013    Orientation RESPIRATION BLADDER Height & Weight     Self, Time, Situation, Place  Normal Continent Weight:   Height:     BEHAVIORAL SYMPTOMS/MOOD NEUROLOGICAL BOWEL NUTRITION STATUS      Continent  (Heart healthy/carb modified)  AMBULATORY STATUS COMMUNICATION OF NEEDS Skin   Extensive Assist   Surgical wounds                       Personal Care Assistance Level of Assistance  Bathing, Dressing, Feeding Bathing Assistance: Maximum assistance Feeding assistance: Maximum assistance Dressing Assistance: Maximum assistance     Functional Limitations Info             SPECIAL CARE FACTORS FREQUENCY  PT (By licensed PT)                    Contractures      Additional Factors Info  Allergies, Code Status Code Status Info:  (Full) Allergies Info:  (Codeine, Penicillins, Ciprofloxacin)   Insulin Sliding Scale Info: 3x daiily       Current Medications (11/09/2015):  This is the current hospital active medication list Current Facility-Administered Medications  Medication Dose Route Frequency Provider Last Rate Last Dose  . albuterol (PROVENTIL) (2.5 MG/3ML) 0.083% nebulizer solution 2.5 mg  2.5 mg Nebulization Q6H PRN Lucia Gaskins, MD      . antiseptic oral rinse (CPC / CETYLPYRIDINIUM CHLORIDE 0.05%) solution 7 mL  7 mL Mouth Rinse BID  Phillips Grout, MD   7 mL at 11/09/15 1000  . enoxaparin (LOVENOX) injection 40 mg  40 mg Subcutaneous Q24H Lucia Gaskins, MD   40 mg at 11/09/15 0920  . ferrous sulfate tablet 325 mg  325 mg Oral Q breakfast Lucia Gaskins, MD   325 mg at 11/09/15 0841  . furosemide (LASIX) injection 40 mg  40 mg Intravenous Daily Phillips Grout, MD   40 mg at 11/09/15 [id number]  . insulin aspart (novoLOG) injection 0-5 Units  0-5 Units Subcutaneous QHS Lucia Gaskins, MD   4 Units at 11/08/15 2221  . insulin aspart (novoLOG) injection 0-9 Units  0-9 Units Subcutaneous  TID WC Lucia Gaskins, MD   7 Units at 11/09/15 1215  . insulin glargine (LANTUS) injection 34 Units  34 Units Subcutaneous Daily Lucia Gaskins, MD   34 Units at 11/09/15 [phone]  . oxyCODONE (Oxy IR/ROXICODONE) immediate release tablet 10 mg  10 mg Oral Q4H PRN Lucia Gaskins, MD   10 mg at 11/08/15 2220  . predniSONE (DELTASONE) tablet 20 mg  20 mg Oral BID WC Lucia Gaskins, MD   20 mg at 11/09/15 [id number]     Discharge Medications: Please see discharge summary for a list of discharge medications.  Relevant Imaging Results:  Relevant Lab Results:   Additional Information    Shauntea Lok, Clydene Pugh, LCSW

## 2015-11-09 NOTE — Care Management (Signed)
Spoke with patient and sister Margaretha Sheffield at the bedside. Patient has decided to go to SNF for PT, Does not want [facility], prefers Customer service manager. Informed [facility]al[facility] CSW.

## 2015-11-09 NOTE — Discharge Summary (Signed)
Physician Discharge Summary  [name] [id number] DOB: 1950/10/01 DOA: 11/03/2015  PCP: Maricela Curet, MD  Admit date: 11/03/2015 Discharge date: 11/09/2015   Recommendations for Outpatient Follow-up:  Patient is advised to take prednisone 20 mg tablets 2 daily for 7 days to follow-up my office in 7 days to have her sutures removed from right AKA while at rehabilitation pursue aggressive rehabilitation for ambulation and transferring weight and strengthening and to continue with opioid analgesia as prescribed and nebulizer therapy as prescribed Discharge Diagnoses:  Principal Problem:   CHF exacerbation ([place]) Active Problems:   Tachycardia   COPD (chronic obstructive pulmonary disease) ([place])   Essential hypertension, benign   Diabetes ([place])   S/P AKA (above knee amputation) unilateral ([facility])   PVD (peripheral vascular disease) ([place])   Hypoxia   CHF (congestive heart failure) ([place])  Discharge Condition: Good and improving  There were no vitals filed for this visit.  History of present illness:  65 year old white female with significant COPD hypertension hyperlipidemia for full arterial disease insulin-dependent diabetes hyperlipidemia had a recent AKA of her right lower extremity was sent to rehabilitation within 2 days range of motion became dyspneic coughing sputum found to have an acute exacerbation of chronic bronchitis she has an echocardiogram in the recent past revealing ejection fraction of 50% consideration was given to volume overload however she was treated for bronchospasm she responded she likewise was given physical therapy in hospital and seemed to do well and learning and is highly motivated to ambulate again and follow-up with physical therapy for prosthesis when the time is appropriate she does need removal of staples from her right AKA while in rehabilitation. On hospital she had hyperglycemia due to intravenous steroids for bronchospasm this was continued  and she was discharged on oral steroids and she had the addition of her long-term Lantus insulin 34 units at bedtime which helped achieve better glycemic control prior to discharge  Hospital Course:  See history of present illness above  Procedures:    Consultations:    Discharge Instructions  Discharge Instructions    Discharge instructions    Complete by:  As directed      Discharge patient    Complete by:  As directed             Medication List    STOP taking these medications        dextromethorphan-guaiFENesin 30-600 MG 12hr tablet  Commonly known as:  MUCINEX DM     oxyCODONE-acetaminophen 10-325 MG tablet  Commonly known as:  PERCOCET      TAKE these medications        albuterol 108 (90 Base) MCG/ACT inhaler  Commonly known as:  PROVENTIL HFA;VENTOLIN HFA  Inhale 2 puffs into the lungs every 6 (six) hours as needed for wheezing or shortness of breath.     ALPRAZolam 0.25 MG tablet  Commonly known as:  XANAX  Take 0.25 mg by mouth 4 (four) times daily as needed for anxiety.     aspirin 325 MG tablet  Take 1 tablet (325 mg total) by mouth daily.     atorvastatin 20 MG tablet  Commonly known as:  LIPITOR  Take 1 tablet (20 mg total) by mouth daily at 6 PM.     citalopram 20 MG tablet  Commonly known as:  CELEXA  Take 20 mg by mouth daily.     ferrous sulfate 325 (65 FE) MG tablet  Take 1 tablet (325 mg total) by  mouth daily with breakfast.     gabapentin 300 MG capsule  Commonly known as:  NEURONTIN  Take 300 mg by mouth 3 (three) times daily.     ipratropium-albuterol 0.5-2.5 (3) MG/3ML Soln  Commonly known as:  DUONEB  Take 3 mLs by nebulization every 8 (eight) hours.     LANTUS SOLOSTAR 100 UNIT/ML Solostar Pen  Generic drug:  Insulin Glargine  Inject 34 Units into the skin at bedtime.     lisinopril 20 MG tablet  Commonly known as:  PRINIVIL,ZESTRIL  Take 1 tablet (20 mg total) by mouth daily.     metFORMIN 500 MG tablet  Commonly  known as:  GLUCOPHAGE  Take 500 mg by mouth 2 (two) times daily with a meal.     metoprolol tartrate 25 MG tablet  Commonly known as:  LOPRESSOR  Take 25 mg by mouth 2 (two) times daily.     naproxen 500 MG tablet  Commonly known as:  NAPROSYN  Take 500 mg by mouth 2 (two) times daily as needed for mild pain or moderate pain.     Oxycodone HCl 10 MG Tabs  Take 1 tablet (10 mg total) by mouth every 4 (four) hours as needed for moderate pain.     pantoprazole 40 MG tablet  Commonly known as:  PROTONIX  Take 40 mg by mouth daily.     predniSONE 20 MG tablet  Commonly known as:  DELTASONE  Take 1 tablet (20 mg total) by mouth 2 (two) times daily with a meal.     varenicline 0.5 MG tablet  Commonly known as:  CHANTIX  Take 2 tablets (1 mg total) by mouth 2 (two) times daily.       Allergies  Allergen Reactions  . Codeine Shortness Of Breath  . Penicillins Other (See Comments)    Has patient had a PCN reaction causing immediate rash, facial/tongue/throat swelling, SOB or lightheadedness with hypotension: unknown Has patient had a PCN reaction causing severe rash involving mucus membranes or skin necrosis: unknown Has patient had a PCN reaction that required hospitalization unknown Has patient had a PCN reaction occurring within the last 10 years: unknown If all of the above answers are "NO", then may proceed with Cephalosporin use.  . Ciprofloxacin Hives      The results of significant diagnostics from this hospitalization (including imaging, microbiology, ancillary and laboratory) are listed below for reference.    Significant Diagnostic Studies: Dg Chest 2 View  11/03/2015  CLINICAL DATA:  SOB, sick, faint episode earlier today, pt was transferred to the [facility] today., pt had amputation of leg on Monday 10/30/15 EXAM: CHEST  2 VIEW COMPARISON:  A 08/17/2015 FINDINGS: Cardiac silhouette is mildly enlarged. No mediastinal or hilar masses or evidence of adenopathy. The  thickening of the interstitial markings most the lung bases, stable prior study. Small bilateral pleural effusions are also stable. No new lung opacities. No pneumothorax the bony thorax is demineralized grossly intact. IMPRESSION: 1. Stable appearance the prior study. 2. Mild cardiomegaly, interstitial thickening and small effusions. Mild chronic congestive heart failure suspected. Electronically Signed   By: Lajean Manes M.D.   On: 11/03/2015 18:40   Ct Angio Chest Pe W/cm &/or Wo Cm  11/03/2015  CLINICAL DATA:  Sharp, intermittent low back pain. Wheezing. Shortness of breath. EXAM: CT ANGIOGRAPHY CHEST WITH CONTRAST TECHNIQUE: Multidetector CT imaging of the chest was performed using the standard protocol during bolus administration of intravenous contrast. Multiplanar CT image reconstructions  and MIPs were obtained to evaluate the vascular anatomy. CONTRAST:  1109mL OMNIPAQUE IOHEXOL 350 MG/ML SOLN COMPARISON:  Chest radiographs obtained earlier today. FINDINGS: Mediastinum/Lymph Nodes: Mildly enlarged AP window lymph node with a short axis diameter of 12 mm on image number 34 of series 4. Enlarged subcarinal node with a short axis diameter of 15 mm on image number 54. Enlarged left hilar node with a short axis diameter of 12 mm on image number 53. No axillary or upper abdominal adenopathy. Small paraesophageal varices. Normally opacified pulmonary arteries with no pulmonary arterial filling defects. No aortic aneurysm or dissection. Lungs/Pleura: Small to moderate-sized bilateral pleural effusions. Bilateral lower lobe compressive atelectasis. 11 mm sub solid nodule in the inferior right upper lobe on image number 43. This appears more linear in the coronal and sagittal planes. 7 mm sub solid nodule in the inferior, posterior aspect of the left upper lobe on image number 49. Diffuse peribronchial thickening and prominence of the interstitial markings. Upper abdomen: Prominent lateral segment of the left lobe  of the liver and prominent caudate lobe of the liver. The liver contours are mildly lobulated. Musculoskeletal: Thoracic spine degenerative changes and changes of DISH. Review of the MIP images confirms the above findings. IMPRESSION: 1. No pulmonary emboli. 2. Small to moderate-sized bilateral pleural effusions with bilateral lower lobe atelectasis. 3. Probable combination of chronic interstitial lung disease and mild interstitial pulmonary edema. 4. Single subsolid nodule in each upper lobe, as described above. These could represent small foci of the infection or scarring. Neoplastic processes are less likely. Recommend a followup chest CT with contrast in 6-12 months. 5. Possible changes of cirrhosis of the liver with small esophageal varices. 6. Probable reactive mediastinal and left hilar adenopathy. Electronically Signed   By: Claudie Revering M.D.   On: 11/03/2015 22:21    Microbiology: Recent Results (from the past 240 hour(s))  MRSA PCR Screening     Status: None   Collection Time: 11/04/15  4:25 AM  Result Value Ref Range Status   MRSA by PCR NEGATIVE NEGATIVE Final    Comment:        The GeneXpert MRSA Assay (FDA approved for NASAL specimens only), is one component of a comprehensive MRSA colonization surveillance program. It is not intended to diagnose MRSA infection nor to guide or monitor treatment for MRSA infections.      Labs: Basic Metabolic Panel:  Recent Labs Lab 11/03/15 0400 11/05/15 0733 11/06/15 0714 11/07/15 0627  NA 143 136 137 137  K 4.8 4.1 3.9 3.4*  CL 104 94* 90* 90*  CO2 30 34* 37* 37*  GLUCOSE 75 341* 458* 374*  BUN 18 24* 32* 34*  CREATININE 0.91 0.86 0.96 0.95  CALCIUM 9.2 8.5* 8.6* 8.7*   Liver Function Tests:  Recent Labs Lab 11/04/15 0838 11/05/15 0733 11/06/15 0714 11/07/15 0627  AST 21 16 13* 16  ALT 15 17 13* 15  ALKPHOS 79 77 71 64  BILITOT 0.7 0.5 0.5 0.7  PROT 6.5 6.5 6.5 6.3*  ALBUMIN 2.9* 3.0* 3.2* 3.2*   No results for  input(s): LIPASE, AMYLASE in the last 168 hours. No results for input(s): AMMONIA in the last 168 hours. CBC:  Recent Labs Lab 11/03/15 0400 11/05/15 0733  WBC 8.7 7.5  HGB 9.0* 9.8*  HCT 29.9* 32.0*  MCV 86.7 83.3  PLT 371 431*   Cardiac Enzymes:  Recent Labs Lab 11/03/15 1851  TROPONINI <0.03   BNP: BNP (last 3 results)  Recent  Labs  08/17/15 2145 11/03/15 1851  BNP 961.0* 838.0*    ProBNP (last 3 results) No results for input(s): PROBNP in the last 8760 hours.  CBG:  Recent Labs Lab 11/07/15 1948 11/08/15 0738 11/08/15 1143 11/08/15 1621 11/08/15 2140  GLUCAP 341* 209* 399* 327* 310*       Signed:  Hatim Homann M  [facility] Pager: [phone] 11/09/2015, 6:53 AM

## 2015-11-13 ENCOUNTER — Encounter (HOSPITAL_COMMUNITY): Payer: Self-pay | Admitting: Emergency Medicine

## 2015-11-13 ENCOUNTER — Emergency Department (HOSPITAL_COMMUNITY): Payer: Medicare Other

## 2015-11-13 ENCOUNTER — Inpatient Hospital Stay (HOSPITAL_COMMUNITY)
Admission: EM | Admit: 2015-11-13 | Discharge: 2015-11-17 | DRG: 871 | Disposition: A | Discharge status: Home Health | Payer: Medicare Other | Attending: Family Medicine | Admitting: Family Medicine

## 2015-11-13 DIAGNOSIS — R000 Tachycardia, unspecified: Secondary | ICD-10-CM

## 2015-11-13 DIAGNOSIS — I9589 Other hypotension: Secondary | ICD-10-CM

## 2015-11-13 DIAGNOSIS — Z794 Long term (current) use of insulin: Secondary | ICD-10-CM

## 2015-11-13 DIAGNOSIS — Z823 Family history of stroke: Secondary | ICD-10-CM

## 2015-11-13 DIAGNOSIS — Z809 Family history of malignant neoplasm, unspecified: Secondary | ICD-10-CM

## 2015-11-13 DIAGNOSIS — L8990 Pressure ulcer of unspecified site, unspecified stage: Secondary | ICD-10-CM | POA: Insufficient documentation

## 2015-11-13 DIAGNOSIS — J189 Pneumonia, unspecified organism: Secondary | ICD-10-CM

## 2015-11-13 DIAGNOSIS — Z89611 Acquired absence of right leg above knee: Secondary | ICD-10-CM

## 2015-11-13 DIAGNOSIS — I998 Other disorder of circulatory system: Secondary | ICD-10-CM

## 2015-11-13 DIAGNOSIS — Z8249 Family history of ischemic heart disease and other diseases of the circulatory system: Secondary | ICD-10-CM

## 2015-11-13 DIAGNOSIS — J410 Simple chronic bronchitis: Secondary | ICD-10-CM

## 2015-11-13 DIAGNOSIS — R0602 Shortness of breath: Secondary | ICD-10-CM | POA: Diagnosis not present

## 2015-11-13 DIAGNOSIS — I10 Essential (primary) hypertension: Secondary | ICD-10-CM

## 2015-11-13 DIAGNOSIS — A419 Sepsis, unspecified organism: Secondary | ICD-10-CM | POA: Diagnosis not present

## 2015-11-13 DIAGNOSIS — E119 Type 2 diabetes mellitus without complications: Secondary | ICD-10-CM | POA: Diagnosis present

## 2015-11-13 DIAGNOSIS — J441 Chronic obstructive pulmonary disease with (acute) exacerbation: Secondary | ICD-10-CM

## 2015-11-13 DIAGNOSIS — I739 Peripheral vascular disease, unspecified: Secondary | ICD-10-CM

## 2015-11-13 DIAGNOSIS — J449 Chronic obstructive pulmonary disease, unspecified: Secondary | ICD-10-CM | POA: Diagnosis present

## 2015-11-13 DIAGNOSIS — Z87891 Personal history of nicotine dependence: Secondary | ICD-10-CM

## 2015-11-13 DIAGNOSIS — R509 Fever, unspecified: Secondary | ICD-10-CM

## 2015-11-13 DIAGNOSIS — Z89619 Acquired absence of unspecified leg above knee: Secondary | ICD-10-CM

## 2015-11-13 DIAGNOSIS — G546 Phantom limb syndrome with pain: Secondary | ICD-10-CM

## 2015-11-13 DIAGNOSIS — L8993 Pressure ulcer of unspecified site, stage 3: Secondary | ICD-10-CM | POA: Diagnosis present

## 2015-11-13 DIAGNOSIS — Z880 Allergy status to penicillin: Secondary | ICD-10-CM

## 2015-11-13 DIAGNOSIS — D72829 Elevated white blood cell count, unspecified: Secondary | ICD-10-CM | POA: Diagnosis present

## 2015-11-13 DIAGNOSIS — J09X2 Influenza due to identified novel influenza A virus with other respiratory manifestations: Secondary | ICD-10-CM | POA: Diagnosis present

## 2015-11-13 DIAGNOSIS — K219 Gastro-esophageal reflux disease without esophagitis: Secondary | ICD-10-CM | POA: Diagnosis present

## 2015-11-13 DIAGNOSIS — E0810 Diabetes mellitus due to underlying condition with ketoacidosis without coma: Secondary | ICD-10-CM

## 2015-11-13 DIAGNOSIS — Z8673 Personal history of transient ischemic attack (TIA), and cerebral infarction without residual deficits: Secondary | ICD-10-CM

## 2015-11-13 DIAGNOSIS — F419 Anxiety disorder, unspecified: Secondary | ICD-10-CM | POA: Diagnosis present

## 2015-11-13 DIAGNOSIS — E785 Hyperlipidemia, unspecified: Secondary | ICD-10-CM | POA: Diagnosis present

## 2015-11-13 HISTORY — DX: Heart failure, unspecified: I50.9

## 2015-11-13 HISTORY — DX: Hypoxemia: R09.02

## 2015-11-13 LAB — URINE MICROSCOPIC-ADD ON

## 2015-11-13 LAB — COMPREHENSIVE METABOLIC PANEL
ALBUMIN: 3.7 g/dL (ref 3.5–5.0)
ALK PHOS: 66 U/L (ref 38–126)
ALT: 16 U/L (ref 14–54)
AST: 15 U/L (ref 15–41)
Anion gap: 10 (ref 5–15)
BILIRUBIN TOTAL: 0.8 mg/dL (ref 0.3–1.2)
BUN: 23 mg/dL — ABNORMAL HIGH (ref 6–20)
CALCIUM: 8.6 mg/dL — AB (ref 8.9–10.3)
CO2: 33 mmol/L — AB (ref 22–32)
CREATININE: 0.87 mg/dL (ref 0.44–1.00)
Chloride: 91 mmol/L — ABNORMAL LOW (ref 101–111)
GFR calc Af Amer: >60 mL/min (ref >60)
GFR calc non Af Amer: >60 mL/min (ref >60)
GLUCOSE: 289 mg/dL — AB (ref 65–99)
Potassium: 4.3 mmol/L (ref 3.5–5.1)
SODIUM: 134 mmol/L — AB (ref 135–145)
TOTAL PROTEIN: 7 g/dL (ref 6.5–8.1)

## 2015-11-13 LAB — URINALYSIS, ROUTINE W REFLEX MICROSCOPIC
BILIRUBIN URINE: NEGATIVE
Glucose, UA: >1000 mg/dL — AB
Ketones, ur: NEGATIVE mg/dL
LEUKOCYTES UA: NEGATIVE
NITRITE: NEGATIVE
SPECIFIC GRAVITY, URINE: 1.025 (ref 1.005–1.030)
pH: 5.5 (ref 5.0–8.0)

## 2015-11-13 LAB — I-STAT CG4 LACTIC ACID, ED: LACTIC ACID, VENOUS: 2.66 mmol/L — AB (ref 0.5–2.0)

## 2015-11-13 LAB — CBC WITH DIFFERENTIAL/PLATELET
BASOS PCT: 0 %
Basophils Absolute: 0 10*3/uL (ref 0.0–0.1)
EOS ABS: 0.1 10*3/uL (ref 0.0–0.7)
EOS PCT: 0 %
HCT: 41.4 % (ref 36.0–46.0)
Hemoglobin: 12.6 g/dL (ref 12.0–15.0)
Lymphocytes Relative: 11 %
Lymphs Abs: 1.8 10*3/uL (ref 0.7–4.0)
MCH: 26.4 pg (ref 26.0–34.0)
MCHC: 30.4 g/dL (ref 30.0–36.0)
MCV: 86.6 fL (ref 78.0–100.0)
MONO ABS: 1.2 10*3/uL — AB (ref 0.1–1.0)
MONOS PCT: 7 %
Neutro Abs: 14.1 10*3/uL — ABNORMAL HIGH (ref 1.7–7.7)
Neutrophils Relative %: 82 %
PLATELETS: 333 10*3/uL (ref 150–400)
RBC: 4.78 MIL/uL (ref 3.87–5.11)
RDW: 19.2 % — AB (ref 11.5–15.5)
WBC: 17.2 10*3/uL — ABNORMAL HIGH (ref 4.0–10.5)

## 2015-11-13 MED ORDER — SODIUM CHLORIDE 0.9 % IV BOLUS (SEPSIS)
1000.0000 mL | INTRAVENOUS | Status: AC
  Administered 2015-11-14 (×2): 1000 mL via INTRAVENOUS

## 2015-11-13 MED ORDER — VANCOMYCIN HCL IN DEXTROSE 1-5 GM/200ML-% IV SOLN
1000.0000 mg | Freq: Once | INTRAVENOUS | Status: AC
  Administered 2015-11-14: 1000 mg via INTRAVENOUS
  Filled 2015-11-13: qty 200

## 2015-11-13 MED ORDER — ACETAMINOPHEN 500 MG PO TABS
ORAL_TABLET | ORAL | Status: AC
  Filled 2015-11-13: qty 2

## 2015-11-13 MED ORDER — DEXTROSE 5 % IV SOLN
2.0000 g | Freq: Once | INTRAVENOUS | Status: AC
  Administered 2015-11-14: 2 g via INTRAVENOUS
  Filled 2015-11-13: qty 2

## 2015-11-13 MED ORDER — SODIUM CHLORIDE 0.9 % IV BOLUS (SEPSIS)
500.0000 mL | INTRAVENOUS | Status: AC
  Administered 2015-11-14: 500 mL via INTRAVENOUS

## 2015-11-13 MED ORDER — ACETAMINOPHEN 500 MG PO TABS
1000.0000 mg | ORAL_TABLET | Freq: Once | ORAL | Status: AC
  Administered 2015-11-13: 1000 mg via ORAL

## 2015-11-13 NOTE — ED Provider Notes (Signed)
CSN: [id number]     Arrival date & time 11/13/15  2251 History  By signing my name below, I, Doran Stabler, attest that this documentation has been prepared under the direction and in the presence of Lucia Gaskins, MD at 2304 Electronically Signed: Doran Stabler, ED Scribe. 11/14/2015. 11:15 PM.    Chief Complaint  Patient presents with  . Code Sepsis   Level 5 Caveat due to severe illness  The history is provided by the patient. No language interpreter was used.   HPI Comments: [name] is a 65 y.o. female here from [place] with a PMHx of HTN, COPD, and GERD who presents to the Emergency Department complaining of generalized fatigue that began 1 day ago. She states she just didn't feel good and didn't want to do anything. Pt also reports cough for a few days, chills, intermittent SOB and nausea. Pt denies vomiting, CP, dizziness, lightheadedness, or any other symptoms at this time. Pt is currently not living at home with her husband since she is in rehab for a right AKA for the past 2 weeks. Pt  smoked .5 pack a day prior to her AKA. She states the doctor told her she had to quit.  PCP Dr Cindie Laroche  Past Medical History  Diagnosis Date  . Hypertension   . COPD (chronic obstructive pulmonary disease) ([place])   . Stroke ([place])   . Chronic lower back pain   . Shingles Dec. 2016    Scalp  . Peripheral vascular disease ([place])   . GERD (gastroesophageal reflux disease)   . Hyperlipidemia   . Anxiety   . Type II diabetes mellitus ([place])   . Arthritis     "back" (10/30/2015)  . CHF (congestive heart failure) ([place])   . Hypoxia    Past Surgical History  Procedure Laterality Date  . Posterior lumbar fusion    . Foot surgery Bilateral     "from standing on concrete all day"  . Peripheral vascular catheterization N/A 04/10/2015    Procedure: Abdominal Aortogram w/Lower Extremity;  Surgeon: Conrad Crandon Lakes, MD;  Location: [facility];  Service: Cardiovascular;  Laterality: N/A;  .  Thrombectomy femoral artery Right 04/10/2015    Procedure: THROMBECTOMY RIGHT TIBIAL  ARTERY WITH VEIN PATCH ANGIOPLASTY;  Surgeon: Elam Dutch, MD;  Location: [facility];  Service: Vascular;  Laterality: Right;  . Fasciotomy Right 04/10/2015    Procedure: FOUR COMPARTMENT FASCIOTOMY;  Surgeon: Elam Dutch, MD;  Location: [facility];  Service: Vascular;  Laterality: Right;  . Femoral-popliteal bypass graft Right 04/10/2015    Procedure: RIGHT  FEMORAL-BELOW THE KNEE POPLITEAL ARTERY BYPASS GRAFT USING 6 MM X 80 CM PROPATEN GRAFT;  Surgeon: Elam Dutch, MD;  Location: [facility];  Service: Vascular;  Laterality: Right;  . Intraoperative arteriogram Right 04/10/2015    Procedure: INTRA OPERATIVE ARTERIOGRAM;  Surgeon: Elam Dutch, MD;  Location: [place];  Service: Vascular;  Laterality: Right;  . Carotid endarterectomy Right 2010    Dr Kellie Simmering   . Peripheral vascular catheterization N/A 10/20/2015    Procedure: Abdominal Aortogram;  Surgeon: Elam Dutch, MD;  Location: [facility];  Service: Cardiovascular;  Laterality: N/A;  . Laparoscopic cholecystectomy    . Back surgery    . Carotid endarterectomy Left 03/2008    Archie Endo 01/15/2011  . Shoulder open rotator cuff repair Right 02/2006    Archie Endo 01/29/2011  . Cardiac catheterization    . Leg amputation through knee Right  10/30/2015  . Amputation Right 10/30/2015    Procedure: RIGHT ABOVE KNEE AMPUTATION;  Surgeon: Elam Dutch, MD;  Location: [facility] OR;  Service: Vascular;  Laterality: Right;   Family History  Problem Relation Age of Onset  . Cancer Father   . Heart attack Sister   . Stroke Sister   . Hypertension Sister   . Hypertension Brother    Social History  Substance Use Topics  . Smoking status: Former Smoker -- 0.12 packs/day for 49 years    Types: Cigarettes  . Smokeless tobacco: Never Used  . Alcohol Use: No   Was living at home until 2 weeks ago when she went to rehabilitation Lives with spouse  OB History     Gravida Para Term Preterm AB TAB SAB Ectopic Multiple Living   3 3 3       3      Review of Systems  Unable to perform ROS: Unstable vital signs  Constitutional: Positive for chills and fatigue.  Respiratory: Positive for cough and shortness of breath.   Cardiovascular: Negative for chest pain.  Gastrointestinal: Positive for nausea. Negative for vomiting and diarrhea.  Neurological: Negative for dizziness and light-headedness.  All other systems reviewed and are negative.   Allergies  Codeine; Penicillins; and Ciprofloxacin  Home Medications   Prior to Admission medications   Medication Sig Start Date End Date Taking? Authorizing Provider  albuterol (PROVENTIL HFA;VENTOLIN HFA) 108 (90 Base) MCG/ACT inhaler Inhale 2 puffs into the lungs every 6 (six) hours as needed for wheezing or shortness of breath.   Yes Historical Provider, MD  ALPRAZolam (XANAX) 0.25 MG tablet Take 0.25 mg by mouth 4 (four) times daily as needed for anxiety.    Yes Historical Provider, MD  aspirin 325 MG tablet Take 1 tablet (325 mg total) by mouth daily. 04/15/15  Yes Thurnell Lose, MD  atorvastatin (LIPITOR) 20 MG tablet Take 1 tablet (20 mg total) by mouth daily at 6 PM. 04/15/15  Yes Thurnell Lose, MD  citalopram (CELEXA) 20 MG tablet Take 20 mg by mouth daily. 07/04/13  Yes Historical Provider, MD  ferrous sulfate 325 (65 FE) MG tablet Take 1 tablet (325 mg total) by mouth daily with breakfast. 11/09/15  Yes Lucia Gaskins, MD  gabapentin (NEURONTIN) 300 MG capsule Take 300 mg by mouth 3 (three) times daily.   Yes Historical Provider, MD  insulin aspart (NOVOLOG) 100 UNIT/ML injection Inject 2-10 Units into the skin 3 (three) times daily before meals. 151-200= 2 units 201-250= 4 units 251-300= 6 units 301-350= 8 units 351-400=10units If greater than 400-Call NP/Dr   Yes Historical Provider, MD  Insulin Glargine (LANTUS SOLOSTAR) 100 UNIT/ML Solostar Pen Inject 34 Units into the skin at bedtime.   Yes  Historical Provider, MD  ipratropium-albuterol (DUONEB) 0.5-2.5 (3) MG/3ML SOLN Take 3 mLs by nebulization every 8 (eight) hours.    Yes Historical Provider, MD  lisinopril (PRINIVIL,ZESTRIL) 20 MG tablet Take 1 tablet (20 mg total) by mouth daily. 11/04/15  Yes Alvia Grove, PA-C  metFORMIN (GLUCOPHAGE) 500 MG tablet Take 500 mg by mouth 2 (two) times daily with a meal.   Yes Historical Provider, MD  metoprolol tartrate (LOPRESSOR) 25 MG tablet Take 25 mg by mouth 2 (two) times daily.    Yes Historical Provider, MD  naproxen (NAPROSYN) 500 MG tablet Take 500 mg by mouth 2 (two) times daily as needed for mild pain or moderate pain.  03/16/15  Yes Historical Provider, MD  oxyCODONE 10 MG TABS Take 1 tablet (10 mg total) by mouth every 4 (four) hours as needed for moderate pain. 11/09/15  Yes Lucia Gaskins, MD  pantoprazole (PROTONIX) 40 MG tablet Take 40 mg by mouth daily.   Yes Historical Provider, MD  predniSONE (DELTASONE) 20 MG tablet Take 1 tablet (20 mg total) by mouth 2 (two) times daily with a meal. 11/09/15  Yes Lucia Gaskins, MD  varenicline (CHANTIX) 0.5 MG tablet Take 2 tablets (1 mg total) by mouth 2 (two) times daily. 08/23/15  Yes Richard Cindie Laroche, MD   BP 92/56 mmHg  Pulse 72  Temp(Src) 100.1 F (37.8 C) (Oral)  Resp 17  Ht 5\' 6"  (1.676 m)  Wt 171 lb 4.8 oz (77.7 kg)  BMI 27.66 kg/m2  SpO2 99%   Physical Exam  Constitutional: She is oriented to person, place, and time. She appears well-developed and well-nourished.  Non-toxic appearance. She does not appear ill. She appears distressed.  HENT:  Head: Normocephalic and atraumatic.  Right Ear: External ear normal.  Left Ear: External ear normal.  Nose: Nose normal. No mucosal edema or rhinorrhea.  Mouth/Throat: Oropharynx is clear and moist. Mucous membranes are dry (tongue). No dental abscesses or uvula swelling.  Eyes: Conjunctivae and EOM are normal. Pupils are equal, round, and reactive to light.  Neck: Normal range  of motion and full passive range of motion without pain. Neck supple.  Cardiovascular: Regular rhythm and normal heart sounds.  Tachycardia present.  Exam reveals no gallop and no friction rub.   No murmur heard. Pulmonary/Chest: Tachypnea noted. She is in respiratory distress. She has decreased breath sounds. She has wheezes. She has no rhonchi. She has no rales. She exhibits no tenderness and no crepitus.  Coughing noted on exam  Abdominal: Soft. Normal appearance and bowel sounds are normal. She exhibits no distension. There is no tenderness. There is no rebound and no guarding.  Musculoskeletal: She exhibits no edema or tenderness.  Right AKA  Neurological: She is alert and oriented to person, place, and time. She has normal strength. No cranial nerve deficit.  Skin: Skin is warm, dry and intact. No rash noted. No erythema. There is pallor.  Psychiatric: She has a normal mood and affect. Her speech is normal and behavior is normal. Her mood appears not anxious.  Nursing note and vitals reviewed.   ED Course  Procedures . Medications  acetaminophen (TYLENOL) tablet 1,000 mg (1,000 mg Oral Given 11/13/15 2300)  sodium chloride 0.9 % bolus 1,000 mL (0 mLs Intravenous Stopped 11/14/15 0133)    Followed by  sodium chloride 0.9 % bolus 500 mL (0 mLs Intravenous Stopped 11/14/15 0251)  aztreonam (AZACTAM) 2 g in dextrose 5 % 50 mL IVPB (0 g Intravenous Stopped 11/14/15 0055)  vancomycin (VANCOCIN) IVPB 1000 mg/200 mL premix (0 mg Intravenous Stopped 11/14/15 0203)  ipratropium (ATROVENT) 0.02 % nebulizer solution (0.5 mg  Given 11/14/15 0207)  levalbuterol (XOPENEX) 1.25 MG/0.5ML nebulizer solution (1.25 mg  Given 11/14/15 0207)  metoprolol (LOPRESSOR) injection 5 mg (5 mg Intravenous Given 11/14/15 0213)  methylPREDNISolone sodium succinate (SOLU-MEDROL) 125 mg/2 mL injection 125 mg (125 mg Intravenous Given 11/14/15 0213)     DIAGNOSTIC STUDIES: Oxygen Saturation is 96% on [place], normal by my  interpretation.    COORDINATION OF CARE: 11:05 PM Will give fluids, Azactam, Vancomycin, and Tylenol. Will order CXR, blood work, and urinalysis. Discussed treatment plan with pt at bedside and pt agreed to plan.  Patient's oral temp was  normal at 97 however rectal temp showed she had a fever of 102.9. She was given Tylenol for her fever. She was started on sepsis protocol and got 2500 mL bolus of fluid. She was given a albuterol nebulizer treatment. She was started on vancomycin and Azactam. She was allergic to Cipro and therefore she was not given Levaquin.  Patient's tachycardia improved with the IV fluids. Her temperature improved with the Tylenol. When I rechecked her around 1 AM she states she was feeling better however she still noted to be very tachypneic. We discussed she needs to be admitted to the hospital she is agreeable. Patient did have a period of time where she was hypotensive however that did improve with the IV fluids.  01:34 Dr Marin Comment will admit and do her orders  Review of her chart shows she had her AKA amputation done on February 13 by Dr. Oneida Alar, vascular surgeon.  Labs Review Results for orders placed or performed during the hospital encounter of 11/13/15  Culture, blood (routine x 2)  Result Value Ref Range   Specimen Description RIGHT ANTECUBITAL    Special Requests BOTTLES DRAWN AEROBIC AND ANAEROBIC 8CC EACH    Culture PENDING    Report Status PENDING   Culture, blood (routine x 2)  Result Value Ref Range   Specimen Description BLOOD LEFT FOREARM    Special Requests BOTTLES DRAWN AEROBIC AND ANAEROBIC [facility] EACH    Culture PENDING    Report Status PENDING   Comprehensive metabolic panel  Result Value Ref Range   Sodium 134 (L) 135 - 145 mmol/L   Potassium 4.3 3.5 - 5.1 mmol/L   Chloride 91 (L) 101 - 111 mmol/L   CO2 33 (H) 22 - 32 mmol/L   Glucose, Bld 289 (H) 65 - 99 mg/dL   BUN 23 (H) 6 - 20 mg/dL   Creatinine, Ser 0.87 0.44 - 1.00 mg/dL   Calcium 8.6 (L)  8.9 - 10.3 mg/dL   Total Protein 7.0 6.5 - 8.1 g/dL   Albumin 3.7 3.5 - 5.0 g/dL   AST 15 15 - 41 U/L   ALT 16 14 - 54 U/L   Alkaline Phosphatase 66 38 - 126 U/L   Total Bilirubin 0.8 0.3 - 1.2 mg/dL   GFR calc non Af Amer >60 >60 mL/min   GFR calc Af Amer >60 >60 mL/min   Anion gap 10 5 - 15  CBC with Differential  Result Value Ref Range   WBC 17.2 (H) 4.0 - 10.5 K/uL   RBC 4.78 3.87 - 5.11 MIL/uL   Hemoglobin 12.6 12.0 - 15.0 g/dL   HCT 41.4 36.0 - 46.0 %   MCV 86.6 78.0 - 100.0 fL   MCH 26.4 26.0 - 34.0 pg   MCHC 30.4 30.0 - 36.0 g/dL   RDW 19.2 (H) 11.5 - 15.5 %   Platelets 333 150 - 400 K/uL   Neutrophils Relative % 82 %   Neutro Abs 14.1 (H) 1.7 - 7.7 K/uL   Lymphocytes Relative 11 %   Lymphs Abs 1.8 0.7 - 4.0 K/uL   Monocytes Relative 7 %   Monocytes Absolute 1.2 (H) 0.1 - 1.0 K/uL   Eosinophils Relative 0 %   Eosinophils Absolute 0.1 0.0 - 0.7 K/uL   Basophils Relative 0 %   Basophils Absolute 0.0 0.0 - 0.1 K/uL  Urinalysis, Routine w reflex microscopic (not at [facility])  Result Value Ref Range   Color, Urine YELLOW YELLOW   APPearance CLEAR CLEAR  Specific Gravity, Urine 1.025 1.005 - 1.030   pH 5.5 5.0 - 8.0   Glucose, UA >1000 (A) NEGATIVE mg/dL   Hgb urine dipstick TRACE (A) NEGATIVE   Bilirubin Urine NEGATIVE NEGATIVE   Ketones, ur NEGATIVE NEGATIVE mg/dL   Protein, ur TRACE (A) NEGATIVE mg/dL   Nitrite NEGATIVE NEGATIVE   Leukocytes, UA NEGATIVE NEGATIVE  Urine microscopic-add on  Result Value Ref Range   Squamous Epithelial / LPF 0-5 (A) NONE SEEN   WBC, UA 0-5 0 - 5 WBC/hpf   RBC / HPF 0-5 0 - 5 RBC/hpf   Bacteria, UA MANY (A) NONE SEEN  Glucose, capillary  Result Value Ref Range   Glucose-Capillary 229 (H) 65 - 99 mg/dL   Comment 1 Notify RN   I-Stat CG4 Lactic Acid, ED (Not at [facility])  Result Value Ref Range   Lactic Acid, Venous 2.66 (HH) 0.5 - 2.0 mmol/L   Comment NOTIFIED PHYSICIAN    Laboratory interpretation all normal except elevated  lactic acid, hyperglycemia, metabolic alkalosis, leukocytosis     Imaging Review Dg Chest Portable 1 View  11/13/2015  CLINICAL DATA:  Acute onset of fever and decreased O2 saturation. Initial encounter. EXAM: PORTABLE CHEST 1 VIEW COMPARISON:  Chest radiograph performed 11/03/2015 FINDINGS: The lungs are well-aerated. Mild vascular congestion is noted. Mild bibasilar atelectasis is seen. There is no evidence of pleural effusion or pneumothorax. The cardiomediastinal silhouette is borderline normal in size. No acute osseous abnormalities are seen. IMPRESSION: Mild vascular congestion noted.  Mild bibasilar atelectasis seen. Electronically Signed   By: Garald Balding M.D.   On: 11/13/2015 23:31   I have personally reviewed and evaluated these images and lab results as part of my medical decision-making.   EKG Interpretation   Date/Time:  Monday November 13 2015 22:56:58 EST Ventricular Rate:  155 PR Interval:  70 QRS Duration: 113 QT Interval:  303 QTC Calculation: 487 R Axis:   -83 Text Interpretation:  Sinus tachycardia Borderline IVCD with LAD ST  depression, probably rate related Borderline prolonged QT interval  Electrode noise Since last tracing rate faster 03 Nov 2015 Confirmed by  Tana Trefry  MD-I, Senia Even ([id number]) on 11/13/2015 11:42:15 PM      MDM   Final diagnoses:  Fever, unspecified fever cause  Tachycardia  Healthcare-associated pneumonia  Other specified hypotension  Sepsis, unspecified organism ([place])  COPD exacerbation ([place])   Plan admission  Rolland Porter, MD, [place] Performed by: Rolland Porter L Total critical care time: 40  minutes Critical care time was exclusive of separately billable procedures and treating other patients. Critical care was necessary to treat or prevent imminent or life-threatening deterioration. Critical care was time spent personally by me on the following activities: development of treatment plan with patient and/or surrogate as well  as nursing, discussions with consultants, evaluation of patient's response to treatment, examination of patient, obtaining history from patient or surrogate, ordering and performing treatments and interventions, ordering and review of laboratory studies, ordering and review of radiographic studies, pulse oximetry and re-evaluation of patient's condition.    I personally performed the services described in this documentation, which was scribed in my presence. The recorded information has been reviewed and considered.  Rolland Porter, MD, Barbette Or, MD 11/14/15 [phone]

## 2015-11-13 NOTE — ED Notes (Signed)
Pt sent over from [facility] with fever and lower o2 sat. Per [facility] pt o2 sat was 80% on room air.

## 2015-11-13 NOTE — ED Notes (Signed)
Patient had recent AKA X2 weeks. And is at [facility] for rehab. History of COPD, increased SOB tonight.

## 2015-11-13 NOTE — ED Notes (Signed)
Patient placed on bedpan at this time.

## 2015-11-14 ENCOUNTER — Encounter (HOSPITAL_COMMUNITY): Payer: Self-pay | Admitting: Internal Medicine

## 2015-11-14 DIAGNOSIS — K219 Gastro-esophageal reflux disease without esophagitis: Secondary | ICD-10-CM | POA: Diagnosis present

## 2015-11-14 DIAGNOSIS — Z809 Family history of malignant neoplasm, unspecified: Secondary | ICD-10-CM | POA: Diagnosis not present

## 2015-11-14 DIAGNOSIS — F419 Anxiety disorder, unspecified: Secondary | ICD-10-CM | POA: Diagnosis present

## 2015-11-14 DIAGNOSIS — L8990 Pressure ulcer of unspecified site, unspecified stage: Secondary | ICD-10-CM | POA: Insufficient documentation

## 2015-11-14 DIAGNOSIS — Z87891 Personal history of nicotine dependence: Secondary | ICD-10-CM | POA: Diagnosis not present

## 2015-11-14 DIAGNOSIS — Z89611 Acquired absence of right leg above knee: Secondary | ICD-10-CM

## 2015-11-14 DIAGNOSIS — A419 Sepsis, unspecified organism: Secondary | ICD-10-CM | POA: Diagnosis present

## 2015-11-14 DIAGNOSIS — J09X2 Influenza due to identified novel influenza A virus with other respiratory manifestations: Secondary | ICD-10-CM | POA: Diagnosis present

## 2015-11-14 DIAGNOSIS — J441 Chronic obstructive pulmonary disease with (acute) exacerbation: Secondary | ICD-10-CM | POA: Diagnosis present

## 2015-11-14 DIAGNOSIS — J438 Other emphysema: Secondary | ICD-10-CM

## 2015-11-14 DIAGNOSIS — E785 Hyperlipidemia, unspecified: Secondary | ICD-10-CM | POA: Diagnosis present

## 2015-11-14 DIAGNOSIS — L8993 Pressure ulcer of unspecified site, stage 3: Secondary | ICD-10-CM | POA: Diagnosis present

## 2015-11-14 DIAGNOSIS — Z8249 Family history of ischemic heart disease and other diseases of the circulatory system: Secondary | ICD-10-CM | POA: Diagnosis not present

## 2015-11-14 DIAGNOSIS — Z823 Family history of stroke: Secondary | ICD-10-CM | POA: Diagnosis not present

## 2015-11-14 DIAGNOSIS — Z880 Allergy status to penicillin: Secondary | ICD-10-CM | POA: Diagnosis not present

## 2015-11-14 DIAGNOSIS — I10 Essential (primary) hypertension: Secondary | ICD-10-CM | POA: Diagnosis present

## 2015-11-14 DIAGNOSIS — I739 Peripheral vascular disease, unspecified: Secondary | ICD-10-CM | POA: Diagnosis present

## 2015-11-14 DIAGNOSIS — Z8673 Personal history of transient ischemic attack (TIA), and cerebral infarction without residual deficits: Secondary | ICD-10-CM | POA: Diagnosis not present

## 2015-11-14 DIAGNOSIS — R0602 Shortness of breath: Secondary | ICD-10-CM | POA: Diagnosis present

## 2015-11-14 DIAGNOSIS — E119 Type 2 diabetes mellitus without complications: Secondary | ICD-10-CM | POA: Diagnosis present

## 2015-11-14 LAB — TSH: TSH: 0.965 u[IU]/mL (ref 0.350–4.500)

## 2015-11-14 LAB — INFLUENZA PANEL BY PCR (TYPE A & B)
H1N1 flu by pcr: NOT DETECTED
INFLAPCR: POSITIVE — AB
Influenza B By PCR: NEGATIVE

## 2015-11-14 LAB — GLUCOSE, CAPILLARY
GLUCOSE-CAPILLARY: 229 mg/dL — AB (ref 65–99)
GLUCOSE-CAPILLARY: 316 mg/dL — AB (ref 65–99)
GLUCOSE-CAPILLARY: 351 mg/dL — AB (ref 65–99)
Glucose-Capillary: 226 mg/dL — ABNORMAL HIGH (ref 65–99)
Glucose-Capillary: 186 mg/dL — ABNORMAL HIGH (ref 65–99)

## 2015-11-14 MED ORDER — ONDANSETRON HCL 4 MG/2ML IJ SOLN: 4.0000 mg | Freq: Four times a day (QID) | INTRAMUSCULAR | Status: DC | PRN

## 2015-11-14 MED ORDER — METHYLPREDNISOLONE SODIUM SUCC 125 MG IJ SOLR
60.0000 mg | Freq: Four times a day (QID) | INTRAMUSCULAR | Status: DC
  Administered 2015-11-14 – 2015-11-17 (×14): 60 mg via INTRAVENOUS
  Filled 2015-11-14 (×14): qty 2

## 2015-11-14 MED ORDER — METOPROLOL TARTRATE 1 MG/ML IV SOLN
5.0000 mg | Freq: Once | INTRAVENOUS | Status: AC
  Administered 2015-11-14: 5 mg via INTRAVENOUS
  Filled 2015-11-14: qty 5

## 2015-11-14 MED ORDER — GABAPENTIN 300 MG PO CAPS
300.0000 mg | ORAL_CAPSULE | Freq: Three times a day (TID) | ORAL | Status: DC
  Administered 2015-11-14 – 2015-11-17 (×9): 300 mg via ORAL
  Filled 2015-11-14 (×9): qty 1

## 2015-11-14 MED ORDER — METHYLPREDNISOLONE SODIUM SUCC 125 MG IJ SOLR
125.0000 mg | Freq: Once | INTRAMUSCULAR | Status: AC
  Administered 2015-11-14: 125 mg via INTRAVENOUS
  Filled 2015-11-14: qty 2

## 2015-11-14 MED ORDER — ALPRAZOLAM 0.25 MG PO TABS
0.2500 mg | ORAL_TABLET | Freq: Four times a day (QID) | ORAL | Status: DC | PRN
  Administered 2015-11-16 (×2): 0.25 mg via ORAL
  Filled 2015-11-14 (×2): qty 1

## 2015-11-14 MED ORDER — IPRATROPIUM-ALBUTEROL 0.5-2.5 (3) MG/3ML IN SOLN
3.0000 mL | Freq: Once | RESPIRATORY_TRACT | Status: DC
  Filled 2015-11-14: qty 3

## 2015-11-14 MED ORDER — ALBUTEROL SULFATE (2.5 MG/3ML) 0.083% IN NEBU
2.5000 mg | INHALATION_SOLUTION | Freq: Once | RESPIRATORY_TRACT | Status: DC
  Filled 2015-11-14: qty 3

## 2015-11-14 MED ORDER — PANTOPRAZOLE SODIUM 40 MG PO TBEC
40.0000 mg | DELAYED_RELEASE_TABLET | Freq: Every day | ORAL | Status: DC
  Administered 2015-11-14 – 2015-11-17 (×4): 40 mg via ORAL
  Filled 2015-11-14 (×4): qty 1

## 2015-11-14 MED ORDER — LEVALBUTEROL HCL 1.25 MG/0.5ML IN NEBU
INHALATION_SOLUTION | RESPIRATORY_TRACT | Status: AC
Start: 2015-11-14 — End: 2015-11-14
  Administered 2015-11-14: 1.25 mg
  Filled 2015-11-14: qty 0.5

## 2015-11-14 MED ORDER — HEPARIN SODIUM (PORCINE) 5000 UNIT/ML IJ SOLN
5000.0000 [IU] | Freq: Three times a day (TID) | INTRAMUSCULAR | Status: DC
  Administered 2015-11-14 – 2015-11-17 (×10): 5000 [IU] via SUBCUTANEOUS
  Filled 2015-11-14 (×10): qty 1

## 2015-11-14 MED ORDER — ASPIRIN 325 MG PO TABS
325.0000 mg | ORAL_TABLET | Freq: Every day | ORAL | Status: DC
  Administered 2015-11-14 – 2015-11-17 (×4): 325 mg via ORAL
  Filled 2015-11-14 (×4): qty 1

## 2015-11-14 MED ORDER — ATORVASTATIN CALCIUM 20 MG PO TABS
20.0000 mg | ORAL_TABLET | Freq: Every day | ORAL | Status: DC
  Administered 2015-11-14 – 2015-11-16 (×3): 20 mg via ORAL
  Filled 2015-11-14 (×3): qty 1

## 2015-11-14 MED ORDER — VANCOMYCIN HCL IN DEXTROSE 1-5 GM/200ML-% IV SOLN
1000.0000 mg | Freq: Two times a day (BID) | INTRAVENOUS | Status: DC
  Administered 2015-11-14 – 2015-11-17 (×7): 1000 mg via INTRAVENOUS
  Filled 2015-11-14 (×7): qty 200

## 2015-11-14 MED ORDER — ALBUTEROL SULFATE (2.5 MG/3ML) 0.083% IN NEBU: 5.0000 mg | INHALATION_SOLUTION | Freq: Once | RESPIRATORY_TRACT | Status: DC

## 2015-11-14 MED ORDER — DEXTROSE-NACL 5-0.9 % IV SOLN
INTRAVENOUS | Status: DC
  Administered 2015-11-14: 04:00:00 via INTRAVENOUS

## 2015-11-14 MED ORDER — OSELTAMIVIR PHOSPHATE 75 MG PO CAPS
75.0000 mg | ORAL_CAPSULE | Freq: Two times a day (BID) | ORAL | Status: DC
  Administered 2015-11-14 – 2015-11-17 (×7): 75 mg via ORAL
  Filled 2015-11-14 (×7): qty 1

## 2015-11-14 MED ORDER — CITALOPRAM HYDROBROMIDE 20 MG PO TABS
20.0000 mg | ORAL_TABLET | Freq: Every day | ORAL | Status: DC
  Administered 2015-11-14 – 2015-11-17 (×4): 20 mg via ORAL
  Filled 2015-11-14 (×4): qty 1

## 2015-11-14 MED ORDER — HYDROCORTISONE NA SUCCINATE PF 100 MG IJ SOLR
50.0000 mg | Freq: Three times a day (TID) | INTRAMUSCULAR | Status: AC
  Administered 2015-11-14 (×2): 50 mg via INTRAVENOUS
  Filled 2015-11-14 (×2): qty 2

## 2015-11-14 MED ORDER — DEXTROSE 5 % IV SOLN
2.0000 g | Freq: Three times a day (TID) | INTRAVENOUS | Status: DC
  Administered 2015-11-14 – 2015-11-17 (×10): 2 g via INTRAVENOUS
  Filled 2015-11-14 (×20): qty 2

## 2015-11-14 MED ORDER — SODIUM CHLORIDE 0.9 % IV BOLUS (SEPSIS)
500.0000 mL | Freq: Once | INTRAVENOUS | Status: AC
  Administered 2015-11-14: 500 mL via INTRAVENOUS

## 2015-11-14 MED ORDER — OXYCODONE HCL 5 MG PO TABS
10.0000 mg | ORAL_TABLET | ORAL | Status: DC | PRN
  Administered 2015-11-14 – 2015-11-17 (×11): 10 mg via ORAL
  Filled 2015-11-14 (×11): qty 2

## 2015-11-14 MED ORDER — METOPROLOL TARTRATE 25 MG PO TABS: 25.0000 mg | ORAL_TABLET | Freq: Two times a day (BID) | ORAL | Status: DC

## 2015-11-14 MED ORDER — IPRATROPIUM BROMIDE 0.02 % IN SOLN
RESPIRATORY_TRACT | Status: AC
  Administered 2015-11-14: 0.5 mg
  Filled 2015-11-14: qty 2.5

## 2015-11-14 MED ORDER — INSULIN ASPART 100 UNIT/ML ~~LOC~~ SOLN
0.0000 [IU] | SUBCUTANEOUS | Status: DC
  Administered 2015-11-14 (×2): 7 [IU] via SUBCUTANEOUS
  Administered 2015-11-14: 4 [IU] via SUBCUTANEOUS

## 2015-11-14 MED ORDER — ONDANSETRON HCL 4 MG PO TABS: 4.0000 mg | ORAL_TABLET | Freq: Four times a day (QID) | ORAL | Status: DC | PRN

## 2015-11-14 MED ORDER — SODIUM CHLORIDE 0.9% FLUSH
3.0000 mL | Freq: Two times a day (BID) | INTRAVENOUS | Status: DC
  Administered 2015-11-14 – 2015-11-17 (×5): 3 mL via INTRAVENOUS

## 2015-11-14 MED ORDER — INSULIN ASPART 100 UNIT/ML ~~LOC~~ SOLN
0.0000 [IU] | Freq: Three times a day (TID) | SUBCUTANEOUS | Status: DC
  Administered 2015-11-14: 15 [IU] via SUBCUTANEOUS
  Administered 2015-11-14: 20 [IU] via SUBCUTANEOUS
  Administered 2015-11-15: 7 [IU] via SUBCUTANEOUS
  Administered 2015-11-15: 11 [IU] via SUBCUTANEOUS
  Administered 2015-11-15: 15 [IU] via SUBCUTANEOUS
  Administered 2015-11-15: 23 [IU] via SUBCUTANEOUS
  Administered 2015-11-16: 7 [IU] via SUBCUTANEOUS
  Administered 2015-11-16: 15 [IU] via SUBCUTANEOUS
  Administered 2015-11-16 – 2015-11-17 (×3): 20 [IU] via SUBCUTANEOUS
  Administered 2015-11-17: 11 [IU] via SUBCUTANEOUS

## 2015-11-14 MED ORDER — IPRATROPIUM BROMIDE 0.02 % IN SOLN: 0.5000 mg | Freq: Once | RESPIRATORY_TRACT | Status: DC

## 2015-11-14 MED ORDER — LEVALBUTEROL HCL 0.63 MG/3ML IN NEBU
0.6300 mg | INHALATION_SOLUTION | Freq: Four times a day (QID) | RESPIRATORY_TRACT | Status: DC
  Administered 2015-11-14 – 2015-11-15 (×6): 0.63 mg via RESPIRATORY_TRACT
  Filled 2015-11-14 (×6): qty 3

## 2015-11-14 NOTE — Progress Notes (Signed)
Pharmacy Antibiotic Note  [name] is a 65 y.o. female admitted on 11/13/2015 with sepsis.  Pharmacy has been consulted for vancomycin and aztreonam dosing.  Plan: Continue vancomycin 1 gm IV q12 hours Cont aztreonam 2 gm IV q8 hours F/u renal function, cultures and clinical course  Height: 5\' 6"  (167.6 cm) Weight: 171 lb 4.8 oz (77.7 kg) IBW/kg (Calculated) : 59.3  Temp (24hrs), Avg:101.5 F (38.6 C), Min:100.1 F (37.8 C), Max:102.9 F (39.4 C)   Recent Labs Lab 11/13/15 2300 11/13/15 2330  WBC 17.2*  --   CREATININE 0.87  --   LATICACIDVEN  --  2.66*    Estimated Creatinine Clearance: 67.9 mL/min (by C-G formula based on Cr of 0.87).    Allergies  Allergen Reactions  . Codeine Shortness Of Breath  . Penicillins Other (See Comments)    Has patient had a PCN reaction causing immediate rash, facial/tongue/throat swelling, SOB or lightheadedness with hypotension: unknown Has patient had a PCN reaction causing severe rash involving mucus membranes or skin necrosis: unknown Has patient had a PCN reaction that required hospitalization unknown Has patient had a PCN reaction occurring within the last 10 years: unknown If all of the above answers are "NO", then may proceed with Cephalosporin use.  . Ciprofloxacin Hives    Antimicrobials this admission: vanc   2/28 >>  Aztreonam  2/28  >>    Thank you for allowing pharmacy to be a part of this patient's care.  Beverlee Nims 11/14/2015 7:32 AM

## 2015-11-14 NOTE — Progress Notes (Signed)
Patient readmitted with influenza A significant respiratory distress at present patient placed on dual antibiotics for H CAP Anka and aztreonam for chest x-ray revealing linear atelectasis will add Tamiflu [name] [id number] DOB: [birth year] DOA: 11/13/2015 PCP: Debra Curet, MD             Physical Exam: Blood pressure 95/47, pulse 125, temperature 97.1 F (36.2 C), temperature source Oral, resp. rate 20, height 5\' 6"  (1.676 m), weight 171 lb 4.8 oz (77.7 kg), SpO2 100 %. lungs diminished breath sounds in the bases mild end expiratory wheeze no rales no rhonchi appreciated heart regular rhythm no S3-S4 no heaves thrills rubs   Investigations:  Recent Results (from the past 240 hour(s))  Culture, blood (routine x 2)     Status: None (Preliminary result)   Collection Time: 11/13/15 11:10 PM  Result Value Ref Range Status   Specimen Description RIGHT ANTECUBITAL  Final   Special Requests BOTTLES DRAWN AEROBIC AND ANAEROBIC 8CC EACH  Final   Culture NO GROWTH < 12 HOURS  Final   Report Status PENDING  Incomplete  Culture, blood (routine x 2)     Status: None (Preliminary result)   Collection Time: 11/13/15 11:10 PM  Result Value Ref Range Status   Specimen Description BLOOD LEFT FOREARM  Final   Special Requests BOTTLES DRAWN AEROBIC AND ANAEROBIC 6CC EACH  Final   Culture NO GROWTH < 12 HOURS  Final   Report Status PENDING  Incomplete     Basic Metabolic Panel:  Recent Labs  11/13/15 2300  NA 134*  K 4.3  CL 91*  CO2 33*  GLUCOSE 289*  BUN 23*  CREATININE 0.87  CALCIUM 8.6*   Liver Function Tests:  Recent Labs  11/13/15 2300  AST 15  ALT 16  ALKPHOS 66  BILITOT 0.8  PROT 7.0  ALBUMIN 3.7     CBC:  Recent Labs  11/13/15 2300  WBC 17.2*  NEUTROABS 14.1*  HGB 12.6  HCT 41.4  MCV 86.6  PLT 333    Dg Chest Portable 1 View  11/13/2015  CLINICAL DATA:  Acute onset of fever and decreased O2 saturation. Initial encounter. EXAM: PORTABLE  CHEST 1 VIEW COMPARISON:  Chest radiograph performed 11/03/2015 FINDINGS: The lungs are well-aerated. Mild vascular congestion is noted. Mild bibasilar atelectasis is seen. There is no evidence of pleural effusion or pneumothorax. The cardiomediastinal silhouette is borderline normal in size. No acute osseous abnormalities are seen. IMPRESSION: Mild vascular congestion noted.  Mild bibasilar atelectasis seen. Electronically Signed   By: Garald Balding M.D.   On: 11/13/2015 23:31      Medications:   Impression:  Principal Problem:   Sepsis ([place]) Active Problems:   COPD (chronic obstructive pulmonary disease) (HCC)   Diabetes (HCC)   S/P AKA (above knee amputation) unilateral (HCC)   Leukocytosis   Pressure ulcer     Plan: Transfer to telemetry add Tamiflu 75 by mouth twice a day continue all current therapies  Consultants:     Procedures   Antibiotics: Vancomycin aztreonam patient allergic to penicillin                  Code Status:  Family Communication:    Disposition Plan see plan above  Time spent: 30 minutes   LOS: 0 days   [name] M   11/14/2015, 12:30 PM

## 2015-11-14 NOTE — Care Management Note (Signed)
Case Management Note  Patient Details  Name: [name] MRN: [id number] Date of Birth: March 06, 1951  Subjective/Objective:                  Pt admitted with sepsis. Pt is from [facility]. Anticipate return to SNF at [place] is aware and will arrange for return to facility.  Action/Plan: No CM needs anticipated.   Expected Discharge Date:       11/18/2015           Expected Discharge Plan:  Skilled Nursing Facility  In-House Referral:  Clinical Social Work  Discharge planning Services  CM Consult  Post Acute Care Choice:  NA Choice offered to:  NA  DME Arranged:    DME Agency:     HH Arranged:    [place] Agency:     Status of Service:  Completed, signed off  Medicare Important Message Given:    Date Medicare IM Given:    Medicare IM give by:    Date Additional Medicare IM Given:    Additional Medicare Important Message give by:     If discussed at [place] of Stay Meetings, dates discussed:    Additional Comments:  Sherald Barge, RN 11/14/2015, 3:43 PM

## 2015-11-14 NOTE — ED Notes (Signed)
BP 95/58  2.5mg  Lopressor given at this time

## 2015-11-14 NOTE — Progress Notes (Signed)
CRITICAL VALUE ALERT  Critical value received: positive Flu A  Date of notification:  11/14/2015  Time of notification:  0850  Critical value read back:Yes.    Nurse who received alert:  Eulis Canner schonewitz, rn   MD notified (1st page): [email or web address]  Time of first page:  [phone]  MD notified (2nd page):  Time of second page:  Responding MD: dondiego Time MD responded:  [phone]

## 2015-11-14 NOTE — H&P (Signed)
[facility] History and Physical  [name] [id number] DOB: [birth year]    PCP:   Maricela Curet, MD   Chief Complaint: SOB.  HPI: [name] is an 65 y.o. female with hx of HTN, diastolic CHF with EF [id number], prior CVA, recent right AKA, severe COPD, chronic lower back pain, GERD, HLD, PVD, recent admission to SNF after amputation, brought to the ER as she was having increase SOB.  Evaluation in the ER showed Temp of 102 and marked leukocytosis with WBC of 17K.  Her CXR showed bilateral atelectasis and pleural effusion with vascular congestions.  Her EKG showed STach with no acute ST T changes.  Her lactic acid was elevated to 2.7, and her UA showed only 0-5 WBCs with bacteria. Her BS was 200's and her Cr was 1.3. She was given IVF in the ER, and hospitalist was asked to admit her for sepsis, suspicious for HCAP.  She was given IV Lucianne Lei and IV Atreonam as she has allergy to PCN.  When I saw her, she appears lethargic, with HR 130's and RR 25, and her SBP was 120.    Rewiew of Systems:  Constitutional: Negative for malaise, fever and chills. No significant weight loss or weight gain Eyes: Negative for eye pain, redness and discharge, diplopia, visual changes, or flashes of light. ENMT: Negative for ear pain, hoarseness, nasal congestion, sinus pressure and sore throat. No headaches; tinnitus, drooling, or problem swallowing. Cardiovascular: Negative for chest pain, palpitations, diaphoresis,  and peripheral edema. ; No orthopnea, PND Respiratory: Negative for cough, hemoptysis, wheezing and stridor. No pleuritic chestpain. Gastrointestinal: Negative for nausea, vomiting, diarrhea, constipation, abdominal pain, melena, blood in stool, hematemesis, jaundice and rectal bleeding.    Genitourinary: Negative for frequency, dysuria, incontinence,flank pain and hematuria; Musculoskeletal: Negative for back pain and neck pain. Negative for swelling and trauma.;  Skin: . Negative for pruritus,  rash, abrasions, bruising and skin lesion.; ulcerations Neuro: Negative for headache, lightheadedness and neck stiffness. Negative for weakness, altered level of consciousness ,  extremity weakness, burning feet, involuntary movement, seizure and syncope.  Psych: negative for anxiety, depression, insomnia, tearfulness, panic attacks, hallucinations, paranoia, suicidal or homicidal ideation    Past Medical History  Diagnosis Date  . Hypertension   . COPD (chronic obstructive pulmonary disease) ([place])   . Stroke ([place])   . Chronic lower back pain   . Shingles Dec. 2016    Scalp  . Peripheral vascular disease ([place])   . GERD (gastroesophageal reflux disease)   . Hyperlipidemia   . Anxiety   . Type II diabetes mellitus ([place])   . Arthritis     "back" (10/30/2015)  . CHF (congestive heart failure) ([place])   . Hypoxia     Past Surgical History  Procedure Laterality Date  . Posterior lumbar fusion    . Foot surgery Bilateral     "from standing on concrete all day"  . Peripheral vascular catheterization N/A 04/10/2015    Procedure: Abdominal Aortogram w/Lower Extremity;  Surgeon: Conrad [facility], MD;  Location: [place];  Service: Cardiovascular;  Laterality: N/A;  . Thrombectomy femoral artery Right 04/10/2015    Procedure: THROMBECTOMY RIGHT TIBIAL  ARTERY WITH VEIN PATCH ANGIOPLASTY;  Surgeon: Elam Dutch, MD;  Location: [place];  Service: Vascular;  Laterality: Right;  . Fasciotomy Right 04/10/2015    Procedure: FOUR COMPARTMENT FASCIOTOMY;  Surgeon: Elam Dutch, MD;  Location: [facility] OR;  Service: Vascular;  Laterality: Right;  . Femoral-popliteal  bypass graft Right 04/10/2015    Procedure: RIGHT  FEMORAL-BELOW THE KNEE POPLITEAL ARTERY BYPASS GRAFT USING 6 MM X 80 CM PROPATEN GRAFT;  Surgeon: Elam Dutch, MD;  Location: [place];  Service: Vascular;  Laterality: Right;  . Intraoperative arteriogram Right 04/10/2015    Procedure: INTRA OPERATIVE ARTERIOGRAM;  Surgeon: Elam Dutch, MD;  Location: [place];  Service: Vascular;  Laterality: Right;  . Carotid endarterectomy Right 2010    Dr Kellie Simmering   . Peripheral vascular catheterization N/A 10/20/2015    Procedure: Abdominal Aortogram;  Surgeon: Elam Dutch, MD;  Location: [place] [facility];  Service: Cardiovascular;  Laterality: N/A;  . Laparoscopic cholecystectomy    . Back surgery    . Carotid endarterectomy Left 03/2008    Archie Endo 01/15/2011  . Shoulder open rotator cuff repair Right 02/2006    Archie Endo 01/29/2011  . Cardiac catheterization    . 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 Amputation Right 10/30/2015    Procedure: RIGHT ABOVE KNEE AMPUTATION;  Surgeon: Elam Dutch, MD;  Location: [facility] OR;  Service: Vascular;  Laterality: Right;    Medications:  HOME MEDS: Prior to Admission medications   Medication Sig Start Date End Date Taking? Authorizing Provider  albuterol (PROVENTIL HFA;VENTOLIN HFA) 108 (90 Base) MCG/ACT inhaler Inhale 2 puffs into the lungs every 6 (six) hours as needed for wheezing or shortness of breath.   Yes Historical Provider, MD  ALPRAZolam (XANAX) 0.25 MG tablet Take 0.25 mg by mouth 4 (four) times daily as needed for anxiety.    Yes Historical Provider, MD  aspirin 325 MG tablet Take 1 tablet (325 mg total) by mouth daily. 04/15/15  Yes Thurnell Lose, MD  atorvastatin (LIPITOR) 20 MG tablet Take 1 tablet (20 mg total) by mouth daily at 6 PM. 04/15/15  Yes Thurnell Lose, MD  citalopram (CELEXA) 20 MG tablet Take 20 mg by mouth daily. 07/04/13  Yes Historical Provider, MD  ferrous sulfate 325 (65 FE) MG tablet Take 1 tablet (325 mg total) by mouth daily with breakfast. 11/09/15  Yes Lucia Gaskins, MD  gabapentin (NEURONTIN) 300 MG capsule Take 300 mg by mouth 3 (three) times daily.   Yes Historical Provider, MD  insulin aspart (NOVOLOG) 100 UNIT/ML injection Inject 2-10 Units into the skin 3 (three) times daily before meals. 151-200= 2 units 201-250= 4 units 251-300= 6  units 301-350= 8 units 351-400=10units If greater than 400-Call NP/Dr   Yes Historical Provider, MD  Insulin Glargine (LANTUS SOLOSTAR) 100 UNIT/ML Solostar Pen Inject 34 Units into the skin at bedtime.   Yes Historical Provider, MD  ipratropium-albuterol (DUONEB) 0.5-2.5 (3) MG/3ML SOLN Take 3 mLs by nebulization every 8 (eight) hours.    Yes Historical Provider, MD  lisinopril (PRINIVIL,ZESTRIL) 20 MG tablet Take 1 tablet (20 mg total) by mouth daily. 11/04/15  Yes Alvia Grove, PA-C  metFORMIN (GLUCOPHAGE) 500 MG tablet Take 500 mg by mouth 2 (two) times daily with a meal.   Yes Historical Provider, MD  metoprolol tartrate (LOPRESSOR) 25 MG tablet Take 25 mg by mouth 2 (two) times daily.    Yes Historical Provider, MD  naproxen (NAPROSYN) 500 MG tablet Take 500 mg by mouth 2 (two) times daily as needed for mild pain or moderate pain.  03/16/15  Yes Historical Provider, MD  oxyCODONE 10 MG TABS Take 1 tablet (10 mg total) by mouth every 4 (four) hours as needed for moderate pain. 11/09/15  Yes  Lucia Gaskins, MD  pantoprazole (PROTONIX) 40 MG tablet Take 40 mg by mouth daily.   Yes Historical Provider, MD  predniSONE (DELTASONE) 20 MG tablet Take 1 tablet (20 mg total) by mouth 2 (two) times daily with a meal. 11/09/15  Yes Lucia Gaskins, MD  varenicline (CHANTIX) 0.5 MG tablet Take 2 tablets (1 mg total) by mouth 2 (two) times daily. 08/23/15  Yes Lucia Gaskins, MD     Allergies:  Allergies  Allergen Reactions  . Codeine Shortness Of Breath  . Penicillins Other (See Comments)    Has patient had a PCN reaction causing immediate rash, facial/tongue/throat swelling, SOB or lightheadedness with hypotension: unknown Has patient had a PCN reaction causing severe rash involving mucus membranes or skin necrosis: unknown Has patient had a PCN reaction that required hospitalization unknown Has patient had a PCN reaction occurring within the last 10 years: unknown If all of the above answers  are "NO", then may proceed with Cephalosporin use.  . Ciprofloxacin Hives    Social History:   reports that she has quit smoking. Her smoking use included Cigarettes. She has a 5.88 pack-year smoking history. She has never used smokeless tobacco. She reports that she does not drink alcohol or use illicit drugs.  Family History: Family History  Problem Relation Age of Onset  . Cancer Father   . Heart attack Sister   . Stroke Sister   . Hypertension Sister   . Hypertension Brother      Physical Exam: Filed Vitals:   11/14/15 0105 11/14/15 0134 11/14/15 0200 11/14/15 0208  BP:  99/54 101/56   Pulse:  133 132   Temp: 101.6 F (38.7 C)     TempSrc: Rectal     Resp:  24 17   SpO2:  94% 95% 97%   Blood pressure 101/56, pulse 132, temperature 101.6 F (38.7 C), temperature source Rectal, resp. rate 17, SpO2 97 %.  GEN:  Pleasant  patient lying in the stretcher in no acute distress; cooperative with exam. PSYCH:  alert and oriented x4; does not appear anxious or depressed; affect is appropriate. HEENT: Mucous membranes pink and anicteric; PERRLA; EOM intact; no cervical lymphadenopathy nor thyromegaly or carotid bruit; no JVD; There were no stridor. Neck is very supple. Breasts:: Not examined CHEST WALL: No tenderness CHEST: Normal respiration, bilateral wheezing.  HEART: Regular rate and rhythm.  There are no murmur, rub, or gallops.   BACK: No kyphosis or scoliosis; no CVA tenderness ABDOMEN: soft and non-tender; no masses, no organomegaly, normal abdominal bowel sounds; no pannus; no intertriginous candida. There is no rebound and no distention. Rectal Exam: Not done EXTREMITIES:no swelling.  S/P Right AKA with no evidence of cellulitis.  Genitalia: not examined PULSES: 2+ and symmetric SKIN: Normal hydration no rash or ulceration.  She has 2 small non infected decubitus.  CNS: Cranial nerves 2-12 grossly intact no focal lateralizing neurologic deficit.  Speech is fluent;  uvula elevated with phonation, facial symmetry and tongue midline. DTR are normal bilaterally, cerebella exam is intact, barbinski is negative and strengths are equaled bilaterally.  No sensory loss.   Labs on Admission:  Basic Metabolic Panel:  Recent Labs Lab 11/07/15 0627 11/13/15 2300  NA 137 134*  K 3.4* 4.3  CL 90* 91*  CO2 37* 33*  GLUCOSE 374* 289*  BUN 34* 23*  CREATININE 0.95 0.87  CALCIUM 8.7* 8.6*   Liver Function Tests:  Recent Labs Lab 11/07/15 0627 11/13/15 2300  AST 16 15  ALT 15 16  ALKPHOS 64 66  BILITOT 0.7 0.8  PROT 6.3* 7.0  ALBUMIN 3.2* 3.7   CBC:  Recent Labs Lab 11/13/15 2300  WBC 17.2*  NEUTROABS 14.1*  HGB 12.6  HCT 41.4  MCV 86.6  PLT 333    CBG:  Recent Labs Lab 11/08/15 1621 11/08/15 2140 11/09/15 0736 11/09/15 1104 11/09/15 1653  GLUCAP 327* 310* 349* 341* 345*     Radiological Exams on Admission: Dg Chest Portable 1 View  11/13/2015  CLINICAL DATA:  Acute onset of fever and decreased O2 saturation. Initial encounter. EXAM: PORTABLE CHEST 1 VIEW COMPARISON:  Chest radiograph performed 11/03/2015 FINDINGS: The lungs are well-aerated. Mild vascular congestion is noted. Mild bibasilar atelectasis is seen. There is no evidence of pleural effusion or pneumothorax. The cardiomediastinal silhouette is borderline normal in size. No acute osseous abnormalities are seen. IMPRESSION: Mild vascular congestion noted.  Mild bibasilar atelectasis seen. Electronically Signed   By: Garald Balding M.D.   On: 11/13/2015 23:31    EKG: Independently reviewed.   Assessment/Plan Present on Admission:  . Sepsis ([place]) . COPD (chronic obstructive pulmonary disease) ([place]) . Leukocytosis   PLAN:  Sepsis:  She will receive IV Van/Atreonam.  Suspect she may have PNA, though I am not certain.   CXR failed to show any definite infiltrate.  Her marked leukocytosis may be partly due to prednisone.  Fortunately she doesn't require pressor.  She has  some volume overload, so IVF is to be avoided.   COPD Exacerbation:  Will continue with IV Steroid, and use Zopenex for nebs given marked tachycardia.    HTN:  Her BP is normal at this time.  Will hold her BP meds, except for the betablocker.  Will continue with it.   Anxiety:  Tachycardia may be aggravated due to betablocker withdrawal and benzo withdrawal, so will continue both.  DM:  Will use resistant SSI as she is on steroids.    I have told her family that she is critically ill, and we will admit her to the ICU.  She is a patient of Dr Cindie Laroche, and will be admitted to his service as per prior arrangement.  Thank you and Good Day.     Other plans as per orders. Code Status: FULL CODE. (confirmed tonight).   Orvan Falconer, MD. FACP [facility] Pager [phone] 7pm to 7am.  11/14/2015, 2:19 AM

## 2015-11-14 NOTE — Progress Notes (Signed)
Inpatient Diabetes Program Recommendations  AACE/ADA: New Consensus Statement on Inpatient Glycemic Control (2015)  Target Ranges:  Prepandial:   less than 140 mg/dL      [place]ak postprandial:   less than 180 mg/dL (1-2 hours)      Critically ill patients:  140 - 180 mg/dL  Results for [name], [name] (MRN [id number]) as of 11/14/2015 08:16  Ref. Range 11/14/2015 03:54 11/14/2015 07:19  Glucose-Capillary Latest Ref Range: 65-99 mg/dL 229 (H) 226 (H)   Review of Glycemic Control  Diabetes history: DM2 Outpatient Diabetes medications: Lantus 34 units QHS, Novolog 2-10 units TID with meals, Metformin 500 mg BID Current orders for Inpatient glycemic control: Novolog 0-20 units Q4H  Inpatient Diabetes Program Recommendations: Insulin - Basal: Please consider ordering at least half of outpatient Lantus dose (patient takes Lantus 34 units QHS as an outpatient).  Thanks, Barnie Alderman, RN, MSN, CDE Diabetes Coordinator Inpatient Diabetes Program [phone] (Team Pager from [place] to [place]) [phone] ([facility] office) [phone] [facility] office) [phone] [facility] office)

## 2015-11-15 LAB — CBC WITH DIFFERENTIAL/PLATELET
BASOS ABS: 0 10*3/uL (ref 0.0–0.1)
BASOS PCT: 0 %
Eosinophils Absolute: 0 10*3/uL (ref 0.0–0.7)
Eosinophils Relative: 0 %
HEMATOCRIT: 31.9 % — AB (ref 36.0–46.0)
HEMOGLOBIN: 9.9 g/dL — AB (ref 12.0–15.0)
Lymphocytes Relative: 5 %
Lymphs Abs: 0.6 10*3/uL — ABNORMAL LOW (ref 0.7–4.0)
MCH: 27 pg (ref 26.0–34.0)
MCHC: 31 g/dL (ref 30.0–36.0)
MCV: 86.9 fL (ref 78.0–100.0)
Monocytes Absolute: 0.2 10*3/uL (ref 0.1–1.0)
Monocytes Relative: 2 %
NEUTROS ABS: 11 10*3/uL — AB (ref 1.7–7.7)
NEUTROS PCT: 93 %
Platelets: 204 10*3/uL (ref 150–400)
RBC: 3.67 MIL/uL — ABNORMAL LOW (ref 3.87–5.11)
RDW: 19.3 % — AB (ref 11.5–15.5)
WBC: 11.7 10*3/uL — ABNORMAL HIGH (ref 4.0–10.5)

## 2015-11-15 LAB — BASIC METABOLIC PANEL
ANION GAP: 8 (ref 5–15)
BUN: 24 mg/dL — ABNORMAL HIGH (ref 6–20)
CALCIUM: 8.5 mg/dL — AB (ref 8.9–10.3)
CHLORIDE: 97 mmol/L — AB (ref 101–111)
CO2: 28 mmol/L (ref 22–32)
Creatinine, Ser: 0.76 mg/dL (ref 0.44–1.00)
GFR calc non Af Amer: >60 mL/min (ref >60)
Glucose, Bld: 336 mg/dL — ABNORMAL HIGH (ref 65–99)
Potassium: 4.6 mmol/L (ref 3.5–5.1)
Sodium: 133 mmol/L — ABNORMAL LOW (ref 135–145)

## 2015-11-15 LAB — GLUCOSE, CAPILLARY
GLUCOSE-CAPILLARY: 249 mg/dL — AB (ref 65–99)
GLUCOSE-CAPILLARY: 327 mg/dL — AB (ref 65–99)
GLUCOSE-CAPILLARY: 297 mg/dL — AB (ref 65–99)
Glucose-Capillary: 428 mg/dL — ABNORMAL HIGH (ref 65–99)

## 2015-11-15 LAB — LACTIC ACID, PLASMA: LACTIC ACID, VENOUS: 2.9 mmol/L — AB (ref 0.5–2.0)

## 2015-11-15 LAB — RESPIRATORY VIRUS PANEL
ADENOVIRUS: NEGATIVE
Influenza A: POSITIVE — AB
Influenza B: NEGATIVE
METAPNEUMOVIRUS: NEGATIVE
Parainfluenza 1: NEGATIVE
Parainfluenza 2: NEGATIVE
Parainfluenza 3: NEGATIVE
RESPIRATORY SYNCYTIAL VIRUS A: NEGATIVE
RHINOVIRUS: NEGATIVE
Respiratory Syncytial Virus B: NEGATIVE

## 2015-11-15 LAB — URINE CULTURE: Culture: NO GROWTH

## 2015-11-15 MED ORDER — SODIUM CHLORIDE 0.9 % IV SOLN
INTRAVENOUS | Status: DC
  Administered 2015-11-15 – 2015-11-16 (×2): via INTRAVENOUS

## 2015-11-15 MED ORDER — LEVALBUTEROL HCL 0.63 MG/3ML IN NEBU
0.6300 mg | INHALATION_SOLUTION | Freq: Three times a day (TID) | RESPIRATORY_TRACT | Status: DC
  Administered 2015-11-15 – 2015-11-17 (×5): 0.63 mg via RESPIRATORY_TRACT
  Filled 2015-11-15 (×5): qty 3

## 2015-11-15 MED ORDER — INSULIN GLARGINE 100 UNIT/ML ~~LOC~~ SOLN
34.0000 [IU] | Freq: Every day | SUBCUTANEOUS | Status: DC
  Administered 2015-11-15 – 2015-11-16 (×2): 34 [IU] via SUBCUTANEOUS
  Filled 2015-11-15 (×5): qty 0.34

## 2015-11-15 NOTE — Progress Notes (Signed)
Currently on vancomycin and aztreonam allergic to penicillin. Patient tested positive for reference wines a with COPD exacerbation on steroids at present with nebulizers [name] [id number] DOB: Aug 08, 1951 DOA: 11/13/2015 PCP: Maricela Curet, MD             Physical Exam: Blood pressure 125/68, pulse 73, temperature 97 F (36.1 C), temperature source Oral, resp. rate 14, height 5\' 6"  (1.676 m), weight 174 lb 2.6 oz (79 kg), SpO2 89 %. lungs show mild expiratory to inspiratory wheeze scattered rhonchi no rales appreciable heart regular rhythm no S3-S4 no heaves thrills or rubs   Investigations:  Recent Results (from the past 240 hour(s))  Culture, blood (routine x 2)     Status: None (Preliminary result)   Collection Time: 11/13/15 11:10 PM  Result Value Ref Range Status   Specimen Description BLOOD RIGHT ANTECUBITAL DRAWN BY RN JM  Final   Special Requests BOTTLES DRAWN AEROBIC AND ANAEROBIC 8CC EACH  Final   Culture NO GROWTH 2 DAYS  Final   Report Status PENDING  Incomplete  Culture, blood (routine x 2)     Status: None (Preliminary result)   Collection Time: 11/13/15 11:10 PM  Result Value Ref Range Status   Specimen Description BLOOD LEFT FOREARM DRAWN BY RN BP  Final   Special Requests BOTTLES DRAWN AEROBIC AND ANAEROBIC 6CC EACH  Final   Culture NO GROWTH 2 DAYS  Final   Report Status PENDING  Incomplete     Basic Metabolic Panel:  Recent Labs  11/13/15 2300  NA 134*  K 4.3  CL 91*  CO2 33*  GLUCOSE 289*  BUN 23*  CREATININE 0.87  CALCIUM 8.6*   Liver Function Tests:  Recent Labs  11/13/15 2300  AST 15  ALT 16  ALKPHOS 66  BILITOT 0.8  PROT 7.0  ALBUMIN 3.7     CBC:  Recent Labs  11/13/15 2300  WBC 17.2*  NEUTROABS 14.1*  HGB 12.6  HCT 41.4  MCV 86.6  PLT 333    Dg Chest Portable 1 View  11/13/2015  CLINICAL DATA:  Acute onset of fever and decreased O2 saturation. Initial encounter. EXAM: PORTABLE CHEST 1 VIEW COMPARISON:   Chest radiograph performed 11/03/2015 FINDINGS: The lungs are well-aerated. Mild vascular congestion is noted. Mild bibasilar atelectasis is seen. There is no evidence of pleural effusion or pneumothorax. The cardiomediastinal silhouette is borderline normal in size. No acute osseous abnormalities are seen. IMPRESSION: Mild vascular congestion noted.  Mild bibasilar atelectasis seen. Electronically Signed   By: Garald Balding M.D.   On: 11/13/2015 23:31      Medications:   Impression:  Principal Problem:   Sepsis ([place]) Active Problems:   COPD (chronic obstructive pulmonary disease) ([facility])   Diabetes ([facility])   S/P AKA (above knee amputation) unilateral ([facility])   Leukocytosis   Pressure ulcer     Plan: Continue Vanco and aztreonam empirically continue DuoNeb continue Solu-Medrol IV 60 mg continue Tamiflu 75 by mouth twice a day  Consultants:    Procedures   Antibiotics: Vancomycin aztreonam                  Code Status: Full  Family Communication:  Discussed with patient  Disposition Plan   Time spent: 30 minutes   LOS: 1 day   Jennye Runquist M   11/15/2015, 11:05 AM

## 2015-11-15 NOTE — Clinical Documentation Improvement (Signed)
Internal Medicine  Can the diagnosis of Hypotension be further specified? Please document findings in next progress note NOT in BPA drop down box. Thank you!   Shock, including Type:  Septic, Cardiogenic, Hyper/Hypoglycemic, Hypovolemic, Hemorrhagic, Neurogenic, Anaphylactic, Other type, including suspected or known cause and/or associated condition(s)  Other  Clinically Undetermined  Document any associated diagnoses/conditions.  Supporting Information:  Being treated for sepsis, Influenza A and possible pneumonia  BP's running 92/46 Map of 60; 83/51 Map of 63; Heart Rate > 100  Lactate 2.66  Fluid resuscitated and IV Azactam initiated q8h; IV Vancomycin q12h  Please exercise your independent, professional judgment when responding. A specific answer is not anticipated or expected.  Thank You,  Zoila Shutter RN, BSN, [place] [phone]; Cell: [phone]

## 2015-11-15 NOTE — Progress Notes (Signed)
CRITICAL VALUE ALERT  Critical value received: Lactic Acid 2.9  Date of notification:  11/15/15  Time of notification: 1258  Critical value read back:Yes  Nurse who received alert: Joelyn Oms   MD notified (1st page): Dr. Cindie Laroche  Time of first page: 1338  MD notified (2nd page):  Time of second page:  Responding MD:Dr. Cindie Laroche  Time MD responded: [phone]

## 2015-11-15 NOTE — Progress Notes (Signed)
CBG was 428. MD notified/aware. Verbal orders for 23 units of Novolog ordered to give and 34 units ofLantus at bedtime ordered. RN will give medication and continue to monitor. Oswald Hillock, RN

## 2015-11-15 NOTE — Care Management Note (Signed)
Case Management Note  Patient Details  Name: VIVA CHAMBLISS MRN: [id number] Date of Birth: [birth year]   Expected Discharge Date:     11/16/2015             Expected Discharge Plan:  [place]  In-House Referral:  Clinical Social Work  Discharge planning Services  CM Consult  Post Acute Care Choice:  Home Health, Durable Medical Equipment Choice offered to:  Patient  DME Arranged:  Programmer, multimedia DME Agency:  [place]:  RN, PT, Nurse's Aide [place] Agency:  [place]  Status of Service:  In process, will continue to follow  Medicare Important Message Given:    Date Medicare IM Given:    Medicare IM give by:    Date Additional Medicare IM Given:    Additional Medicare Important Message give by:     If discussed at [place] of Stay Meetings, dates discussed:    Additional Comments: Pt has now decided she does not want to return to SNF and wishes to go home and receive PT there. Pt understands she will not get daily therapy as she will at SNF and she is okay with this. Pt has BSC and walker at home pt states she needs wheelchair. Pt states she will be able to transfer herself in and out of wheelchair. Pt has used [facility] in the past and would like them again. Blake Divine of [facility], made aware of [place] and DME referral and will obtain pt info from chart and deliver WC to room. Will cont to follow for further DC planning needs.   Sherald Barge, RN 11/15/2015, 1:53 PM

## 2015-11-15 NOTE — Progress Notes (Signed)
Called report to Lisa Covington, RN on dept 300. Verbalized understanding. Pt transferred to room 310 in safe and stable condition.  

## 2015-11-16 LAB — BASIC METABOLIC PANEL
Anion gap: 7 (ref 5–15)
BUN: 25 mg/dL — AB (ref 6–20)
CHLORIDE: 98 mmol/L — AB (ref 101–111)
CO2: 31 mmol/L (ref 22–32)
Calcium: 8.6 mg/dL — ABNORMAL LOW (ref 8.9–10.3)
Creatinine, Ser: 0.75 mg/dL (ref 0.44–1.00)
GFR calc Af Amer: >60 mL/min (ref >60)
GFR calc non Af Amer: >60 mL/min (ref >60)
GLUCOSE: 260 mg/dL — AB (ref 65–99)
Potassium: 4.5 mmol/L (ref 3.5–5.1)
Sodium: 136 mmol/L (ref 135–145)

## 2015-11-16 LAB — GLUCOSE, CAPILLARY
GLUCOSE-CAPILLARY: 239 mg/dL — AB (ref 65–99)
Glucose-Capillary: 376 mg/dL — ABNORMAL HIGH (ref 65–99)
Glucose-Capillary: 366 mg/dL — ABNORMAL HIGH (ref 65–99)
Glucose-Capillary: 341 mg/dL — ABNORMAL HIGH (ref 65–99)

## 2015-11-16 NOTE — Progress Notes (Signed)
Patient is less bronchospasm today significantly improved we'll plan discharge in 24 hours [name] [id number] DOB: Nov 16, 1950 DOA: 11/13/2015 PCP: Maricela Curet, MD             Physical Exam: Blood pressure 114/62, pulse 62, temperature 98 F (36.7 C), temperature source Oral, resp. rate 20, height 5\' 6"  (1.676 m), weight 174 lb 2.6 oz (79 kg), SpO2 91 %. lungs show mild inspiratory wheezes scattered rhonchi no rales appreciable heart regular rhythm no S3 or S4 no heaves thrills rubs abdomen soft nontender bowel sounds normoactive   Investigations:  Recent Results (from the past 240 hour(s))  Culture, blood (routine x 2)     Status: None (Preliminary result)   Collection Time: 11/13/15 11:10 PM  Result Value Ref Range Status   Specimen Description BLOOD RIGHT ANTECUBITAL DRAWN BY RN JM  Final   Special Requests BOTTLES DRAWN AEROBIC AND ANAEROBIC [place]  Final   Culture NO GROWTH 3 DAYS  Final   Report Status PENDING  Incomplete  Culture, blood (routine x 2)     Status: None (Preliminary result)   Collection Time: 11/13/15 11:10 PM  Result Value Ref Range Status   Specimen Description BLOOD LEFT FOREARM DRAWN BY RN BP  Final   Special Requests BOTTLES DRAWN AEROBIC AND ANAEROBIC [place]  Final   Culture NO GROWTH 3 DAYS  Final   Report Status PENDING  Incomplete  Urine culture     Status: None   Collection Time: 11/13/15 11:20 PM  Result Value Ref Range Status   Specimen Description URINE, CATHETERIZED  Final   Special Requests NONE  Final   Culture   Final    NO GROWTH 1 DAY Performed at [facility]    Report Status 11/15/2015 FINAL  Final  Respiratory virus panel     Status: Abnormal   Collection Time: 11/14/15  6:40 AM  Result Value Ref Range Status   Respiratory Syncytial Virus A Negative Negative Final   Respiratory Syncytial Virus B Negative Negative Final   Influenza A Positive (A) Negative Final    Comment: Subtype: H3   Influenza B  Negative Negative Final   Parainfluenza 1 Negative Negative Final   Parainfluenza 2 Negative Negative Final   Parainfluenza 3 Negative Negative Final   Metapneumovirus Negative Negative Final   Rhinovirus Negative Negative Final   Adenovirus Negative Negative Final    Comment: (NOTE) Performed At: [facility] [address] [place], [place] [id number] Lindon Romp MD [id number]      Basic Metabolic Panel:  Recent Labs  11/15/15 1144 11/16/15 0614  NA 133* 136  K 4.6 4.5  CL 97* 98*  CO2 28 31  GLUCOSE 336* 260*  BUN 24* 25*  CREATININE 0.76 0.75  CALCIUM 8.5* 8.6*   Liver Function Tests:  Recent Labs  11/13/15 2300  AST 15  ALT 16  ALKPHOS 66  BILITOT 0.8  PROT 7.0  ALBUMIN 3.7     CBC:  Recent Labs  11/13/15 2300 11/15/15 1144  WBC 17.2* 11.7*  NEUTROABS 14.1* 11.0*  HGB 12.6 9.9*  HCT 41.4 31.9*  MCV 86.6 86.9  PLT 333 204    No results found.    Medications:   Impression:  Principal Problem:   Sepsis ([place]) Active Problems:   COPD (chronic obstructive pulmonary disease) (H[facility])   Diabetes ([facility])   S/P AKA (above knee amputation) unilateral (HCC)   Leukocytosis   Pressure ulcer  Plan: Continue IV steroids and nebulizer therapy and plan for discharge in a.m.  Consultants:    Procedures   Antibiotics:                   Code Status:  Family Communication:    Disposition Plan   Time spent: 30 minutes   LOS: 2 days   Shalin Linders M   11/16/2015, 1:45 PM

## 2015-11-16 NOTE — Progress Notes (Signed)
Inpatient Diabetes Program Recommendations  AACE/ADA: New Consensus Statement on Inpatient Glycemic Control (2015)  Target Ranges:  Prepandial:   less than 140 mg/dL      Peak postprandial:   less than 180 mg/dL (1-2 hours)      Critically ill patients:  140 - 180 mg/dL  Results for [name], [name] (MRN [id number]) as of 11/16/2015 11:48  Ref. Range 11/15/2015 07:21 11/15/2015 11:09 11/15/2015 16:39 11/15/2015 21:19 11/16/2015 07:43 11/16/2015 11:06  Glucose-Capillary Latest Ref Range: 65-99 mg/dL 249 (H) 327 (H) 428 (H) 297 (H) 239 (H) 376 (H)   Review of Glycemic Control  Current orders for Inpatient glycemic control: Lantus 34 units QHS, Novolog 0-20 units ACHS  Inpatient Diabetes Program Recommendations: Correction (SSI): Noted glucose of 376 at 11:06 am today. In reviewing the chart, noted glucose 239 mg/dl at 7:43 am but Novolog 7 units for correction of 239 mg/dl glucose was not given until 10:00. As a result, glucose up to 376 mg/dl at 11:06 due to receiving Novolog correction so late. NURSING: Please administer Novolog correction within 1 hour of obtaining CBG.  Thanks, Barnie Alderman, RN, MSN, CDE Diabetes Coordinator Inpatient Diabetes Program [phone] (Team Pager from [place] to [place]) [phone] ([place] office) [phone] [facility] office) [phone] [facility] office)

## 2015-11-17 LAB — GLUCOSE, CAPILLARY
GLUCOSE-CAPILLARY: 285 mg/dL — AB (ref 65–99)
Glucose-Capillary: 398 mg/dL — ABNORMAL HIGH (ref 65–99)

## 2015-11-17 MED ORDER — LEVALBUTEROL HCL 0.63 MG/3ML IN NEBU: 0.6300 mg | INHALATION_SOLUTION | Freq: Two times a day (BID) | RESPIRATORY_TRACT | Status: DC

## 2015-11-17 MED ORDER — PREDNISONE 20 MG PO TABS: 20.0000 mg | ORAL_TABLET | Freq: Every day | ORAL | Status: DC

## 2015-11-17 NOTE — Discharge Summary (Signed)
Physician Discharge Summary  [name] [id number] DOB: 16-Jan-1951 DOA: 11/13/2015  PCP: Maricela Curet, MD  Admit date: 11/13/2015 Discharge date: 11/17/2015   Recommendations for Outpatient Follow-up:  Patient is advised to return to skilled nursing facility to undergo extensive physical rehabilitation for recent AKA right limb was to continue her pulmonary therapy of nebulizer therapy and oral steroids tapering over a two-week period of acute exacerbation prompted by influenza A Discharge Diagnoses:  Principal Problem:   Sepsis ([facility]) Active Problems:   COPD (chronic obstructive pulmonary disease) ([place])   Diabetes ([place])   S/P AKA (above knee amputation) unilateral ([facility])   Leukocytosis   Pressure ulcer   Discharge Condition: Good and improving  Filed Weights   11/14/15 0345 11/15/15 0500  Weight: 171 lb 4.8 oz (77.7 kg) 174 lb 2.6 oz (79 kg)    History of present illness:  The patient had a recent history of a right AKA amputation was placed in skilled nursing facility and continue with the COPD exacerbation chest x-ray revealed no evidence of infiltrate she is placed on vancomycin and aztreonam empirically for the thoughts of healthcare associated pneumonia she tested positive for influenza A was less cefazolin placed on Tamiflu 75 by mouth twice a day for a period of 5 days she defervescing her bronchospasm regressed over a 5 day period and she was tapered off of IV Solu-Medrol onto oral prednisone now 40 mg daily for 7 days and tapering to 20 mg daily for an additional 7 days then discontinue she is to continue DuoNeb nebulizer as well as her Chantix for smoking cessation  Hospital Course:  See history of present illness  Procedures:    Consultations:    Discharge Instructions  Discharge Instructions    Discharge instructions    Complete by:  As directed      Discharge patient    Complete by:  As directed             Medication List    TAKE these  medications        albuterol 108 (90 Base) MCG/ACT inhaler  Commonly known as:  PROVENTIL HFA;VENTOLIN HFA  Inhale 2 puffs into the lungs every 6 (six) hours as needed for wheezing or shortness of breath.     ALPRAZolam 0.25 MG tablet  Commonly known as:  XANAX  Take 0.25 mg by mouth 4 (four) times daily as needed for anxiety.     aspirin 325 MG tablet  Take 1 tablet (325 mg total) by mouth daily.     atorvastatin 20 MG tablet  Commonly known as:  LIPITOR  Take 1 tablet (20 mg total) by mouth daily at 6 PM.     citalopram 20 MG tablet  Commonly known as:  CELEXA  Take 20 mg by mouth daily.     ferrous sulfate 325 (65 FE) MG tablet  Take 1 tablet (325 mg total) by mouth daily with breakfast.     gabapentin 300 MG capsule  Commonly known as:  NEURONTIN  Take 300 mg by mouth 3 (three) times daily.     insulin aspart 100 UNIT/ML injection  Commonly known as:  novoLOG  Inject 2-10 Units into the skin 3 (three) times daily before meals. 151-200= 2 units 201-250= 4 units 251-300= 6 units 301-350= 8 units 351-400=10units If greater than 400-Call NP/Dr     ipratropium-albuterol 0.5-2.5 (3) MG/3ML Soln  Commonly known as:  DUONEB  Take 3 mLs by nebulization every 8 (eight)  hours.     LANTUS SOLOSTAR 100 UNIT/ML Solostar Pen  Generic drug:  Insulin Glargine  Inject 34 Units into the skin at bedtime.     lisinopril 20 MG tablet  Commonly known as:  PRINIVIL,ZESTRIL  Take 1 tablet (20 mg total) by mouth daily.     metFORMIN 500 MG tablet  Commonly known as:  GLUCOPHAGE  Take 500 mg by mouth 2 (two) times daily with a meal.     metoprolol tartrate 25 MG tablet  Commonly known as:  LOPRESSOR  Take 25 mg by mouth 2 (two) times daily.     naproxen 500 MG tablet  Commonly known as:  NAPROSYN  Take 500 mg by mouth 2 (two) times daily as needed for mild pain or moderate pain.     Oxycodone HCl 10 MG Tabs  Take 1 tablet (10 mg total) by mouth every 4 (four) hours as needed for  moderate pain.     pantoprazole 40 MG tablet  Commonly known as:  PROTONIX  Take 40 mg by mouth daily.     predniSONE 20 MG tablet  Commonly known as:  DELTASONE  Take 1 tablet (20 mg total) by mouth 2 (two) times daily with a meal.     varenicline 0.5 MG tablet  Commonly known as:  CHANTIX  Take 2 tablets (1 mg total) by mouth 2 (two) times daily.       Allergies  Allergen Reactions  . Codeine Shortness Of Breath  . Penicillins Other (See Comments)    Has patient had a PCN reaction causing immediate rash, facial/tongue/throat swelling, SOB or lightheadedness with hypotension: unknown Has patient had a PCN reaction causing severe rash involving mucus membranes or skin necrosis: unknown Has patient had a PCN reaction that required hospitalization unknown Has patient had a PCN reaction occurring within the last 10 years: unknown If all of the above answers are "NO", then may proceed with Cephalosporin use.  . Ciprofloxacin Hives      The results of significant diagnostics from this hospitalization (including imaging, microbiology, ancillary and laboratory) are listed below for reference.    Significant Diagnostic Studies: Dg Chest 2 View  11/03/2015  CLINICAL DATA:  SOB, sick, faint episode earlier today, pt was transferred to the [facility] today., pt had amputation of leg on Monday 10/30/15 EXAM: CHEST  2 VIEW COMPARISON:  A 08/17/2015 FINDINGS: Cardiac silhouette is mildly enlarged. No mediastinal or hilar masses or evidence of adenopathy. The thickening of the interstitial markings most the lung bases, stable prior study. Small bilateral pleural effusions are also stable. No new lung opacities. No pneumothorax the bony thorax is demineralized grossly intact. IMPRESSION: 1. Stable appearance the prior study. 2. Mild cardiomegaly, interstitial thickening and small effusions. Mild chronic congestive heart failure suspected. Electronically Signed   By: Lajean Manes M.D.   On: 11/03/2015  18:40   Ct Angio Chest Pe W/cm &/or Wo Cm  11/03/2015  CLINICAL DATA:  Sharp, intermittent low back pain. Wheezing. Shortness of breath. EXAM: CT ANGIOGRAPHY CHEST WITH CONTRAST TECHNIQUE: Multidetector CT imaging of the chest was performed using the standard protocol during bolus administration of intravenous contrast. Multiplanar CT image reconstructions and MIPs were obtained to evaluate the vascular anatomy. CONTRAST:  117mL OMNIPAQUE IOHEXOL 350 MG/ML SOLN COMPARISON:  Chest radiographs obtained earlier today. FINDINGS: Mediastinum/Lymph Nodes: Mildly enlarged AP window lymph node with a short axis diameter of 12 mm on image number 34 of series 4. Enlarged subcarinal node with  a short axis diameter of 15 mm on image number 54. Enlarged left hilar node with a short axis diameter of 12 mm on image number 53. No axillary or upper abdominal adenopathy. Small paraesophageal varices. Normally opacified pulmonary arteries with no pulmonary arterial filling defects. No aortic aneurysm or dissection. Lungs/Pleura: Small to moderate-sized bilateral pleural effusions. Bilateral lower lobe compressive atelectasis. 11 mm sub solid nodule in the inferior right upper lobe on image number 43. This appears more linear in the coronal and sagittal planes. 7 mm sub solid nodule in the inferior, posterior aspect of the left upper lobe on image number 49. Diffuse peribronchial thickening and prominence of the interstitial markings. Upper abdomen: Prominent lateral segment of the left lobe of the liver and prominent caudate lobe of the liver. The liver contours are mildly lobulated. Musculoskeletal: Thoracic spine degenerative changes and changes of DISH. Review of the MIP images confirms the above findings. IMPRESSION: 1. No pulmonary emboli. 2. Small to moderate-sized bilateral pleural effusions with bilateral lower lobe atelectasis. 3. Probable combination of chronic interstitial lung disease and mild interstitial pulmonary  edema. 4. Single subsolid nodule in each upper lobe, as described above. These could represent small foci of the infection or scarring. Neoplastic processes are less likely. Recommend a followup chest CT with contrast in 6-12 months. 5. Possible changes of cirrhosis of the liver with small esophageal varices. 6. Probable reactive mediastinal and left hilar adenopathy. Electronically Signed   By: Claudie Revering M.D.   On: 11/03/2015 22:21   Dg Chest Portable 1 View  11/13/2015  CLINICAL DATA:  Acute onset of fever and decreased O2 saturation. Initial encounter. EXAM: PORTABLE CHEST 1 VIEW COMPARISON:  Chest radiograph performed 11/03/2015 FINDINGS: The lungs are well-aerated. Mild vascular congestion is noted. Mild bibasilar atelectasis is seen. There is no evidence of pleural effusion or pneumothorax. The cardiomediastinal silhouette is borderline normal in size. No acute osseous abnormalities are seen. IMPRESSION: Mild vascular congestion noted.  Mild bibasilar atelectasis seen. Electronically Signed   By: Garald Balding M.D.   On: 11/13/2015 23:31    Microbiology: Recent Results (from the past 240 hour(s))  Culture, blood (routine x 2)     Status: None (Preliminary result)   Collection Time: 11/13/15 11:10 PM  Result Value Ref Range Status   Specimen Description BLOOD RIGHT ANTECUBITAL DRAWN BY RN JM  Final   Special Requests BOTTLES DRAWN AEROBIC AND ANAEROBIC [place]  Final   Culture NO GROWTH 4 DAYS  Final   Report Status PENDING  Incomplete  Culture, blood (routine x 2)     Status: None (Preliminary result)   Collection Time: 11/13/15 11:10 PM  Result Value Ref Range Status   Specimen Description BLOOD LEFT FOREARM DRAWN BY RN BP  Final   Special Requests BOTTLES DRAWN AEROBIC AND ANAEROBIC [place]  Final   Culture NO GROWTH 4 DAYS  Final   Report Status PENDING  Incomplete  Urine culture     Status: None   Collection Time: 11/13/15 11:20 PM  Result Value Ref Range Status   Specimen  Description URINE, CATHETERIZED  Final   Special Requests NONE  Final   Culture   Final    NO GROWTH 1 DAY Performed at [facility]    Report Status 11/15/2015 FINAL  Final  Respiratory virus panel     Status: Abnormal   Collection Time: 11/14/15  6:40 AM  Result Value Ref Range Status   Respiratory Syncytial Virus A  Negative Negative Final   Respiratory Syncytial Virus B Negative Negative Final   Influenza A Positive (A) Negative Final    Comment: Subtype: H3   Influenza B Negative Negative Final   Parainfluenza 1 Negative Negative Final   Parainfluenza 2 Negative Negative Final   Parainfluenza 3 Negative Negative Final   Metapneumovirus Negative Negative Final   Rhinovirus Negative Negative Final   Adenovirus Negative Negative Final    Comment: (NOTE) Performed At: [facility] [place], [place] [id number] Lindon Romp MD [id number]      Labs: Basic Metabolic Panel:  Recent Labs Lab 11/13/15 2300 11/15/15 1144 11/16/15 0614  NA 134* 133* 136  K 4.3 4.6 4.5  CL 91* 97* 98*  CO2 33* 28 31  GLUCOSE 289* 336* 260*  BUN 23* 24* 25*  CREATININE 0.87 0.76 0.75  CALCIUM 8.6* 8.5* 8.6*   Liver Function Tests:  Recent Labs Lab 11/13/15 2300  AST 15  ALT 16  ALKPHOS 66  BILITOT 0.8  PROT 7.0  ALBUMIN 3.7   No results for input(s): LIPASE, AMYLASE in the last 168 hours. No results for input(s): AMMONIA in the last 168 hours. CBC:  Recent Labs Lab 11/13/15 2300 11/15/15 1144  WBC 17.2* 11.7*  NEUTROABS 14.1* 11.0*  HGB 12.6 9.9*  HCT 41.4 31.9*  MCV 86.6 86.9  PLT 333 204   Cardiac Enzymes: No results for input(s): CKTOTAL, CKMB, CKMBINDEX, TROPONINI in the last 168 hours. BNP: BNP (last 3 results)  Recent Labs  08/17/15 2145 11/03/15 1851  BNP 961.0* 838.0*    ProBNP (last 3 results) No results for input(s): PROBNP in the last 8760 hours.  CBG:  Recent Labs Lab 11/16/15 1106 11/16/15 1639  11/16/15 2036 11/17/15 0758 11/17/15 1155  GLUCAP 376* 366* 341* 285* 398*       Signed:  [facility] Pager: [phone] 11/17/2015, 12:48 PM

## 2015-11-17 NOTE — Care Management Important Message (Signed)
Important Message  Patient Details  Name: [name] MRN: [id number] Date of Birth: 1951-08-04   Medicare Important Message Given:  Yes    Sherald Barge, RN 11/17/2015, 3:34 PM

## 2015-11-17 NOTE — Progress Notes (Signed)
Inpatient Diabetes Program Recommendations  AACE/ADA: New Consensus Statement on Inpatient Glycemic Control (2015)  Target Ranges:  Prepandial:   less than 140 mg/dL      Peak postprandial:   less than 180 mg/dL (1-2 hours)      Critically ill patients:  140 - 180 mg/dL  Results for TAREE, TERRIAN (MRN [id number]) as of 11/17/2015 09:23  Ref. Range 11/16/2015 07:43 11/16/2015 11:06 11/16/2015 16:39 11/16/2015 20:36 11/17/2015 07:58  Glucose-Capillary Latest Ref Range: 65-99 mg/dL 239 (H) 376 (H) 366 (H) 341 (H) 285 (H)   Review of Glycemic Control  Current orders for Inpatient glycemic control: Lantus 34 units QHS, Novolog 0-20 units ACHS  Inpatient Diabetes Program Recommendations: Insulin - Basal: If steroids are continued as ordered and patient is not discharged, please consider increasing Lantus to 40 units QHS. Insulin - Meal Coverage: If steroids are continued as ordered and patient is not discharged, please consider ordering Novolog 6 units TID with meals for meal coverage since post prandial glucose is consistently elevated.  Thanks, Barnie Alderman, RN, MSN, CDE Diabetes Coordinator Inpatient Diabetes Program [phone] (Team Pager from [place] to [place]) [phone] ([place] office) [phone] [place] [facility] office) [phone] [facility] office)

## 2015-11-17 NOTE — Care Management Note (Signed)
Case Management Note  Patient Details  Name: [name] MRN: [id number] Date of Birth: 06-Dec-1950   Expected Discharge Date:  11/17/15               Expected Discharge Plan:  [place]  In-House Referral:  Clinical Social Work  Discharge planning Services  CM Consult  Post Acute Care Choice:  Home Health, Durable Medical Equipment Choice offered to:  Patient  DME Arranged:  Programmer, multimedia DME Agency:  [place]:  RN, PT, Nurse's Aide [place] Agency:  [place]  Status of Service:  Completed, signed off  Medicare Important Message Given:    yes Date Medicare IM Given:    Medicare IM give by:    Date Additional Medicare IM Given:    Additional Medicare Important Message give by:     If discussed at [place] of Stay Meetings, dates discussed:    Additional Comments: Pt discharged home today with [facility] services through [facility], per pt's choice. Pt aware HH has 48 hours to initiate services. Blake Divine, of [facility] aware of DC plan and will obtain pt info from chart. No further CM needs.  Sherald Barge, RN 11/17/2015, 3:33 PM

## 2015-11-18 LAB — CULTURE, BLOOD (ROUTINE X 2)
Culture: NO GROWTH
Culture: NO GROWTH

## 2015-11-20 ENCOUNTER — Telehealth: Payer: Self-pay

## 2015-11-20 NOTE — Telephone Encounter (Signed)
Received voice message from pt's [facility] RN with request to schedule an appt. Earlier than 3/30; reported "quarter-sized area red, and draining on right AKA stump."  Attempted to return call to [facility] RN.  Left voice message that pt. Will be notified of appt. this week, to evaluate her right AKA stump incision.

## 2015-11-20 NOTE — Telephone Encounter (Signed)
Spoke with pt to schedule, dpm,

## 2015-11-21 ENCOUNTER — Encounter: Payer: Self-pay | Admitting: Family

## 2015-11-23 ENCOUNTER — Encounter: Payer: Self-pay | Admitting: Family

## 2015-11-23 ENCOUNTER — Ambulatory Visit (INDEPENDENT_AMBULATORY_CARE_PROVIDER_SITE_OTHER): Payer: Medicare Other | Admitting: Family

## 2015-11-23 VITALS — BP 114/66 | HR 63 | Temp 98.9°F | Ht 66.0 in | Wt 174.0 lb

## 2015-11-23 DIAGNOSIS — Z89611 Acquired absence of right leg above knee: Secondary | ICD-10-CM

## 2015-11-23 DIAGNOSIS — I779 Disorder of arteries and arterioles, unspecified: Secondary | ICD-10-CM

## 2015-11-23 DIAGNOSIS — E1151 Type 2 diabetes mellitus with diabetic peripheral angiopathy without gangrene: Secondary | ICD-10-CM

## 2015-11-23 DIAGNOSIS — Z87891 Personal history of nicotine dependence: Secondary | ICD-10-CM

## 2015-11-23 NOTE — Progress Notes (Signed)
    Postoperative Visit   History of Present Illness  [name] is a 66 y.o. female patient of Dr. Oneida Alar who is s/p R AKA on 10/30/15. Previous revascularization procedures include right femoral to below-knee popliteal bypass and thrombectomy of her tibial vessels for acute on chronic ischemia.She also had a fasciotomy.  Her atherosclerotic risk factors include uncontrolled DM (review of records: A1C 8.6 on 10/27/15) and former smoker until February 2017.   She has been recently hospitalized at [facility] for flu and respiratory issues; is taking prednisone to address breathing issues which has increased her blood sugar. She returns today to address a phone call from [facility] nurse re quarter sized area on R AKA stump w/ redness and drainage. Pt denies fever or chills, denies pain at right AKA stump.   The patient's right AKA stump has mild/moderate dependent edema. The incision has an area about 3 cm x 4 cm that may be the start of an ischemic area, mild erythema at incision.    For VQI Use Only  PRE-ADM LIVING: Home  AMB STATUS: Wheelchair  Physical Examination  Filed Vitals:   11/23/15 1156  BP: 114/66  Pulse: 63  Temp: 98.9 F (37.2 C)  TempSrc: Oral  Height: 5\' 6"  (1.676 m)  Weight: 174 lb (78.926 kg)  SpO2: 93%   Body mass index is 28.1 kg/(m^2).  The patient's right AKA stump has mild/moderate dependent edema. The incision has an area about 3 cm x 4 cm that may be the start of an ischemic area, mild erythema at incision.  Staples are intact.  Medical Decision Making  [name] is a 65 y.o. female who is s/p R AKA on 10/30/15. The patient's right AKA stump has mild/moderate dependent edema. The incision has an area about 3 cm x 4 cm that may be the start of an ischemic area, mild erythema at incision.  Staples removed from 3 cm x 4 cm slightly discolored area at stump incision; every other staple removed from the remainder of the incision.  Protective dressing applied,  then ace wrap compressive dressing applied to right AKA stump.  Prescription given to daughter for [employer] device that applies compression to the right AKA stump with dependent edema; preferably device will have a way to be secured to her hip or waist to prevent slippage.    Return in 3 weeks for remainder of staples removal and stump evaluation by Dr. Oneida Alar.  Pt and family advised to notify us for concerns re non healing or infection of incision, or any related concerns.  I discussed in depth with the patient the nature of atherosclerosis, and emphasized the importance of maximal medical management including strict control of blood pressure, blood glucose, and lipid levels, obtaining regular exercise, and cessation of smoking.  The patient is aware that without maximal medical management the underlying atherosclerotic disease process will progress, possibly leading to a more proximal amputation. The patient agrees to participate in their maximal medical care.   NICKEL, Sharmon Leyden, RN, MSN, FNP-C [facility] Office: [phone]  11/23/2015, 12:00 PM  Clinic MD: Oneida Alar

## 2015-11-23 NOTE — Patient Instructions (Signed)
Peripheral Vascular Disease Peripheral vascular disease (PVD) is a disease of the blood vessels that are not part of your heart and brain. A simple term for PVD is poor circulation. In most cases, PVD narrows the blood vessels that carry blood from your heart to the rest of your body. This can result in a decreased supply of blood to your arms, legs, and internal organs, like your stomach or kidneys. However, it most often affects a person's lower legs and feet. There are two types of PVD.  Organic PVD. This is the more common type. It is caused by damage to the structure of blood vessels.  Functional PVD. This is caused by conditions that make blood vessels contract and tighten (spasm). Without treatment, PVD tends to get worse over time. PVD can also lead to acute ischemic limb. This is when an arm or limb suddenly has trouble getting enough blood. This is a medical emergency. CAUSES Each type of PVD has many different causes. The most common cause of PVD is buildup of a fatty material (plaque) inside of your arteries (atherosclerosis). Small amounts of plaque can break off from the walls of the blood vessels and become lodged in a smaller artery. This blocks blood flow and can cause acute ischemic limb. Other common causes of PVD include:  Blood clots that form inside of blood vessels.  Injuries to blood vessels.  Diseases that cause inflammation of blood vessels or cause blood vessel spasms.  Health behaviors and health history that increase your risk of developing PVD. RISK FACTORS  You may have a greater risk of PVD if you:  Have a family history of PVD.  Have certain medical conditions, including:  High cholesterol.  Diabetes.  High blood pressure (hypertension).  Coronary heart disease.  Past problems with blood clots.  Past injury, such as burns or a broken bone. These may have damaged blood vessels in your limbs.  Buerger disease. This is caused by inflamed blood  vessels in your hands and feet.  Some forms of arthritis.  Rare birth defects that affect the arteries in your legs.  Use tobacco.  Do not get enough exercise.  Are obese.  Are age 50 or older. SIGNS AND SYMPTOMS  PVD may cause many different symptoms. Your symptoms depend on what part of your body is not getting enough blood. Some common signs and symptoms include:  Cramps in your lower legs. This may be a symptom of poor leg circulation (claudication).  Pain and weakness in your legs while you are physically active that goes away when you rest (intermittent claudication).  Leg pain when at rest.  Leg numbness, tingling, or weakness.  Coldness in a leg or foot, especially when compared with the other leg.  Skin or hair changes. These can include:  Hair loss.  Shiny skin.  Pale or bluish skin.  Thick toenails.  Inability to get or maintain an erection (erectile dysfunction). People with PVD are more prone to developing ulcers and sores on their toes, feet, or legs. These may take longer than normal to heal. DIAGNOSIS Your health care provider may diagnose PVD from your signs and symptoms. The health care provider will also do a physical exam. You may have tests to find out what is causing your PVD and determine its severity. Tests may include:  Blood pressure recordings from your arms and legs and measurements of the strength of your pulses (pulse volume recordings).  Imaging studies using sound waves to take pictures of   the blood flow through your blood vessels (Doppler ultrasound).  Injecting a dye into your blood vessels before having imaging studies using:  X-rays (angiogram or arteriogram).  Computer-generated X-rays (CT angiogram).  A powerful electromagnetic field and a computer (magnetic resonance angiogram or MRA). TREATMENT Treatment for PVD depends on the cause of your condition and the severity of your symptoms. It also depends on your age. Underlying  causes need to be treated and controlled. These include long-lasting (chronic) conditions, such as diabetes, high cholesterol, and high blood pressure. You may need to first try making lifestyle changes and taking medicines. Surgery may be needed if these do not work. Lifestyle changes may include:  Quitting smoking.  Exercising regularly.  Following a low-fat, low-cholesterol diet. Medicines may include:  Blood thinners to prevent blood clots.  Medicines to improve blood flow.  Medicines to improve your blood cholesterol levels. Surgical procedures may include:  A procedure that uses an inflated balloon to open a blocked artery and improve blood flow (angioplasty).  A procedure to put in a tube (stent) to keep a blocked artery open (stent implant).  Surgery to reroute blood flow around a blocked artery (peripheral bypass surgery).  Surgery to remove dead tissue from an infected wound on the affected limb.  Amputation. This is surgical removal of the affected limb. This may be necessary in cases of acute ischemic limb that are not improved through medical or surgical treatments. HOME CARE INSTRUCTIONS  Take medicines only as directed by your health care provider.  Do not use any tobacco products, including cigarettes, chewing tobacco, or electronic cigarettes. If you need help quitting, ask your health care provider.  Lose weight if you are overweight, and maintain a healthy weight as directed by your health care provider.  Eat a diet that is low in fat and cholesterol. If you need help, ask your health care provider.  Exercise regularly. Ask your health care provider to suggest some good activities for you.  Use compression stockings or other mechanical devices as directed by your health care provider.  Take good care of your feet.  Wear comfortable shoes that fit well.  Check your feet often for any cuts or sores. SEEK MEDICAL CARE IF:  You have cramps in your legs  while walking.  You have leg pain when you are at rest.  You have coldness in a leg or foot.  Your skin changes.  You have erectile dysfunction.  You have cuts or sores on your feet that are not healing. SEEK IMMEDIATE MEDICAL CARE IF:  Your arm or leg turns cold and blue.  Your arms or legs become red, warm, swollen, painful, or numb.  You have chest pain or trouble breathing.  You suddenly have weakness in your face, arm, or leg.  You become very confused or lose the ability to speak.  You suddenly have a very bad headache or lose your vision.   This information is not intended to replace advice given to you by your health care provider. Make sure you discuss any questions you have with your health care provider.   Document Released: 10/10/2004 Document Revised: 09/23/2014 Document Reviewed: 02/10/2014 Elsevier Interactive Patient Education 2016 [employer] Inc.  

## 2015-11-27 ENCOUNTER — Telehealth: Payer: Self-pay

## 2015-11-27 NOTE — Telephone Encounter (Signed)
Pt's HH RN called from pt's home to report that since the pt. Has been home from the hospital, the family is overwhelmed with her care.  Is requesting to admit her to [facility] in [place].  Reported that the PCP has been notified of this request, but has not responded to the phone call.  Also reported the right AKA incision has slight erythema and warmth.  Stated the incision is intact, and the remainder of the staples are intact.  Reported small amt. serous drainage.  Advised the [facility] RN of the assessment of the nurse practitioner on 3/9:  "right AKA stump has mild/moderate dependent edema. The incision has an area about 3 cm x 4 cm. that may be the start of an ischemic area, mild erythema at incision. Staples are intact." Advised to continue daily dressing changes and report worsening symptoms of right stump.  Encouraged to await PCP recommendation for being admitted to nursing facility. Verb. Understanding.

## 2015-12-06 ENCOUNTER — Encounter: Payer: Self-pay | Admitting: Vascular Surgery

## 2015-12-07 ENCOUNTER — Encounter: Payer: Medicare Other | Admitting: Vascular Surgery

## 2015-12-14 ENCOUNTER — Encounter: Payer: Self-pay | Admitting: Vascular Surgery

## 2015-12-14 ENCOUNTER — Ambulatory Visit (INDEPENDENT_AMBULATORY_CARE_PROVIDER_SITE_OTHER): Payer: Medicare Other | Admitting: Vascular Surgery

## 2015-12-14 VITALS — BP 77/49 | HR 56 | Temp 98.2°F | Ht 66.0 in | Wt 174.0 lb

## 2015-12-14 DIAGNOSIS — I739 Peripheral vascular disease, unspecified: Secondary | ICD-10-CM

## 2015-12-15 NOTE — Progress Notes (Signed)
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 She reports no incisional drainage. She is currently living at a skilled nursing facility but expects discharge next week. She has no real problems in the left leg although she did have an ulcer and a recent episode of cellulitis on the left side.   ASSESSMENT: Healing right leg above-knee amputation.  PLAN:  remainder of staples removed today. Shrinker prescribed to mold the stump on the right leg. The patient will follow-up on as-needed basis.   Ruta Hinds, MD [facility] [place] Office: [phone] Pager: [phone]

## 2016-01-07 ENCOUNTER — Inpatient Hospital Stay (HOSPITAL_COMMUNITY)
Admission: EM | Admit: 2016-01-07 | Discharge: 2016-01-12 | DRG: 190 | Disposition: A | Discharge status: Home Health | Payer: Medicare Other | Attending: Family Medicine | Admitting: Family Medicine

## 2016-01-07 ENCOUNTER — Encounter (HOSPITAL_COMMUNITY): Payer: Self-pay

## 2016-01-07 ENCOUNTER — Emergency Department (HOSPITAL_COMMUNITY): Payer: Medicare Other

## 2016-01-07 DIAGNOSIS — E785 Hyperlipidemia, unspecified: Secondary | ICD-10-CM | POA: Diagnosis present

## 2016-01-07 DIAGNOSIS — Z79891 Long term (current) use of opiate analgesic: Secondary | ICD-10-CM

## 2016-01-07 DIAGNOSIS — K219 Gastro-esophageal reflux disease without esophagitis: Secondary | ICD-10-CM | POA: Diagnosis present

## 2016-01-07 DIAGNOSIS — Z823 Family history of stroke: Secondary | ICD-10-CM

## 2016-01-07 DIAGNOSIS — G8929 Other chronic pain: Secondary | ICD-10-CM

## 2016-01-07 DIAGNOSIS — Z7982 Long term (current) use of aspirin: Secondary | ICD-10-CM

## 2016-01-07 DIAGNOSIS — Z89619 Acquired absence of unspecified leg above knee: Secondary | ICD-10-CM

## 2016-01-07 DIAGNOSIS — T380X5A Adverse effect of glucocorticoids and synthetic analogues, initial encounter: Secondary | ICD-10-CM | POA: Diagnosis present

## 2016-01-07 DIAGNOSIS — Z7952 Long term (current) use of systemic steroids: Secondary | ICD-10-CM

## 2016-01-07 DIAGNOSIS — R0902 Hypoxemia: Secondary | ICD-10-CM | POA: Diagnosis not present

## 2016-01-07 DIAGNOSIS — Z794 Long term (current) use of insulin: Secondary | ICD-10-CM

## 2016-01-07 DIAGNOSIS — J441 Chronic obstructive pulmonary disease with (acute) exacerbation: Principal | ICD-10-CM | POA: Diagnosis present

## 2016-01-07 DIAGNOSIS — Z87891 Personal history of nicotine dependence: Secondary | ICD-10-CM

## 2016-01-07 DIAGNOSIS — J410 Simple chronic bronchitis: Secondary | ICD-10-CM

## 2016-01-07 DIAGNOSIS — M545 Low back pain, unspecified: Secondary | ICD-10-CM

## 2016-01-07 DIAGNOSIS — E1165 Type 2 diabetes mellitus with hyperglycemia: Secondary | ICD-10-CM | POA: Diagnosis present

## 2016-01-07 DIAGNOSIS — I10 Essential (primary) hypertension: Secondary | ICD-10-CM | POA: Diagnosis present

## 2016-01-07 DIAGNOSIS — I5032 Chronic diastolic (congestive) heart failure: Secondary | ICD-10-CM | POA: Diagnosis present

## 2016-01-07 DIAGNOSIS — I110 Hypertensive heart disease with heart failure: Secondary | ICD-10-CM | POA: Diagnosis present

## 2016-01-07 DIAGNOSIS — R0602 Shortness of breath: Secondary | ICD-10-CM | POA: Diagnosis present

## 2016-01-07 DIAGNOSIS — J9601 Acute respiratory failure with hypoxia: Secondary | ICD-10-CM | POA: Diagnosis present

## 2016-01-07 DIAGNOSIS — F419 Anxiety disorder, unspecified: Secondary | ICD-10-CM | POA: Diagnosis present

## 2016-01-07 DIAGNOSIS — Z89611 Acquired absence of right leg above knee: Secondary | ICD-10-CM

## 2016-01-07 DIAGNOSIS — Z8249 Family history of ischemic heart disease and other diseases of the circulatory system: Secondary | ICD-10-CM

## 2016-01-07 DIAGNOSIS — E1142 Type 2 diabetes mellitus with diabetic polyneuropathy: Secondary | ICD-10-CM | POA: Diagnosis present

## 2016-01-07 DIAGNOSIS — I509 Heart failure, unspecified: Secondary | ICD-10-CM

## 2016-01-07 DIAGNOSIS — I998 Other disorder of circulatory system: Secondary | ICD-10-CM

## 2016-01-07 DIAGNOSIS — Z809 Family history of malignant neoplasm, unspecified: Secondary | ICD-10-CM

## 2016-01-07 LAB — CBC WITH DIFFERENTIAL/PLATELET
BASOS ABS: 0 10*3/uL (ref 0.0–0.1)
BASOS PCT: 0 %
Eosinophils Absolute: 0.2 10*3/uL (ref 0.0–0.7)
Eosinophils Relative: 3 %
HEMATOCRIT: 32.6 % — AB (ref 36.0–46.0)
Hemoglobin: 10.1 g/dL — ABNORMAL LOW (ref 12.0–15.0)
Lymphocytes Relative: 36 %
Lymphs Abs: 3.1 10*3/uL (ref 0.7–4.0)
MCH: 27.1 pg (ref 26.0–34.0)
MCHC: 31 g/dL (ref 30.0–36.0)
MCV: 87.4 fL (ref 78.0–100.0)
MONO ABS: 1.1 10*3/uL — AB (ref 0.1–1.0)
Monocytes Relative: 12 %
NEUTROS ABS: 4.2 10*3/uL (ref 1.7–7.7)
NEUTROS PCT: 49 %
Platelets: 275 10*3/uL (ref 150–400)
RBC: 3.73 MIL/uL — ABNORMAL LOW (ref 3.87–5.11)
RDW: 17.6 % — AB (ref 11.5–15.5)
WBC: 8.6 10*3/uL (ref 4.0–10.5)

## 2016-01-07 LAB — BASIC METABOLIC PANEL
Anion gap: 8 (ref 5–15)
BUN: 12 mg/dL (ref 6–20)
CHLORIDE: 105 mmol/L (ref 101–111)
CO2: 26 mmol/L (ref 22–32)
CREATININE: 0.93 mg/dL (ref 0.44–1.00)
Calcium: 9 mg/dL (ref 8.9–10.3)
GFR calc non Af Amer: >60 mL/min (ref >60)
Glucose, Bld: 178 mg/dL — ABNORMAL HIGH (ref 65–99)
POTASSIUM: 3.9 mmol/L (ref 3.5–5.1)
SODIUM: 139 mmol/L (ref 135–145)

## 2016-01-07 LAB — BRAIN NATRIURETIC PEPTIDE: B NATRIURETIC PEPTIDE 5: 527 pg/mL — AB (ref 0.0–100.0)

## 2016-01-07 LAB — TROPONIN I

## 2016-01-07 MED ORDER — METHYLPREDNISOLONE SODIUM SUCC 125 MG IJ SOLR
125.0000 mg | Freq: Once | INTRAMUSCULAR | Status: AC
  Administered 2016-01-07: 125 mg via INTRAVENOUS
  Filled 2016-01-07: qty 2

## 2016-01-07 MED ORDER — IPRATROPIUM BROMIDE 0.02 % IN SOLN
1.0000 mg | Freq: Once | RESPIRATORY_TRACT | Status: AC
  Administered 2016-01-07: 1 mg via RESPIRATORY_TRACT
  Filled 2016-01-07: qty 5

## 2016-01-07 MED ORDER — ALBUTEROL (5 MG/ML) CONTINUOUS INHALATION SOLN
10.0000 mg/h | INHALATION_SOLUTION | Freq: Once | RESPIRATORY_TRACT | Status: AC
  Administered 2016-01-07: 10 mg/h via RESPIRATORY_TRACT
  Filled 2016-01-07: qty 20

## 2016-01-07 NOTE — ED Notes (Signed)
She had a breathing treatment around 5 pm and has become worse since. Having shortness of breath now.

## 2016-01-07 NOTE — ED Provider Notes (Signed)
CSN: [id number]     Arrival date & time 01/07/16  2106 History   First MD Initiated Contact with Patient 01/07/16 2134     Chief Complaint  Patient presents with  . Shortness of Breath      HPI  Pt was seen at 2135.  Per pt and her family, c/o gradual onset and worsening of persistent cough, wheezing and SOB for the past several days, worse since yesterday.  Has been using home MDI and nebs without relief. Pt's family states pt's O2 Sat at home was "84%" on R/A.  Denies CP/palpitations, no back pain, no abd pain, no N/V/D, no fevers, no rash.     Past Medical History  Diagnosis Date  . Hypertension   . COPD (chronic obstructive pulmonary disease) ([place])   . Stroke ([place])   . Chronic lower back pain   . Shingles Dec. 2016    Scalp  . Peripheral vascular disease ([place])   . GERD (gastroesophageal reflux disease)   . Hyperlipidemia   . Anxiety   . Type II diabetes mellitus ([place])   . Arthritis     "back" (10/30/2015)  . CHF (congestive heart failure) ([place])   . Hypoxia    Past Surgical History  Procedure Laterality Date  . Posterior lumbar fusion    . Foot surgery Bilateral     "from standing on concrete all day"  . Peripheral vascular catheterization N/A 04/10/2015    Procedure: Abdominal Aortogram w/Lower Extremity;  Surgeon: Conrad [place], MD;  Location: [place] [facility];  Service: Cardiovascular;  Laterality: N/A;  . Thrombectomy femoral artery Right 04/10/2015    Procedure: THROMBECTOMY RIGHT TIBIAL  ARTERY WITH VEIN PATCH ANGIOPLASTY;  Surgeon: Elam Dutch, MD;  Location: [facility];  Service: Vascular;  Laterality: Right;  . Fasciotomy Right 04/10/2015    Procedure: FOUR COMPARTMENT FASCIOTOMY;  Surgeon: Elam Dutch, MD;  [facility];  Service: Vascular;  Laterality: Right;  . Femoral-popliteal bypass graft Right 04/10/2015    Procedure: RIGHT  FEMORAL-BELOW THE KNEE POPLITEAL ARTERY BYPASS GRAFT USING 6 MM X 80 CM PROPATEN GRAFT;  Surgeon: Elam Dutch, MD;   Location: [facility];  Service: Vascular;  Laterality: Right;  . Intraoperative arteriogram Right 04/10/2015    Procedure: INTRA OPERATIVE ARTERIOGRAM;  Surgeon: Elam Dutch, MD;  Location: [facility];  Service: Vascular;  Laterality: Right;  . Carotid endarterectomy Right 2010    Dr Kellie Simmering   . Peripheral vascular catheterization N/A 10/20/2015    Procedure: Abdominal Aortogram;  Surgeon: Elam Dutch, MD;  Location: [facility];  Service: Cardiovascular;  Laterality: N/A;  . Laparoscopic cholecystectomy    . Back surgery    . Carotid endarterectomy Left 03/2008    Archie Endo 01/15/2011  . Shoulder open rotator cuff repair Right 02/2006    Archie Endo 01/29/2011  . Cardiac catheterization    . 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 Amputation Right 10/30/2015    Procedure: RIGHT ABOVE KNEE AMPUTATION;  Surgeon: Elam Dutch, MD;  Location: [facility] OR;  Service: Vascular;  Laterality: Right;   Family History  Problem Relation Age of Onset  . Cancer Father   . Heart attack Sister   . Stroke Sister   . Hypertension Sister   . Hypertension Brother    Social History  Substance Use Topics  . Smoking status: Former Smoker -- 0.12 packs/day for 49 years    Types: Cigarettes  . Smokeless tobacco: Never Used  .  Alcohol Use: No   OB History    Gravida Para Term Preterm AB TAB SAB Ectopic Multiple Living   3 3 3       3      Review of Systems ROS: Statement: All systems negative except as marked or noted in the HPI; Constitutional: Negative for fever and chills. ; ; Eyes: Negative for eye pain, redness and discharge. ; ; ENMT: Negative for ear pain, hoarseness, nasal congestion, sinus pressure and sore throat. ; ; Cardiovascular: Negative for chest pain, palpitations, diaphoresis, and peripheral edema. ; ; Respiratory: +cough, wheezing, SOB. Negative for stridor. ; ; Gastrointestinal: Negative for nausea, vomiting, diarrhea, abdominal pain, blood in stool, hematemesis, jaundice and rectal  bleeding. . ; ; Genitourinary: Negative for dysuria, flank pain and hematuria. ; ; Musculoskeletal: Negative for back pain and neck pain. Negative for swelling and trauma.; ; Skin: Negative for pruritus, rash, abrasions, blisters, bruising and skin lesion.; ; Neuro: Negative for headache, lightheadedness and neck stiffness. Negative for weakness, altered level of consciousness , altered mental status, extremity weakness, paresthesias, involuntary movement, seizure and syncope.      Allergies  Codeine; Penicillins; and Ciprofloxacin  Home Medications   Prior to Admission medications   Medication Sig Start Date End Date Taking? Authorizing Provider  albuterol (PROVENTIL HFA;VENTOLIN HFA) 108 (90 Base) MCG/ACT inhaler Inhale 2 puffs into the lungs every 6 (six) hours as needed for wheezing or shortness of breath.    Historical Provider, MD  ALPRAZolam Duanne Moron) 0.25 MG tablet Take 0.25 mg by mouth 4 (four) times daily as needed for anxiety.     Historical Provider, MD  aspirin 325 MG tablet Take 1 tablet (325 mg total) by mouth daily. 04/15/15   Thurnell Lose, MD  atorvastatin (LIPITOR) 20 MG tablet Take 1 tablet (20 mg total) by mouth daily at 6 PM. 04/15/15   Thurnell Lose, MD  citalopram (CELEXA) 20 MG tablet Take 20 mg by mouth daily. 07/04/13   Historical Provider, MD  ferrous sulfate 325 (65 FE) MG tablet Take 1 tablet (325 mg total) by mouth daily with breakfast. 11/09/15   Lucia Gaskins, MD  gabapentin (NEURONTIN) 300 MG capsule Take 300 mg by mouth 3 (three) times daily.    Historical Provider, MD  insulin aspart (NOVOLOG) 100 UNIT/ML injection Inject 2-10 Units into the skin 3 (three) times daily before meals. 151-200= 2 units 201-250= 4 units 251-300= 6 units 301-350= 8 units 351-400=10units If greater than 400-Call NP/Dr    Historical Provider, MD  Insulin Glargine (LANTUS SOLOSTAR) 100 UNIT/ML Solostar Pen Inject 34 Units into the skin at bedtime.    Historical Provider, MD   ipratropium-albuterol (DUONEB) 0.5-2.5 (3) MG/3ML SOLN Take 3 mLs by nebulization every 8 (eight) hours.     Historical Provider, MD  lisinopril (PRINIVIL,ZESTRIL) 20 MG tablet Take 1 tablet (20 mg total) by mouth daily. 11/04/15   Alvia Grove, PA-C  metFORMIN (GLUCOPHAGE) 500 MG tablet Take 500 mg by mouth 2 (two) times daily with a meal.    Historical Provider, MD  metoprolol tartrate (LOPRESSOR) 25 MG tablet Take 25 mg by mouth 2 (two) times daily.     Historical Provider, MD  naproxen (NAPROSYN) 500 MG tablet Take 500 mg by mouth 2 (two) times daily as needed for mild pain or moderate pain.  03/16/15   Historical Provider, MD  oxyCODONE 10 MG TABS Take 1 tablet (10 mg total) by mouth every 4 (four) hours as needed for moderate  pain. 11/09/15   Lucia Gaskins, MD  pantoprazole (PROTONIX) 40 MG tablet Take 40 mg by mouth daily.    Historical Provider, MD  predniSONE (DELTASONE) 20 MG tablet Take 1 tablet (20 mg total) by mouth 2 (two) times daily with a meal. 11/09/15   Lucia Gaskins, MD  varenicline (CHANTIX) 0.5 MG tablet Take 2 tablets (1 mg total) by mouth 2 (two) times daily. Patient not taking: Reported on 11/23/2015 08/23/15   Lucia Gaskins, MD   BP 142/85 mmHg  Pulse 123  Temp(Src) 98.3 F (36.8 C) (Oral)  Resp 20  Ht 5\' 5"  (1.651 m)  Wt 170 lb (77.111 kg)  BMI 28.29 kg/m2  SpO2 100% Physical Exam  2140: Physical examination:  Nursing notes reviewed; Vital signs and O2 SAT reviewed;  Constitutional: Well developed, Well nourished, Well hydrated, Uncomfortable appearing.; Head:  Normocephalic, atraumatic; Eyes: EOMI, PERRL, No scleral icterus; ENMT: Mouth and pharynx normal, Mucous membranes moist; Neck: Supple, Full range of motion, No lymphadenopathy; Cardiovascular: Tachycardic rate and rhythm, No gallop; Respiratory: Breath sounds coarse & equal bilaterally, insp/exp wheezes bilat with occasional audible wheezing. Speaking short sentences, Tachypneic.; Chest: Nontender,  Movement normal; Abdomen: Soft, Nontender, Nondistended, Normal bowel sounds; Genitourinary: No CVA tenderness; Extremities: Pulses normal, No tenderness, +1 pedal edema LLE. +RLE amputation..; Neuro: AA&Ox3, Major CN grossly intact.  Speech clear. No gross focal motor or sensory deficits in extremities.; Skin: Color normal, Warm, Dry.   ED Course  Procedures (including critical care time) Labs Review  Imaging Review  I have personally reviewed and evaluated these images and lab results as part of my medical decision-making.   EKG Interpretation   Date/Time:  Sunday January 07 2016 21:20:53 EDT Ventricular Rate:  122 PR Interval:  148 QRS Duration: 125 QT Interval:  384 QTC Calculation: 547 R Axis:   -71 Text Interpretation:  Sinus tachycardia Nonspecific IVCD with LAD Probable  lateral infarct, age indeterminate Prolonged QT Baseline wander When  compared with ECG of 11/13/2015 No significant change was found Confirmed  by [facility]  MD, Nunzio Cory [phone]) on 01/07/2016 10:18:04 PM      MDM  MDM Reviewed: previous chart, nursing note and vitals Reviewed previous: labs and ECG Interpretation: labs, ECG and x-ray Total time providing critical care: 30-74 minutes. This excludes time spent performing separately reportable procedures and services. Consults: admitting MD   CRITICAL CARE Performed by: Alfonzo Feller Total critical care time: 35 minutes Critical care time was exclusive of separately billable procedures and treating other patients. Critical care was necessary to treat or prevent imminent or life-threatening deterioration. Critical care was time spent personally by me on the following activities: development of treatment plan with patient and/or surrogate as well as nursing, discussions with consultants, evaluation of patient's response to treatment, examination of patient, obtaining history from patient or surrogate, ordering and performing treatments and interventions,  ordering and review of laboratory studies, ordering and review of radiographic studies, pulse oximetry and re-evaluation of patient's condition.   Results for orders placed or performed during the hospital encounter of [id number]  Basic metabolic panel  Result Value Ref Range   Sodium 139 135 - 145 mmol/L   Potassium 3.9 3.5 - 5.1 mmol/L   Chloride 105 101 - 111 mmol/L   CO2 26 22 - 32 mmol/L   Glucose, Bld 178 (H) 65 - 99 mg/dL   BUN 12 6 - 20 mg/dL   Creatinine, Ser 0.93 0.44 - 1.00 mg/dL   Calcium 9.0 8.9 - 10.3  mg/dL   GFR calc non Af Amer >60 >60 mL/min   GFR calc Af Amer >60 >60 mL/min   Anion gap 8 5 - 15  Troponin I  Result Value Ref Range   Troponin I <0.03 <0.031 ng/mL  Brain natriuretic peptide  Result Value Ref Range   B Natriuretic Peptide 527.0 (H) 0.0 - 100.0 pg/mL  CBC with Differential  Result Value Ref Range   WBC 8.6 4.0 - 10.5 K/uL   RBC 3.73 (L) 3.87 - 5.11 MIL/uL   Hemoglobin 10.1 (L) 12.0 - 15.0 g/dL   HCT 32.6 (L) 36.0 - 46.0 %   MCV 87.4 78.0 - 100.0 fL   MCH 27.1 26.0 - 34.0 pg   MCHC 31.0 30.0 - 36.0 g/dL   RDW 17.6 (H) 11.5 - 15.5 %   Platelets 275 150 - 400 K/uL   Neutrophils Relative % 49 %   Neutro Abs 4.2 1.7 - 7.7 K/uL   Lymphocytes Relative 36 %   Lymphs Abs 3.1 0.7 - 4.0 K/uL   Monocytes Relative 12 %   Monocytes Absolute 1.1 (H) 0.1 - 1.0 K/uL   Eosinophils Relative 3 %   Eosinophils Absolute 0.2 0.0 - 0.7 K/uL   Basophils Relative 0 %   Basophils Absolute 0.0 0.0 - 0.1 K/uL   Dg Chest Port 1 View 01/07/2016  CLINICAL DATA:  Shortness of breath and wheezing EXAM: PORTABLE CHEST 1 VIEW COMPARISON:  11/13/2015 FINDINGS: Chronic hyperinflation and emphysematous changes. Chronic cardiomegaly. Interstitial coarsening and hazy bibasilar lung opacity. Possible small effusions. No pneumothorax. IMPRESSION: 1. Interstitial lung opacities, likely CHF. 2. Background emphysema. Electronically Signed   By: Monte Fantasia M.D.   On: 01/07/2016 22:19     0005:  On arrival: pt sitting upright, tachypneic, tachycardic, Sats 87 % R/A, lungs diminished with wheezing. IV solumedrol and hour long neb started. After neb: pt appears more comfortable at rest, less tachypneic, Sats 97-98 % on R/S, lungs continue diminished, faint scattered wheezing. Pt does not ambulate at home, but does stand-pivot to transfer from bed to wheelchair, etc. Pt mimicked her home activity while in the ED:  Pt O2 Sats dropped to 85-86 % R/A with pt c/o increasing SOB, with increasing HR and RR and audible wheezing. Pt sat back on stretcher and rested, with O2 Sats slowly increasing to 96 % on O2 3L N/C and RR decreasing. Pt persistently tachycardic since neb tx (110's-120's). Denies CP/palpitations, no fever. Dx and testing d/w pt and family.  Questions answered.  Verb understanding, agreeable to admit. T/C to [facility] Dr. Shanon Brow, case discussed, including:  HPI, pertinent PM/SHx, VS/PE, dx testing, ED course and treatment:  Agreeable to admit, requests to write temporary orders, obtain observation medical bed to Dr. Denita Lung service.    Francine Graven, DO 01/10/16 1503

## 2016-01-07 NOTE — ED Notes (Signed)
Per Pt she was hospitalized for pneumonia recently and was discharged from [facility] approx a week and a half ago. Has hx of COPD.

## 2016-01-08 ENCOUNTER — Encounter (HOSPITAL_COMMUNITY): Payer: Self-pay | Admitting: *Deleted

## 2016-01-08 DIAGNOSIS — E1142 Type 2 diabetes mellitus with diabetic polyneuropathy: Secondary | ICD-10-CM | POA: Diagnosis present

## 2016-01-08 DIAGNOSIS — Z794 Long term (current) use of insulin: Secondary | ICD-10-CM | POA: Diagnosis not present

## 2016-01-08 DIAGNOSIS — G8929 Other chronic pain: Secondary | ICD-10-CM

## 2016-01-08 DIAGNOSIS — R0602 Shortness of breath: Secondary | ICD-10-CM

## 2016-01-08 DIAGNOSIS — R0902 Hypoxemia: Secondary | ICD-10-CM | POA: Diagnosis present

## 2016-01-08 DIAGNOSIS — F419 Anxiety disorder, unspecified: Secondary | ICD-10-CM | POA: Diagnosis present

## 2016-01-08 DIAGNOSIS — E785 Hyperlipidemia, unspecified: Secondary | ICD-10-CM | POA: Diagnosis present

## 2016-01-08 DIAGNOSIS — E1165 Type 2 diabetes mellitus with hyperglycemia: Secondary | ICD-10-CM | POA: Diagnosis present

## 2016-01-08 DIAGNOSIS — J441 Chronic obstructive pulmonary disease with (acute) exacerbation: Secondary | ICD-10-CM | POA: Diagnosis present

## 2016-01-08 DIAGNOSIS — Z7982 Long term (current) use of aspirin: Secondary | ICD-10-CM | POA: Diagnosis not present

## 2016-01-08 DIAGNOSIS — Z809 Family history of malignant neoplasm, unspecified: Secondary | ICD-10-CM | POA: Diagnosis not present

## 2016-01-08 DIAGNOSIS — K219 Gastro-esophageal reflux disease without esophagitis: Secondary | ICD-10-CM | POA: Diagnosis present

## 2016-01-08 DIAGNOSIS — M545 Low back pain: Secondary | ICD-10-CM

## 2016-01-08 DIAGNOSIS — J9601 Acute respiratory failure with hypoxia: Secondary | ICD-10-CM | POA: Diagnosis present

## 2016-01-08 DIAGNOSIS — Z8249 Family history of ischemic heart disease and other diseases of the circulatory system: Secondary | ICD-10-CM | POA: Diagnosis not present

## 2016-01-08 DIAGNOSIS — Z89619 Acquired absence of unspecified leg above knee: Secondary | ICD-10-CM | POA: Diagnosis not present

## 2016-01-08 DIAGNOSIS — Z87891 Personal history of nicotine dependence: Secondary | ICD-10-CM | POA: Diagnosis not present

## 2016-01-08 DIAGNOSIS — I110 Hypertensive heart disease with heart failure: Secondary | ICD-10-CM | POA: Diagnosis present

## 2016-01-08 DIAGNOSIS — Z79891 Long term (current) use of opiate analgesic: Secondary | ICD-10-CM | POA: Diagnosis not present

## 2016-01-08 DIAGNOSIS — Z823 Family history of stroke: Secondary | ICD-10-CM | POA: Diagnosis not present

## 2016-01-08 DIAGNOSIS — T380X5A Adverse effect of glucocorticoids and synthetic analogues, initial encounter: Secondary | ICD-10-CM | POA: Diagnosis present

## 2016-01-08 DIAGNOSIS — Z7952 Long term (current) use of systemic steroids: Secondary | ICD-10-CM | POA: Diagnosis not present

## 2016-01-08 DIAGNOSIS — I5032 Chronic diastolic (congestive) heart failure: Secondary | ICD-10-CM | POA: Diagnosis present

## 2016-01-08 LAB — GLUCOSE, CAPILLARY
GLUCOSE-CAPILLARY: 366 mg/dL — AB (ref 65–99)
GLUCOSE-CAPILLARY: 271 mg/dL — AB (ref 65–99)
Glucose-Capillary: 404 mg/dL — ABNORMAL HIGH (ref 65–99)
Glucose-Capillary: 370 mg/dL — ABNORMAL HIGH (ref 65–99)

## 2016-01-08 LAB — BASIC METABOLIC PANEL
ANION GAP: 13 (ref 5–15)
BUN: 14 mg/dL (ref 6–20)
CO2: 22 mmol/L (ref 22–32)
Calcium: 9 mg/dL (ref 8.9–10.3)
Chloride: 105 mmol/L (ref 101–111)
Creatinine, Ser: 1.05 mg/dL — ABNORMAL HIGH (ref 0.44–1.00)
GFR calc Af Amer: >60 mL/min (ref >60)
GFR, EST NON AFRICAN AMERICAN: 55 mL/min — AB (ref >60)
Glucose, Bld: 434 mg/dL — ABNORMAL HIGH (ref 65–99)
POTASSIUM: 3.9 mmol/L (ref 3.5–5.1)
SODIUM: 140 mmol/L (ref 135–145)

## 2016-01-08 LAB — CBC
HEMATOCRIT: 31.6 % — AB (ref 36.0–46.0)
HEMOGLOBIN: 9.5 g/dL — AB (ref 12.0–15.0)
MCH: 26.6 pg (ref 26.0–34.0)
MCHC: 30.1 g/dL (ref 30.0–36.0)
MCV: 88.5 fL (ref 78.0–100.0)
Platelets: 256 10*3/uL (ref 150–400)
RBC: 3.57 MIL/uL — ABNORMAL LOW (ref 3.87–5.11)
RDW: 17.7 % — AB (ref 11.5–15.5)
WBC: 6.5 10*3/uL (ref 4.0–10.5)

## 2016-01-08 MED ORDER — ALPRAZOLAM 0.5 MG PO TABS
0.5000 mg | ORAL_TABLET | Freq: Two times a day (BID) | ORAL | Status: DC
  Administered 2016-01-08 – 2016-01-12 (×9): 0.5 mg via ORAL
  Filled 2016-01-08 (×9): qty 1

## 2016-01-08 MED ORDER — IPRATROPIUM BROMIDE 0.02 % IN SOLN
0.5000 mg | Freq: Four times a day (QID) | RESPIRATORY_TRACT | Status: DC
Start: 2016-01-08 — End: 2016-01-09
  Administered 2016-01-08 – 2016-01-09 (×7): 0.5 mg via RESPIRATORY_TRACT
  Filled 2016-01-08 (×7): qty 2.5

## 2016-01-08 MED ORDER — METHYLPREDNISOLONE SODIUM SUCC 125 MG IJ SOLR
80.0000 mg | Freq: Two times a day (BID) | INTRAMUSCULAR | Status: DC
  Administered 2016-01-08 – 2016-01-11 (×8): 80 mg via INTRAVENOUS
  Filled 2016-01-08 (×8): qty 2

## 2016-01-08 MED ORDER — INSULIN GLARGINE 100 UNIT/ML ~~LOC~~ SOLN
12.0000 [IU] | Freq: Once | SUBCUTANEOUS | Status: AC
  Administered 2016-01-08: 12 [IU] via SUBCUTANEOUS
  Filled 2016-01-08: qty 0.12

## 2016-01-08 MED ORDER — ALBUTEROL SULFATE (2.5 MG/3ML) 0.083% IN NEBU: 2.5000 mg | INHALATION_SOLUTION | RESPIRATORY_TRACT | Status: DC

## 2016-01-08 MED ORDER — SODIUM CHLORIDE 0.9% FLUSH: 3.0000 mL | INTRAVENOUS | Status: DC | PRN

## 2016-01-08 MED ORDER — INSULIN GLARGINE 100 UNIT/ML ~~LOC~~ SOLN
34.0000 [IU] | Freq: Every day | SUBCUTANEOUS | Status: DC
  Administered 2016-01-08 – 2016-01-11 (×4): 34 [IU] via SUBCUTANEOUS
  Filled 2016-01-08 (×7): qty 0.34

## 2016-01-08 MED ORDER — LEVALBUTEROL HCL 0.63 MG/3ML IN NEBU
0.6300 mg | INHALATION_SOLUTION | Freq: Four times a day (QID) | RESPIRATORY_TRACT | Status: DC
  Administered 2016-01-08 – 2016-01-09 (×7): 0.63 mg via RESPIRATORY_TRACT
  Filled 2016-01-08 (×7): qty 3

## 2016-01-08 MED ORDER — INSULIN GLARGINE 100 UNIT/ML ~~LOC~~ SOLN
25.0000 [IU] | Freq: Every day | SUBCUTANEOUS | Status: DC
  Filled 2016-01-08: qty 0.25

## 2016-01-08 MED ORDER — OXYCODONE HCL 5 MG PO TABS
10.0000 mg | ORAL_TABLET | Freq: Four times a day (QID) | ORAL | Status: DC | PRN
  Administered 2016-01-08 – 2016-01-12 (×10): 10 mg via ORAL
  Filled 2016-01-08 (×10): qty 2

## 2016-01-08 MED ORDER — INSULIN ASPART 100 UNIT/ML ~~LOC~~ SOLN
0.0000 [IU] | Freq: Every day | SUBCUTANEOUS | Status: DC
  Administered 2016-01-08: 5 [IU] via SUBCUTANEOUS

## 2016-01-08 MED ORDER — LEVALBUTEROL HCL 0.63 MG/3ML IN NEBU
0.6300 mg | INHALATION_SOLUTION | Freq: Four times a day (QID) | RESPIRATORY_TRACT | Status: DC | PRN
Start: 2016-01-08 — End: 2016-01-12

## 2016-01-08 MED ORDER — SODIUM CHLORIDE 0.9% FLUSH
3.0000 mL | Freq: Two times a day (BID) | INTRAVENOUS | Status: DC
  Administered 2016-01-08 – 2016-01-11 (×9): 3 mL via INTRAVENOUS

## 2016-01-08 MED ORDER — INSULIN ASPART 100 UNIT/ML ~~LOC~~ SOLN
0.0000 [IU] | Freq: Three times a day (TID) | SUBCUTANEOUS | Status: DC
  Administered 2016-01-08: 8 [IU] via SUBCUTANEOUS
  Administered 2016-01-09: 2 [IU] via SUBCUTANEOUS
  Administered 2016-01-09: 8 [IU] via SUBCUTANEOUS
  Administered 2016-01-09: 11 [IU] via SUBCUTANEOUS

## 2016-01-08 MED ORDER — SODIUM CHLORIDE 0.9 % IV SOLN: 250.0000 mL | INTRAVENOUS | Status: DC | PRN

## 2016-01-08 MED ORDER — AZITHROMYCIN 250 MG PO TABS
500.0000 mg | ORAL_TABLET | Freq: Every day | ORAL | Status: AC
Start: 2016-01-08 — End: 2016-01-08
  Administered 2016-01-08: 500 mg via ORAL
  Filled 2016-01-08: qty 2

## 2016-01-08 MED ORDER — INSULIN ASPART 100 UNIT/ML ~~LOC~~ SOLN
0.0000 [IU] | Freq: Three times a day (TID) | SUBCUTANEOUS | Status: DC
  Administered 2016-01-08 (×2): 9 [IU] via SUBCUTANEOUS

## 2016-01-08 MED ORDER — GUAIFENESIN ER 600 MG PO TB12
600.0000 mg | ORAL_TABLET | Freq: Two times a day (BID) | ORAL | Status: DC
  Administered 2016-01-08 – 2016-01-12 (×10): 600 mg via ORAL
  Filled 2016-01-08 (×10): qty 1

## 2016-01-08 MED ORDER — AZITHROMYCIN 250 MG PO TABS
250.0000 mg | ORAL_TABLET | Freq: Every day | ORAL | Status: AC
  Administered 2016-01-09 – 2016-01-12 (×4): 250 mg via ORAL
  Filled 2016-01-08 (×4): qty 1

## 2016-01-08 NOTE — Care Management Note (Signed)
Case Management Note  Patient Details  Name: [name] MRN: [id number] Date of Birth: 14-Apr-1951  Subjective/Objective:                  Admitted with COPD exacerbation. Pt is from home, lives with her husband and has daughter who lives next door. Pt is ind with ADL's. Pt is recent AKA. Pt recently [place] from [facility] nursing services. Pt has wheelchair and is ind with transfers, pt does not yet have prosthesis. Pt feels she may need home O2 at DC and states she needs a new neb machine and has not had a new one in >5 years. Pt state she uses [facility] for both HH and DME needs.   Action/Plan: Will cont to follow for DC planning, pt will need home O2 assessment, neb machine and HH nursing at DC.   Expected Discharge Date:  01/11/16               Expected Discharge Plan:  [place]  In-House Referral:  NA  Discharge planning Services  CM Consult  Post Acute Care Choice:  Durable Medical Equipment, Home Health Choice offered to:  Patient  DME Arranged:  Nebulizer machine DME Agency:  [place] Arranged:  RN [facility] Agency:  [place]  Status of Service:  In process, will continue to follow  Medicare Important Message Given:    Date Medicare IM Given:    Medicare IM give by:    Date Additional Medicare IM Given:    Additional Medicare Important Message give by:     If discussed at [place] of Stay Meetings, dates discussed:    Additional Comments:  Sherald Barge, RN 01/08/2016, 3:29 PM

## 2016-01-08 NOTE — Progress Notes (Signed)
Inpatient Diabetes Program Recommendations  AACE/ADA: New Consensus Statement on Inpatient Glycemic Control (2015)  Target Ranges:  Prepandial:   less than 140 mg/dL      Peak postprandial:   less than 180 mg/dL (1-2 hours)      Critically ill patients:  140 - 180 mg/dL   Review of Glycemic Control Results for [name], [name] (MRN [id number]) as of 01/08/2016 12:22  Ref. Range 11/16/2015 20:36 11/17/2015 07:58 11/17/2015 11:55 01/08/2016 07:19 01/08/2016 11:41  Glucose-Capillary Latest Ref Range: 65-99 mg/dL 341 (H) 285 (H) 398 (H) 404 (H) 366 (H)   Diabetes history: DM Type 2 Outpatient Diabetes medications: Lantus 34 units daily + Novolog correction scale 2-10 units tid with meals + Metformin 500 mg bid Current orders for Inpatient glycemic control: Lantus 26 units q hs (received 12 units this am)  Inpatient Diabetes Program Recommendations:  Noted patient on Solumedrol 80 mg q 12 hrs. Spoke with Nurse [name] to share with physician on rounds. Please consider increase in Novolog Correction Scale to moderate 0-15 units & add hs coverage scale of 0-5.  Patient on home dose of Lantus 34 units daily. Patient has received Lantus 12 units this am and will receive 26 units this hs. For total of 38 units/24 hr period. If MD desires closer to home dose, lessen Lantus dose to 22 units @ hs tonight and start on home dose tomorrow of 34 units q hs. Noted poor intake since admission.  Thank you, [name]. Debra Bielinski, RN, MSN, CDE Inpatient Glycemic Control Team Team Pager [phone] (8am-5pm) 01/08/2016 12:25 PM

## 2016-01-08 NOTE — ED Notes (Signed)
Pt got up out of bed and pivoted to mimic getting up and out of a wheelchair, Pt's O2 saturation dropped to 86% on room air. Pt returned to bed and placed on 3L [place] O2 saturation at 94% at this time. MD Thurnell Garbe notified.

## 2016-01-08 NOTE — Progress Notes (Signed)
Admitted for COPD exacerbation has not been smoking for 6 months currently on IV Solu-Medrol Zithromax as well as anticholinergics and beta agonists nebulizer therapy she likewise has anxiety we'll reinstitute her a Xanax SUSA BARIBAULT [id number] DOB: 17-Sep-1950 DOA: 01/07/2016 PCP: Maricela Curet, MD             Physical Exam: Blood pressure 132/66, pulse 117, temperature 97.7 F (36.5 C), temperature source Oral, resp. rate 18, height 5\' 5"  (1.651 m), weight 187 lb 12.8 oz (85.186 kg), SpO2 93 %. mild to moderate inspiratory next 3 wheezes scattered rhonchi diminished decreased breath sounds to bases no rales appreciable heart regular rhythm no*for measles rubs abdomen soft nontender bowel sounds normoactive   Investigations:  No results found for this or any previous visit (from the past 240 hour(s)).   Basic Metabolic Panel:  Recent Labs  01/07/16 2235 01/08/16 0428  NA 139 140  K 3.9 3.9  CL 105 105  CO2 26 22  GLUCOSE 178* 434*  BUN 12 14  CREATININE 0.93 1.05*  CALCIUM 9.0 9.0   Liver Function Tests: No results for input(s): AST, ALT, ALKPHOS, BILITOT, PROT, ALBUMIN in the last 72 hours.   CBC:  Recent Labs  01/07/16 2235 01/08/16 0428  WBC 8.6 6.5  NEUTROABS 4.2  --   HGB 10.1* 9.5*  HCT 32.6* 31.6*  MCV 87.4 88.5  PLT 275 256    Dg Chest Port 1 View  01/07/2016  CLINICAL DATA:  Shortness of breath and wheezing EXAM: PORTABLE CHEST 1 VIEW COMPARISON:  11/13/2015 FINDINGS: Chronic hyperinflation and emphysematous changes. Chronic cardiomegaly. Interstitial coarsening and hazy bibasilar lung opacity. Possible small effusions. No pneumothorax. IMPRESSION: 1. Interstitial lung opacities, likely CHF. 2. Background emphysema. Electronically Signed   By: Monte Fantasia M.D.   On: 01/07/2016 22:19      Medications  Impression:  Principal Problem:   COPD with acute exacerbation (HCC) Active Problems:   SOB (shortness of breath)   Essential  hypertension   Chronic low back pain   Poorly controlled type 2 diabetes mellitus with peripheral neuropathy (HCC)   CHF (congestive heart failure) ([place])   Acute respiratory failure with hypoxia ([place])     Plan: Medrol 125 IV every 6 hours continue Xopenex as well as Atrovent nebulizer therapy reinstitute Xanax 0.5 twice a day resume Lantus 34 units at bedtime continue sliding scale insulin coverage  Consultants:    Procedures   Antibiotics: Zithromax                  Code Status: Full  Family Communication:    Disposition Plan   Time spent: 30 minutes     Graylee Arutyunyan M   01/08/2016, 12:54 PM

## 2016-01-08 NOTE — H&P (Signed)
PCP:   Maricela Curet, MD   Chief Complaint:  Sob, wheezing  HPI: 65 yp female h/o copd, chf, amputee comes in with several days of worsening sob and wheezing.  No fevers.  No swelling.  Coughing some dry cough.  No n/v/d.  Not on supplemental oxygen at home.  Pt wheezing on arrival and sob.  Improved after nebs and solumedrol.  desats to the mid 80s with movement however.  Pt broke her neb machine today.  Referred for admission for copde.   Review of Systems:  Positive and negative as per HPI otherwise all other systems are negative  Past Medical History: Past Medical History  Diagnosis Date  . Hypertension   . COPD (chronic obstructive pulmonary disease) ([place])   . Stroke ([place])   . Chronic lower back pain   . Shingles Dec. 2016    Scalp  . Peripheral vascular disease ([place])   . GERD (gastroesophageal reflux disease)   . Hyperlipidemia   . Anxiety   . Type II diabetes mellitus ([place])   . Arthritis     "back" (10/30/2015)  . CHF (congestive heart failure) ([place])   . Hypoxia    Past Surgical History  Procedure Laterality Date  . Posterior lumbar fusion    . Foot surgery Bilateral     "from standing on concrete all day"  . Peripheral vascular catheterization N/A 04/10/2015    Procedure: Abdominal Aortogram w/Lower Extremity;  Surgeon: Conrad Riddle, MD;  Location: [facility];  Service: Cardiovascular;  Laterality: N/A;  . Thrombectomy femoral artery Right 04/10/2015    Procedure: THROMBECTOMY RIGHT TIBIAL  ARTERY WITH VEIN PATCH ANGIOPLASTY;  Surgeon: Elam Dutch, MD;  Location: [facility];  Service: Vascular;  Laterality: Right;  . Fasciotomy Right 04/10/2015    Procedure: FOUR COMPARTMENT FASCIOTOMY;  Surgeon: Elam Dutch, MD;  Location: [facility];  Service: Vascular;  Laterality: Right;  . Femoral-popliteal bypass graft Right 04/10/2015    Procedure: RIGHT  FEMORAL-BELOW THE KNEE POPLITEAL ARTERY BYPASS GRAFT USING 6 MM X 80 CM PROPATEN GRAFT;  Surgeon: Elam Dutch, MD;  Location: [place];  Service: Vascular;  Laterality: Right;  . Intraoperative arteriogram Right 04/10/2015    Procedure: INTRA OPERATIVE ARTERIOGRAM;  Surgeon: Elam Dutch, MD;  Location: [facility];  Service: Vascular;  Laterality: Right;  . Carotid endarterectomy Right 2010    Dr Kellie Simmering   . Peripheral vascular catheterization N/A 10/20/2015    Procedure: Abdominal Aortogram;  Surgeon: Elam Dutch, MD;  Location: [facility];  Service: Cardiovascular;  Laterality: N/A;  . Laparoscopic cholecystectomy    . Back surgery    . Carotid endarterectomy Left 03/2008    Archie Endo 01/15/2011  . Shoulder open rotator cuff repair Right 02/2006    Archie Endo 01/29/2011  . Cardiac catheterization    . 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 Amputation Right 10/30/2015    Procedure: RIGHT ABOVE KNEE AMPUTATION;  Surgeon: Elam Dutch, MD;  Location: [facility] OR;  Service: Vascular;  Laterality: Right;    Medications: Prior to Admission medications   Medication Sig Start Date End Date Taking? Authorizing Provider  albuterol (PROVENTIL HFA;VENTOLIN HFA) 108 (90 Base) MCG/ACT inhaler Inhale 2 puffs into the lungs every 6 (six) hours as needed for wheezing or shortness of breath.    Historical Provider, MD  ALPRAZolam Duanne Moron) 0.25 MG tablet Take 0.25 mg by mouth 4 (four) times daily as needed for anxiety.  Historical Provider, MD  aspirin 325 MG tablet Take 1 tablet (325 mg total) by mouth daily. 04/15/15   Thurnell Lose, MD  atorvastatin (LIPITOR) 20 MG tablet Take 1 tablet (20 mg total) by mouth daily at 6 PM. 04/15/15   Thurnell Lose, MD  citalopram (CELEXA) 20 MG tablet Take 20 mg by mouth daily. 07/04/13   Historical Provider, MD  ferrous sulfate 325 (65 FE) MG tablet Take 1 tablet (325 mg total) by mouth daily with breakfast. 11/09/15   Lucia Gaskins, MD  gabapentin (NEURONTIN) 300 MG capsule Take 300 mg by mouth 3 (three) times daily.    Historical Provider, MD  insulin aspart  (NOVOLOG) 100 UNIT/ML injection Inject 2-10 Units into the skin 3 (three) times daily before meals. 151-200= 2 units 201-250= 4 units 251-300= 6 units 301-350= 8 units 351-400=10units If greater than 400-Call NP/Dr    Historical Provider, MD  Insulin Glargine (LANTUS SOLOSTAR) 100 UNIT/ML Solostar Pen Inject 34 Units into the skin at bedtime.    Historical Provider, MD  ipratropium-albuterol (DUONEB) 0.5-2.5 (3) MG/3ML SOLN Take 3 mLs by nebulization every 8 (eight) hours.     Historical Provider, MD  lisinopril (PRINIVIL,ZESTRIL) 20 MG tablet Take 1 tablet (20 mg total) by mouth daily. 11/04/15   Alvia Grove, PA-C  metFORMIN (GLUCOPHAGE) 500 MG tablet Take 500 mg by mouth 2 (two) times daily with a meal.    Historical Provider, MD  metoprolol tartrate (LOPRESSOR) 25 MG tablet Take 25 mg by mouth 2 (two) times daily.     Historical Provider, MD  naproxen (NAPROSYN) 500 MG tablet Take 500 mg by mouth 2 (two) times daily as needed for mild pain or moderate pain.  03/16/15   Historical Provider, MD  oxyCODONE 10 MG TABS Take 1 tablet (10 mg total) by mouth every 4 (four) hours as needed for moderate pain. 11/09/15   Lucia Gaskins, MD  pantoprazole (PROTONIX) 40 MG tablet Take 40 mg by mouth daily.    Historical Provider, MD  predniSONE (DELTASONE) 20 MG tablet Take 1 tablet (20 mg total) by mouth 2 (two) times daily with a meal. 11/09/15   Lucia Gaskins, MD  varenicline (CHANTIX) 0.5 MG tablet Take 2 tablets (1 mg total) by mouth 2 (two) times daily. Patient not taking: Reported on 11/23/2015 08/23/15   Lucia Gaskins, MD    Allergies:   Allergies  Allergen Reactions  . Codeine Shortness Of Breath  . Penicillins Other (See Comments)    Has patient had a PCN reaction causing immediate rash, facial/tongue/throat swelling, SOB or lightheadedness with hypotension: unknown Has patient had a PCN reaction causing severe rash involving mucus membranes or skin necrosis: unknown Has patient had a  PCN reaction that required hospitalization unknown Has patient had a PCN reaction occurring within the last 10 years: unknown If all of the above answers are "NO", then may proceed with Cephalosporin use.  . Ciprofloxacin Hives    Social History:  reports that she has quit smoking. Her smoking use included Cigarettes. She has a 5.88 pack-year smoking history. She has never used smokeless tobacco. She reports that she does not drink alcohol or use illicit drugs.  Family History: Family History  Problem Relation Age of Onset  . Cancer Father   . Heart attack Sister   . Stroke Sister   . Hypertension Sister   . Hypertension Brother     Physical Exam: Filed Vitals:   01/07/16 2230 01/07/16 2300 01/07/16 2330 01/08/16  0001  BP: 142/85 145/85 125/70 179/85  Pulse: 123 125 117 133  Temp:      TempSrc:      Resp: 20 17 19 19   Height:      Weight:      SpO2: 100% 100% 100% 96%   General appearance: alert, cooperative and no distress Head: Normocephalic, without obvious abnormality, atraumatic Eyes: negative Nose: Nares normal. Septum midline. Mucosa normal. No drainage or sinus tenderness. Neck: no JVD and supple, symmetrical, trachea midline Lungs: diminished breath sounds bibasilar and wheezes bibasilar Heart: regular rate and rhythm, S1, S2 normal, no murmur, click, rub or gallop Abdomen: soft, non-tender; bowel sounds normal; no masses,  no organomegaly Extremities: extremities normal, atraumatic, no cyanosis or edema  Left amputee Pulses: 2+ and symmetric Skin: Skin color, texture, turgor normal. No rashes or lesions Neurologic: Grossly normal    Labs on Admission:   Recent Labs  01/07/16 2235  NA 139  K 3.9  CL 105  CO2 26  GLUCOSE 178*  BUN 12  CREATININE 0.93  CALCIUM 9.0    Recent Labs  01/07/16 2235  WBC 8.6  NEUTROABS 4.2  HGB 10.1*  HCT 32.6*  MCV 87.4  PLT 275    Recent Labs  01/07/16 2235  TROPONINI <0.03   Radiological Exams on  Admission: Dg Chest Port 1 View  01/07/2016  CLINICAL DATA:  Shortness of breath and wheezing EXAM: PORTABLE CHEST 1 VIEW COMPARISON:  11/13/2015 FINDINGS: Chronic hyperinflation and emphysematous changes. Chronic cardiomegaly. Interstitial coarsening and hazy bibasilar lung opacity. Possible small effusions. No pneumothorax. IMPRESSION: 1. Interstitial lung opacities, likely CHF. 2. Background emphysema. Electronically Signed   By: Monte Fantasia M.D.   On: 01/07/2016 22:19    Assessment/Plan  65 yo female with acute copde with mild hypoxic respiratory failure  Principal Problem:   COPD with acute exacerbation ([place])- zpack.  Iv solumedrol.   Nebs (xoponex due to tachycardia), oxygen prn  Active Problems:   Acute respiratory failure with hypoxia ([place])- due to copde, o2 prn , wean as tolerates   SOB (shortness of breath)- due to above   Essential hypertension   Chronic low back pain   Poorly controlled type 2 diabetes mellitus with peripheral neuropathy ([place])-  ssi   CHF (congestive heart failure) ([place])- noted, euvolemic  obs on medical.  Full code.  pcp dr Cindie Laroche.  Kamauri Denardo A 01/08/2016, 12:26 AM

## 2016-01-09 LAB — GLUCOSE, CAPILLARY
GLUCOSE-CAPILLARY: 350 mg/dL — AB (ref 65–99)
GLUCOSE-CAPILLARY: 150 mg/dL — AB (ref 65–99)
GLUCOSE-CAPILLARY: 185 mg/dL — AB (ref 65–99)

## 2016-01-09 MED ORDER — INSULIN ASPART 100 UNIT/ML ~~LOC~~ SOLN
0.0000 [IU] | Freq: Three times a day (TID) | SUBCUTANEOUS | Status: DC
  Administered 2016-01-09: 2 [IU] via SUBCUTANEOUS
  Administered 2016-01-10 (×3): 15 [IU] via SUBCUTANEOUS
  Administered 2016-01-11: 11 [IU] via SUBCUTANEOUS
  Administered 2016-01-11 (×2): 4 [IU] via SUBCUTANEOUS
  Administered 2016-01-12: 3 [IU] via SUBCUTANEOUS

## 2016-01-09 MED ORDER — LEVALBUTEROL HCL 0.63 MG/3ML IN NEBU
0.6300 mg | INHALATION_SOLUTION | Freq: Three times a day (TID) | RESPIRATORY_TRACT | Status: DC
  Administered 2016-01-10 – 2016-01-12 (×7): 0.63 mg via RESPIRATORY_TRACT
  Filled 2016-01-09 (×7): qty 3

## 2016-01-09 MED ORDER — INSULIN ASPART 100 UNIT/ML ~~LOC~~ SOLN
0.0000 [IU] | Freq: Every day | SUBCUTANEOUS | Status: DC
  Administered 2016-01-10: 5 [IU] via SUBCUTANEOUS
  Administered 2016-01-11: 4 [IU] via SUBCUTANEOUS

## 2016-01-09 MED ORDER — IPRATROPIUM BROMIDE 0.02 % IN SOLN
0.5000 mg | Freq: Three times a day (TID) | RESPIRATORY_TRACT | Status: DC
  Administered 2016-01-10 – 2016-01-12 (×7): 0.5 mg via RESPIRATORY_TRACT
  Filled 2016-01-09 (×7): qty 2.5

## 2016-01-09 NOTE — Progress Notes (Signed)
Inpatient Diabetes Program Recommendations  AACE/ADA: New Consensus Statement on Inpatient Glycemic Control (2015)  Target Ranges:  Prepandial:   less than 140 mg/dL      Peak postprandial:   less than 180 mg/dL (1-2 hours)      Critically ill patients:  140 - 180 mg/dL   Review of Glycemic Control  Diabetes history: DM Type 2 Outpatient Diabetes medications: Lantus 34 units daily + Novolog correction scale 2-10 units tid with meals + Metformin 500 mg bid Current orders for Inpatient glycemic control: Lantus 34 units q hs + Novolog correction 0-15 + hs correction 0-5  Inpatient Diabetes Program Recommendations:  Noted elevated CBGs with above correction. Please consider adding meal coverage 3-5 units tid with meals (hold if eats < 50%). If steroids remain same dose, please consider increase in basal of Lantus to 46 units (patient received 12 + 34 units of Lantus yesterday).  Thank you, [name]. Debra Lisbon, RN, MSN, CDE Inpatient Glycemic Control Team Team Pager [phone] (8am-5pm) 01/09/2016 9:42 AM

## 2016-01-09 NOTE — Progress Notes (Signed)
Bronchospasm moderately improved on high-dose steroids over yesterday glucose is significantly elevated due to steroids currently on moderate sliding scale NovoLog with at bedtime coverage [name] [id number] DOB: [birth year] DOA: 01/07/2016 PCP: Maricela Curet, MD             Physical Exam: Blood pressure 120/63, pulse 121, temperature 97.8 F (36.6 C), temperature source Oral, resp. rate 18, height 5\' 5"  (1.651 m), weight 188 lb 9.6 oz (85.548 kg), SpO2 98 %. Lungs show minimal to moderate intrauterine respiratory wheezes scattered rhonchi decreased breath sounds in the bases heart regular rhythm no S3-S4 no heaves thrills rubs abdomen soft nontender bowel sounds normoactive   Investigations:  No results found for this or any previous visit (from the past 240 hour(s)).   Basic Metabolic Panel:  Recent Labs  01/07/16 2235 01/08/16 0428  NA 139 140  K 3.9 3.9  CL 105 105  CO2 26 22  GLUCOSE 178* 434*  BUN 12 14  CREATININE 0.93 1.05*  CALCIUM 9.0 9.0   Liver Function Tests: No results for input(s): AST, ALT, ALKPHOS, BILITOT, PROT, ALBUMIN in the last 72 hours.   CBC:  Recent Labs  01/07/16 2235 01/08/16 0428  WBC 8.6 6.5  NEUTROABS 4.2  --   HGB 10.1* 9.5*  HCT 32.6* 31.6*  MCV 87.4 88.5  PLT 275 256    Dg Chest Port 1 View  01/07/2016  CLINICAL DATA:  Shortness of breath and wheezing EXAM: PORTABLE CHEST 1 VIEW COMPARISON:  11/13/2015 FINDINGS: Chronic hyperinflation and emphysematous changes. Chronic cardiomegaly. Interstitial coarsening and hazy bibasilar lung opacity. Possible small effusions. No pneumothorax. IMPRESSION: 1. Interstitial lung opacities, likely CHF. 2. Background emphysema. Electronically Signed   By: Monte Fantasia M.D.   On: 01/07/2016 22:19      Medications:   Impression: Principal Problem:   COPD with acute exacerbation (HCC) Active Problems:   SOB (shortness of breath)   Essential hypertension   Chronic low back  pain   Poorly controlled type 2 diabetes mellitus with peripheral neuropathy (HCC)   CHF (congestive heart failure) (HCC)   Acute respiratory failure with hypoxia (HCC)     Plan:Continue Solu-Medrol 125 IV every 6 hours continue sliding scale and Lantus 34 units at bedtime expect improvement decrease both steroids and since insulin coverage is indicated  Consultants: Pulmonary not available  Procedures   Antibiotics:                   Code Status:   Family Communication:    Disposition Plan   Time spent: 30 minutes   LOS: 1 day   [name] M   01/09/2016, 2:25 PM

## 2016-01-09 NOTE — Clinical Documentation Improvement (Signed)
Dr. Cindie Laroche and[facility]  Please document query responses in the progress notes and discharge summary, not on the CDI BPA form.  Thank you.  "CHF" is documented in the H&P and subsequent progress notes this admission.  If know or able to determine, please document the Acuity and Type of CHF:  - Acuity - Acute;  Chronic;  Acute on Chronic  - Type - Systolic, Diastolic, Combined  - Other acuity and type  - Unable to clinically determine  Clinical Information: CXR 01/07/16 - Interstitial lung opacities. Likely CHF.  Background emphysema Home medications include - Lisinopril;  Lopressor No IV or PO diuretics this admission or at home prior to admission  Please exercise your independent, professional judgment when responding. A specific answer is not anticipated or expected.   Thank You, Erling Conte  RN BSN CCDS [phone] Health Information Management [place]

## 2016-01-10 LAB — GLUCOSE, CAPILLARY
GLUCOSE-CAPILLARY: 323 mg/dL — AB (ref 65–99)
GLUCOSE-CAPILLARY: 310 mg/dL — AB (ref 65–99)
Glucose-Capillary: 322 mg/dL — ABNORMAL HIGH (ref 65–99)
Glucose-Capillary: 480 mg/dL — ABNORMAL HIGH (ref 65–99)

## 2016-01-10 NOTE — Progress Notes (Signed)
Glycemic control somewhat improved on lower dose redness around 80 mg every 12 hours patient receiving 34 units of Lantus at bedtime with aggressive sliding scale at bedtime and before meals coverage [name] [id number] DOB: 20-Nov-1950 DOA: 01/07/2016 PCP: Maricela Curet, MD             Physical Exam: Blood pressure 128/84, pulse 89, temperature 97.6 F (36.4 C), temperature source Oral, resp. rate 18, height 5\' 5"  (1.651 m), weight 188 lb 4.8 oz (85.412 kg), SpO2 99 %. Lungs show mild end expiratory wheeze scattered rhonchi no rales appreciable heart regular rhythm no murmurs goes heaves thrills rubs abdomen soft nontender bowel sounds normoactive   Investigations:  No results found for this or any previous visit (from the past 240 hour(s)).   Basic Metabolic Panel:  Recent Labs  01/07/16 2235 01/08/16 0428  NA 139 140  K 3.9 3.9  CL 105 105  CO2 26 22  GLUCOSE 178* 434*  BUN 12 14  CREATININE 0.93 1.05*  CALCIUM 9.0 9.0   Liver Function Tests: No results for input(s): AST, ALT, ALKPHOS, BILITOT, PROT, ALBUMIN in the last 72 hours.   CBC:  Recent Labs  01/07/16 2235 01/08/16 0428  WBC 8.6 6.5  NEUTROABS 4.2  --   HGB 10.1* 9.5*  HCT 32.6* 31.6*  MCV 87.4 88.5  PLT 275 256    No results found.    Medications:   Impression:  Principal Problem:   COPD with acute exacerbation ([place]) Active Problems:   SOB (shortness of breath)   Essential hypertension   Chronic low back pain   Poorly controlled type 2 diabetes mellitus with peripheral neuropathy (HCC)   CHF (congestive heart failure) (HCC)   Acute respiratory failure with hypoxia (HCC)     Plan: Continue Solu-Medrol 80 mg IV every 12 continue insulin as currently prescribed monitor glycemic control will consider oral steroids in 24-48 hours and discharge  Consultants: Pulmonary not available   Procedures   Antibiotics:                   Code Status:   Family  Communication:    Disposition Plan   Time spent: 30 minutes   LOS: 2 days   [name]   01/10/2016, 12:55 PM

## 2016-01-10 NOTE — Progress Notes (Signed)
Inpatient Diabetes Program Recommendations  AACE/ADA: New Consensus Statement on Inpatient Glycemic Control (2015)  Target Ranges:  Prepandial:   less than 140 mg/dL      Peak postprandial:   less than 180 mg/dL (1-2 hours)      Critically ill patients:  140 - 180 mg/dL   Review of Glycemic Control Results for [name], [name] (MRN [id number]) as of 01/10/2016 09:05  Ref. Range 01/08/2016 20:19 01/09/2016 11:30 01/09/2016 17:04 01/09/2016 20:13 01/10/2016 07:25  Glucose-Capillary Latest Ref Range: 65-99 mg/dL 370 (H) 350 (H) 150 (H) 185 (H) 323 (H)   Diabetes history: DM Type 2 Outpatient Diabetes medications: Lantus 34 units daily + Novolog correction scale 2-10 units tid with meals + Metformin 500 mg bid Current orders for Inpatient glycemic control: Lantus 34 units q hs + Novolog moderate correction tid 0-20 + hs correction 0-5  Inpatient Diabetes Program Recommendations:  Please note am CBG 323 this am. Please consider adding meal coverage 3-5 units tid with meals (hold if eats < 50%). If steroids remain same dose, please consider increase in basal of Lantus to 46 units.  Thank you, Nani Gasser. Kjersten Ormiston, RN, MSN, CDE Inpatient Glycemic Control Team Team Pager [phone] (8am-5pm) 01/10/2016 9:09 AM

## 2016-01-11 LAB — GLUCOSE, CAPILLARY
GLUCOSE-CAPILLARY: 265 mg/dL — AB (ref 65–99)
GLUCOSE-CAPILLARY: 170 mg/dL — AB (ref 65–99)
Glucose-Capillary: 192 mg/dL — ABNORMAL HIGH (ref 65–99)
Glucose-Capillary: 348 mg/dL — ABNORMAL HIGH (ref 65–99)

## 2016-01-11 MED ORDER — INSULIN ASPART 100 UNIT/ML ~~LOC~~ SOLN
8.0000 [IU] | Freq: Three times a day (TID) | SUBCUTANEOUS | Status: DC
  Administered 2016-01-11 – 2016-01-12 (×4): 8 [IU] via SUBCUTANEOUS

## 2016-01-11 MED ORDER — PREDNISONE 20 MG PO TABS
50.0000 mg | ORAL_TABLET | Freq: Every day | ORAL | Status: DC
  Administered 2016-01-12: 50 mg via ORAL
  Filled 2016-01-11: qty 2

## 2016-01-11 NOTE — Progress Notes (Signed)
Respiratory status improving might just mild end expiratory wheezes noted now scattered rhonchi no rales we'll switch to by mouth prednisone consider discharge within 24 hours if bronchospasm remains stable ARIBELLE RACE [id number] DOB: Apr 16, 1951 DOA: 01/07/2016 PCP: Maricela Curet, MD             Physical Exam: Blood pressure 125/77, pulse 72, temperature 98.5 F (36.9 C), temperature source Oral, resp. rate 20, height 5\' 5"  (1.651 m), weight 183 lb 6.8 oz (83.2 kg), SpO2 97 %. Lungs show diminished breath sounds in bases scattered rhonchi mild end expiratory wheeze no rales appreciable heart regular rhythm no S3-S4 no heaves thrills rubs   Investigations:  No results found for this or any previous visit (from the past 240 hour(s)).   Basic Metabolic Panel: No results for input(s): NA, K, CL, CO2, GLUCOSE, BUN, CREATININE, CALCIUM, MG, PHOS in the last 72 hours. Liver Function Tests: No results for input(s): AST, ALT, ALKPHOS, BILITOT, PROT, ALBUMIN in the last 72 hours.   CBC: No results for input(s): WBC, NEUTROABS, HGB, HCT, MCV, PLT in the last 72 hours.  No results found.    Medications:   Impression:  Principal Problem:   COPD with acute exacerbation ([place]) Active Problems:   SOB (shortness of breath)   Essential hypertension   Chronic low back pain   Poorly controlled type 2 diabetes mellitus with peripheral neuropathy (HCC)   CHF (congestive heart failure) (HCC)   Acute respiratory failure with hypoxia (HCC)     Plan: DC Solu-Medrol and prednisone 40 mg by mouth daily monitor glycemic control and bronchospastic response  Consultants: Pulmonary not available   Procedures   Antibiotics: Zithromax                  Code Status: Full  Family Communication:  Spoke with patient  Disposition Plan   Time spent: 30 minutes   LOS: 3 days   Mahima Hottle M   01/11/2016, 12:57 PM

## 2016-01-11 NOTE — Progress Notes (Signed)
Inpatient Diabetes Program Recommendations  AACE/ADA: New Consensus Statement on Inpatient Glycemic Control (2015)  Target Ranges:  Prepandial:   less than 140 mg/dL      Peak postprandial:   less than 180 mg/dL (1-2 hours)      Critically ill patients:  140 - 180 mg/dL   Review of Glycemic Control Results for [name], [name] (MRN [id number]) as of 01/11/2016 08:03  Ref. Range 01/10/2016 07:25 01/10/2016 11:08 01/10/2016 16:16 01/10/2016 20:46 01/11/2016 07:09  Glucose-Capillary Latest Ref Range: 65-99 mg/dL 323 (H) 310 (H) 322 (H) 480 (H) 192 (H)   Diabetes history: DM Type 2 Outpatient Diabetes medications: Lantus 34 units daily + Novolog correction scale 2-10 units tid with meals + Metformin 500 mg bid Current orders for Inpatient glycemic control: Lantus 34 units q hs + Novolog moderate correction tid 0-20 + hs correction 0-5  Inpatient Diabetes Program Recommendations:    Spoke with Dr. Cindie Laroche regarding elevated CBGs. Received verbal order for Novolog 8 units meal coverage tid and do not give if patient eats less than 50 % of meal. Spoke with RN Megan and updated on addition of meal coverage.  Thank you, Nani Gasser. Carlas Vandyne, RN, MSN, CDE Inpatient Glycemic Control Team Team Pager [phone] (8am-5pm) 01/11/2016 8:05 AM

## 2016-01-12 LAB — GLUCOSE, CAPILLARY
GLUCOSE-CAPILLARY: 138 mg/dL — AB (ref 65–99)
GLUCOSE-CAPILLARY: 115 mg/dL — AB (ref 65–99)

## 2016-01-12 MED ORDER — PREDNISONE 20 MG PO TABS
20.0000 mg | ORAL_TABLET | Freq: Every day | ORAL | Status: DC
Start: 2016-01-12 — End: 2016-03-09

## 2016-01-12 NOTE — Care Management Note (Signed)
Case Management Note  Patient Details  Name: KRISETTE CLOUTIER MRN: [id number] Date of Birth: 02/14/1951   Expected Discharge Date:  01/11/16               Expected Discharge Plan:  [place]  In-House Referral:  NA  Discharge planning Services  CM Consult  Post Acute Care Choice:  Durable Medical Equipment, Home Health Choice offered to:  Patient  DME Arranged:  Nebulizer machine DME Agency:  [facility] Arranged:  RN [facility] Agency:  [place]  Status of Service:  Completed, signed off  Medicare Important Message Given:  Yes Date Medicare IM Given:    Medicare IM give by:    Date Additional Medicare IM Given:    Additional Medicare Important Message give by:     If discussed at [place] of Stay Meetings, dates discussed:    Additional Comments: Pt discharging home today with [facility] services through [facility]. Romualdo Bolk, of [facility], made aware of DC today. Neb machine already ordered and will be delivered to pt's room prior to DC. Pt does not meet requirements for home O2. Pt understands HH has 48 hours to initiate services. Per pt's caregiver Shaquina Stepien) Pt is on the list for CAP aid and family given list of PD agencies for them to contact to discuss needs/prices for PD aid services until CAP is approved. Sherald Barge, RN 01/12/2016, 12:40 PM

## 2016-01-12 NOTE — Progress Notes (Signed)
Received verbal order from Dr. Cindie Laroche to give pain med before discharge.  Pt's vitals are stable, d/c instructions and precriptions reviewed, IV catheter removed and site WNL.  Respiratory delivered and instructed pt on nebulizer.  She has no questions at this time and is discharged home w/husband.

## 2016-01-12 NOTE — Discharge Summary (Signed)
Physician Discharge Summary  [name] [id number] DOB: [birth year] DOA: 01/07/2016  PCP: Maricela Curet, MD  Admit date: 01/07/2016 Discharge date: 01/12/2016   Recommendations for Outpatient Follow-up: Patient will follow my office in one week's time and is urged to take prednisone 20 mg daily by mouth daily for 14 days she is urged to take all her previous prehospitalization added insulin dosages and management of administration for glycemic control likewise with hypertension and hyperlipidemic medicines she is also urged to take her nebulizer in the form of DuoNeb 4 times a day as an outpatient she has a new nebulizer machine to go home with her the previous one was broken Discharge Diagnoses:  Principal Problem:   COPD with acute exacerbation ([place]) Active Problems:   SOB (shortness of breath)   Essential hypertension   Chronic low back pain   Poorly controlled type 2 diabetes mellitus with peripheral neuropathy (HCC)   CHF (congestive heart failure) ([place])   Acute respiratory failure with hypoxia [facility])   Discharge Condition: Good and improving  Filed Weights   01/10/16 0500 01/11/16 0638 01/12/16 0626  Weight: 188 lb 4.8 oz (85.412 kg) 183 lb 6.8 oz (83.2 kg) 185 lb 10 oz (84.2 kg)    History of present illness:  The patient is a 65 year old white female status post AKA 2 months ago with history of hypertension hyperlipidemia peripheral arterial disease secondary to insulin minute diabetes and poor glycemic control is admitted with acute exacerbation of asthmatic bronchitis with severe bronchospasm some hypoxic respiratory insufficiency she was given nasal O2 aggressive nebulizer therapy form of DuoNeb and IV Solu-Medrol 125 IV every 6 hours her Solu-Medrol was weaned down her period of 6-7 days subsequently day prior to discharge was switched to prednisone 50 mg per day she had no increase resumption and bronchospasm for was felt to discharge her safely on oral  steroids  Hospital Course:  See history of present illness above Procedures:    Consultations:  Pulmonary was not available  Discharge Instructions  Discharge Instructions    Discharge instructions    Complete by:  As directed      Discharge patient    Complete by:  As directed             Medication List    TAKE these medications        albuterol 108 (90 Base) MCG/ACT inhaler  Commonly known as:  PROVENTIL HFA;VENTOLIN HFA  Inhale 2 puffs into the lungs every 6 (six) hours as needed for wheezing or shortness of breath.     ALPRAZolam 0.25 MG tablet  Commonly known as:  XANAX  Take 0.25 mg by mouth 4 (four) times daily as needed for anxiety.     aspirin 325 MG tablet  Take 1 tablet (325 mg total) by mouth daily.     atorvastatin 20 MG tablet  Commonly known as:  LIPITOR  Take 1 tablet (20 mg total) by mouth daily at 6 PM.     citalopram 20 MG tablet  Commonly known as:  CELEXA  Take 20 mg by mouth daily.     ferrous sulfate 325 (65 FE) MG tablet  Take 1 tablet (325 mg total) by mouth daily with breakfast.     gabapentin 300 MG capsule  Commonly known as:  NEURONTIN  Take 300 mg by mouth 3 (three) times daily.     insulin aspart 100 UNIT/ML injection  Commonly known as:  novoLOG  Inject 2-10 Units into  the skin 3 (three) times daily before meals. 151-200= 2 units 201-250= 4 units 251-300= 6 units 301-350= 8 units 351-400=10units If greater than 400-Call NP/Dr     ipratropium-albuterol 0.5-2.5 (3) MG/3ML Soln  Commonly known as:  DUONEB  Take 3 mLs by nebulization every 8 (eight) hours.     LANTUS SOLOSTAR 100 UNIT/ML Solostar Pen  Generic drug:  Insulin Glargine  Inject 30 Units into the skin at bedtime.     lisinopril 20 MG tablet  Commonly known as:  PRINIVIL,ZESTRIL  Take 1 tablet (20 mg total) by mouth daily.     metFORMIN 500 MG tablet  Commonly known as:  GLUCOPHAGE  Take 500 mg by mouth 2 (two) times daily with a meal.     metoprolol  tartrate 25 MG tablet  Commonly known as:  LOPRESSOR  Take 25 mg by mouth 2 (two) times daily.     naproxen 500 MG tablet  Commonly known as:  NAPROSYN  Take 500 mg by mouth 2 (two) times daily as needed for mild pain or moderate pain.     Oxycodone HCl 10 MG Tabs  Take 1 tablet (10 mg total) by mouth every 4 (four) hours as needed for moderate pain.     pantoprazole 40 MG tablet  Commonly known as:  PROTONIX  Take 40 mg by mouth daily.     predniSONE 20 MG tablet  Commonly known as:  DELTASONE  Take 1 tablet (20 mg total) by mouth daily with breakfast.       Allergies  Allergen Reactions  . Codeine Shortness Of Breath  . Penicillins Other (See Comments)    Has patient had a PCN reaction causing immediate rash, facial/tongue/throat swelling, SOB or lightheadedness with hypotension: unknown Has patient had a PCN reaction causing severe rash involving mucus membranes or skin necrosis: unknown Has patient had a PCN reaction that required hospitalization unknown Has patient had a PCN reaction occurring within the last 10 years: unknown If all of the above answers are "NO", then may proceed with Cephalosporin use.  . Ciprofloxacin Hives       Follow-up Information    Follow up with [facility].   Contact information:   [address] [place] [address] [phone]        The results of significant diagnostics from this hospitalization (including imaging, microbiology, ancillary and laboratory) are listed below for reference.    Significant Diagnostic Studies: Dg Chest Port 1 View  01/07/2016  CLINICAL DATA:  Shortness of breath and wheezing EXAM: PORTABLE CHEST 1 VIEW COMPARISON:  11/13/2015 FINDINGS: Chronic hyperinflation and emphysematous changes. Chronic cardiomegaly. Interstitial coarsening and hazy bibasilar lung opacity. Possible small effusions. No pneumothorax. IMPRESSION: 1. Interstitial lung opacities, likely CHF. 2. Background  emphysema. Electronically Signed   By: Monte Fantasia M.D.   On: 01/07/2016 22:19    Microbiology: No results found for this or any previous visit (from the past 240 hour(s)).   Labs: Basic Metabolic Panel:  Recent Labs Lab 01/07/16 2235 01/08/16 0428  NA 139 140  K 3.9 3.9  CL 105 105  CO2 26 22  GLUCOSE 178* 434*  BUN 12 14  CREATININE 0.93 1.05*  CALCIUM 9.0 9.0   Liver Function Tests: No results for input(s): AST, ALT, ALKPHOS, BILITOT, PROT, ALBUMIN in the last 168 hours. No results for input(s): LIPASE, AMYLASE in the last 168 hours. No results for input(s): AMMONIA in the last 168 hours. CBC:  Recent Labs  Lab 01/07/16 2235 01/08/16 0428  WBC 8.6 6.5  NEUTROABS 4.2  --   HGB 10.1* 9.5*  HCT 32.6* 31.6*  MCV 87.4 88.5  PLT 275 256   Cardiac Enzymes:  Recent Labs Lab 01/07/16 2235  TROPONINI <0.03   BNP: BNP (last 3 results)  Recent Labs  08/17/15 2145 11/03/15 1851 01/07/16 2235  BNP 961.0* 838.0* 527.0*    ProBNP (last 3 results) No results for input(s): PROBNP in the last 8760 hours.  CBG:  Recent Labs Lab 01/11/16 1057 01/11/16 1633 01/11/16 2104 01/12/16 0729 01/12/16 1123  GLUCAP 265* 170* 348* 138* 115*       Signed:  Leyla Soliz M  [facility] Pager: [phone] 01/12/2016, 12:44 PM

## 2016-01-12 NOTE — Care Management Important Message (Signed)
Important Message  Patient Details  Name: [name] MRN: [id number] Date of Birth: 1951/09/07   Medicare Important Message Given:  Yes    Sherald Barge, RN 01/12/2016, 12:39 PM

## 2016-02-27 ENCOUNTER — Ambulatory Visit (HOSPITAL_COMMUNITY): Payer: Medicare Other | Attending: Vascular Surgery | Admitting: Physical Therapy

## 2016-02-27 DIAGNOSIS — R2681 Unsteadiness on feet: Secondary | ICD-10-CM | POA: Diagnosis present

## 2016-02-27 DIAGNOSIS — R262 Difficulty in walking, not elsewhere classified: Secondary | ICD-10-CM | POA: Insufficient documentation

## 2016-02-27 DIAGNOSIS — Z9181 History of falling: Secondary | ICD-10-CM | POA: Diagnosis present

## 2016-02-27 DIAGNOSIS — M6281 Muscle weakness (generalized): Secondary | ICD-10-CM

## 2016-02-27 NOTE — Therapy (Signed)
[facility] [place], [place], [address] Phone: [phone]   Fax:  [phone]  Physical Therapy Evaluation  Patient Details  Name: [name] MRN: [id number] Date of Birth: 02-20-1951 Referring Provider: Dr. Ruta Hinds   Encounter Date: 02/27/2016      PT End of Session - 02/27/16 1221    Visit Number 1   Number of Visits 25   Date for PT Re-Evaluation 03/28/16   Authorization Type [place] - Visit Number 1   Authorization - Number of Visits 10   PT Start Time 0912   PT Stop Time 0953   PT Time Calculation (min) 41 min   Activity Tolerance Patient tolerated treatment well      Past Medical History  Diagnosis Date  . Hypertension   . COPD (chronic obstructive pulmonary disease) ([place])   . Stroke ([place])   . Chronic lower back pain   . Shingles Dec. 2016    Scalp  . Peripheral vascular disease ([place])   . GERD (gastroesophageal reflux disease)   . Hyperlipidemia   . Anxiety   . Type II diabetes mellitus ([place])   . Arthritis     "back" (10/30/2015)  . CHF (congestive heart failure) ([place])   . Hypoxia     Past Surgical History  Procedure Laterality Date  . Posterior lumbar fusion    . Foot surgery Bilateral     "from standing on concrete all day"  . Peripheral vascular catheterization N/A 04/10/2015    Procedure: Abdominal Aortogram w/Lower Extremity;  Surgeon: Conrad [place], MD;  Location: [facility];  Service: Cardiovascular;  Laterality: N/A;  . Thrombectomy femoral artery Right 04/10/2015    Procedure: THROMBECTOMY RIGHT TIBIAL  ARTERY WITH VEIN PATCH ANGIOPLASTY;  Surgeon: Elam Dutch, MD;  Location: [facility];  Service: Vascular;  Laterality: Right;  . Fasciotomy Right 04/10/2015    Procedure: FOUR COMPARTMENT FASCIOTOMY;  Surgeon: Elam Dutch, MD;  Location: [facility];  Service: Vascular;  Laterality: Right;  . Femoral-popliteal bypass graft Right 04/10/2015    Procedure:  RIGHT  FEMORAL-BELOW THE KNEE POPLITEAL ARTERY BYPASS GRAFT USING 6 MM X 80 CM PROPATEN GRAFT;  Surgeon: Elam Dutch, MD;  Location: [facility];  Service: Vascular;  Laterality: Right;  . Intraoperative arteriogram Right 04/10/2015    Procedure: INTRA OPERATIVE ARTERIOGRAM;  Surgeon: Elam Dutch, MD;  Location: [facility];  Service: Vascular;  Laterality: Right;  . Carotid endarterectomy Right 2010    Dr Kellie Simmering   . Peripheral vascular catheterization N/A 10/20/2015    Procedure: Abdominal Aortogram;  Surgeon: Elam Dutch, MD;  Location: [facility];  Service: Cardiovascular;  Laterality: N/A;  . Laparoscopic cholecystectomy    . Back surgery    . Carotid endarterectomy Left 03/2008    Archie Endo 01/15/2011  . Shoulder open rotator cuff repair Right 02/2006    Archie Endo 01/29/2011  . Cardiac catheterization    . 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 Amputation Right 10/30/2015    Procedure: RIGHT ABOVE KNEE AMPUTATION;  Surgeon: Elam Dutch, MD;  Location: [facility];  Service: Vascular;  Laterality: Right;    There were no vitals filed for this visit.       Subjective Assessment - 02/27/16 0910    Subjective Ms. Caufield states that she recieved her prothesis on two weeks ago.  She has put her prothesis on a few times but  not like she should.  She is here to work on becoming independent putting her prothesis on and off and being able to ambullate  with her prothesis      Pertinent History Ms. Fritchie had a AKA on 10/30/2015 was discharged to SNF on 11/03/2015 but returned to the hospital the same day with SOB.  She remained in the hospitao until 11/09/2015 when again she was discharged to SNF; she again returned to the hospital on 2/27 with sepsis and stayed until 11/17/2015.    She was readmitted int o the hospital with exacerbated COPD on 01/06/2106 she went home with her husband.  Hx includes past CVA, COPD , PVD , LBP , CHF and DM    How long can you sit comfortably? no problem    How long  can you stand comfortably? unsure    How long can you walk comfortably? unsure    Currently in Pain? No/denies            [facility] PT Assessment - 02/27/16 0001    Assessment   Medical Diagnosis Rt AKA   Referring Provider Dr. Ruta Hinds    Onset Date/Surgical Date 10/30/15   Hand Dominance Right   Prior Therapy [facility] but none since she has recieved her prothesisi    Precautions   Precautions Fall   Restrictions   Weight Bearing Restrictions No   Balance Screen   Has the patient fallen in the past 6 months Yes   How many times? 2   Has the patient had a decrease in activity level because of a fear of falling?  Yes   Is the patient reluctant to leave their home because of a fear of falling?  Yes   [place] residence   Prior Function   Level of Independence Needs assistance with ADLs;Needs assistance with transfers   Vocation Retired   Leisure would like to get back to yard sales and going to get coffee in the morning    Cognition   Overall Cognitive Status Within Functional Limits for tasks assessed   Observation/Other Assessments   Focus on Therapeutic Outcomes (FOTO)  Pt ability to ambulate   Observation/Other Assessments-Edema    Edema Circumferential   Circumferential Edema   Circumferential - Right 53 cm  10 cm from distal leg   ROM / Strength   AROM / PROM / Strength AROM;Strength   AROM   AROM Assessment Site Hip   Right/Left Hip Left   Left Hip Extension 0   Strength   Strength Assessment Site Knee;Hip   Right/Left Hip Right;Left   Right Hip Flexion 5/5   Left Hip Flexion 4/5   Left Hip Extension 3-/5   Left Hip ABduction 4-/5   Right/Left Knee Left   Left Knee Extension 5/5   Ambulation/Gait   Ambulation Distance (Feet) 5 Feet  no prothesis    Assistive device Standard walker   Ambulation Surface Level   Gait velocity slow   Pre-Gait Activities able to stand for 3 minutes; therapist stopped                     [facility] Adult PT Treatment/Exercise - 02/27/16 0001    Exercises   Exercises Knee/Hip;Lumbar   Lumbar Exercises: Seated   Other Seated Lumbar Exercises glut and abdominal set x 10    Lumbar Exercises: Supine   Bridge Limitations 2 reps with Lt LE    Straight Leg Raise 10  reps   Lumbar Exercises: Sidelying   Hip Abduction 10 reps                PT Education - 02/27/16 1221    Education provided Yes   Education Details HEP   Person(s) Educated Patient   Methods Explanation   Comprehension Verbalized understanding          PT Short Term Goals - 02/27/16 1548    PT SHORT TERM GOAL #1   Title Pt to be independent in self massage techniques to decrease Rt residual limb swelling in order for prothesis to fit properly.   Time 2   Period Weeks   Status New   PT SHORT TERM GOAL #2   Title Pt to be independent in bilateral leg exercises to increase strength to be able to ambulate without prothesis for 50 feet to be able to walk to the restroom    Time 4   Period Weeks   Status New   PT SHORT TERM GOAL #3   Title Pt to be able to don and doff prothesis independently   Time 3   Period Weeks   Status New   PT SHORT TERM GOAL #4   Title Pt pain to be no greater than a 3/10 to be able to tolerate wearing her prothesis up to 3 hours at a time    Time 4   Period Weeks   Status New           PT Long Term Goals - 02/27/16 1552    PT LONG TERM GOAL #1   Title Pt right hip extension to increase 10 degrees to improve pt gait patttern    Time 6   Period Weeks   Status New   PT LONG TERM GOAL #2   Title Pt pain level to be no greater than a 1/10 to be able to tolerate prothesis being donned for up to six hours at a time.    Time 8   Period Weeks   Status New   PT LONG TERM GOAL #3   Title Pt to be able to stand with no upper extremity assist and reach outside of her base of support without losing her balance to decrease risk of falls   Time 8    Period Weeks   Status New   PT LONG TERM GOAL #4   Title Pt to be able to ambulate with walker and prothesis for over 200 feet to allow pt to walk with prothesis into restraurants/ church or movies.    Time 8   Period Weeks   Status New               Plan - 02/27/16 1222    Clinical Impression Statement (p) Ms. Mahala is a 65 yo  female who had a Rt AKA in February.  She has been hospitalized three times since then due to exacerbation of her COPD.  Her past medical history includes CVA, PVD, COPD, low back pain, CHF and DM.  She has received a prothesis two weeks ago but is unable to don it due to edema in her residual limb.  She is being referred to skilled physcial therapy for strengthening, prosthetic training and gait training.  Examination demonstrates decreased activity tolerance, decrased strength, decreased balance and increased edema.  Ms. Lopresto will benefit from skilled PT to address these issues and improve her mobility and decrease her risk of falling     Rehab Potential (  p) Good   PT Frequency (p) 2x / week   PT Duration (p) 12 weeks   PT Treatment/Interventions (p) ADLs/Self Care Home Management;Gait training;Functional mobility training;Therapeutic activities;Therapeutic exercise;Balance training;Patient/family education;Manual techniques   PT Next Visit Plan (p) Begin manual technique to decrease edema to residual limb, instruct in donning and doffing of prothesis, begin gait training with prothesis at //, begin balance (standing without holding onto walker).    PT Home Exercise Plan (p) given    Consulted and Agree with Plan of Care (p) Patient      Patient will benefit from skilled therapeutic intervention in order to improve the following deficits and impairments:     Visit Diagnosis: Difficulty in walking, not elsewhere classified - Plan: PT plan of care cert/re-cert  Muscle weakness (generalized) - Plan: PT plan of care cert/re-cert  History of falling - Plan:  PT plan of care cert/re-cert  Unsteadiness on feet - Plan: PT plan of care cert/re-cert      G-Codes - [id number]    Functional Assessment Tool Used clinical judgement; pt tolerance to ambulation    Functional Limitation Mobility: Walking and moving around   Mobility: Walking and Moving Around Current Status [id number]) At least 80 percent but less than 100 percent impaired, limited or restricted   Mobility: Walking and Moving Around Goal Status [phone]) At least 60 percent but less than 80 percent impaired, limited or restricted       Problem List Patient Active Problem List   Diagnosis Date Noted  . COPD with acute exacerbation ([place]) 01/08/2016  . Acute respiratory failure with hypoxia ([place]) 01/08/2016  . Sepsis ([place]) 11/14/2015  . Pressure ulcer 11/14/2015  . Hypoxia 11/03/2015  . CHF (congestive heart failure) ([place]) 11/03/2015  . S/P AKA (above knee amputation) unilateral ([place])   . Abnormality of gait   . Acute blood loss anemia   . Postoperative pain of extremity   . Phantom limb pain ([place])   . Essential hypertension   . Chronic obstructive pulmonary disease ([place])   . Chronic low back pain   . Poorly controlled type 2 diabetes mellitus with peripheral neuropathy ([place])   . PVD (peripheral vascular disease) ([place])   . Respiratory rate decreased   . Leukocytosis   . Atherosclerosis of right leg ([place]) 10/30/2015  . Pulmonary edema 08/18/2015  . CHF exacerbation ([place]) 08/18/2015  . SOB (shortness of breath) 08/18/2015  . Open wound of knee, leg (except thigh), and ankle 08/18/2015  . Abnormal echocardiogram   . SVT (supraventricular tachycardia) ([place])   . Lower limb ischemia 04/09/2015  . Critical lower limb ischemia 04/09/2015  . AKI (acute kidney injury) ([place]) 04/09/2015  . Hyperkalemia 04/09/2015  . Pancreatitis 12/26/2013  . Tachycardia 12/26/2013  . Dehydration 12/26/2013  . Vomiting 12/26/2013  . COPD (chronic obstructive pulmonary disease) ([place]) 12/26/2013  .  Essential hypertension, benign 12/26/2013  . Diabetes ([place]) 12/26/2013  . Hypomagnesemia 12/26/2013    Rayetta Humphrey, PT CLT [phone] 02/27/2016, 4:03 PM  [facility] [address] [place], [place], [address] Phone: [phone]   Fax:  [phone]  Name: [name] MRN: [id number] Date of Birth: [birth year]

## 2016-02-27 NOTE — Patient Instructions (Signed)
Isometric Gluteals    Tighten buttock muscles. Repeat _10___ times per set. Do _1___ sets per session. Do __6__ sessions per day. 1 http://orth.exer.us/1126   Copyright  VHI. All rights reserved.  Isometric Abdominal    Lying on back with knees bent, tighten stomach by pressing elbows down. Hold __10__ seconds. Repeat _10___ times per set. Do _1___ sets per session. Do _6___ sessions per day.  http://orth.exer.us/1086   Copyright  VHI. All rights reserved.  Bridging: with Straight Leg Raise    With legs bent, lift buttocks _2___ inches from floor. Then slowly extend right knee, keeping stomach tight. Repeat __1-5__ times per set. Do 1____ sets per session. Do ___3_ sessions per day.  http://orth.exer.us/1104   Copyright  VHI. All rights reserved.  Strengthening: Hip Abduction (Side-Lying)    Tighten muscles on front of left thigh, then lift leg __10__ inches from surface, keeping knee locked.  Repeat 10____ times per set. Do _1___ sets per session. Do __2__ sessions per day. Repeat to right  http://orth.exer.us/622   Copyright  VHI. All rights reserved.

## 2016-02-28 ENCOUNTER — Ambulatory Visit (HOSPITAL_COMMUNITY): Payer: Medicare Other

## 2016-02-28 DIAGNOSIS — R262 Difficulty in walking, not elsewhere classified: Secondary | ICD-10-CM

## 2016-02-28 DIAGNOSIS — M6281 Muscle weakness (generalized): Secondary | ICD-10-CM

## 2016-02-28 DIAGNOSIS — Z9181 History of falling: Secondary | ICD-10-CM

## 2016-02-28 DIAGNOSIS — R2681 Unsteadiness on feet: Secondary | ICD-10-CM

## 2016-02-28 NOTE — Therapy (Signed)
[place], [place], [address] Phone: [phone]   Fax:  [phone]  Physical Therapy Treatment  Patient Details  Name: [name] MRN: [id number] Date of Birth: 09-26-1950 Referring Provider: Dr. Ruta Hinds   Encounter Date: 02/28/2016      PT End of Session - 02/28/16 1013    Visit Number 2   Number of Visits 25   Date for PT Re-Evaluation 03/28/16   Authorization Type UHC medicare   Authorization - Visit Number 2   Authorization - Number of Visits 10   PT Start Time [phone]   PT Stop Time 1028   PT Time Calculation (min) 38 min   Equipment Utilized During Treatment Gait belt   Activity Tolerance Patient tolerated treatment well;Patient limited by fatigue   Behavior During Therapy [facility] for tasks assessed/performed      Past Medical History  Diagnosis Date  . Hypertension   . COPD (chronic obstructive pulmonary disease) ([place])   . Stroke ([place])   . Chronic lower back pain   . Shingles Dec. 2016    Scalp  . Peripheral vascular disease ([place])   . GERD (gastroesophageal reflux disease)   . Hyperlipidemia   . Anxiety   . Type II diabetes mellitus ([place])   . Arthritis     "back" (10/30/2015)  . CHF (congestive heart failure) ([place])   . Hypoxia     Past Surgical History  Procedure Laterality Date  . Posterior lumbar fusion    . Foot surgery Bilateral     "from standing on concrete all day"  . Peripheral vascular catheterization N/A 04/10/2015    Procedure: Abdominal Aortogram w/Lower Extremity;  Surgeon: Conrad [place], MD;  Location: [place] [facility];  Service: Cardiovascular;  Laterality: N/A;  . Thrombectomy femoral artery Right 04/10/2015    Procedure: THROMBECTOMY RIGHT TIBIAL  ARTERY WITH VEIN PATCH ANGIOPLASTY;  Surgeon: Elam Dutch, MD;  Location: [place];  Service: Vascular;  Laterality: Right;  . Fasciotomy Right 04/10/2015    Procedure: FOUR COMPARTMENT FASCIOTOMY;  Surgeon: Elam Dutch, MD;  [facility];  Service: Vascular;  Laterality: Right;  . Femoral-popliteal bypass graft Right 04/10/2015    Procedure: RIGHT  FEMORAL-BELOW THE KNEE POPLITEAL ARTERY BYPASS GRAFT USING 6 MM X 80 CM PROPATEN GRAFT;  Surgeon: Elam Dutch, MD;  Location: [place];  Service: Vascular;  Laterality: Right;  . Intraoperative arteriogram Right 04/10/2015    Procedure: INTRA OPERATIVE ARTERIOGRAM;  Surgeon: Elam Dutch, MD;  Location: [place];  Service: Vascular;  Laterality: Right;  . Carotid endarterectomy Right 2010    Dr Kellie Simmering   . Peripheral vascular catheterization N/A 10/20/2015    Procedure: Abdominal Aortogram;  Surgeon: Elam Dutch, MD;  Location: [facility];  Service: Cardiovascular;  Laterality: N/A;  . Laparoscopic cholecystectomy    . Back surgery    . Carotid endarterectomy Left 03/2008    Archie Endo 01/15/2011  . Shoulder open rotator cuff repair Right 02/2006    Archie Endo 01/29/2011  . Cardiac catheterization    . 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 Amputation Right 10/30/2015    Procedure: RIGHT ABOVE KNEE AMPUTATION;  Surgeon: Elam Dutch, MD;  Location: [place];  Service: Vascular;  Laterality: Right;    There were no vitals filed for this visit.      Subjective Assessment - 02/28/16 0938    Subjective Pt arrived without prostesis.  Reports pain scale 5/10 LBP.  Reports she walked for 22 feet yesterday and has began HEP   Pertinent History [name] had a AKA on 10/30/2015 was discharged to SNF on 11/03/2015 but returned to the hospital the same day with SOB.  She remained in the hospitao until 11/09/2015 when again she was discharged to SNF; she again returned to the hospital on 2/27 with sepsis and stayed until 11/17/2015.    She was readmitted int o the hospital with exacerbated COPD on 01/06/2106 she went home with her husband.  Hx includes past CVA, COPD , PVD , LBP , CHF and DM    Currently in Pain? Yes   Pain Score 5    Pain Location Back    Pain Orientation Right;Lower   Pain Descriptors / Indicators Sore;Aching   Pain Type Chronic pain   Pain Onset More than a month ago   Pain Frequency Intermittent   Aggravating Factors  At night, Unable to lay on back   Pain Relieving Factors sitting up   Effect of Pain on Daily Activities slows down ADLs              [facility] Adult PT Treatment/Exercise - 02/28/16 0001    Transfers   Transfers Stand Pivot Transfers   Stand Pivot Transfers 4: Min guard   Comments Cueing for safety   Ambulation/Gait   Ambulation Distance (Feet) 6 Feet  no prothesis   Assistive device Standard walker   Ambulation Surface Level   Gait velocity slow   Lumbar Exercises: Supine   Bridge 10 reps   Other Supine Lumbar Exercises isometric hip extension (push back of thigh into mat)   Manual Therapy   Manual Therapy Edema management   Manual therapy comments complete separate rest of treatment   Edema Management Retro massage supine position with LE elevated                PT Education - 02/27/16 1221    Education provided Yes   Education Details HEP   Person(s) Educated Patient   Methods Explanation   Comprehension Verbalized understanding          PT Short Term Goals - 02/27/16 1548    PT SHORT TERM GOAL #1   Title Pt to be independent in self massage techniques to decrease Rt residual limb swelling in order for prothesis to fit properly.   Time 2   Period Weeks   Status New   PT SHORT TERM GOAL #2   Title Pt to be independent in bilateral leg exercises to increase strength to be able to ambulate without prothesis for 50 feet to be able to walk to the restroom    Time 4   Period Weeks   Status New   PT SHORT TERM GOAL #3   Title Pt to be able to don and doff prothesis independently   Time 3   Period Weeks   Status New   PT SHORT TERM GOAL #4   Title Pt pain to be no greater than a 3/10 to be able to tolerate wearing her prothesis up to 3 hours at a time    Time 4    Period Weeks   Status New           PT Long Term Goals - 02/27/16 1552    PT LONG TERM GOAL #1   Title Pt right hip extension to increase 10 degrees to improve pt gait patttern    Time  6   Period Weeks   Status New   PT LONG TERM GOAL #2   Title Pt pain level to be no greater than a 1/10 to be able to tolerate prothesis being donned for up to six hours at a time.    Time 8   Period Weeks   Status New   PT LONG TERM GOAL #3   Title Pt to be able to stand with no upper extremity assist and reach outside of her base of support without losing her balance to decrease risk of falls   Time 8   Period Weeks   Status New   PT LONG TERM GOAL #4   Title Pt to be able to ambulate with walker and prothesis for over 200 feet to allow pt to walk with prothesis into restraurants/ church or movies.    Time 8   Period Weeks   Status New               Plan - 02/28/16 1028    Clinical Impression Statement Reviewed goals, compliance and technqiue with HEP and copy of eval given to pt.  Pt without prothesis this session, encourage pt to bring to next apt to begin instructions for donn/ doffing prothetic limb.  Began session with manual retro massage for edema control to assist with swelling.  Hip strenghtneing therex complete and education on importance of improving hip extension to reduce hip flexion contracture.  Transfer training with complete with cueing for mechanics and safety to reduce risk of fall.  Gait training complete with abiility to ambulate (hop) 6 feet with RW and min assistance.   End of session pt limited by fatigue, no reports of increased pain.     Rehab Potential Good   PT Frequency 2x / week   PT Duration 12 weeks   PT Treatment/Interventions ADLs/Self Care Home Management;Gait training;Functional mobility training;Therapeutic activities;Therapeutic exercise;Balance training;Patient/family education;Prosthetic Training;Manual techniques   PT Next Visit Plan (p) Begin  manual technique to decrease edema to residual limb, instruct in donning and doffing of prothesis, begin gait training with prothesis at //, begin balance (standing without holding onto walker).   PT Home Exercise Plan Given      Patient will benefit from skilled therapeutic intervention in order to improve the following deficits and impairments:  Cardiopulmonary status limiting activity, Decreased activity tolerance, Decreased balance, Decreased mobility, Decreased range of motion, Decreased strength, Increased edema, Difficulty walking  Visit Diagnosis: Difficulty in walking, not elsewhere classified  Muscle weakness (generalized)  Unsteadiness on feet  History of falling    Problem List Patient Active Problem List   Diagnosis Date Noted  . COPD with acute exacerbation ([place]) 01/08/2016  . Acute respiratory failure with hypoxia ([place]) 01/08/2016  . Sepsis ([place]) 11/14/2015  . Pressure ulcer 11/14/2015  . Hypoxia 11/03/2015  . CHF (congestive heart failure) ([place]) 11/03/2015  . S/P AKA (above knee amputation) unilateral ([place])   . Abnormality of gait   . Acute blood loss anemia   . Postoperative pain of extremity   . Phantom limb pain ([place])   . Essential hypertension   . Chronic obstructive pulmonary disease ([place])   . Chronic low back pain   . Poorly controlled type 2 diabetes mellitus with peripheral neuropathy ([place])   . PVD (peripheral vascular disease) ([place])   . Respiratory rate decreased   . Leukocytosis   . Atherosclerosis of right leg ([place]) 10/30/2015  . Pulmonary edema 08/18/2015  . CHF exacerbation ([place]) 08/18/2015  .  SOB (shortness of breath) 08/18/2015  . Open wound of knee, leg (except thigh), and ankle 08/18/2015  . Abnormal echocardiogram   . SVT (supraventricular tachycardia) ([place])   . Lower limb ischemia 04/09/2015  . Critical lower limb ischemia 04/09/2015  . AKI (acute kidney injury) ([place]) 04/09/2015  . Hyperkalemia 04/09/2015  . Pancreatitis 12/26/2013   . Tachycardia 12/26/2013  . Dehydration 12/26/2013  . Vomiting 12/26/2013  . COPD (chronic obstructive pulmonary disease) ([place]) 12/26/2013  . Essential hypertension, benign 12/26/2013  . Diabetes ([place]) 12/26/2013  . Hypomagnesemia 12/26/2013   [name], LPTA; CBIS [phone]  Aldona Lento 02/28/2016, 10:50 AM  [place], [place], [address] Phone: [phone]   Fax:  [phone]  Name: LINDSAY FREVERT MRN: [id number] Date of Birth: 1951-04-23

## 2016-03-04 ENCOUNTER — Ambulatory Visit (HOSPITAL_COMMUNITY): Payer: Medicare Other

## 2016-03-04 DIAGNOSIS — R2681 Unsteadiness on feet: Secondary | ICD-10-CM

## 2016-03-04 DIAGNOSIS — Z9181 History of falling: Secondary | ICD-10-CM

## 2016-03-04 DIAGNOSIS — R262 Difficulty in walking, not elsewhere classified: Secondary | ICD-10-CM | POA: Diagnosis not present

## 2016-03-04 DIAGNOSIS — M6281 Muscle weakness (generalized): Secondary | ICD-10-CM

## 2016-03-05 ENCOUNTER — Ambulatory Visit (HOSPITAL_COMMUNITY): Payer: Medicare Other | Admitting: Physical Therapy

## 2016-03-05 DIAGNOSIS — R262 Difficulty in walking, not elsewhere classified: Secondary | ICD-10-CM

## 2016-03-05 DIAGNOSIS — R2681 Unsteadiness on feet: Secondary | ICD-10-CM

## 2016-03-05 DIAGNOSIS — Z9181 History of falling: Secondary | ICD-10-CM

## 2016-03-05 DIAGNOSIS — M6281 Muscle weakness (generalized): Secondary | ICD-10-CM

## 2016-03-05 NOTE — Therapy (Signed)
[place], [place], [address] Phone: [phone]   Fax:  [phone]  Physical Therapy Treatment  Patient Details  Name: [name] MRN: [id number] Date of Birth: 11/06/1950 Referring Provider: Dr. Ruta Hinds   Encounter Date: 03/05/2016      PT End of Session - 03/05/16 1128    Visit Number 4   Number of Visits 25   Date for PT Re-Evaluation 03/28/16   Authorization Type UHC medicare   Authorization - Visit Number 4   Authorization - Number of Visits 10   PT Start Time 0903   PT Stop Time 0945   PT Time Calculation (min) 42 min   Equipment Utilized During Treatment Gait belt   Activity Tolerance Patient tolerated treatment well;Patient limited by fatigue   Behavior During Therapy [facility] for tasks assessed/performed      Past Medical History  Diagnosis Date  . Hypertension   . COPD (chronic obstructive pulmonary disease) ([place])   . Stroke ([place])   . Chronic lower back pain   . Shingles Dec. 2016    Scalp  . Peripheral vascular disease ([place])   . GERD (gastroesophageal reflux disease)   . Hyperlipidemia   . Anxiety   . Type II diabetes mellitus ([place])   . Arthritis     "back" (10/30/2015)  . CHF (congestive heart failure) ([place])   . Hypoxia     Past Surgical History  Procedure Laterality Date  . Posterior lumbar fusion    . Foot surgery Bilateral     "from standing on concrete all day"  . Peripheral vascular catheterization N/A 04/10/2015    Procedure: Abdominal Aortogram w/Lower Extremity;  Surgeon: Conrad DeWitt, MD;  Location: [place] [facility];  Service: Cardiovascular;  Laterality: N/A;  . Thrombectomy femoral artery Right 04/10/2015    Procedure: THROMBECTOMY RIGHT TIBIAL  ARTERY WITH VEIN PATCH ANGIOPLASTY;  Surgeon: Elam Dutch, MD;  Location: [place];  Service: Vascular;  Laterality: Right;  . Fasciotomy Right 04/10/2015    Procedure: FOUR COMPARTMENT FASCIOTOMY;  Surgeon: Elam Dutch, MD;  Location: [facility];  Service: Vascular;  Laterality: Right;  . Femoral-popliteal bypass graft Right 04/10/2015    Procedure: RIGHT  FEMORAL-BELOW THE KNEE POPLITEAL ARTERY BYPASS GRAFT USING 6 MM X 80 CM PROPATEN GRAFT;  Surgeon: Elam Dutch, MD;  Location: [place];  Service: Vascular;  Laterality: Right;  . Intraoperative arteriogram Right 04/10/2015    Procedure: INTRA OPERATIVE ARTERIOGRAM;  Surgeon: Elam Dutch, MD;  Location: [place];  Service: Vascular;  Laterality: Right;  . Carotid endarterectomy Right 2010    Dr Kellie Simmering   . Peripheral vascular catheterization N/A 10/20/2015    Procedure: Abdominal Aortogram;  Surgeon: Elam Dutch, MD;  Location: [place] [facility];  Service: Cardiovascular;  Laterality: N/A;  . Laparoscopic cholecystectomy    . Back surgery    . Carotid endarterectomy Left 03/2008    Archie Endo 01/15/2011  . Shoulder open rotator cuff repair Right 02/2006    Archie Endo 01/29/2011  . Cardiac catheterization    . 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 Amputation Right 10/30/2015    Procedure: RIGHT ABOVE KNEE AMPUTATION;  Surgeon: Elam Dutch, MD;  Location: [place];  Service: Vascular;  Laterality: Right;    There were no vitals filed for this visit.      Subjective Assessment - 03/05/16 1127    Subjective Pt arrived with prostesis  today.  No reports of pain today.  Reports she has been standing up at the counter.     Pertinent History Ms. Kohtz had a AKA on 10/30/2015 was discharged to SNF on 11/03/2015 but returned to the hospital the same day with SOB.  She remained in the hospitao until 11/09/2015 when again she was discharged to SNF; she again returned to the hospital on 2/27 with sepsis and stayed until 11/17/2015.    She was readmitted int o the hospital with exacerbated COPD on 01/06/2106 she went home with her husband.  Hx includes past CVA, COPD , PVD , LBP , CHF and DM    Currently in Pain? No/denies                          [facility] Adult PT Treatment/Exercise - 03/05/16 0001    Ambulation/Gait   Pre-Gait Activities Donning and doffing of prothesis, stood at // with weight shifting activity side to side, advance Rt leg forward and back and standing with no UE assist.                 PT Education - 03/05/16 1136    Education provided Yes   Education Details donning and doffing shrinker and prothesis including the importance of using socks and the different ply's of socks    Person(s) Educated Patient   Methods Explanation   Comprehension Verbalized understanding          PT Short Term Goals - 03/05/16 1131    PT SHORT TERM GOAL #1   Title Pt to be independent in self massage techniques to decrease Rt residual limb swelling in order for prothesis to fit properly.   Time 2   Period Weeks   Status On-going   PT SHORT TERM GOAL #2   Title Pt to be independent in bilateral leg exercises to increase strength to be able to ambulate without prothesis for 50 feet to be able to walk to the restroom    Time 4   Period Weeks   Status On-going   PT SHORT TERM GOAL #3   Title Pt to be able to don and doff prothesis independently   Time 3   Period Weeks   Status On-going   PT SHORT TERM GOAL #4   Title Pt pain to be no greater than a 3/10 to be able to tolerate wearing her prothesis up to 3 hours at a time    Time 4   Period Weeks   Status On-going           PT Long Term Goals - 03/05/16 1132    PT LONG TERM GOAL #1   Title Pt right hip extension to increase 10 degrees to improve pt gait patttern    Time 6   Period Weeks   Status On-going   PT LONG TERM GOAL #2   Title Pt pain level to be no greater than a 1/10 to be able to tolerate prothesis being donned for up to six hours at a time.    Time 8   Period Weeks   Status On-going   PT LONG TERM GOAL #3   Title Pt to be able to stand with no upper extremity assist and reach outside of her base of support without losing her balance to decrease  risk of falls   Time 8   Period Weeks   Status On-going   PT LONG TERM GOAL #4  Title Pt to be able to ambulate with walker and prothesis for over 200 feet to allow pt to walk with prothesis into restraurants/ church or movies.    Time 8   Period Weeks   Status On-going               Plan - 03/05/16 1129    Clinical Impression Statement Today session focused on pt knowledge of proper wearing of socks with prothesis and when to change form thin to thicker socks as well as donning and dofing of prothesis.  Prothesis appears to be to long for pt; she is going to the prothesitist next week.    Rehab Potential Good   PT Frequency 2x / week   PT Duration 12 weeks   PT Treatment/Interventions ADLs/Self Care Home Management;Gait training;Functional mobility training;Therapeutic activities;Therapeutic exercise;Balance training;Patient/family education;Prosthetic Training;Manual techniques   PT Next Visit Plan continue to work on weight shifting with prothesis and standing with no assistive device.   Consulted and Agree with Plan of Care Patient      Patient will benefit from skilled therapeutic intervention in order to improve the following deficits and impairments:  Cardiopulmonary status limiting activity, Decreased activity tolerance, Decreased balance, Decreased mobility, Decreased range of motion, Decreased strength, Increased edema, Difficulty walking  Visit Diagnosis: Difficulty in walking, not elsewhere classified  Muscle weakness (generalized)  Unsteadiness on feet  History of falling     Problem List Patient Active Problem List   Diagnosis Date Noted  . COPD with acute exacerbation ([place]) 01/08/2016  . Acute respiratory failure with hypoxia ([place]) 01/08/2016  . Sepsis ([place]) 11/14/2015  . Pressure ulcer 11/14/2015  . Hypoxia 11/03/2015  . CHF (congestive heart failure) ([place]) 11/03/2015  . S/P AKA (above knee amputation) unilateral ([place])   . Abnormality of gait    . Acute blood loss anemia   . Postoperative pain of extremity   . Phantom limb pain ([place])   . Essential hypertension   . Chronic obstructive pulmonary disease ([place])   . Chronic low back pain   . Poorly controlled type 2 diabetes mellitus with peripheral neuropathy ([place])   . PVD (peripheral vascular disease) ([place])   . Respiratory rate decreased   . Leukocytosis   . Atherosclerosis of right leg ([place]) 10/30/2015  . Pulmonary edema 08/18/2015  . CHF exacerbation ([place]) 08/18/2015  . SOB (shortness of breath) 08/18/2015  . Open wound of knee, leg (except thigh), and ankle 08/18/2015  . Abnormal echocardiogram   . SVT (supraventricular tachycardia) ([place])   . Lower limb ischemia 04/09/2015  . Critical lower limb ischemia 04/09/2015  . AKI (acute kidney injury) ([place]) 04/09/2015  . Hyperkalemia 04/09/2015  . Pancreatitis 12/26/2013  . Tachycardia 12/26/2013  . Dehydration 12/26/2013  . Vomiting 12/26/2013  . COPD (chronic obstructive pulmonary disease) ([place]) 12/26/2013  . Essential hypertension, benign 12/26/2013  . Diabetes ([place]) 12/26/2013  . Hypomagnesemia 12/26/2013    Rayetta Humphrey, PT CLT [phone] 03/05/2016, 11:38 AM  [facility] [address] [place], [place], [address] Phone: [phone]   Fax:  [phone]  Name: [name] MRN: [id number] Date of Birth: [birth year]

## 2016-03-05 NOTE — Therapy (Signed)
[place], [place], [address] Phone: [phone]   Fax:  [phone]  Physical Therapy Treatment  Patient Details  Name: [name] MRN: [id number] Date of Birth: 1951-03-03 Referring Provider: Dr. Ruta Hinds   Encounter Date: 03/04/2016      PT End of Session - 03/04/16 1807    Visit Number 3   Number of Visits 25   Date for PT Re-Evaluation 03/28/16   Authorization Type [employer] medicare   Authorization - Visit Number 3   Authorization - Number of Visits 10   PT Start Time 1703   PT Stop Time 1730   PT Time Calculation (min) 27 min   Equipment Utilized During Treatment Gait belt   Activity Tolerance Patient tolerated treatment well;Patient limited by fatigue   Behavior During Therapy [facility] for tasks assessed/performed      Past Medical History  Diagnosis Date  . Hypertension   . COPD (chronic obstructive pulmonary disease) ([place])   . Stroke ([place])   . Chronic lower back pain   . Shingles Dec. 2016    Scalp  . Peripheral vascular disease ([place])   . GERD (gastroesophageal reflux disease)   . Hyperlipidemia   . Anxiety   . Type II diabetes mellitus ([place])   . Arthritis     "back" (10/30/2015)  . CHF (congestive heart failure) ([place])   . Hypoxia     Past Surgical History  Procedure Laterality Date  . Posterior lumbar fusion    . Foot surgery Bilateral     "from standing on concrete all day"  . Peripheral vascular catheterization N/A 04/10/2015    Procedure: Abdominal Aortogram w/Lower Extremity;  Surgeon: Conrad Keys, MD;  Location: [place] [facility];  Service: Cardiovascular;  Laterality: N/A;  . Thrombectomy femoral artery Right 04/10/2015    Procedure: THROMBECTOMY RIGHT TIBIAL  ARTERY WITH VEIN PATCH ANGIOPLASTY;  Surgeon: Elam Dutch, MD;  Location: [place];  Service: Vascular;  Laterality: Right;  . Fasciotomy Right 04/10/2015    Procedure: FOUR COMPARTMENT FASCIOTOMY;  Surgeon: Elam Dutch, MD;  Location[facility];  Service: Vascular;  Laterality: Right;  . Femoral-popliteal bypass graft Right 04/10/2015    Procedure: RIGHT  FEMORAL-BELOW THE KNEE POPLITEAL ARTERY BYPASS GRAFT USING 6 MM X 80 CM PROPATEN GRAFT;  Surgeon: Elam Dutch, MD;  Location: [place];  Service: Vascular;  Laterality: Right;  . Intraoperative arteriogram Right 04/10/2015    Procedure: INTRA OPERATIVE ARTERIOGRAM;  Surgeon: Elam Dutch, MD;  Location: [place];  Service: Vascular;  Laterality: Right;  . Carotid endarterectomy Right 2010    Dr Kellie Simmering   . Peripheral vascular catheterization N/A 10/20/2015    Procedure: Abdominal Aortogram;  Surgeon: Elam Dutch, MD;  Location: [place] [facility];  Service: Cardiovascular;  Laterality: N/A;  . Laparoscopic cholecystectomy    . Back surgery    . Carotid endarterectomy Left 03/2008    Archie Endo 01/15/2011  . Shoulder open rotator cuff repair Right 02/2006    Archie Endo 01/29/2011  . Cardiac catheterization    . 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 Amputation Right 10/30/2015    Procedure: RIGHT ABOVE KNEE AMPUTATION;  Surgeon: Elam Dutch, MD;  Location: [facility];  Service: Vascular;  Laterality: Right;    There were no vitals filed for this visit.      Subjective Assessment - 03/04/16 1758    Subjective Pt arrived with prostesis  today.  No reports of pain today.  Reports she has been walking (hopping) at home daily.     Pertinent History [name] had a AKA on 10/30/2015 was discharged to SNF on 11/03/2015 but returned to the hospital the same day with SOB.  She remained in the hospitao until 11/09/2015 when again she was discharged to SNF; she again returned to the hospital on 2/27 with sepsis and stayed until 11/17/2015.    She was readmitted int o the hospital with exacerbated COPD on 01/06/2106 she went home with her husband.  Hx includes past CVA, COPD , PVD , LBP , CHF and DM    Currently in Pain? No/denies            [facility] Adult PT  Treatment/Exercise - 03/04/16 0001    Transfers   Transfers Stand Pivot Transfers   Stand Pivot Transfers 4: Min guard   Comments Cueing for safety   Ambulation/Gait   Ambulation Distance (Feet) 2 Feet  3 sets with prothesis   Assistive device Standard walker   Ambulation Surface Level   Gait velocity slow   Self-Care   Self-Care Other Self-Care Comments   Other Self-Care Comments  Don/Doff prosthesis           PT Short Term Goals - 02/27/16 1548    PT SHORT TERM GOAL #1   Title Pt to be independent in self massage techniques to decrease Rt residual limb swelling in order for prothesis to fit properly.   Time 2   Period Weeks   Status New   PT SHORT TERM GOAL #2   Title Pt to be independent in bilateral leg exercises to increase strength to be able to ambulate without prothesis for 50 feet to be able to walk to the restroom    Time 4   Period Weeks   Status New   PT SHORT TERM GOAL #3   Title Pt to be able to don and doff prothesis independently   Time 3   Period Weeks   Status New   PT SHORT TERM GOAL #4   Title Pt pain to be no greater than a 3/10 to be able to tolerate wearing her prothesis up to 3 hours at a time    Time 4   Period Weeks   Status New           PT Long Term Goals - 02/27/16 1552    PT LONG TERM GOAL #1   Title Pt right hip extension to increase 10 degrees to improve pt gait patttern    Time 6   Period Weeks   Status New   PT LONG TERM GOAL #2   Title Pt pain level to be no greater than a 1/10 to be able to tolerate prothesis being donned for up to six hours at a time.    Time 8   Period Weeks   Status New   PT LONG TERM GOAL #3   Title Pt to be able to stand with no upper extremity assist and reach outside of her base of support without losing her balance to decrease risk of falls   Time 8   Period Weeks   Status New   PT LONG TERM GOAL #4   Title Pt to be able to ambulate with walker and prothesis for over 200 feet to allow pt to  walk with prothesis into restraurants/ church or movies.    Time 8   Period Weeks  Status New               Plan - 03/04/16 1808    Clinical Impression Statement Pt late for apt today, unable to complete full POC today.  Today's session focus on education with self care for donning/doffing prothesis, education for checking limb skin integrity following wearing prothesis and gait training.  Pt limited by fatigue with activity and required diaphragmatic breathing techniques while donning prothesis.  Upon standing noted prostheitc limb too long, difficulty advancing the next step.  Upon removal on limb good skin integrity with no signs of irritation.     Rehab Potential Good   PT Frequency 2x / week   PT Duration 12 weeks   PT Treatment/Interventions ADLs/Self Care Home Management;Gait training;Functional mobility training;Therapeutic activities;Therapeutic exercise;Balance training;Patient/family education;Prosthetic Training;Manual techniques   PT Next Visit Plan (p) Begin manual technique to decrease edema to residual limb, instruct in donning and doffing of prothesis, begin gait training with prothesis at //, begin balance (standing without holding onto walker).      Patient will benefit from skilled therapeutic intervention in order to improve the following deficits and impairments:  Cardiopulmonary status limiting activity, Decreased activity tolerance, Decreased balance, Decreased mobility, Decreased range of motion, Decreased strength, Increased edema, Difficulty walking  Visit Diagnosis: Difficulty in walking, not elsewhere classified  Muscle weakness (generalized)  Unsteadiness on feet  History of falling     Problem List Patient Active Problem List   Diagnosis Date Noted  . COPD with acute exacerbation ([place]) 01/08/2016  . Acute respiratory failure with hypoxia ([place]) 01/08/2016  . Sepsis ([place]) 11/14/2015  . Pressure ulcer 11/14/2015  . Hypoxia 11/03/2015  . CHF  (congestive heart failure) ([place]) 11/03/2015  . S/P AKA (above knee amputation) unilateral ([place])   . Abnormality of gait   . Acute blood loss anemia   . Postoperative pain of extremity   . Phantom limb pain ([place])   . Essential hypertension   . Chronic obstructive pulmonary disease ([place])   . Chronic low back pain   . Poorly controlled type 2 diabetes mellitus with peripheral neuropathy ([place])   . PVD (peripheral vascular disease) ([place])   . Respiratory rate decreased   . Leukocytosis   . Atherosclerosis of right leg ([place]) 10/30/2015  . Pulmonary edema 08/18/2015  . CHF exacerbation ([place]) 08/18/2015  . SOB (shortness of breath) 08/18/2015  . Open wound of knee, leg (except thigh), and ankle 08/18/2015  . Abnormal echocardiogram   . SVT (supraventricular tachycardia) ([place])   . Lower limb ischemia 04/09/2015  . Critical lower limb ischemia 04/09/2015  . AKI (acute kidney injury) ([place]) 04/09/2015  . Hyperkalemia 04/09/2015  . Pancreatitis 12/26/2013  . Tachycardia 12/26/2013  . Dehydration 12/26/2013  . Vomiting 12/26/2013  . COPD (chronic obstructive pulmonary disease) ([place]) 12/26/2013  . Essential hypertension, benign 12/26/2013  . Diabetes ([place]) 12/26/2013  . Hypomagnesemia 12/26/2013   Ihor Austin, LPTA; CBIS [phone]  Aldona Lento 03/05/2016, 7:59 AM  [place], [place], [address] Phone: [phone]   Fax:  [phone]  Name: MANJOT PEIFFER MRN: [id number] Date of Birth: 1951-02-20

## 2016-03-06 ENCOUNTER — Emergency Department (HOSPITAL_COMMUNITY): Payer: Medicare Other

## 2016-03-06 ENCOUNTER — Encounter (HOSPITAL_COMMUNITY): Payer: Self-pay

## 2016-03-06 ENCOUNTER — Other Ambulatory Visit: Payer: Self-pay

## 2016-03-06 ENCOUNTER — Inpatient Hospital Stay (HOSPITAL_COMMUNITY)
Admission: EM | Admit: 2016-03-06 | Discharge: 2016-03-09 | DRG: 191 | Disposition: A | Discharge status: Home / Self Care | Payer: Medicare Other | Attending: Family Medicine | Admitting: Family Medicine

## 2016-03-06 DIAGNOSIS — Z8673 Personal history of transient ischemic attack (TIA), and cerebral infarction without residual deficits: Secondary | ICD-10-CM | POA: Diagnosis not present

## 2016-03-06 DIAGNOSIS — Z79899 Other long term (current) drug therapy: Secondary | ICD-10-CM

## 2016-03-06 DIAGNOSIS — E1151 Type 2 diabetes mellitus with diabetic peripheral angiopathy without gangrene: Secondary | ICD-10-CM | POA: Diagnosis present

## 2016-03-06 DIAGNOSIS — J441 Chronic obstructive pulmonary disease with (acute) exacerbation: Principal | ICD-10-CM | POA: Diagnosis present

## 2016-03-06 DIAGNOSIS — I2510 Atherosclerotic heart disease of native coronary artery without angina pectoris: Secondary | ICD-10-CM | POA: Diagnosis present

## 2016-03-06 DIAGNOSIS — Z794 Long term (current) use of insulin: Secondary | ICD-10-CM | POA: Diagnosis not present

## 2016-03-06 DIAGNOSIS — K219 Gastro-esophageal reflux disease without esophagitis: Secondary | ICD-10-CM | POA: Diagnosis present

## 2016-03-06 DIAGNOSIS — Z981 Arthrodesis status: Secondary | ICD-10-CM

## 2016-03-06 DIAGNOSIS — L03116 Cellulitis of left lower limb: Secondary | ICD-10-CM | POA: Diagnosis present

## 2016-03-06 DIAGNOSIS — J849 Interstitial pulmonary disease, unspecified: Secondary | ICD-10-CM | POA: Diagnosis present

## 2016-03-06 DIAGNOSIS — Z7984 Long term (current) use of oral hypoglycemic drugs: Secondary | ICD-10-CM | POA: Diagnosis not present

## 2016-03-06 DIAGNOSIS — I10 Essential (primary) hypertension: Secondary | ICD-10-CM | POA: Diagnosis not present

## 2016-03-06 DIAGNOSIS — Z89619 Acquired absence of unspecified leg above knee: Secondary | ICD-10-CM

## 2016-03-06 DIAGNOSIS — Z87891 Personal history of nicotine dependence: Secondary | ICD-10-CM | POA: Diagnosis not present

## 2016-03-06 DIAGNOSIS — I509 Heart failure, unspecified: Secondary | ICD-10-CM | POA: Diagnosis present

## 2016-03-06 DIAGNOSIS — I110 Hypertensive heart disease with heart failure: Secondary | ICD-10-CM | POA: Diagnosis present

## 2016-03-06 DIAGNOSIS — Z89611 Acquired absence of right leg above knee: Secondary | ICD-10-CM | POA: Diagnosis not present

## 2016-03-06 DIAGNOSIS — J101 Influenza due to other identified influenza virus with other respiratory manifestations: Secondary | ICD-10-CM | POA: Diagnosis present

## 2016-03-06 DIAGNOSIS — F419 Anxiety disorder, unspecified: Secondary | ICD-10-CM | POA: Diagnosis present

## 2016-03-06 DIAGNOSIS — E119 Type 2 diabetes mellitus without complications: Secondary | ICD-10-CM

## 2016-03-06 DIAGNOSIS — E785 Hyperlipidemia, unspecified: Secondary | ICD-10-CM | POA: Diagnosis present

## 2016-03-06 DIAGNOSIS — Z7982 Long term (current) use of aspirin: Secondary | ICD-10-CM

## 2016-03-06 DIAGNOSIS — G4733 Obstructive sleep apnea (adult) (pediatric): Secondary | ICD-10-CM | POA: Diagnosis present

## 2016-03-06 DIAGNOSIS — I471 Supraventricular tachycardia: Secondary | ICD-10-CM | POA: Diagnosis present

## 2016-03-06 DIAGNOSIS — R0602 Shortness of breath: Secondary | ICD-10-CM | POA: Diagnosis not present

## 2016-03-06 DIAGNOSIS — I4891 Unspecified atrial fibrillation: Secondary | ICD-10-CM | POA: Diagnosis present

## 2016-03-06 LAB — CBC WITH DIFFERENTIAL/PLATELET
BASOS ABS: 0 10*3/uL (ref 0.0–0.1)
Basophils Relative: 0 %
EOS PCT: 2 %
Eosinophils Absolute: 0.2 10*3/uL (ref 0.0–0.7)
HEMATOCRIT: 37.3 % (ref 36.0–46.0)
Hemoglobin: 11 g/dL — ABNORMAL LOW (ref 12.0–15.0)
LYMPHS ABS: 2.6 10*3/uL (ref 0.7–4.0)
LYMPHS PCT: 32 %
MCH: 24.2 pg — AB (ref 26.0–34.0)
MCHC: 29.5 g/dL — ABNORMAL LOW (ref 30.0–36.0)
MCV: 82.2 fL (ref 78.0–100.0)
MONO ABS: 0.8 10*3/uL (ref 0.1–1.0)
MONOS PCT: 9 %
NEUTROS ABS: 4.7 10*3/uL (ref 1.7–7.7)
Neutrophils Relative %: 57 %
Platelets: 347 10*3/uL (ref 150–400)
RBC: 4.54 MIL/uL (ref 3.87–5.11)
RDW: 16.2 % — AB (ref 11.5–15.5)
WBC: 8.3 10*3/uL (ref 4.0–10.5)

## 2016-03-06 LAB — COMPREHENSIVE METABOLIC PANEL
ALT: 13 U/L — ABNORMAL LOW (ref 14–54)
AST: 19 U/L (ref 15–41)
Albumin: 3.8 g/dL (ref 3.5–5.0)
Alkaline Phosphatase: 66 U/L (ref 38–126)
Anion gap: 8 (ref 5–15)
BILIRUBIN TOTAL: 0.8 mg/dL (ref 0.3–1.2)
BUN: 21 mg/dL — AB (ref 6–20)
CHLORIDE: 98 mmol/L — AB (ref 101–111)
CO2: 31 mmol/L (ref 22–32)
CREATININE: 0.94 mg/dL (ref 0.44–1.00)
Calcium: 8.8 mg/dL — ABNORMAL LOW (ref 8.9–10.3)
Glucose, Bld: 189 mg/dL — ABNORMAL HIGH (ref 65–99)
POTASSIUM: 3.6 mmol/L (ref 3.5–5.1)
Sodium: 137 mmol/L (ref 135–145)
TOTAL PROTEIN: 6.9 g/dL (ref 6.5–8.1)

## 2016-03-06 LAB — BLOOD GAS, ARTERIAL
ACID-BASE EXCESS: 7.2 mmol/L — AB (ref 0.0–2.0)
BICARBONATE: 30.6 meq/L — AB (ref 20.0–24.0)
DRAWN BY: 213101
O2 Content: 1.5 L/min
O2 SAT: 94.1 %
PATIENT TEMPERATURE: 37
PO2 ART: 75.5 mmHg — AB (ref 80.0–100.0)
TCO2: 14 mmol/L (ref 0–100)
pCO2 arterial: 47.7 mmHg — ABNORMAL HIGH (ref 35.0–45.0)
pH, Arterial: 7.436 (ref 7.350–7.450)

## 2016-03-06 LAB — I-STAT TROPONIN, ED: Troponin i, poc: 0 ng/mL (ref 0.00–0.08)

## 2016-03-06 LAB — BRAIN NATRIURETIC PEPTIDE: B Natriuretic Peptide: 653 pg/mL — ABNORMAL HIGH (ref 0.0–100.0)

## 2016-03-06 LAB — TROPONIN I

## 2016-03-06 MED ORDER — METHYLPREDNISOLONE SODIUM SUCC 125 MG IJ SOLR
125.0000 mg | Freq: Once | INTRAMUSCULAR | Status: AC
  Administered 2016-03-06: 125 mg via INTRAVENOUS
  Filled 2016-03-06: qty 2

## 2016-03-06 MED ORDER — MAGNESIUM SULFATE 2 GM/50ML IV SOLN
2.0000 g | Freq: Once | INTRAVENOUS | Status: AC
  Administered 2016-03-07: 2 g via INTRAVENOUS
  Filled 2016-03-06: qty 50

## 2016-03-06 MED ORDER — IPRATROPIUM-ALBUTEROL 0.5-2.5 (3) MG/3ML IN SOLN
3.0000 mL | Freq: Once | RESPIRATORY_TRACT | Status: AC
  Administered 2016-03-06: 3 mL via RESPIRATORY_TRACT
  Filled 2016-03-06: qty 3

## 2016-03-06 MED ORDER — FUROSEMIDE 10 MG/ML IJ SOLN
40.0000 mg | Freq: Once | INTRAMUSCULAR | Status: AC
  Administered 2016-03-06: 40 mg via INTRAVENOUS
  Filled 2016-03-06: qty 4

## 2016-03-06 NOTE — ED Notes (Signed)
Edema noted under eyes bilaterally.

## 2016-03-06 NOTE — ED Provider Notes (Signed)
CSN: [id number]     Arrival date & time 03/06/16  2113 History   First MD Initiated Contact with Patient 03/06/16 2120     Chief Complaint  Patient presents with  . Shortness of Breath     (Consider location/radiation/quality/duration/timing/severity/associated sxs/prior Treatment) HPI [name] is a 65 year old female with possible history of COPD, stroke, hypertension, peripheral vascular disease, GERD, anxiety, type 2 diabetes, CHF presenting with shortness of breath.   The patient notes that over the last 3 days she's had increased lower extremity swelling and increased orthopnea requiring her to sleep with her head more elevated in bed.  She notes that yesterday she began having worsening wheezing from her baseline. This morning she woke up with some shortness of breath that progressively worsened throughout the day. At home her O2 sat was noted to be 78% by a family member. She was given 2 albuterol nebulizers without improvement in her breathing.    She denies any cough, fever, chest pain, URI type symptoms.  She does note that when she woke up this morning she had orbital swelling. She noted some blurring with her left eye. Per her report, she had a clot in her right leg leading to amputation.   Past Medical History  Diagnosis Date  . Hypertension   . COPD (chronic obstructive pulmonary disease) ([place])   . Stroke ([place])   . Chronic lower back pain   . Shingles Dec. 2016    Scalp  . Peripheral vascular disease ([place])   . GERD (gastroesophageal reflux disease)   . Hyperlipidemia   . Anxiety   . Type II diabetes mellitus ([place])   . Arthritis     "back" (10/30/2015)  . CHF (congestive heart failure) ([place])   . Hypoxia    Past Surgical History  Procedure Laterality Date  . Posterior lumbar fusion    . Foot surgery Bilateral     "from standing on concrete all day"  . Peripheral vascular catheterization N/A 04/10/2015    Procedure: Abdominal Aortogram w/Lower Extremity;  Surgeon:  Conrad Montezuma, MD;  Location: [place] [facility];  Service: Cardiovascular;  Laterality: N/A;  . Thrombectomy femoral artery Right 04/10/2015    Procedure: THROMBECTOMY RIGHT TIBIAL  ARTERY WITH VEIN PATCH ANGIOPLASTY;  Surgeon: Elam Dutch, MD;  Location: [place];  Service: Vascular;  Laterality: Right;  . Fasciotomy Right 04/10/2015    Procedure: FOUR COMPARTMENT FASCIOTOMY;  Surgeon: Elam Dutch, MD;  Location: [facility] OR;  Service: Vascular;  Laterality: Right;  . Femoral-popliteal bypass graft Right 04/10/2015    Procedure: RIGHT  FEMORAL-BELOW THE KNEE POPLITEAL ARTERY BYPASS GRAFT USING 6 MM X 80 CM PROPATEN GRAFT;  Surgeon: Elam Dutch, MD;  Location: [place];  Service: Vascular;  Laterality: Right;  . Intraoperative arteriogram Right 04/10/2015    Procedure: INTRA OPERATIVE ARTERIOGRAM;  Surgeon: Elam Dutch, MD;  Location: [place];  Service: Vascular;  Laterality: Right;  . Carotid endarterectomy Right 2010    Dr Kellie Simmering   . Peripheral vascular catheterization N/A 10/20/2015    Procedure: Abdominal Aortogram;  Surgeon: Elam Dutch, MD;  Location: [place] [facility];  Service: Cardiovascular;  Laterality: N/A;  . Laparoscopic cholecystectomy    . Back surgery    . Carotid endarterectomy Left 03/2008    Archie Endo 01/15/2011  . Shoulder open rotator cuff repair Right 02/2006    Archie Endo 01/29/2011  . Cardiac catheterization    . 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  Amputation Right 10/30/2015    Procedure: RIGHT ABOVE KNEE AMPUTATION;  Surgeon: Elam Dutch, MD;  Location: [facility] OR;  Service: Vascular;  Laterality: Right;   Family History  Problem Relation Age of Onset  . Cancer Father   . Heart attack Sister   . Stroke Sister   . Hypertension Sister   . Hypertension Brother    Social History  Substance Use Topics  . Smoking status: Former Smoker -- 0.12 packs/day for 49 years    Types: Cigarettes  . Smokeless tobacco: Never Used  . Alcohol Use: No   OB History     Gravida Para Term Preterm AB TAB SAB Ectopic Multiple Living   3 3 3       3      Review of Systems  Constitutional: Negative for fever, chills, diaphoresis, appetite change and fatigue.  HENT: Positive for facial swelling. Negative for congestion, ear pain, hearing loss, nosebleeds, postnasal drip, rhinorrhea, sneezing, sore throat and trouble swallowing.   Eyes: Positive for visual disturbance. Negative for photophobia, pain, redness and itching.  Respiratory: Positive for shortness of breath and wheezing. Negative for cough, choking, chest tightness and stridor.   Cardiovascular: Positive for leg swelling. Negative for chest pain and palpitations.  Gastrointestinal: Positive for abdominal distention. Negative for nausea, vomiting, abdominal pain, diarrhea and constipation.  Endocrine: Negative.   Genitourinary: Negative.   Musculoskeletal: Positive for back pain and gait problem.  Skin: Negative for rash.  Allergic/Immunologic: Negative.   Neurological: Negative for dizziness, tremors, syncope, weakness, light-headedness, numbness and headaches.  Hematological: Negative.   Psychiatric/Behavioral: Negative.       Allergies  Codeine; Penicillins; and Ciprofloxacin  Home Medications   Prior to Admission medications   Medication Sig Start Date End Date Taking? Authorizing Provider  albuterol (PROVENTIL HFA;VENTOLIN HFA) 108 (90 Base) MCG/ACT inhaler Inhale 2 puffs into the lungs every 6 (six) hours as needed for wheezing or shortness of breath.   Yes Historical Provider, MD  ALPRAZolam (XANAX) 0.25 MG tablet Take 0.25 mg by mouth 4 (four) times daily as needed for anxiety.    Yes Historical Provider, MD  aspirin 325 MG tablet Take 1 tablet (325 mg total) by mouth daily. 04/15/15  Yes Thurnell Lose, MD  atorvastatin (LIPITOR) 20 MG tablet Take 1 tablet (20 mg total) by mouth daily at 6 PM. 04/15/15  Yes Thurnell Lose, MD  citalopram (CELEXA) 20 MG tablet Take 20 mg by mouth daily.  07/04/13  Yes Historical Provider, MD  furosemide (LASIX) 20 MG tablet Take 20 mg by mouth daily. 02/21/16  Yes Historical Provider, MD  gabapentin (NEURONTIN) 300 MG capsule Take 300 mg by mouth 3 (three) times daily.   Yes Historical Provider, MD  insulin aspart (NOVOLOG) 100 UNIT/ML injection Inject 10 Units into the skin 3 (three) times daily before meals.    Yes Historical Provider, MD  Insulin Glargine (LANTUS SOLOSTAR) 100 UNIT/ML Solostar Pen Inject 30 Units into the skin at bedtime.    Yes Historical Provider, MD  ipratropium-albuterol (DUONEB) 0.5-2.5 (3) MG/3ML SOLN Take 3 mLs by nebulization every 8 (eight) hours.    Yes Historical Provider, MD  lisinopril (PRINIVIL,ZESTRIL) 20 MG tablet Take 1 tablet (20 mg total) by mouth daily. 11/04/15  Yes Alvia Grove, PA-C  metFORMIN (GLUCOPHAGE) 500 MG tablet Take 500 mg by mouth 2 (two) times daily with a meal.   Yes Historical Provider, MD  metoprolol tartrate (LOPRESSOR) 25 MG tablet Take 25 mg by  mouth 2 (two) times daily.    Yes Historical Provider, MD  naproxen (NAPROSYN) 500 MG tablet Take 500 mg by mouth 2 (two) times daily as needed for mild pain or moderate pain.  03/16/15  Yes Historical Provider, MD  oxyCODONE-acetaminophen (PERCOCET) 10-325 MG tablet Take 1 tablet by mouth 4 (four) times daily as needed for pain.  02/16/16  Yes Historical Provider, MD  pantoprazole (PROTONIX) 40 MG tablet Take 40 mg by mouth daily.   Yes Historical Provider, MD  predniSONE (DELTASONE) 20 MG tablet Take 1 tablet (20 mg total) by mouth daily with breakfast. Patient not taking: Reported on 03/06/2016 01/12/16   Lucia Gaskins, MD   BP 147/87 mmHg  Pulse 117  Temp(Src) 97.4 F (36.3 C) (Oral)  Resp 15  Ht 5\' 5"  (1.651 m)  Wt 99.791 kg  BMI 36.61 kg/m2  SpO2 95% Physical Exam  Constitutional: She is oriented to person, place, and time. She appears well-developed and well-nourished.  Audibly wheezing. Mild distress but speaking in full sentences   HENT:  Head: Normocephalic.  Right Ear: External ear normal.  Left Ear: External ear normal.  Nose: Nose normal.  Mouth/Throat: Oropharynx is clear and moist. No oropharyngeal exudate.  Eyes: Conjunctivae are normal. Pupils are equal, round, and reactive to light. Right eye exhibits no discharge. Left eye exhibits no discharge. No scleral icterus.  Periorbital swelling, more prominent on the right than the left  Neck: Neck supple. No JVD present.  Cardiovascular: Regular rhythm, normal heart sounds and intact distal pulses.  Exam reveals no gallop and no friction rub.   No murmur heard. Tachycardic to the 100s  Pulmonary/Chest: No stridor.  Mild increased work of breathing. Poor air movement bilaterally most notable in the bases. Expiratory wheeze. No crackles or rhonchi noted.  Abdominal: Soft. Bowel sounds are normal. She exhibits no distension. There is no tenderness. There is no rebound and no guarding.  Musculoskeletal:  Status post a AKA on the right. 2+ pitting edema past the knee on the left.  Lymphadenopathy:    She has no cervical adenopathy.  Neurological: She is alert and oriented to person, place, and time. She exhibits normal muscle tone.  Skin: Skin is warm. No rash noted. She is not diaphoretic.  Psychiatric: She has a normal mood and affect.    ED Course  Procedures (including critical care time)  2120: Patient noted to have some increased work of breathing. We'll obtain labs to help differentiate between reactive and pulmonary etiologies.  We'll also get a chest x-ray. Ordered a DuoNeb and Solu-Medrol IV given her significant history of COPD, wheeze, and oxygen requirement.  2230: Mild improvement with Duoneb. BNP elevated to 653.   2245:CXR noted to have bibasilar interstitial edema consistent with volume overload. We'll give Lasix 20 mg IV. Patient continues to have poor air movement, however appears more comfortable on nasal cannula.  2300: Paged [facility] for admission. We'll give magnesium sulfate 2 g  Labs Review Labs Reviewed  COMPREHENSIVE METABOLIC PANEL - Abnormal; Notable for the following:    Chloride 98 (*)    Glucose, Bld 189 (*)    BUN 21 (*)    Calcium 8.8 (*)    ALT 13 (*)    All other components within normal limits  CBC WITH DIFFERENTIAL/PLATELET - Abnormal; Notable for the following:    Hemoglobin 11.0 (*)    MCH 24.2 (*)    MCHC 29.5 (*)    RDW 16.2 (*)  All other components within normal limits  BRAIN NATRIURETIC PEPTIDE - Abnormal; Notable for the following:    B Natriuretic Peptide 653.0 (*)    All other components within normal limits  BLOOD GAS, ARTERIAL - Abnormal; Notable for the following:    pCO2 arterial 47.7 (*)    pO2, Arterial 75.5 (*)    Bicarbonate 30.6 (*)    Acid-Base Excess 7.2 (*)    Allens test (pass/fail) NOT INDICATED (*)    All other components within normal limits  TROPONIN I  I-STAT TROPOININ, ED    Imaging Review Dg Chest 2 View  03/06/2016  CLINICAL DATA:  Sob, general chest pain, non productive cough, LLE Swelling onset today. History of CHF, DM, HTN, COPD, stroke, cardiac cath, femoral popliteal bypass graft. EXAM: CHEST - 2 VIEW COMPARISON:  01/07/2016 FINDINGS: Mild bibasilar interstitial infiltrate edema, slightly improved since previous. There are small bilateral pleural effusions. Mild cardiomegaly. Atheromatous aortic arch. Visualized bones unremarkable. IMPRESSION: 1. Bibasilar interstitial edema, pleural effusions, and cardiomegaly suggesting CHF, marginally improved since previous exam Electronically Signed   By: Lucrezia Europe M.D.   On: 03/06/2016 22:42   I have personally reviewed and evaluated these images and lab results as part of my medical decision-making.   EKG Interpretation None      MDM   Final diagnoses:  COPD exacerbation [facility])   [name] is a 65 y/o female with a PMHx of CHF and COPD s/p AKA on the right due to a clot presenting with SOB and  wheeze.  The patient's last echocardiogram revealed an EF of 50-55% with an indeterminate diastolic function. BMP elevated and chest x-ray consistent with a CHF exacerbation, however the patient's physical exam seems more consistent with a COPD exacerbation. She has had breathing treatments, steroids, and magnesium sulfate, and continues to have poor air movement. The patient's significant comorbidities include to improve in the ED, the hospital team was paged for admission.     Archie Patten, MD 03/07/16 0000  Ezequiel Essex, MD 03/07/16 [phone]

## 2016-03-06 NOTE — ED Provider Notes (Signed)
By signing my name below, I, Emmanuella Mensah, attest that this documentation has been prepared under the direction and in the presence of Ezequiel Essex, MD. Electronically Signed: Judithann Sauger, ED Scribe. 03/06/2016. 9:56 PM.    I saw and evaluated the patient, reviewed the resident's note and I agree with the findings and plan. If applicable, I agree with the resident's interpretation of the EKG.  If applicable, I was present for critical portions of any procedures performed.   HPI Comments: [name] is a 65 y.o. female with a hx of CHF and COPD who presents to the Emergency Department complaining of ongoing moderate shortness of breath onset today. She reports associated chest pain, non-productive cough, and swelling to her LLE. No alleviating factors noted. She reports that she had a breathing treatment at home aprrox. 2 hours ago. She states that she uses a nebulizer treatment 3 times a day at baseline. She denies any home O2 use. Pt states that she has been compliant with her Lasix and 325 mg Aspirin. She denies a hx of MI or stents. No fever, chills, or n/v.   Physical Exam: Constitutional: Pt is NAD Cardiac: Tahycardia to 120 Respiratory: Decreased air exchange with tight wheezing.  Musculoskeletal: Right AKA, left leg with edema to left thigh, slight erythema, +1 DP/PT pulses  Nebs, steroids, IV lasix, CXR  COPD exacerbation, possible CHF exacerbation as well.     I personally performed the services described in this documentation, which was scribed in my presence. The recorded information has been reviewed and is accurate.   Ezequiel Essex, MD 03/07/16 [phone]

## 2016-03-06 NOTE — H&P (Signed)
History and Physical    [name] [id number] DOB: [birth year] DOA: 03/06/2016  Referring MD/NP/PA: Ezequiel Essex, MD PCP: Maricela Curet, MD  Outpatient Specialists:  Patient coming from: Home   Chief Complaint: Shortness of breath   HPI: [name] is a 65 y.o. female with medical history significant of HTN, DM Type 2, CHF, peripheral vascular disease, and COPD presented with complaints of moderate shortness of breath with associated chest pain, non-productive cough, and swelling in LLE that onset today. Erythema in left leg has been intermittent for the last few weeks. Per family patient noted to desat to around 78% at home. Family also reports that she has had noticeable facial and LLE swelling. Denies any tobacco use or exposure. There were no alleviating factors noted.   ED Course: Provided 2 albuterol treatments with no improvement in wheezing.  PCO2 47.7, BNP 653.0, CXR revealed bibasilar interstitial edema, pleural effusions, and cardiomegaly suggesting CHF, marginally improved since previous exam. CBC, BMP, and CMP unremarkable.   Review of Systems: As per HPI otherwise 10 point review of systems negative.    Past Medical History  Diagnosis Date  . Hypertension   . COPD (chronic obstructive pulmonary disease) ([place])   . Stroke ([place])   . Chronic lower back pain   . Shingles Dec. 2016    Scalp  . Peripheral vascular disease ([place])   . GERD (gastroesophageal reflux disease)   . Hyperlipidemia   . Anxiety   . Type II diabetes mellitus ([place])   . Arthritis     "back" (10/30/2015)  . CHF (congestive heart failure) ([place])   . Hypoxia     Past Surgical History  Procedure Laterality Date  . Posterior lumbar fusion    . Foot surgery Bilateral     "from standing on concrete all day"  . Peripheral vascular catheterization N/A 04/10/2015    Procedure: Abdominal Aortogram w/Lower Extremity;  Surgeon: Conrad Bridgetown, MD;  Location: [place] [facility];  Service:  Cardiovascular;  Laterality: N/A;  . Thrombectomy femoral artery Right 04/10/2015    Procedure: THROMBECTOMY RIGHT TIBIAL  ARTERY WITH VEIN PATCH ANGIOPLASTY;  Surgeon: Elam Dutch, MD;  Location: [facility];  Service: Vascular;  Laterality: Right;  . Fasciotomy Right 04/10/2015    Procedure: FOUR COMPARTMENT FASCIOTOMY;  Surgeon: Elam Dutch, MD;  Location: [facility] OR;  Service: Vascular;  Laterality: Right;  . Femoral-popliteal bypass graft Right 04/10/2015    Procedure: RIGHT  FEMORAL-BELOW THE KNEE POPLITEAL ARTERY BYPASS GRAFT USING 6 MM X 80 CM PROPATEN GRAFT;  Surgeon: Elam Dutch, MD;  Location: [place];  Service: Vascular;  Laterality: Right;  . Intraoperative arteriogram Right 04/10/2015    Procedure: INTRA OPERATIVE ARTERIOGRAM;  Surgeon: Elam Dutch, MD;  Location: [place];  Service: Vascular;  Laterality: Right;  . Carotid endarterectomy Right 2010    Dr Kellie Simmering   . Peripheral vascular catheterization N/A 10/20/2015    Procedure: Abdominal Aortogram;  Surgeon: Elam Dutch, MD;  Location: [place] [facility];  Service: Cardiovascular;  Laterality: N/A;  . Laparoscopic cholecystectomy    . Back surgery    . Carotid endarterectomy Left 03/2008    Archie Endo 01/15/2011  . Shoulder open rotator cuff repair Right 02/2006    Archie Endo 01/29/2011  . Cardiac catheterization    . 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 Amputation Right 10/30/2015    Procedure: RIGHT ABOVE KNEE AMPUTATION;  Surgeon: Elam Dutch, MD;  Location: [facility]  OR;  Service: Vascular;  Laterality: Right;     reports that she has quit smoking. Her smoking use included Cigarettes. She has a 5.88 pack-year smoking history. She has never used smokeless tobacco. She reports that she does not drink alcohol or use illicit drugs.  Allergies  Allergen Reactions  . Codeine Rash  . Penicillins Other (See Comments)    Has patient had a PCN reaction causing immediate rash, facial/tongue/throat swelling, SOB or lightheadedness  with hypotension: unknown Has patient had a PCN reaction causing severe rash involving mucus membranes or skin necrosis: unknown Has patient had a PCN reaction that required hospitalization unknown Has patient had a PCN reaction occurring within the last 10 years: unknown If all of the above answers are "NO", then may proceed with Cephalosporin use.  . Ciprofloxacin Hives    Family History  Problem Relation Age of Onset  . Cancer Father   . Heart attack Sister   . Stroke Sister   . Hypertension Sister   . Hypertension Brother    Unacceptable: Noncontributory, unremarkable, or negative. Acceptable: Family history reviewed and not pertinent (If you reviewed it)  Prior to Admission medications   Medication Sig Start Date End Date Taking? Authorizing Provider  albuterol (PROVENTIL HFA;VENTOLIN HFA) 108 (90 Base) MCG/ACT inhaler Inhale 2 puffs into the lungs every 6 (six) hours as needed for wheezing or shortness of breath.   Yes Historical Provider, MD  ALPRAZolam (XANAX) 0.25 MG tablet Take 0.25 mg by mouth 4 (four) times daily as needed for anxiety.    Yes Historical Provider, MD  aspirin 325 MG tablet Take 1 tablet (325 mg total) by mouth daily. 04/15/15  Yes Thurnell Lose, MD  atorvastatin (LIPITOR) 20 MG tablet Take 1 tablet (20 mg total) by mouth daily at 6 PM. 04/15/15  Yes Thurnell Lose, MD  citalopram (CELEXA) 20 MG tablet Take 20 mg by mouth daily. 07/04/13  Yes Historical Provider, MD  furosemide (LASIX) 20 MG tablet Take 20 mg by mouth daily. 02/21/16  Yes Historical Provider, MD  gabapentin (NEURONTIN) 300 MG capsule Take 300 mg by mouth 3 (three) times daily.   Yes Historical Provider, MD  insulin aspart (NOVOLOG) 100 UNIT/ML injection Inject 10 Units into the skin 3 (three) times daily before meals.    Yes Historical Provider, MD  Insulin Glargine (LANTUS SOLOSTAR) 100 UNIT/ML Solostar Pen Inject 30 Units into the skin at bedtime.    Yes Historical Provider, MD    ipratropium-albuterol (DUONEB) 0.5-2.5 (3) MG/3ML SOLN Take 3 mLs by nebulization every 8 (eight) hours.    Yes Historical Provider, MD  lisinopril (PRINIVIL,ZESTRIL) 20 MG tablet Take 1 tablet (20 mg total) by mouth daily. 11/04/15  Yes Alvia Grove, PA-C  metFORMIN (GLUCOPHAGE) 500 MG tablet Take 500 mg by mouth 2 (two) times daily with a meal.   Yes Historical Provider, MD  metoprolol tartrate (LOPRESSOR) 25 MG tablet Take 25 mg by mouth 2 (two) times daily.    Yes Historical Provider, MD  naproxen (NAPROSYN) 500 MG tablet Take 500 mg by mouth 2 (two) times daily as needed for mild pain or moderate pain.  03/16/15  Yes Historical Provider, MD  oxyCODONE-acetaminophen (PERCOCET) 10-325 MG tablet Take 1 tablet by mouth 4 (four) times daily as needed for pain.  02/16/16  Yes Historical Provider, MD  pantoprazole (PROTONIX) 40 MG tablet Take 40 mg by mouth daily.   Yes Historical Provider, MD  predniSONE (DELTASONE) 20 MG tablet Take 1  tablet (20 mg total) by mouth daily with breakfast. Patient not taking: Reported on 03/06/2016 01/12/16   Lucia Gaskins, MD    Physical Exam: Filed Vitals:   03/06/16 2130 03/06/16 2156 03/06/16 2200 03/06/16 2230  BP: 150/85  139/86 115/83  Pulse: 117  116 117  Temp:      TempSrc:      Resp: 14  20 19   Height:      Weight:      SpO2: 92% 93% 97% 94%      Constitutional: NAD, calm, comfortable Filed Vitals:   03/06/16 2130 03/06/16 2156 03/06/16 2200 03/06/16 2230  BP: 150/85  139/86 115/83  Pulse: 117  116 117  Temp:      TempSrc:      Resp: 14  20 19   Height:      Weight:      SpO2: 92% 93% 97% 94%   Eyes: PERRL, lids and conjunctivae normal ENMT: Mucous membranes are moist. Posterior pharynx clear of any exudate or lesions.Normal dentition.  Neck: normal, supple, no masses, no thyromegaly Respiratory: Mild wheezing, no crackles. Normal respiratory effort. No accessory muscle use.  Cardiovascular: Regular rate and rhythm, no murmurs / rubs  / gallops. No extremity edema. 2+ pedal pulses. No carotid bruits.  Abdomen: no tenderness, no masses palpated. No hepatosplenomegaly. Bowel sounds positive.  Musculoskeletal: right BKA. LLE with mild erythema  Neurologic: CN 2-12 grossly intact. Sensation intact, DTR normal. Strength 5/5 in all 4.  Psychiatric: Normal judgment and insight. Alert and oriented x 3. Normal mood.   Labs on Admission: I have personally reviewed following labs and imaging studies  CBC:  Recent Labs Lab 03/06/16 2134  WBC 8.3  NEUTROABS 4.7  HGB 11.0*  HCT 37.3  MCV 82.2  PLT [id number]   Basic Metabolic Panel:  Recent Labs Lab 03/06/16 2134  NA 137  K 3.6  CL 98*  CO2 31  GLUCOSE 189*  BUN 21*  CREATININE 0.94  CALCIUM 8.8*   Liver Function Tests:  Recent Labs Lab 03/06/16 2134  AST 19  ALT 13*  ALKPHOS 66  BILITOT 0.8  PROT 6.9  ALBUMIN 3.8   Cardiac Enzymes:  Recent Labs Lab 03/06/16 2134  TROPONINI <0.03   Urine analysis:    Component Value Date/Time   COLORURINE YELLOW 11/13/2015 [place] 11/13/2015 2320   LABSPEC 1.025 11/13/2015 2320   PHURINE 5.5 11/13/2015 2320   GLUCOSEU >1000* 11/13/2015 2320   HGBUR TRACE* 11/13/2015 2320   BILIRUBINUR NEGATIVE 11/13/2015 2320   KETONESUR NEGATIVE 11/13/2015 2320   PROTEINUR TRACE* 11/13/2015 2320   UROBILINOGEN 1.0 04/12/2015 0915   NITRITE NEGATIVE 11/13/2015 2320   LEUKOCYTESUR NEGATIVE 11/13/2015 2320    Radiological Exams on Admission: Dg Chest 2 View  03/06/2016  CLINICAL DATA:  Sob, general chest pain, non productive cough, LLE Swelling onset today. History of CHF, DM, HTN, COPD, stroke, cardiac cath, femoral popliteal bypass graft. EXAM: CHEST - 2 VIEW COMPARISON:  01/07/2016 FINDINGS: Mild bibasilar interstitial infiltrate edema, slightly improved since previous. There are small bilateral pleural effusions. Mild cardiomegaly. Atheromatous aortic arch. Visualized bones unremarkable. IMPRESSION: 1.  Bibasilar interstitial edema, pleural effusions, and cardiomegaly suggesting CHF, marginally improved since previous exam Electronically Signed   By: Lucrezia Europe M.D.   On: 03/06/2016 22:42    Assessment/Plan   1. Cellulitis of LLE. Patient noted to have mild erythema and edema of the LLE. Started on lasix and rocephin.  2. COPD exacerbation,  patient with mild wheezing. Started Rocephin, nebs, and continue with IV steroids.  3. DM Type 2, blood sugars stable. Will continue her home meds and insulin.  4. Essential HTN, stable.  5. PVD.   DVT prophylaxis:  Heparin SQ.  Code Status: Full Family Communication: Family bedside.  Disposition Plan: Anticipate discharge in 24-48 hours.  Consults called: None Admission status: OBS.    Orvan Falconer MD FACP.  [facility] If 7PM-7AM, please contact night-coverage [email or web address] Password [email or web address]  03/06/2016, 11:19 PM    By signing my name below, I, Rennis Harding, attest that this documentation has been prepared under the direction and in the presence of Orvan Falconer, MD. Electronically signed: Rennis Harding, Scribe. 03/06/2016 11:26pm I, Dr. Orvan Falconer, personally performed the services described in this documentation. All medical record entries made by the scribe were at my discretion and in my presence. Orvan Falconer, MD 03/06/2016

## 2016-03-06 NOTE — ED Notes (Signed)
Difficulty breathing X2 days with increase tonight. Patient c/o fluid retention, +4 pitting edema noted to LLE. Patient also c/o bilateral periorbital swelling. History of CHF.

## 2016-03-07 ENCOUNTER — Encounter (HOSPITAL_COMMUNITY): Payer: Self-pay | Admitting: *Deleted

## 2016-03-07 ENCOUNTER — Telehealth (HOSPITAL_COMMUNITY): Payer: Self-pay | Admitting: Physical Therapy

## 2016-03-07 ENCOUNTER — Ambulatory Visit (HOSPITAL_COMMUNITY): Payer: Medicare Other | Admitting: Physical Therapy

## 2016-03-07 LAB — BASIC METABOLIC PANEL
Anion gap: 11 (ref 5–15)
BUN: 22 mg/dL — ABNORMAL HIGH (ref 6–20)
CHLORIDE: 95 mmol/L — AB (ref 101–111)
CO2: 32 mmol/L (ref 22–32)
CREATININE: 0.99 mg/dL (ref 0.44–1.00)
Calcium: 8.7 mg/dL — ABNORMAL LOW (ref 8.9–10.3)
GFR calc non Af Amer: 59 mL/min — ABNORMAL LOW (ref >60)
Glucose, Bld: 258 mg/dL — ABNORMAL HIGH (ref 65–99)
Potassium: 4 mmol/L (ref 3.5–5.1)
Sodium: 138 mmol/L (ref 135–145)

## 2016-03-07 LAB — CBC
HCT: 36.6 % (ref 36.0–46.0)
Hemoglobin: 10.9 g/dL — ABNORMAL LOW (ref 12.0–15.0)
MCH: 24.3 pg — AB (ref 26.0–34.0)
MCHC: 29.8 g/dL — ABNORMAL LOW (ref 30.0–36.0)
MCV: 81.5 fL (ref 78.0–100.0)
PLATELETS: 328 10*3/uL (ref 150–400)
RBC: 4.49 MIL/uL (ref 3.87–5.11)
RDW: 16.5 % — ABNORMAL HIGH (ref 11.5–15.5)
WBC: 8.5 10*3/uL (ref 4.0–10.5)

## 2016-03-07 LAB — GLUCOSE, CAPILLARY
GLUCOSE-CAPILLARY: 269 mg/dL — AB (ref 65–99)
GLUCOSE-CAPILLARY: 344 mg/dL — AB (ref 65–99)
GLUCOSE-CAPILLARY: 183 mg/dL — AB (ref 65–99)
GLUCOSE-CAPILLARY: 197 mg/dL — AB (ref 65–99)
Glucose-Capillary: 236 mg/dL — ABNORMAL HIGH (ref 65–99)

## 2016-03-07 MED ORDER — OSELTAMIVIR PHOSPHATE 75 MG PO CAPS: 75.0000 mg | ORAL_CAPSULE | Freq: Two times a day (BID) | ORAL | Status: DC

## 2016-03-07 MED ORDER — ONDANSETRON HCL 4 MG/2ML IJ SOLN: 4.0000 mg | Freq: Four times a day (QID) | INTRAMUSCULAR | Status: DC | PRN

## 2016-03-07 MED ORDER — INSULIN ASPART 100 UNIT/ML ~~LOC~~ SOLN
0.0000 [IU] | Freq: Every day | SUBCUTANEOUS | Status: DC
  Administered 2016-03-07: 2 [IU] via SUBCUTANEOUS
  Administered 2016-03-08: 4 [IU] via SUBCUTANEOUS

## 2016-03-07 MED ORDER — ASPIRIN 325 MG PO TABS
325.0000 mg | ORAL_TABLET | Freq: Every day | ORAL | Status: DC
  Administered 2016-03-07 – 2016-03-09 (×3): 325 mg via ORAL
  Filled 2016-03-07 (×3): qty 1

## 2016-03-07 MED ORDER — DEXTROSE 5 % IV SOLN
INTRAVENOUS | Status: AC
  Filled 2016-03-07: qty 2

## 2016-03-07 MED ORDER — SODIUM CHLORIDE 0.9% FLUSH
3.0000 mL | Freq: Two times a day (BID) | INTRAVENOUS | Status: DC
  Administered 2016-03-07 – 2016-03-09 (×4): 3 mL via INTRAVENOUS

## 2016-03-07 MED ORDER — DEXTROSE 5 % IV SOLN
2.0000 g | INTRAVENOUS | Status: DC
  Administered 2016-03-07 – 2016-03-09 (×3): 2 g via INTRAVENOUS
  Filled 2016-03-07 (×5): qty 2

## 2016-03-07 MED ORDER — PANTOPRAZOLE SODIUM 40 MG PO TBEC
40.0000 mg | DELAYED_RELEASE_TABLET | Freq: Every day | ORAL | Status: DC
  Administered 2016-03-07 – 2016-03-09 (×3): 40 mg via ORAL
  Filled 2016-03-07 (×3): qty 1

## 2016-03-07 MED ORDER — INSULIN GLARGINE 100 UNIT/ML ~~LOC~~ SOLN
30.0000 [IU] | Freq: Every day | SUBCUTANEOUS | Status: DC
  Administered 2016-03-07 – 2016-03-08 (×3): 30 [IU] via SUBCUTANEOUS
  Filled 2016-03-07 (×5): qty 0.3

## 2016-03-07 MED ORDER — IPRATROPIUM-ALBUTEROL 0.5-2.5 (3) MG/3ML IN SOLN
3.0000 mL | Freq: Three times a day (TID) | RESPIRATORY_TRACT | Status: DC
  Administered 2016-03-07 (×2): 3 mL via RESPIRATORY_TRACT
  Filled 2016-03-07 (×2): qty 3

## 2016-03-07 MED ORDER — ALPRAZOLAM 0.25 MG PO TABS
0.2500 mg | ORAL_TABLET | Freq: Four times a day (QID) | ORAL | Status: DC | PRN
  Administered 2016-03-07 – 2016-03-09 (×4): 0.25 mg via ORAL
  Filled 2016-03-07 (×4): qty 1

## 2016-03-07 MED ORDER — DEXTROSE 5 % IV SOLN
500.0000 mg | INTRAVENOUS | Status: DC
  Administered 2016-03-07 – 2016-03-08 (×2): 500 mg via INTRAVENOUS
  Filled 2016-03-07 (×4): qty 500

## 2016-03-07 MED ORDER — FUROSEMIDE 10 MG/ML IJ SOLN
40.0000 mg | Freq: Two times a day (BID) | INTRAMUSCULAR | Status: DC
  Administered 2016-03-07 – 2016-03-09 (×5): 40 mg via INTRAVENOUS
  Filled 2016-03-07 (×5): qty 4

## 2016-03-07 MED ORDER — GABAPENTIN 300 MG PO CAPS
300.0000 mg | ORAL_CAPSULE | Freq: Three times a day (TID) | ORAL | Status: DC
  Administered 2016-03-07 – 2016-03-09 (×7): 300 mg via ORAL
  Filled 2016-03-07 (×7): qty 1

## 2016-03-07 MED ORDER — ONDANSETRON HCL 4 MG PO TABS: 4.0000 mg | ORAL_TABLET | Freq: Four times a day (QID) | ORAL | Status: DC | PRN

## 2016-03-07 MED ORDER — LISINOPRIL 10 MG PO TABS
20.0000 mg | ORAL_TABLET | Freq: Every day | ORAL | Status: DC
  Administered 2016-03-07 – 2016-03-09 (×3): 20 mg via ORAL
  Filled 2016-03-07 (×3): qty 2

## 2016-03-07 MED ORDER — INSULIN ASPART 100 UNIT/ML ~~LOC~~ SOLN
0.0000 [IU] | Freq: Three times a day (TID) | SUBCUTANEOUS | Status: DC
  Administered 2016-03-07: 4 [IU] via SUBCUTANEOUS
  Administered 2016-03-07: 11 [IU] via SUBCUTANEOUS
  Administered 2016-03-07: 15 [IU] via SUBCUTANEOUS
  Administered 2016-03-08: 11 [IU] via SUBCUTANEOUS
  Administered 2016-03-08: 4 [IU] via SUBCUTANEOUS
  Administered 2016-03-08: 7 [IU] via SUBCUTANEOUS
  Administered 2016-03-09: 4 [IU] via SUBCUTANEOUS

## 2016-03-07 MED ORDER — IPRATROPIUM-ALBUTEROL 0.5-2.5 (3) MG/3ML IN SOLN
3.0000 mL | Freq: Four times a day (QID) | RESPIRATORY_TRACT | Status: DC
  Administered 2016-03-07 – 2016-03-08 (×3): 3 mL via RESPIRATORY_TRACT
  Filled 2016-03-07 (×3): qty 3

## 2016-03-07 MED ORDER — HEPARIN SODIUM (PORCINE) 5000 UNIT/ML IJ SOLN
5000.0000 [IU] | Freq: Three times a day (TID) | INTRAMUSCULAR | Status: DC
  Administered 2016-03-07 – 2016-03-09 (×7): 5000 [IU] via SUBCUTANEOUS
  Filled 2016-03-07 (×7): qty 1

## 2016-03-07 MED ORDER — METHYLPREDNISOLONE SODIUM SUCC 40 MG IJ SOLR
40.0000 mg | Freq: Four times a day (QID) | INTRAMUSCULAR | Status: DC
  Administered 2016-03-07 – 2016-03-09 (×9): 40 mg via INTRAVENOUS
  Filled 2016-03-07 (×10): qty 1

## 2016-03-07 MED ORDER — METOPROLOL TARTRATE 25 MG PO TABS
25.0000 mg | ORAL_TABLET | Freq: Two times a day (BID) | ORAL | Status: DC
  Administered 2016-03-07 – 2016-03-09 (×6): 25 mg via ORAL
  Filled 2016-03-07 (×6): qty 1

## 2016-03-07 MED ORDER — CITALOPRAM HYDROBROMIDE 20 MG PO TABS
20.0000 mg | ORAL_TABLET | Freq: Every day | ORAL | Status: DC
  Administered 2016-03-07 – 2016-03-09 (×3): 20 mg via ORAL
  Filled 2016-03-07 (×3): qty 1

## 2016-03-07 MED ORDER — ATORVASTATIN CALCIUM 20 MG PO TABS
20.0000 mg | ORAL_TABLET | Freq: Every day | ORAL | Status: DC
  Administered 2016-03-07 – 2016-03-08 (×2): 20 mg via ORAL
  Filled 2016-03-07 (×2): qty 1

## 2016-03-07 MED ORDER — INSULIN ASPART 100 UNIT/ML ~~LOC~~ SOLN
10.0000 [IU] | Freq: Three times a day (TID) | SUBCUTANEOUS | Status: DC
  Administered 2016-03-07 – 2016-03-09 (×7): 10 [IU] via SUBCUTANEOUS

## 2016-03-07 MED ORDER — OXYCODONE-ACETAMINOPHEN 5-325 MG PO TABS
2.0000 | ORAL_TABLET | Freq: Four times a day (QID) | ORAL | Status: DC | PRN
  Administered 2016-03-07 – 2016-03-09 (×8): 2 via ORAL
  Filled 2016-03-07 (×8): qty 2

## 2016-03-07 MED ORDER — METFORMIN HCL 500 MG PO TABS
500.0000 mg | ORAL_TABLET | Freq: Two times a day (BID) | ORAL | Status: DC
  Administered 2016-03-07 – 2016-03-09 (×5): 500 mg via ORAL
  Filled 2016-03-07 (×5): qty 1

## 2016-03-07 NOTE — Telephone Encounter (Signed)
She is in the hospital

## 2016-03-07 NOTE — Care Management Note (Signed)
Case Management Note  Patient Details  Name: DONIS AMADO MRN: [id number] Date of Birth: 1951-04-23  Subjective/Objective:                  Pt admitted with COPD and CHF. Pt is from home, lives alone and is ind with ADL's. Pt's husband is at the bedside. Pt was DC'd from [facility] services through [facility] in May. Pt has been receiving OP PT for help with her prosthesis, she is interested in continuing this at DC. Pt has neb machine but no home O2. Pt states her Oxygen drops less than 88% at home when she checks it. Pt will need home O2 assessment prior to DC. Pt is much less deconditioned that on previous admissions, Anticipate pt will be able to continue OP PT and will not need HH.    Action/Plan: Will cont to follow. Will need home O2 assessment prior to DC.   Expected Discharge Date:  03/09/16               Expected Discharge Plan:  Home/Self Care  In-House Referral:  NA  Discharge planning Services     Post Acute Care Choice:  NA Choice offered to:  NA  DME Arranged:    DME Agency:     HH Arranged:    HH Agency:     Status of Service:  In process, will continue to follow  If discussed at Long Length of Stay Meetings, dates discussed:    Additional Comments:  Sherald Barge, RN 03/07/2016, 2:31 PM

## 2016-03-07 NOTE — Progress Notes (Signed)
Penetrate patient treated for exacerbation of COPD likewise has chest x-ray consistent with volume overload however echo reveals systolic function of [id number] currently on IV Solu-Medrol Rocephin and Zithromax she tested positive for influenza A will start Tamiflu 75 by mouth twice a day continue current therapy including DuoNeb nebulizer [name] [id number] DOB: Aug 15, 1951 DOA: 03/06/2016 PCP: Maricela Curet, MD   Physical Exam: Blood pressure 121/64, pulse 111, temperature 98 F (36.7 C), temperature source Oral, resp. rate 20, height 5\' 5"  (1.651 m), weight 91.51 kg (201 lb 11.9 oz), SpO2 90 %. Lungs show mild inspiratory wheeze scattered rhonchi no rales appreciable heart regular rhythm no S3-S4 no heaves thrills rubs abdomen soft nontender bowel sounds normoactive is a right AKA with new prosthesis for 3 weeks   Investigations:  No results found for this or any previous visit (from the past 240 hour(s)).   Basic Metabolic Panel:  Recent Labs  03/06/16 2134 03/07/16 0511  NA 137 138  K 3.6 4.0  CL 98* 95*  CO2 31 32  GLUCOSE 189* 258*  BUN 21* 22*  CREATININE 0.94 0.99  CALCIUM 8.8* 8.7*   Liver Function Tests:  Recent Labs  03/06/16 2134  AST 19  ALT 13*  ALKPHOS 66  BILITOT 0.8  PROT 6.9  ALBUMIN 3.8     CBC:  Recent Labs  03/06/16 2134 03/07/16 0511  WBC 8.3 8.5  NEUTROABS 4.7  --   HGB 11.0* 10.9*  HCT 37.3 36.6  MCV 82.2 81.5  PLT 347 328    Dg Chest 2 View  03/06/2016  CLINICAL DATA:  Sob, general chest pain, non productive cough, LLE Swelling onset today. History of CHF, DM, HTN, COPD, stroke, cardiac cath, femoral popliteal bypass graft. EXAM: CHEST - 2 VIEW COMPARISON:  01/07/2016 FINDINGS: Mild bibasilar interstitial infiltrate edema, slightly improved since previous. There are small bilateral pleural effusions. Mild cardiomegaly. Atheromatous aortic arch. Visualized bones unremarkable. IMPRESSION: 1. Bibasilar interstitial edema, pleural  effusions, and cardiomegaly suggesting CHF, marginally improved since previous exam Electronically Signed   By: Lucrezia Europe M.D.   On: 03/06/2016 22:42      Medications:  Impression:  Active Problems:   Diabetes (HCC)   SVT (supraventricular tachycardia) (HCC)   S/P AKA (above knee amputation) unilateral (HCC)   Essential hypertension   CHF (congestive heart failure) ([place])   COPD with acute exacerbation (HCC)   COPD exacerbation (HCC)     Plan:Solu-Medrol 125 IV every 8 DuoNeb nebulizer every 6H continue IV Rocephin and Zithromax as well as the addition of Tamiflu 75 by mouth twice a day today  Consultants:    Procedures   Antibiotics: Rocephin and Zithromax. Tamiflu 75 by mouth twice a day added today          Time spent: 30 minutes   LOS: 1 day   [name] M   03/07/2016, 1:27 PM

## 2016-03-08 LAB — BASIC METABOLIC PANEL
ANION GAP: 10 (ref 5–15)
BUN: 32 mg/dL — ABNORMAL HIGH (ref 6–20)
CALCIUM: 8.6 mg/dL — AB (ref 8.9–10.3)
CHLORIDE: 94 mmol/L — AB (ref 101–111)
CO2: 33 mmol/L — AB (ref 22–32)
Creatinine, Ser: 1.25 mg/dL — ABNORMAL HIGH (ref 0.44–1.00)
GFR calc non Af Amer: 44 mL/min — ABNORMAL LOW (ref >60)
GFR, EST AFRICAN AMERICAN: 51 mL/min — AB (ref >60)
GLUCOSE: 177 mg/dL — AB (ref 65–99)
POTASSIUM: 3.9 mmol/L (ref 3.5–5.1)
Sodium: 137 mmol/L (ref 135–145)

## 2016-03-08 LAB — GLUCOSE, CAPILLARY
GLUCOSE-CAPILLARY: 189 mg/dL — AB (ref 65–99)
GLUCOSE-CAPILLARY: 253 mg/dL — AB (ref 65–99)
Glucose-Capillary: 215 mg/dL — ABNORMAL HIGH (ref 65–99)
Glucose-Capillary: 308 mg/dL — ABNORMAL HIGH (ref 65–99)

## 2016-03-08 MED ORDER — FLUTICASONE FUROATE-VILANTEROL 100-25 MCG/INH IN AEPB
1.0000 | INHALATION_SPRAY | Freq: Every day | RESPIRATORY_TRACT | Status: DC
  Administered 2016-03-08 – 2016-03-09 (×2): 1 via RESPIRATORY_TRACT
  Filled 2016-03-08: qty 28

## 2016-03-08 MED ORDER — TIOTROPIUM BROMIDE MONOHYDRATE 18 MCG IN CAPS
18.0000 ug | ORAL_CAPSULE | Freq: Every day | RESPIRATORY_TRACT | Status: DC
  Administered 2016-03-09: 18 ug via RESPIRATORY_TRACT
  Filled 2016-03-08: qty 5

## 2016-03-08 MED ORDER — LEVALBUTEROL HCL 0.63 MG/3ML IN NEBU
0.6300 mg | INHALATION_SOLUTION | Freq: Four times a day (QID) | RESPIRATORY_TRACT | Status: DC
  Administered 2016-03-08 – 2016-03-09 (×3): 0.63 mg via RESPIRATORY_TRACT
  Filled 2016-03-08 (×2): qty 3

## 2016-03-08 MED ORDER — LEVALBUTEROL HCL 0.63 MG/3ML IN NEBU
INHALATION_SOLUTION | RESPIRATORY_TRACT | Status: AC
  Filled 2016-03-08: qty 3

## 2016-03-08 MED ORDER — ALBUTEROL SULFATE (2.5 MG/3ML) 0.083% IN NEBU
2.5000 mg | INHALATION_SOLUTION | Freq: Four times a day (QID) | RESPIRATORY_TRACT | Status: DC
  Administered 2016-03-08 (×2): 2.5 mg via RESPIRATORY_TRACT
  Filled 2016-03-08 (×2): qty 3

## 2016-03-08 NOTE — Progress Notes (Signed)
Appreciate Dr. Luan Pulling expertise Solu-Medrol decreased to 40 mg IV currently nebulizer therapy continues. Outpatient workup for ILD as well as OSA planned. [name] [id number] DOB: 05-29-1951 DOA: 03/06/2016 PCP: Maricela Curet, MD   Physical Exam: Blood pressure 99/65, pulse 114, temperature 97.6 F (36.4 C), temperature source Oral, resp. rate 18, height 5\' 5"  (1.651 m), weight 91.51 kg (201 lb 11.9 oz), SpO2 93 %. Lungs show scattered rhonchi mild inspiratory and expiratory wheezes bilaterally no rales audible heart regular rhythm no S3 no S4 no heaves thrills rubs abdomen soft nontender bowel sounds normoactive   Investigations:  No results found for this or any previous visit (from the past 240 hour(s)).   Basic Metabolic Panel:  Recent Labs  03/07/16 0511 03/08/16 0556  NA 138 137  K 4.0 3.9  CL 95* 94*  CO2 32 33*  GLUCOSE 258* 177*  BUN 22* 32*  CREATININE 0.99 1.25*  CALCIUM 8.7* 8.6*   Liver Function Tests:  Recent Labs  03/06/16 2134  AST 19  ALT 13*  ALKPHOS 66  BILITOT 0.8  PROT 6.9  ALBUMIN 3.8     CBC:  Recent Labs  03/06/16 2134 03/07/16 0511  WBC 8.3 8.5  NEUTROABS 4.7  --   HGB 11.0* 10.9*  HCT 37.3 36.6  MCV 82.2 81.5  PLT 347 328    Dg Chest 2 View  03/06/2016  CLINICAL DATA:  Sob, general chest pain, non productive cough, LLE Swelling onset today. History of CHF, DM, HTN, COPD, stroke, cardiac cath, femoral popliteal bypass graft. EXAM: CHEST - 2 VIEW COMPARISON:  01/07/2016 FINDINGS: Mild bibasilar interstitial infiltrate edema, slightly improved since previous. There are small bilateral pleural effusions. Mild cardiomegaly. Atheromatous aortic arch. Visualized bones unremarkable. IMPRESSION: 1. Bibasilar interstitial edema, pleural effusions, and cardiomegaly suggesting CHF, marginally improved since previous exam Electronically Signed   By: Lucrezia Europe M.D.   On: 03/06/2016 22:42      Medications:   Impression:  Active  Problems:   Diabetes (HCC)   SVT (supraventricular tachycardia) (HCC)   S/P AKA (above knee amputation) unilateral (HCC)   Essential hypertension   CHF (congestive heart failure) ([place])   COPD with acute exacerbation (HCC)   COPD exacerbation (HCC)     Plan: Continue Brio long-acting laba Lama. Solu-Medrol decreased to 40 mg IV continue nebulizer therapy A she clinically improved  Consultants: Pulmonary   Procedures   Antibiotics: Rocephin and Zithromax         Time spent: 30   LOS: 2 days   [name] M   03/08/2016, 2:02 PM

## 2016-03-08 NOTE — Clinical Documentation Improvement (Signed)
Hospitalist  Can the diagnosis of CHF be further specified?    Acuity - Acute, Chronic, Acute on Chronic   Type - Systolic, Diastolic, Systolic and Diastolic  Other  Clinically Undetermined    Supporting Information: Suggested in CXR and noted in progress notes that previous ECHO had systolic at EF of [id number].  BNP at 653. Pulmonary workup in process.   Please exercise your independent, professional judgment when responding. A specific answer is not anticipated or expected.   Thank You,  [place] [phone]

## 2016-03-08 NOTE — Consult Note (Signed)
Consult requested by: Dr. Lorriane Shire Consult requested for respiratory failure/COPD:  HPI: This is a 65 year old who has a long known history of shortness of breath. She was diagnosed with COPD several years ago. She also has a history of heart failure. Her chest x-ray now shows what looks like interstitial infiltrates and I wonder if she has some element of chronic interstitial lung disease. She has not had pulmonary function testing at least in several years. She says that she has some symptoms of sleep apnea and had a sleep study several years ago but at that time apparently did not have sleep apnea but was started on nocturnal oxygen which she no longer uses. She stopped smoking about 3 months ago. She has a history of peripheral arterial disease and has had an amputation of her leg. She uses a nebulizer at home. She is short of breath with minimal activity.  Past Medical History  Diagnosis Date  . Hypertension   . COPD (chronic obstructive pulmonary disease) ([place])   . Stroke ([place])   . Chronic lower back pain   . Shingles Dec. 2016    Scalp  . Peripheral vascular disease ([place])   . GERD (gastroesophageal reflux disease)   . Hyperlipidemia   . Anxiety   . Type II diabetes mellitus ([place])   . Arthritis     "back" (10/30/2015)  . CHF (congestive heart failure) ([place])   . Hypoxia      Family History  Problem Relation Age of Onset  . Cancer Father   . Heart attack Sister   . Stroke Sister   . Hypertension Sister   . Hypertension Brother      Social History   Social History  . Marital Status: Married    Spouse Name: N/A  . Number of Children: N/A  . Years of Education: N/A   Social History Main Topics  . Smoking status: Former Smoker -- 0.12 packs/day for 49 years    Types: Cigarettes  . Smokeless tobacco: Never Used  . Alcohol Use: No  . Drug Use: No  . Sexual Activity: No   Other Topics Concern  . None   Social History Narrative     ROS: She denies chest pain. She  says her leg was quite swollen when she came to the hospital. She's not had any hemoptysis. No nausea vomiting or diarrhea. She's not got a productive cough    Objective: Vital signs in last 24 hours: Temp:  [97.6 F (36.4 C)-98.2 F (36.8 C)] 97.6 F (36.4 C) (06/23 0604) Pulse Rate:  [114-118] 114 (06/23 0604) Resp:  [18] 18 (06/23 0604) BP: (87-102)/(50-65) 99/65 mmHg (06/23 0604) SpO2:  [91 %-96 %] 91 % (06/23 0715) Weight change:  Last BM Date: 03/06/16  Intake/Output from previous day: 06/22 0701 - 06/23 0700 In: -  Out: 1350 [Urine:1350]  PHYSICAL EXAM She is awake and alert. She is still short of breath. She is wearing nasal oxygen. Her pupils are reactive, nose and throat are clear. Her neck is supple without masses. Chest shows rhonchi wheezing and rales bilaterally. Her heart is regular and I don't hear an S3 gallop at this point. Abdomen soft without masses. She has trace to 1+ edema of her remaining extremity and has some rubor. Central nervous system exam is grossly intact  Lab Results: Basic Metabolic Panel:  Recent Labs  03/07/16 0511 03/08/16 0556  NA 138 137  K 4.0 3.9  CL 95* 94*  CO2 32 33*  GLUCOSE 258* 177*  BUN 22* 32*  CREATININE 0.99 1.25*  CALCIUM 8.7* 8.6*   Liver Function Tests:  Recent Labs  03/06/16 2134  AST 19  ALT 13*  ALKPHOS 66  BILITOT 0.8  PROT 6.9  ALBUMIN 3.8   No results for input(s): LIPASE, AMYLASE in the last 72 hours. No results for input(s): AMMONIA in the last 72 hours. CBC:  Recent Labs  03/06/16 2134 03/07/16 0511  WBC 8.3 8.5  NEUTROABS 4.7  --   HGB 11.0* 10.9*  HCT 37.3 36.6  MCV 82.2 81.5  PLT 347 328   Cardiac Enzymes:  Recent Labs  03/06/16 2134  TROPONINI <0.03   BNP: No results for input(s): PROBNP in the last 72 hours. D-Dimer: No results for input(s): DDIMER in the last 72 hours. CBG:  Recent Labs  03/07/16 0310 03/07/16 0808 03/07/16 1113 03/07/16 1659 03/07/16 2114  03/08/16 0744  GLUCAP 236* 269* 344* 183* 197* 189*   Hemoglobin A1C: No results for input(s): HGBA1C in the last 72 hours. Fasting Lipid Panel: No results for input(s): CHOL, HDL, LDLCALC, TRIG, CHOLHDL, LDLDIRECT in the last 72 hours. Thyroid Function Tests: No results for input(s): TSH, T4TOTAL, FREET4, T3FREE, THYROIDAB in the last 72 hours. Anemia Panel: No results for input(s): VITAMINB12, FOLATE, FERRITIN, TIBC, IRON, RETICCTPCT in the last 72 hours. Coagulation: No results for input(s): LABPROT, INR in the last 72 hours. Urine Drug Screen: Drugs of Abuse  No results found for: LABOPIA, COCAINSCRNUR, LABBENZ, AMPHETMU, THCU, LABBARB  Alcohol Level: No results for input(s): ETH in the last 72 hours. Urinalysis: No results for input(s): COLORURINE, LABSPEC, PHURINE, GLUCOSEU, HGBUR, BILIRUBINUR, KETONESUR, PROTEINUR, UROBILINOGEN, NITRITE, LEUKOCYTESUR in the last 72 hours.  Invalid input(s): APPERANCEUR Misc. Labs:   ABGS:  Recent Labs  03/06/16 2200  PHART 7.436  PO2ART 75.5*  TCO2 14.0  HCO3 30.6*     MICROBIOLOGY: No results found for this or any previous visit (from the past 240 hour(s)).  Studies/Results: Dg Chest 2 View  03/06/2016  CLINICAL DATA:  Sob, general chest pain, non productive cough, LLE Swelling onset today. History of CHF, DM, HTN, COPD, stroke, cardiac cath, femoral popliteal bypass graft. EXAM: CHEST - 2 VIEW COMPARISON:  01/07/2016 FINDINGS: Mild bibasilar interstitial infiltrate edema, slightly improved since previous. There are small bilateral pleural effusions. Mild cardiomegaly. Atheromatous aortic arch. Visualized bones unremarkable. IMPRESSION: 1. Bibasilar interstitial edema, pleural effusions, and cardiomegaly suggesting CHF, marginally improved since previous exam Electronically Signed   By: Lucrezia Europe M.D.   On: 03/06/2016 22:42    Medications:  Prior to Admission:  Prescriptions prior to admission  Medication Sig Dispense Refill  Last Dose  . albuterol (PROVENTIL HFA;VENTOLIN HFA) 108 (90 Base) MCG/ACT inhaler Inhale 2 puffs into the lungs every 6 (six) hours as needed for wheezing or shortness of breath.   03/06/2016 at Unknown time  . ALPRAZolam (XANAX) 0.25 MG tablet Take 0.25 mg by mouth 4 (four) times daily as needed for anxiety.    03/06/2016 at Unknown time  . aspirin 325 MG tablet Take 1 tablet (325 mg total) by mouth daily. 30 tablet 0 03/06/2016 at Unknown time  . atorvastatin (LIPITOR) 20 MG tablet Take 1 tablet (20 mg total) by mouth daily at 6 PM. 30 tablet 0 03/06/2016 at Unknown time  . citalopram (CELEXA) 20 MG tablet Take 20 mg by mouth daily.   03/06/2016 at Unknown time  . furosemide (LASIX) 20 MG tablet Take 20 mg by mouth  daily.   03/06/2016 at Unknown time  . gabapentin (NEURONTIN) 300 MG capsule Take 300 mg by mouth 3 (three) times daily.   03/06/2016 at Unknown time  . insulin aspart (NOVOLOG) 100 UNIT/ML injection Inject 10 Units into the skin 3 (three) times daily before meals.    03/06/2016 at Unknown time  . Insulin Glargine (LANTUS SOLOSTAR) 100 UNIT/ML Solostar Pen Inject 30 Units into the skin at bedtime.    03/05/2016 at Unknown time  . ipratropium-albuterol (DUONEB) 0.5-2.5 (3) MG/3ML SOLN Take 3 mLs by nebulization every 8 (eight) hours.    03/06/2016 at Unknown time  . lisinopril (PRINIVIL,ZESTRIL) 20 MG tablet Take 1 tablet (20 mg total) by mouth daily. 30 tablet 0 03/06/2016 at Unknown time  . metFORMIN (GLUCOPHAGE) 500 MG tablet Take 500 mg by mouth 2 (two) times daily with a meal.   03/06/2016 at Unknown time  . metoprolol tartrate (LOPRESSOR) 25 MG tablet Take 25 mg by mouth 2 (two) times daily.    03/06/2016 at [place]  . naproxen (NAPROSYN) 500 MG tablet Take 500 mg by mouth 2 (two) times daily as needed for mild pain or moderate pain.    unknown  . oxyCODONE-acetaminophen (PERCOCET) 10-325 MG tablet Take 1 tablet by mouth 4 (four) times daily as needed for pain.    03/06/2016 at Unknown time  .  pantoprazole (PROTONIX) 40 MG tablet Take 40 mg by mouth daily.   03/06/2016 at Unknown time  . predniSONE (DELTASONE) 20 MG tablet Take 1 tablet (20 mg total) by mouth daily with breakfast. (Patient not taking: Reported on 03/06/2016) 14 tablet 0    Scheduled: . aspirin  325 mg Oral Daily  . atorvastatin  20 mg Oral q1800  . azithromycin  500 mg Intravenous Q24H  . cefTRIAXone (ROCEPHIN)  IV  2 g Intravenous Q24H  . citalopram  20 mg Oral Daily  . furosemide  40 mg Intravenous BID  . gabapentin  300 mg Oral TID  . heparin  5,000 Units Subcutaneous Q8H  . insulin aspart  0-20 Units Subcutaneous TID WC  . insulin aspart  0-5 Units Subcutaneous QHS  . insulin aspart  10 Units Subcutaneous TID AC  . insulin glargine  30 Units Subcutaneous QHS  . ipratropium-albuterol  3 mL Nebulization QID  . lisinopril  20 mg Oral Daily  . metFORMIN  500 mg Oral BID WC  . methylPREDNISolone (SOLU-MEDROL) injection  40 mg Intravenous Q6H  . metoprolol tartrate  25 mg Oral BID  . pantoprazole  40 mg Oral Daily  . sodium chloride flush  3 mL Intravenous Q12H   Continuous:  [id number], ondansetron **OR** ondansetron (ZOFRAN) IV, oxyCODONE-acetaminophen  Assesment: Her CT scan done in February of this year is suggested that she has some chronic interstitial lung disease in addition to her COPD. She has been using a nebulizer at home but she is not on any long-acting bronchodilators so I'm going to add that. She has stopped smoking so that should help. She will need to have pulmonary function testing as an outpatient. Repeat CT probably in about August. PFT should help to decide if she has interstitial lung disease. I think she needs another sleep study. She will likely require oxygen at home. Active Problems:   Diabetes (HCC)   SVT (supraventricular tachycardia) (H[facility])   S/P AKA (above knee amputation) unilateral (HCC)   Essential hypertension   CHF (congestive heart failure) ([facility])   COPD with acute  exacerbation (HCC)   COPD  exacerbation ([place])    Plan: As above    LOS: 2 days   Alger Kerstein L 03/08/2016, 9:03 AM

## 2016-03-08 NOTE — Progress Notes (Signed)
Inpatient Diabetes Program Recommendations  AACE/ADA: New Consensus Statement on Inpatient Glycemic Control (2015)  Target Ranges:  Prepandial:   less than 140 mg/dL      Peak postprandial:   less than 180 mg/dL (1-2 hours)      Critically ill patients:  140 - 180 mg/dL   Results for [name], [name] (MRN [id number]) as of 03/08/2016 13:16  Ref. Range 03/07/2016 08:08 03/07/2016 11:13 03/07/2016 16:59 03/07/2016 21:14  Glucose-Capillary Latest Ref Range: 65-99 mg/dL 269 (H) 344 (H) 183 (H) 197 (H)   Results for [name], [name] (MRN [id number]) as of 03/08/2016 13:16  Ref. Range 03/08/2016 07:44 03/08/2016 11:11  Glucose-Capillary Latest Ref Range: 65-99 mg/dL 189 (H) 253 (H)    Home DM Meds: Lantus 30 units QHS       Novolog 10 units tidwc       Metformin 500 mg bid     Current Insulin Orders: Lantus 30 units QHS                 Novolog 10 units tidwc      Novolog Resistant Correction Scale/ SSI (0-20 units) TID AC + HS         Metformin 500 mg bid    -Note that patient is currently getting IV Solumedrol 40 mg Q6 hours.  -Glucose levels elevated >180 mg/dl.     MD- Please consider the following in-hospital insulin adjustments while patient is getting IV steroids:  1. Increase Lantus to 35 units QHS  2. Increase Novolog Meal Coverage to 12 units tidwc      --Will follow patient during hospitalization--  Wyn Quaker RN, MSN, CDE Diabetes Coordinator Inpatient Glycemic Control Team Team Pager: [phone] (8a-5p)

## 2016-03-08 NOTE — Care Management Important Message (Signed)
Important Message  Patient Details  Name: [name] MRN: [id number] Date of Birth: 1951-06-07   Medicare Important Message Given:  Yes    Sherald Barge, RN 03/08/2016, 9:19 AM

## 2016-03-09 LAB — BASIC METABOLIC PANEL
Anion gap: 8 (ref 5–15)
BUN: 40 mg/dL — ABNORMAL HIGH (ref 6–20)
CHLORIDE: 97 mmol/L — AB (ref 101–111)
CO2: 34 mmol/L — AB (ref 22–32)
CREATININE: 1.27 mg/dL — AB (ref 0.44–1.00)
Calcium: 8.8 mg/dL — ABNORMAL LOW (ref 8.9–10.3)
GFR calc non Af Amer: 43 mL/min — ABNORMAL LOW (ref >60)
GFR, EST AFRICAN AMERICAN: 50 mL/min — AB (ref >60)
Glucose, Bld: 151 mg/dL — ABNORMAL HIGH (ref 65–99)
Potassium: 3.8 mmol/L (ref 3.5–5.1)
Sodium: 139 mmol/L (ref 135–145)

## 2016-03-09 LAB — GLUCOSE, CAPILLARY
GLUCOSE-CAPILLARY: 217 mg/dL — AB (ref 65–99)
Glucose-Capillary: 166 mg/dL — ABNORMAL HIGH (ref 65–99)

## 2016-03-09 MED ORDER — FLUTICASONE FUROATE-VILANTEROL 100-25 MCG/INH IN AEPB: 1.0000 | INHALATION_SPRAY | Freq: Every day | RESPIRATORY_TRACT | Status: AC

## 2016-03-09 MED ORDER — PREDNISONE 20 MG PO TABS: 20.0000 mg | ORAL_TABLET | Freq: Every day | ORAL | Status: DC

## 2016-03-09 NOTE — Discharge Summary (Signed)
Physician Discharge Summary  [name] [id number] DOB: Oct 04, 1950 DOA: 03/06/2016  PCP: Maricela Curet, MD  Admit date: 03/06/2016 Discharge date: 03/09/2016   Recommendations for Outpatient Follow-up:  The patient is advised to take all of her current prehospital admission medicines including insulin as prescribed. In addition she is to take prednisone 20 mg tablets 2 tablets by mouth daily for 7 days then decrease to one tablet by mouth daily for 7 days he is advised to follow-up my office on Tuesday 3 days from now to assess her respiratory status as well as glycemic control. She understands that steroids may increase her insulin requirements during the period she is taking them. She is likewise to additionally takes an inhaler called Breo ellipta puff every 24 hours Discharge Diagnoses:  Active Problems:   Diabetes (HCC)   SVT (supraventricular tachycardia) (HCC)   S/P AKA (above knee amputation) unilateral (HCC)   Essential hypertension   CHF (congestive heart failure) (HCC)   COPD with acute exacerbation (HCC)   COPD exacerbation [name])   Discharge Condition: Good and improving  Filed Weights   03/06/16 2126 03/07/16 0056 03/07/16 0557  Weight: 99.791 kg (220 lb) 91.672 kg (202 lb 1.6 oz) 91.51 kg (201 lb 11.9 oz)    History of present illness:  Patient has history of COPD quit smoking 3 months ago and I question of an AKA of her right lower extremity is currently undergoing rehabilitation with new prosthesis for the previous 3 weeks he likewise has insulin-dependent diabetes and history of coronary artery disease with normal systolic function EF of [id number] his mood with increasing dyspnea had chest x-ray showed some symptoms of body him overload however there was normal systolic function she was treated for a COPD exacerbation seen in consultation by pulmonary who agreed with the intravenous steroids as well as DuoNeb nebulizer he additionally gave her another inhaler  called Brio 1 puff every 24 hours) intravenous steroids were tapered and she was switched to oral steroids and continued to have no respiratory distress on the day of discharge consideration was given to partial component of obstructive sleep apnea and interstitial lung disease based on her chest x-ray Dr. Luan Pulling a pulmonologist for follow-up with her and to schedule outpatient CAT scans as well as sleep study to discern whether these 2 are confounding variables in her dyspnea  Hospital Course:  See history of present illness above  Procedures:    Consultations:  Pulmonary  Discharge Instructions  Discharge Instructions    Discharge instructions    Complete by:  As directed      Discharge patient    Complete by:  As directed             Medication List    TAKE these medications        albuterol 108 (90 Base) MCG/ACT inhaler  Commonly known as:  PROVENTIL HFA;VENTOLIN HFA  Inhale 2 puffs into the lungs every 6 (six) hours as needed for wheezing or shortness of breath.     ALPRAZolam 0.25 MG tablet  Commonly known as:  XANAX  Take 0.25 mg by mouth 4 (four) times daily as needed for anxiety.     aspirin 325 MG tablet  Take 1 tablet (325 mg total) by mouth daily.     atorvastatin 20 MG tablet  Commonly known as:  LIPITOR  Take 1 tablet (20 mg total) by mouth daily at 6 PM.     citalopram 20 MG tablet  Commonly  known as:  CELEXA  Take 20 mg by mouth daily.     fluticasone furoate-vilanterol 100-25 MCG/INH Aepb  Commonly known as:  BREO ELLIPTA  Inhale 1 puff into the lungs daily.     furosemide 20 MG tablet  Commonly known as:  LASIX  Take 20 mg by mouth daily.     gabapentin 300 MG capsule  Commonly known as:  NEURONTIN  Take 300 mg by mouth 3 (three) times daily.     insulin aspart 100 UNIT/ML injection  Commonly known as:  novoLOG  Inject 10 Units into the skin 3 (three) times daily before meals.     ipratropium-albuterol 0.5-2.5 (3) MG/3ML Soln    Commonly known as:  DUONEB  Take 3 mLs by nebulization every 8 (eight) hours.     LANTUS SOLOSTAR 100 UNIT/ML Solostar Pen  Generic drug:  Insulin Glargine  Inject 30 Units into the skin at bedtime.     lisinopril 20 MG tablet  Commonly known as:  PRINIVIL,ZESTRIL  Take 1 tablet (20 mg total) by mouth daily.     metFORMIN 500 MG tablet  Commonly known as:  GLUCOPHAGE  Take 500 mg by mouth 2 (two) times daily with a meal.     metoprolol tartrate 25 MG tablet  Commonly known as:  LOPRESSOR  Take 25 mg by mouth 2 (two) times daily.     naproxen 500 MG tablet  Commonly known as:  NAPROSYN  Take 500 mg by mouth 2 (two) times daily as needed for mild pain or moderate pain.     oxyCODONE-acetaminophen 10-325 MG tablet  Commonly known as:  PERCOCET  Take 1 tablet by mouth 4 (four) times daily as needed for pain.     pantoprazole 40 MG tablet  Commonly known as:  PROTONIX  Take 40 mg by mouth daily.     predniSONE 20 MG tablet  Commonly known as:  DELTASONE  Take 1 tablet (20 mg total) by mouth daily with breakfast. Take 2 tablets P.O. Daily for 7 days then decrease to 1 tab P.O. Daily for 7 days       Allergies  Allergen Reactions  . Codeine Rash  . Penicillins Other (See Comments)    Has patient had a PCN reaction causing immediate rash, facial/tongue/throat swelling, SOB or lightheadedness with hypotension: unknown Has patient had a PCN reaction causing severe rash involving mucus membranes or skin necrosis: unknown Has patient had a PCN reaction that required hospitalization unknown Has patient had a PCN reaction occurring within the last 10 years: unknown If all of the above answers are "NO", then may proceed with Cephalosporin use.  . Ciprofloxacin Hives      The results of significant diagnostics from this hospitalization (including imaging, microbiology, ancillary and laboratory) are listed below for reference.    Significant Diagnostic Studies: Dg Chest 2  View  03/06/2016  CLINICAL DATA:  Sob, general chest pain, non productive cough, LLE Swelling onset today. History of CHF, DM, HTN, COPD, stroke, cardiac cath, femoral popliteal bypass graft. EXAM: CHEST - 2 VIEW COMPARISON:  01/07/2016 FINDINGS: Mild bibasilar interstitial infiltrate edema, slightly improved since previous. There are small bilateral pleural effusions. Mild cardiomegaly. Atheromatous aortic arch. Visualized bones unremarkable. IMPRESSION: 1. Bibasilar interstitial edema, pleural effusions, and cardiomegaly suggesting CHF, marginally improved since previous exam Electronically Signed   By: Lucrezia Europe M.D.   On: 03/06/2016 22:42    Microbiology: No results found for this or any previous visit (from the  past 240 hour(s)).   Labs: Basic Metabolic Panel:  Recent Labs Lab 03/06/16 2134 03/07/16 0511 03/08/16 0556 03/09/16 0527  NA 137 138 137 139  K 3.6 4.0 3.9 3.8  CL 98* 95* 94* 97*  CO2 31 32 33* 34*  GLUCOSE 189* 258* 177* 151*  BUN 21* 22* 32* 40*  CREATININE 0.94 0.99 1.25* 1.27*  CALCIUM 8.8* 8.7* 8.6* 8.8*   Liver Function Tests:  Recent Labs Lab 03/06/16 2134  AST 19  ALT 13*  ALKPHOS 66  BILITOT 0.8  PROT 6.9  ALBUMIN 3.8   No results for input(s): LIPASE, AMYLASE in the last 168 hours. No results for input(s): AMMONIA in the last 168 hours. CBC:  Recent Labs Lab 03/06/16 2134 03/07/16 0511  WBC 8.3 8.5  NEUTROABS 4.7  --   HGB 11.0* 10.9*  HCT 37.3 36.6  MCV 82.2 81.5  PLT 347 328   Cardiac Enzymes:  Recent Labs Lab 03/06/16 2134  TROPONINI <0.03   BNP: BNP (last 3 results)  Recent Labs  11/03/15 1851 01/07/16 2235 03/06/16 2134  BNP 838.0* 527.0* 653.0*    ProBNP (last 3 results) No results for input(s): PROBNP in the last 8760 hours.  CBG:  Recent Labs Lab 03/08/16 0744 03/08/16 1111 03/08/16 1632 03/08/16 2050 03/09/16 0727  GLUCAP 189* 253* 215* 308* 166*       Signed:  Annjeanette Sarwar M  [facility] Pager: [phone] 03/09/2016, 11:23 AM

## 2016-03-09 NOTE — Progress Notes (Signed)
No home health services.  Patient to continue outpatient therapy.

## 2016-03-09 NOTE — Progress Notes (Signed)
**Note De-Identified Yasmeen Manka Obfuscation** O2 removed per Dr. Luan Pulling.  SAT 88% on RA.  RRT to continue to monitor.

## 2016-03-09 NOTE — Progress Notes (Signed)
Subjective: She says she feels much better. Her breathing is better. She has no new complaints. I think she probably is going to require oxygen at home and I'm going to see if we can qualify her for that.  Objective: Vital signs in last 24 hours: Temp:  [98.1 F (36.7 C)-98.3 F (36.8 C)] 98.1 F (36.7 C) (06/24 0604) Pulse Rate:  [102-124] 118 (06/24 0604) Resp:  [16-20] 16 (06/24 0604) BP: (104-145)/(53-108) 129/80 mmHg (06/24 0604) SpO2:  [88 %-97 %] 88 % (06/24 0943) Weight change:  Last BM Date: 03/07/16  Intake/Output from previous day: 06/23 0701 - 06/24 0700 In: 960 [P.O.:960] Out: 1700 [Urine:1700]  PHYSICAL EXAM General appearance: alert, cooperative and no distress Resp: Her chest is much clearer. She has somewhat diminished breath sounds and prolonged expiratory phase Cardio: Her heart is regular. I do not hear a gallop GI: soft, non-tender; bowel sounds normal; no masses,  no organomegaly Extremities: extremities normal, atraumatic, no cyanosis or edema  Lab Results:  Results for orders placed or performed during the hospital encounter of 03/06/16 (from the past 48 hour(s))  Glucose, capillary     Status: Abnormal   Collection Time: 03/07/16 11:13 AM  Result Value Ref Range   Glucose-Capillary 344 (H) 65 - 99 mg/dL   Comment 1 Notify RN   Glucose, capillary     Status: Abnormal   Collection Time: 03/07/16  4:59 PM  Result Value Ref Range   Glucose-Capillary 183 (H) 65 - 99 mg/dL   Comment 1 Notify RN   Glucose, capillary     Status: Abnormal   Collection Time: 03/07/16  9:14 PM  Result Value Ref Range   Glucose-Capillary 197 (H) 65 - 99 mg/dL   Comment 1 Notify RN    Comment 2 Document in Chart   Basic metabolic panel     Status: Abnormal   Collection Time: 03/08/16  5:56 AM  Result Value Ref Range   Sodium 137 135 - 145 mmol/L   Potassium 3.9 3.5 - 5.1 mmol/L   Chloride 94 (L) 101 - 111 mmol/L   CO2 33 (H) 22 - 32 mmol/L   Glucose, Bld 177 (H) 65 -  99 mg/dL   BUN 32 (H) 6 - 20 mg/dL   Creatinine, Ser 1.25 (H) 0.44 - 1.00 mg/dL   Calcium 8.6 (L) 8.9 - 10.3 mg/dL   GFR calc non Af Amer 44 (L) >60 mL/min   GFR calc Af Amer 51 (L) >60 mL/min    Comment: (NOTE) The eGFR has been calculated using the CKD EPI equation. This calculation has not been validated in all clinical situations. eGFR's persistently <60 mL/min signify possible Chronic Kidney Disease.    Anion gap 10 5 - 15  Glucose, capillary     Status: Abnormal   Collection Time: 03/08/16  7:44 AM  Result Value Ref Range   Glucose-Capillary 189 (H) 65 - 99 mg/dL  Glucose, capillary     Status: Abnormal   Collection Time: 03/08/16 11:11 AM  Result Value Ref Range   Glucose-Capillary 253 (H) 65 - 99 mg/dL  Glucose, capillary     Status: Abnormal   Collection Time: 03/08/16  4:32 PM  Result Value Ref Range   Glucose-Capillary 215 (H) 65 - 99 mg/dL   Comment 1 Notify RN    Comment 2 Document in Chart   Glucose, capillary     Status: Abnormal   Collection Time: 03/08/16  8:50 PM  Result Value Ref Range  Glucose-Capillary 308 (H) 65 - 99 mg/dL   Comment 1 Notify RN    Comment 2 Document in Chart   Basic metabolic panel     Status: Abnormal   Collection Time: 03/09/16  5:27 AM  Result Value Ref Range   Sodium 139 135 - 145 mmol/L   Potassium 3.8 3.5 - 5.1 mmol/L   Chloride 97 (L) 101 - 111 mmol/L   CO2 34 (H) 22 - 32 mmol/L   Glucose, Bld 151 (H) 65 - 99 mg/dL   BUN 40 (H) 6 - 20 mg/dL   Creatinine, Ser 1.27 (H) 0.44 - 1.00 mg/dL   Calcium 8.8 (L) 8.9 - 10.3 mg/dL   GFR calc non Af Amer 43 (L) >60 mL/min   GFR calc Af Amer 50 (L) >60 mL/min    Comment: (NOTE) The eGFR has been calculated using the CKD EPI equation. This calculation has not been validated in all clinical situations. eGFR's persistently <60 mL/min signify possible Chronic Kidney Disease.    Anion gap 8 5 - 15  Glucose, capillary     Status: Abnormal   Collection Time: 03/09/16  7:27 AM  Result  Value Ref Range   Glucose-Capillary 166 (H) 65 - 99 mg/dL    ABGS  Recent Labs  03/06/16 2200  PHART 7.436  PO2ART 75.5*  TCO2 14.0  HCO3 30.6*   CULTURES No results found for this or any previous visit (from the past 240 hour(s)). Studies/Results: No results found.  Medications:  Prior to Admission:  Prescriptions prior to admission  Medication Sig Dispense Refill Last Dose  . albuterol (PROVENTIL HFA;VENTOLIN HFA) 108 (90 Base) MCG/ACT inhaler Inhale 2 puffs into the lungs every 6 (six) hours as needed for wheezing or shortness of breath.   03/06/2016 at Unknown time  . ALPRAZolam (XANAX) 0.25 MG tablet Take 0.25 mg by mouth 4 (four) times daily as needed for anxiety.    03/06/2016 at Unknown time  . aspirin 325 MG tablet Take 1 tablet (325 mg total) by mouth daily. 30 tablet 0 03/06/2016 at Unknown time  . atorvastatin (LIPITOR) 20 MG tablet Take 1 tablet (20 mg total) by mouth daily at 6 PM. 30 tablet 0 03/06/2016 at Unknown time  . citalopram (CELEXA) 20 MG tablet Take 20 mg by mouth daily.   03/06/2016 at Unknown time  . furosemide (LASIX) 20 MG tablet Take 20 mg by mouth daily.   03/06/2016 at Unknown time  . gabapentin (NEURONTIN) 300 MG capsule Take 300 mg by mouth 3 (three) times daily.   03/06/2016 at Unknown time  . insulin aspart (NOVOLOG) 100 UNIT/ML injection Inject 10 Units into the skin 3 (three) times daily before meals.    03/06/2016 at Unknown time  . Insulin Glargine (LANTUS SOLOSTAR) 100 UNIT/ML Solostar Pen Inject 30 Units into the skin at bedtime.    03/05/2016 at Unknown time  . ipratropium-albuterol (DUONEB) 0.5-2.5 (3) MG/3ML SOLN Take 3 mLs by nebulization every 8 (eight) hours.    03/06/2016 at Unknown time  . lisinopril (PRINIVIL,ZESTRIL) 20 MG tablet Take 1 tablet (20 mg total) by mouth daily. 30 tablet 0 03/06/2016 at Unknown time  . metFORMIN (GLUCOPHAGE) 500 MG tablet Take 500 mg by mouth 2 (two) times daily with a meal.   03/06/2016 at Unknown time  .  metoprolol tartrate (LOPRESSOR) 25 MG tablet Take 25 mg by mouth 2 (two) times daily.    03/06/2016 at [place]  . naproxen (NAPROSYN) 500 MG tablet Take  500 mg by mouth 2 (two) times daily as needed for mild pain or moderate pain.    unknown  . oxyCODONE-acetaminophen (PERCOCET) 10-325 MG tablet Take 1 tablet by mouth 4 (four) times daily as needed for pain.    03/06/2016 at Unknown time  . pantoprazole (PROTONIX) 40 MG tablet Take 40 mg by mouth daily.   03/06/2016 at Unknown time  . predniSONE (DELTASONE) 20 MG tablet Take 1 tablet (20 mg total) by mouth daily with breakfast. (Patient not taking: Reported on 03/06/2016) 14 tablet 0    Scheduled: . aspirin  325 mg Oral Daily  . atorvastatin  20 mg Oral q1800  . azithromycin  500 mg Intravenous Q24H  . cefTRIAXone (ROCEPHIN)  IV  2 g Intravenous Q24H  . citalopram  20 mg Oral Daily  . fluticasone furoate-vilanterol  1 puff Inhalation Daily  . furosemide  40 mg Intravenous BID  . gabapentin  300 mg Oral TID  . heparin  5,000 Units Subcutaneous Q8H  . insulin aspart  0-20 Units Subcutaneous TID WC  . insulin aspart  0-5 Units Subcutaneous QHS  . insulin aspart  10 Units Subcutaneous TID AC  . insulin glargine  30 Units Subcutaneous QHS  . levalbuterol  0.63 mg Nebulization QID  . lisinopril  20 mg Oral Daily  . metFORMIN  500 mg Oral BID WC  . methylPREDNISolone (SOLU-MEDROL) injection  40 mg Intravenous Q6H  . metoprolol tartrate  25 mg Oral BID  . pantoprazole  40 mg Oral Daily  . sodium chloride flush  3 mL Intravenous Q12H  . tiotropium  18 mcg Inhalation Daily   Continuous:  [id number], ondansetron **OR** ondansetron (ZOFRAN) IV, oxyCODONE-acetaminophen  Assesment: I think she has a combination of COPD exacerbation congestive heart failure and probably some element of interstitial pulmonary fibrosis. She does seem markedly improved. CT scan is suggestive that she has cirrhosis as well which of course may give her more problem with  fluids. She is significantly improved. She says at home she had multiple bouts of hypoxia that kept her from being able to undergo her therapy for the amputation of her leg. I'm going to see if she can qualify for oxygen at home. Active Problems:   Diabetes (HCC)   SVT (supraventricular tachycardia) (HCC)   S/P AKA (above knee amputation) unilateral (HCC)   Essential hypertension   CHF (congestive heart failure) (HCC)   COPD with acute exacerbation (HCC)   COPD exacerbation ([place])    Plan: Continue treatments. As mentioned she will need pulmonary function testing and CT of the chest as an outpatient. See if she qualifies for oxygen.    LOS: 3 days   Fremon Zacharia L 03/09/2016, 10:17 AM

## 2016-03-11 ENCOUNTER — Telehealth (HOSPITAL_COMMUNITY): Payer: Self-pay

## 2016-03-11 ENCOUNTER — Ambulatory Visit (HOSPITAL_COMMUNITY): Payer: Medicare Other

## 2016-03-11 NOTE — Telephone Encounter (Signed)
03/11/16 husband called and cx - said she had somewhere else to go, I really couldn't understand what he said

## 2016-03-12 ENCOUNTER — Ambulatory Visit (HOSPITAL_COMMUNITY): Payer: Medicare Other | Admitting: Physical Therapy

## 2016-03-12 ENCOUNTER — Encounter (HOSPITAL_COMMUNITY): Payer: Medicare Other | Admitting: Physical Therapy

## 2016-03-12 ENCOUNTER — Telehealth (HOSPITAL_COMMUNITY): Payer: Self-pay | Admitting: Physical Therapy

## 2016-03-12 NOTE — Telephone Encounter (Signed)
Pt did not show for appointment.  Called and spoke to patient who reported she had another appointment and conflicted with her time here.  STates she went back to prosthetist who adjusted her prosthesis.  Pt was in hospitial 6/21-6/24

## 2016-03-14 ENCOUNTER — Ambulatory Visit (HOSPITAL_COMMUNITY): Payer: Medicare Other | Admitting: Physical Therapy

## 2016-03-20 ENCOUNTER — Ambulatory Visit (HOSPITAL_COMMUNITY): Payer: Medicare Other | Attending: Vascular Surgery

## 2016-03-20 DIAGNOSIS — Z9181 History of falling: Secondary | ICD-10-CM

## 2016-03-20 DIAGNOSIS — M6281 Muscle weakness (generalized): Secondary | ICD-10-CM | POA: Insufficient documentation

## 2016-03-20 DIAGNOSIS — R262 Difficulty in walking, not elsewhere classified: Secondary | ICD-10-CM | POA: Insufficient documentation

## 2016-03-20 DIAGNOSIS — R2681 Unsteadiness on feet: Secondary | ICD-10-CM | POA: Insufficient documentation

## 2016-03-20 NOTE — Therapy (Signed)
[facility] [place], [place], [address] Phone: [phone]   Fax:  [phone]  Physical Therapy Treatment  Patient Details  Name: [name] MRN: [id number] Date of Birth: Apr 23, 1951 Referring Provider: Dr. Ruta Glover   Encounter Date: 03/20/2016      PT End of Session - 03/20/16 1406    Visit Number 5   Number of Visits 25   Date for PT Re-Evaluation 03/28/16   Authorization Type [employer] medicare   Authorization - Visit Number 5   Authorization - Number of Visits 10   PT Start Time [id number]   PT Stop Time 1434   PT Time Calculation (min) 42 min   Equipment Utilized During Treatment Gait belt   Activity Tolerance Patient tolerated treatment well;Patient limited by fatigue   Behavior During Therapy [facility] for tasks assessed/performed      Past Medical History  Diagnosis Date  . Hypertension   . COPD (chronic obstructive pulmonary disease) ([place])   . Stroke ([place])   . Chronic lower back pain   . Shingles Dec. 2016    Scalp  . Peripheral vascular disease ([place])   . GERD (gastroesophageal reflux disease)   . Hyperlipidemia   . Anxiety   . Type II diabetes mellitus ([place])   . Arthritis     "back" (10/30/2015)  . CHF (congestive heart failure) ([place])   . Hypoxia     Past Surgical History  Procedure Laterality Date  . Posterior lumbar fusion    . Foot surgery Bilateral     "from standing on concrete all day"  . Peripheral vascular catheterization N/A 04/10/2015    Procedure: Abdominal Aortogram w/Lower Extremity;  Surgeon: Debra Lake Ka-Ho, MD;  Location: [facility];  Service: Cardiovascular;  Laterality: N/A;  . Thrombectomy femoral artery Right 04/10/2015    Procedure: THROMBECTOMY RIGHT TIBIAL  ARTERY WITH VEIN PATCH ANGIOPLASTY;  Surgeon: Debra Dutch, MD;  Location: [facility];  Service: Vascular;  Laterality: Right;  . Fasciotomy Right 04/10/2015    Procedure: FOUR COMPARTMENT FASCIOTOMY;  Surgeon: Debra Dutch, MD;  Location: [facility];  Service: Vascular;  Laterality: Right;  . Femoral-popliteal bypass graft Right 04/10/2015    Procedure: RIGHT  FEMORAL-BELOW THE KNEE POPLITEAL ARTERY BYPASS GRAFT USING 6 MM X 80 CM PROPATEN GRAFT;  Surgeon: Debra Dutch, MD;  Location: [place];  Service: Vascular;  Laterality: Right;  . Intraoperative arteriogram Right 04/10/2015    Procedure: INTRA OPERATIVE ARTERIOGRAM;  Surgeon: Debra Dutch, MD;  Location: [facility];  Service: Vascular;  Laterality: Right;  . Carotid endarterectomy Right 2010    Dr [name]   . Peripheral vascular catheterization N/A 10/20/2015    Procedure: Abdominal Aortogram;  Surgeon: Debra Dutch, MD;  Location: [place] [facility];  Service: Cardiovascular;  Laterality: N/A;  . Laparoscopic cholecystectomy    . Back surgery    . Carotid endarterectomy Left 03/2008    [name] 01/15/2011  . Shoulder open rotator cuff repair Right 02/2006    [name] 01/29/2011  . Cardiac catheterization    . 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 Amputation Right 10/30/2015    Procedure: RIGHT ABOVE KNEE AMPUTATION;  Surgeon: Debra Dutch, MD;  Location: [facility];  Service: Vascular;  Laterality: Right;    There were no vitals filed for this visit.      Subjective Assessment - 03/20/16 1404    Subjective Pt stated she was  in hospital for decreased oxygen last week, feels weaker following hospital.  Has began sleeping with O2 at night.  Pt brought prostesis and reports she had the limb height adjusted.  Reports wearing prothesis for 3 hours on Saturday and checked leg afterwards with no irritation noted.   Pertinent History [name] had a AKA on 10/30/2015 was discharged to SNF on 11/03/2015 but returned to the hospital the same day with SOB.  She remained in the hospitao until 11/09/2015 when again she was discharged to SNF; she again returned to the hospital on 2/27 with sepsis and stayed until 11/17/2015.    She was readmitted int o the hospital with  exacerbated COPD on 01/06/2106 she went home with her husband.  Hx includes past CVA, COPD , PVD , LBP , CHF and DM    Patient Stated Goals walk   Currently in Pain? No/denies              [facility] Adult PT Treatment/Exercise - 03/20/16 0001    Transfers   Transfers Stand Pivot Transfers   Stand Pivot Transfers 4: Min guard   Comments Cueing for safety   Ambulation/Gait   Ambulation Distance (Feet) 2 Feet  no prothesis   Assistive device Standard walker   Ambulation Surface Level   Gait velocity slow   Pre-Gait Activities Donning, increased edema to LE, unable to fit prostetic limb   Self-Care   Self-Care Other Self-Care Comments   Other Self-Care Comments  Don/Doff prosthesis   Manual Therapy   Manual Therapy Edema management   Manual therapy comments complete separate rest of treatment   Edema Management Retro massage supine position with LE elevated                  PT Short Term Goals - 03/05/16 1131    PT SHORT TERM GOAL #1   Title Pt to be independent in self massage techniques to decrease Rt residual limb swelling in order for prothesis to fit properly.   Time 2   Period Weeks   Status On-going   PT SHORT TERM GOAL #2   Title Pt to be independent in bilateral leg exercises to increase strength to be able to ambulate without prothesis for 50 feet to be able to walk to the restroom    Time 4   Period Weeks   Status On-going   PT SHORT TERM GOAL #3   Title Pt to be able to don and doff prothesis independently   Time 3   Period Weeks   Status On-going   PT SHORT TERM GOAL #4   Title Pt pain to be no greater than a 3/10 to be able to tolerate wearing her prothesis up to 3 hours at a time    Time 4   Period Weeks   Status On-going           PT Long Term Goals - 03/05/16 1132    PT LONG TERM GOAL #1   Title Pt right hip extension to increase 10 degrees to improve pt gait patttern    Time 6   Period Weeks   Status On-going   PT LONG TERM GOAL #2    Title Pt pain level to be no greater than a 1/10 to be able to tolerate prothesis being donned for up to six hours at a time.    Time 8   Period Weeks   Status On-going   PT LONG TERM GOAL #3  Title Pt to be able to stand with no upper extremity assist and reach outside of her base of support without losing her balance to decrease risk of falls   Time 8   Period Weeks   Status On-going   PT LONG TERM GOAL #4   Title Pt to be able to ambulate with walker and prothesis for over 200 feet to allow pt to walk with prothesis into restraurants/ church or movies.    Time 8   Period Weeks   Status On-going               Plan - 03/20/16 1824    Clinical Impression Statement Upon donning prothesis this session LE too swollen to fit in prothesis.  Session focus on improving transfers and manual technqiues to assist with edema control.  Able to reduce edema with increase ease donning sleeve.  Pt reports increased difficulty breathing supine, increased pillows and elevation assisted with SpO2 assessed through session and diaphragamatic breathing technqiues to improve O2 level with range of 84-96%.  No gait training this session due to increased difficutly breathing and edema.   Rehab Potential Good   PT Frequency 2x / week   PT Duration 12 weeks   PT Treatment/Interventions ADLs/Self Care Home Management;Gait training;Functional mobility training;Therapeutic activities;Therapeutic exercise;Balance training;Patient/family education;Prosthetic Training;Manual techniques   PT Next Visit Plan continue to work on weight shifting with prothesis and standing with no assistive device, manual for edema control PRN.        Patient will benefit from skilled therapeutic intervention in order to improve the following deficits and impairments:  Cardiopulmonary status limiting activity, Decreased activity tolerance, Decreased balance, Decreased mobility, Decreased range of motion, Decreased strength,  Increased edema, Difficulty walking  Visit Diagnosis: Difficulty in walking, not elsewhere classified  Muscle weakness (generalized)  Unsteadiness on feet  History of falling     Problem List Patient Active Problem List   Diagnosis Date Noted  . COPD exacerbation ([place]) 03/06/2016  . COPD with acute exacerbation ([place]) 01/08/2016  . Acute respiratory failure with hypoxia ([place]) 01/08/2016  . Sepsis ([place]) 11/14/2015  . Pressure ulcer 11/14/2015  . Hypoxia 11/03/2015  . CHF (congestive heart failure) ([place]) 11/03/2015  . S/P AKA (above knee amputation) unilateral ([place])   . Abnormality of gait   . Acute blood loss anemia   . Postoperative pain of extremity   . Phantom limb pain ([place])   . Essential hypertension   . Chronic obstructive pulmonary disease ([place])   . Chronic low back pain   . Poorly controlled type 2 diabetes mellitus with peripheral neuropathy ([place])   . PVD (peripheral vascular disease) ([place])   . Respiratory rate decreased   . Leukocytosis   . Atherosclerosis of right leg ([place]) 10/30/2015  . Pulmonary edema 08/18/2015  . CHF exacerbation ([place]) 08/18/2015  . SOB (shortness of breath) 08/18/2015  . Open wound of knee, leg (except thigh), and ankle 08/18/2015  . Abnormal echocardiogram   . SVT (supraventricular tachycardia) ([place])   . Lower limb ischemia 04/09/2015  . Critical lower limb ischemia 04/09/2015  . AKI (acute kidney injury) ([place]) 04/09/2015  . Hyperkalemia 04/09/2015  . Pancreatitis 12/26/2013  . Tachycardia 12/26/2013  . Dehydration 12/26/2013  . Vomiting 12/26/2013  . COPD (chronic obstructive pulmonary disease) ([place]) 12/26/2013  . Essential hypertension, benign 12/26/2013  . Diabetes ([place]) 12/26/2013  . Hypomagnesemia 12/26/2013   Ihor Austin, LPTA; [place]  Aldona Lento 03/20/2016, 6:32 PM  [place]  [facility] [place], [place], [address] Phone: [phone]   Fax:   [phone]  Name: [name] MRN: [id number] Date of Birth: [birth year]

## 2016-03-21 ENCOUNTER — Ambulatory Visit (HOSPITAL_COMMUNITY): Payer: Medicare Other

## 2016-03-21 ENCOUNTER — Telehealth (HOSPITAL_COMMUNITY): Payer: Self-pay

## 2016-03-21 NOTE — Telephone Encounter (Signed)
03/21/16 said she wouldn't be able to make it today

## 2016-03-25 ENCOUNTER — Ambulatory Visit (HOSPITAL_COMMUNITY): Payer: Medicare Other | Admitting: Physical Therapy

## 2016-03-25 DIAGNOSIS — R262 Difficulty in walking, not elsewhere classified: Secondary | ICD-10-CM

## 2016-03-25 DIAGNOSIS — Z9181 History of falling: Secondary | ICD-10-CM

## 2016-03-25 DIAGNOSIS — M6281 Muscle weakness (generalized): Secondary | ICD-10-CM

## 2016-03-25 DIAGNOSIS — R2681 Unsteadiness on feet: Secondary | ICD-10-CM

## 2016-03-25 NOTE — Therapy (Signed)
[facility] [place], [place], [address] Phone: [phone]   Fax:  [phone]  Physical Therapy Treatment (Re-Assessment)  Patient Details  Name: [name] MRN: [id number] Date of Birth: 07/20/1951 Referring Provider: Dr. Ruta Hinds   Encounter Date: 03/25/2016      PT End of Session - 03/25/16 1717    Visit Number 6   Number of Visits 25   Date for PT Re-Evaluation 04/22/16   Authorization Type UHC medicare   Authorization Time Period G-code done 6th session   Authorization - Visit Number 6   Authorization - Number of Visits 16   PT Start Time [id number]  patient arrived late, took a little while to finish at front desk    PT Stop Time 1430   PT Time Calculation (min) 33 min   Activity Tolerance Patient tolerated treatment well;Patient limited by fatigue;Other (comment)  limited by O2/HR values    Behavior During Therapy [name] for tasks assessed/performed      Past Medical History  Diagnosis Date  . Hypertension   . COPD (chronic obstructive pulmonary disease) ([place])   . Stroke ([place])   . Chronic lower back pain   . Shingles Dec. 2016    Scalp  . Peripheral vascular disease ([place])   . GERD (gastroesophageal reflux disease)   . Hyperlipidemia   . Anxiety   . Type II diabetes mellitus ([place])   . Arthritis     "back" (10/30/2015)  . CHF (congestive heart failure) ([place])   . Hypoxia     Past Surgical History  Procedure Laterality Date  . Posterior lumbar fusion    . Foot surgery Bilateral     "from standing on concrete all day"  . Peripheral vascular catheterization N/A 04/10/2015    Procedure: Abdominal Aortogram w/Lower Extremity;  Surgeon: Conrad [place], MD;  Location: [facility];  Service: Cardiovascular;  Laterality: N/A;  . Thrombectomy femoral artery Right 04/10/2015    Procedure: THROMBECTOMY RIGHT TIBIAL  ARTERY WITH VEIN PATCH ANGIOPLASTY;  Surgeon: Elam Dutch, MD;  Location: [facility];   Service: Vascular;  Laterality: Right;  . Fasciotomy Right 04/10/2015    Procedure: FOUR COMPARTMENT FASCIOTOMY;  Surgeon: Elam Dutch, MD;  Location: [facility];  Service: Vascular;  Laterality: Right;  . Femoral-popliteal bypass graft Right 04/10/2015    Procedure: RIGHT  FEMORAL-BELOW THE KNEE POPLITEAL ARTERY BYPASS GRAFT USING 6 MM X 80 CM PROPATEN GRAFT;  Surgeon: Elam Dutch, MD;  Location: [facility];  Service: Vascular;  Laterality: Right;  . Intraoperative arteriogram Right 04/10/2015    Procedure: INTRA OPERATIVE ARTERIOGRAM;  Surgeon: Elam Dutch, MD;  Location: [facility];  Service: Vascular;  Laterality: Right;  . Carotid endarterectomy Right 2010    Dr Kellie Simmering   . Peripheral vascular catheterization N/A 10/20/2015    Procedure: Abdominal Aortogram;  Surgeon: Elam Dutch, MD;  Location: [facility];  Service: Cardiovascular;  Laterality: N/A;  . Laparoscopic cholecystectomy    . Back surgery    . Carotid endarterectomy Left 03/2008    Archie Endo 01/15/2011  . Shoulder open rotator cuff repair Right 02/2006    Archie Endo 01/29/2011  . Cardiac catheterization    . 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 Amputation Right 10/30/2015    Procedure: RIGHT ABOVE KNEE AMPUTATION;  Surgeon: Elam Dutch, MD;  Location: [facility];  Service: Vascular;  Laterality: Right;    There were no  vitals filed for this visit.      Subjective Assessment - 03/25/16 1400    Subjective Patient reports that she is doing OK today; her residual limb is a little better than it was, she has been able to get her leg on better than she'd been able to in the past. She has had some intermittent swelling in her hand as well. She feels like her other intact leg is leaking a littlbe bit and she has put a cmpression wrap/bandage on it. No falls or close calls.    Pertinent History [name] had a AKA on 10/30/2015 was discharged to SNF on 11/03/2015 but returned to the [facility] the same day with SOB.  She remained  in the hospitao until 11/09/2015 when again she was discharged to SNF; she again returned to the [facility] on 2/27 with sepsis and stayed until 11/17/2015.    She was readmitted int o the [facility] with exacerbated COPD on 01/06/2106 she went home with her husband.  Hx includes past CVA, COPD , PVD , LBP , CHF and DM    Currently in Pain? No/denies            [facility] PT Assessment - 03/25/16 0001    Observation/Other Assessments   Observations stood 1 minute 45 seconds before seated rest   O2 87, HR 126 after on RA    Circumferential Edema   Circumferential - Right 51cm  circumferential R thigh    Strength   Right Hip Flexion 5/5   Right Hip ABduction 4+/5   Left Hip Flexion 4-/5   Left Hip ABduction 4/5   Left Knee Extension 5/5   Transfers   Stand Pivot Transfers 4: Min guard                             PT Education - 03/25/16 1717    Education provided Yes   Education Details progress, POC moving forward; consider talking to MD about supplemental O2 during the day due to frequent desaturation with simple/short tasks    Person(s) Educated Patient   Methods Explanation   Comprehension Verbalized understanding          PT Short Term Goals - 03/25/16 1424    PT SHORT TERM GOAL #1   Title Pt to be independent in self massage techniques to decrease Rt residual limb swelling in order for prothesis to fit properly.   Baseline 7/10- reports she has been doing this at home    Time 2   Period Weeks   Status On-going   PT SHORT TERM GOAL #2   Title Pt to be independent in bilateral leg exercises to increase strength to be able to ambulate without prothesis for 50 feet to be able to walk to the restroom    Time 4   Period Weeks   Status Achieved   PT SHORT TERM GOAL #3   Title Pt to be able to don and doff prothesis independently   Baseline 7/10- still needs help donning/doffing prosthesis    Time 3   Period Weeks   Status On-going   PT SHORT TERM GOAL #4    Title Pt pain to be no greater than a 3/10 to be able to tolerate wearing her prothesis up to 3 hours at a time    Baseline 7/10- reports no pain except for phantom pain at night; has not been wearing her prosthesis  consistently for 3 hours  yet    Time 4   Period Weeks   Status On-going           PT Long Term Goals - 2016/03/27 1426    PT LONG TERM GOAL #1   Title Pt right hip extension to increase 10 degrees to improve pt gait patttern    Time 6   Period Weeks   Status On-going   PT LONG TERM GOAL #2   Title Pt pain level to be no greater than a 1/10 to be able to tolerate prothesis being donned for up to six hours at a time.    Time 8   Period Weeks   Status On-going   PT LONG TERM GOAL #3   Title Pt to be able to stand with no upper extremity assist and reach outside of her base of support without losing her balance to decrease risk of falls   Time 8   Period Weeks   Status On-going   PT LONG TERM GOAL #4   Title Pt to be able to ambulate with walker and prothesis for over 200 feet to allow pt to walk with prothesis into restraurants/ church or movies.    Time 8   Period Weeks   Status On-going               Plan - 03-27-2016 1445    Clinical Impression Statement Re-assessment performed today. Patient has been limited with her progress in skilled PT services due to complications with her breathing, which severely limits her tolerance to activity at this time. O2 noted to be 87% on room air and HR 122BPM on room air at this time, but did re-saturate to WNL with pursed lip breathing; O2 did fall as low as 84% on RA but did consistently re-saturate with appropriate breathing measures. Residual limb appears intact however patient does continue to require assistance in donning/doffing her prosthetic. Patient severely deconditioned and was only able to maintain static standing with walker and without prosthetic for 1 minute, 45 seconds today. Will plan to further encourage  patient/spouse to contact MD regarding possible O2 supplementation due to frequent O2 desaturation. At this time recommend continuation of skilled PT services in order to address remaining functional limitations, reduce fall risk, and reach optimal level of function.    Rehab Potential Good   PT Frequency 2x / week   PT Duration 4 weeks   PT Treatment/Interventions ADLs/Self Care Home Management;Gait training;Functional mobility training;Therapeutic activities;Therapeutic exercise;Balance training;Patient/family education;Prosthetic Training;Manual techniques   PT Next Visit Plan Look into possible O2 supplementation. Continue to work on weight shifting with prothesis and standing with no assistive device, manual for edema control PRN.     PT Home Exercise Plan Given   Consulted and Agree with Plan of Care Patient      Patient will benefit from skilled therapeutic intervention in order to improve the following deficits and impairments:  Cardiopulmonary status limiting activity, Decreased activity tolerance, Decreased balance, Decreased mobility, Decreased range of motion, Decreased strength, Increased edema, Difficulty walking  Visit Diagnosis: Difficulty in walking, not elsewhere classified  Muscle weakness (generalized)  Unsteadiness on feet  History of falling       G-Codes - 2016/03/27 1719    Functional Assessment Tool Used clinical judgement; pt tolerance to ambulation    Functional Limitation Mobility: Walking and moving around   Mobility: Walking and Moving Around Current Status [id number]) At least 80 percent but less than 100 percent impaired, limited or restricted  Mobility: Walking and Moving Around Goal Status [phone]) At least 60 percent but less than 80 percent impaired, limited or restricted      Problem List Patient Active Problem List   Diagnosis Date Noted  . COPD exacerbation ([place]) 03/06/2016  . COPD with acute exacerbation ([place]) 01/08/2016  . Acute respiratory  failure with hypoxia ([place]) 01/08/2016  . Sepsis ([place]) 11/14/2015  . Pressure ulcer 11/14/2015  . Hypoxia 11/03/2015  . CHF (congestive heart failure) ([place]) 11/03/2015  . S/P AKA (above knee amputation) unilateral ([place])   . Abnormality of gait   . Acute blood loss anemia   . Postoperative pain of extremity   . Phantom limb pain ([place])   . Essential hypertension   . Chronic obstructive pulmonary disease ([place])   . Chronic low back pain   . Poorly controlled type 2 diabetes mellitus with peripheral neuropathy ([place])   . PVD (peripheral vascular disease) ([place])   . Respiratory rate decreased   . Leukocytosis   . Atherosclerosis of right leg ([place]) 10/30/2015  . Pulmonary edema 08/18/2015  . CHF exacerbation ([place]) 08/18/2015  . SOB (shortness of breath) 08/18/2015  . Open wound of knee, leg (except thigh), and ankle 08/18/2015  . Abnormal echocardiogram   . SVT (supraventricular tachycardia) ([place])   . Lower limb ischemia 04/09/2015  . Critical lower limb ischemia 04/09/2015  . AKI (acute kidney injury) ([place]) 04/09/2015  . Hyperkalemia 04/09/2015  . Pancreatitis 12/26/2013  . Tachycardia 12/26/2013  . Dehydration 12/26/2013  . Vomiting 12/26/2013  . COPD (chronic obstructive pulmonary disease) ([place]) 12/26/2013  . Essential hypertension, benign 12/26/2013  . Diabetes ([place]) 12/26/2013  . Hypomagnesemia 12/26/2013    Deniece Ree PT, DPT [facility] [address] , [place], [address] Phone: [phone]   Fax:  [phone]  Name: PRISCILLIA LANGOWSKI MRN: [id number] Date of Birth: October 17, 1950

## 2016-03-27 ENCOUNTER — Ambulatory Visit (HOSPITAL_COMMUNITY): Payer: Medicare Other | Admitting: Physical Therapy

## 2016-03-27 DIAGNOSIS — R2681 Unsteadiness on feet: Secondary | ICD-10-CM

## 2016-03-27 DIAGNOSIS — R262 Difficulty in walking, not elsewhere classified: Secondary | ICD-10-CM | POA: Diagnosis not present

## 2016-03-27 DIAGNOSIS — M6281 Muscle weakness (generalized): Secondary | ICD-10-CM

## 2016-03-27 DIAGNOSIS — Z9181 History of falling: Secondary | ICD-10-CM

## 2016-03-27 NOTE — Therapy (Signed)
[place], [place], [address] Phone: [phone]   Fax:  [phone]  Physical Therapy Treatment  Patient Details  Name: [name] MRN: [id number] Date of Birth: Nov 29, 1950 Referring Provider: Dr. Ruta Hinds   Encounter Date: 03/27/2016      PT End of Session - 03/27/16 1732    Visit Number 7   Number of Visits 25   Date for PT Re-Evaluation 04/22/16   Authorization Type UHC medicare   Authorization Time Period G-code done 6th session   Authorization - Visit Number 7   Authorization - Number of Visits 16   PT Start Time [id number]   PT Stop Time 1115   PT Time Calculation (min) 45 min   Activity Tolerance Patient tolerated treatment well;Patient limited by fatigue;Other (comment)  limited by O2/HR values    Behavior During Therapy [facility] for tasks assessed/performed      Past Medical History  Diagnosis Date  . Hypertension   . COPD (chronic obstructive pulmonary disease) ([place])   . Stroke ([place])   . Chronic lower back pain   . Shingles Dec. 2016    Scalp  . Peripheral vascular disease ([place])   . GERD (gastroesophageal reflux disease)   . Hyperlipidemia   . Anxiety   . Type II diabetes mellitus ([place])   . Arthritis     "back" (10/30/2015)  . CHF (congestive heart failure) ([place])   . Hypoxia     Past Surgical History  Procedure Laterality Date  . Posterior lumbar fusion    . Foot surgery Bilateral     "from standing on concrete all day"  . Peripheral vascular catheterization N/A 04/10/2015    Procedure: Abdominal Aortogram w/Lower Extremity;  Surgeon: Conrad [place], MD;  Location: [facility];  Service: Cardiovascular;  Laterality: N/A;  . Thrombectomy femoral artery Right 04/10/2015    Procedure: THROMBECTOMY RIGHT TIBIAL  ARTERY WITH VEIN PATCH ANGIOPLASTY;  Surgeon: Elam Dutch, MD;  Location: [facility];  Service: Vascular;  Laterality: Right;  . Fasciotomy Right 04/10/2015    Procedure: FOUR  COMPARTMENT FASCIOTOMY;  Surgeon: Elam Dutch, MD;  Location: [facility];  Service: Vascular;  Laterality: Right;  . Femoral-popliteal bypass graft Right 04/10/2015    Procedure: RIGHT  FEMORAL-BELOW THE KNEE POPLITEAL ARTERY BYPASS GRAFT USING 6 MM X 80 CM PROPATEN GRAFT;  Surgeon: Elam Dutch, MD;  Location: [facility];  Service: Vascular;  Laterality: Right;  . Intraoperative arteriogram Right 04/10/2015    Procedure: INTRA OPERATIVE ARTERIOGRAM;  Surgeon: Elam Dutch, MD;  Location: [facility];  Service: Vascular;  Laterality: Right;  . Carotid endarterectomy Right 2010    Dr Kellie Simmering   . Peripheral vascular catheterization N/A 10/20/2015    Procedure: Abdominal Aortogram;  Surgeon: Elam Dutch, MD;  Location: [facility];  Service: Cardiovascular;  Laterality: N/A;  . Laparoscopic cholecystectomy    . Back surgery    . Carotid endarterectomy Left 03/2008    Archie Endo 01/15/2011  . Shoulder open rotator cuff repair Right 02/2006    Archie Endo 01/29/2011  . Cardiac catheterization    . 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 Amputation Right 10/30/2015    Procedure: RIGHT ABOVE KNEE AMPUTATION;  Surgeon: Elam Dutch, MD;  Location: [facility];  Service: Vascular;  Laterality: Right;    There were no vitals filed for this visit.      Subjective Assessment - 03/27/16 1721  Subjective Pt states she just got her portable O2 yesterday.  States she is feeling OK but has had alot of swelling into her Lt UE.  Noted small area of fluid leakage Lt lateral inferior knee.                           [facility] Adult PT Treatment/Exercise - 03/27/16 0001    Bed Mobility   Bed Mobility Supine to Sit;Rolling Left;Rolling Right   Transfers   Five time sit to stand comments  --   Ambulation/Gait   Ambulation/Gait Yes   Ambulation Distance (Feet) 10 Feet   Assistive device Rolling walker   Gait Pattern Step-to pattern   Ambulation Surface Level   Gait velocity slow   Self-Care    Self-Care Other Self-Care Comments   Other Self-Care Comments  Don/Doff prosthesis   Lumbar Exercises: Supine   Bridge 10 reps   Bridge Limitations 2 sets with ball under Rt residual limb   Other Supine Lumbar Exercises PROM Bil hip flexors   Lumbar Exercises: Prone   Other Prone Lumbar Exercises prone lying 5 minutes                  PT Short Term Goals - 03/25/16 1424    PT SHORT TERM GOAL #1   Title Pt to be independent in self massage techniques to decrease Rt residual limb swelling in order for prothesis to fit properly.   Baseline 7/10- reports she has been doing this at home    Time 2   Period Weeks   Status On-going   PT SHORT TERM GOAL #2   Title Pt to be independent in bilateral leg exercises to increase strength to be able to ambulate without prothesis for 50 feet to be able to walk to the restroom    Time 4   Period Weeks   Status Achieved   PT SHORT TERM GOAL #3   Title Pt to be able to don and doff prothesis independently   Baseline 7/10- still needs help donning/doffing prosthesis    Time 3   Period Weeks   Status On-going   PT SHORT TERM GOAL #4   Title Pt pain to be no greater than a 3/10 to be able to tolerate wearing her prothesis up to 3 hours at a time    Baseline 7/10- reports no pain except for phantom pain at night; has not been wearing her prosthesis  consistently for 3 hours yet    Time 4   Period Weeks   Status On-going           PT Long Term Goals - 03/25/16 1426    PT LONG TERM GOAL #1   Title Pt right hip extension to increase 10 degrees to improve pt gait patttern    Time 6   Period Weeks   Status On-going   PT LONG TERM GOAL #2   Title Pt pain level to be no greater than a 1/10 to be able to tolerate prothesis being donned for up to six hours at a time.    Time 8   Period Weeks   Status On-going   PT LONG TERM GOAL #3   Title Pt to be able to stand with no upper extremity assist and reach outside of her base of support  without losing her balance to decrease risk of falls   Time 8   Period Weeks   Status On-going  PT LONG TERM GOAL #4   Title Pt to be able to ambulate with walker and prothesis for over 200 feet to allow pt to walk with prothesis into restraurants/ church or movies.    Time 8   Period Weeks   Status On-going               Plan - 03/27/16 1732    Clinical Impression Statement Pt with overall improvement with decreased edema in Lt UE and Bilateral LE's, noted reduction in induration of Rt residual limb as well.  Able to don prosthetic this session.  Pt requires max assist due to inability to reach/pull secondary to large pannis and weakness in hands.  Pt also gets out of breath easily and tends to hold breath when she strains.  Noted improvement with use of portable O2 this session with sats remaining above 88% on 2LO2.  Worked on bed mobiltiy and added prone lying due to hip flexor contractures.  Instructed patient to lay prone 2-3 times daily.  Able to complete ambulation with RW this session.  Pt required max assist for walker and LE placement and was very pleased with ability to complete this today.    Rehab Potential Good   PT Frequency 2x / week   PT Duration 4 weeks   PT Treatment/Interventions ADLs/Self Care Home Management;Gait training;Functional mobility training;Therapeutic activities;Therapeutic exercise;Balance training;Patient/family education;Prosthetic Training;Manual techniques   PT Next Visit Plan Continue with ambulation progression using RW, static balance with no assistive device and manual as needed  for edema control PRN.     PT Home Exercise Plan Given   Consulted and Agree with Plan of Care Patient      Patient will benefit from skilled therapeutic intervention in order to improve the following deficits and impairments:  Cardiopulmonary status limiting activity, Decreased activity tolerance, Decreased balance, Decreased mobility, Decreased range of motion,  Decreased strength, Increased edema, Difficulty walking  Visit Diagnosis: Difficulty in walking, not elsewhere classified  Muscle weakness (generalized)  Unsteadiness on feet  History of falling     Problem List Patient Active Problem List   Diagnosis Date Noted  . COPD exacerbation ([place]) 03/06/2016  . COPD with acute exacerbation ([place]) 01/08/2016  . Acute respiratory failure with hypoxia ([place]) 01/08/2016  . Sepsis ([place]) 11/14/2015  . Pressure ulcer 11/14/2015  . Hypoxia 11/03/2015  . CHF (congestive heart failure) ([place]) 11/03/2015  . S/P AKA (above knee amputation) unilateral ([place])   . Abnormality of gait   . Acute blood loss anemia   . Postoperative pain of extremity   . Phantom limb pain ([place])   . Essential hypertension   . Chronic obstructive pulmonary disease ([place])   . Chronic low back pain   . Poorly controlled type 2 diabetes mellitus with peripheral neuropathy ([place])   . PVD (peripheral vascular disease) ([place])   . Respiratory rate decreased   . Leukocytosis   . Atherosclerosis of right leg ([place]) 10/30/2015  . Pulmonary edema 08/18/2015  . CHF exacerbation ([place]) 08/18/2015  . SOB (shortness of breath) 08/18/2015  . Open wound of knee, leg (except thigh), and ankle 08/18/2015  . Abnormal echocardiogram   . SVT (supraventricular tachycardia) ([place])   . Lower limb ischemia 04/09/2015  . Critical lower limb ischemia 04/09/2015  . AKI (acute kidney injury) ([place]) 04/09/2015  . Hyperkalemia 04/09/2015  . Pancreatitis 12/26/2013  . Tachycardia 12/26/2013  . Dehydration 12/26/2013  . Vomiting 12/26/2013  . COPD (chronic obstructive pulmonary disease) ([place]) 12/26/2013  .  Essential hypertension, benign 12/26/2013  . Diabetes ([place]) 12/26/2013  . Hypomagnesemia 12/26/2013    Teena Irani, PTA/CLT [phone]  03/27/2016, 5:40 PM  [facility] [address] [place], [place], [address] Phone: [phone]   Fax:   [phone]  Name: [name] MRN: [id number] Date of Birth: February 24, 1951

## 2016-03-29 ENCOUNTER — Ambulatory Visit (HOSPITAL_COMMUNITY): Payer: Medicare Other

## 2016-03-29 DIAGNOSIS — M6281 Muscle weakness (generalized): Secondary | ICD-10-CM

## 2016-03-29 DIAGNOSIS — R2681 Unsteadiness on feet: Secondary | ICD-10-CM

## 2016-03-29 DIAGNOSIS — R262 Difficulty in walking, not elsewhere classified: Secondary | ICD-10-CM | POA: Diagnosis not present

## 2016-03-29 DIAGNOSIS — Z9181 History of falling: Secondary | ICD-10-CM

## 2016-03-29 NOTE — Therapy (Signed)
[name], [place], [address] Phone: [phone]   Fax:  [phone]  Physical Therapy Treatment  Patient Details  Name: [name] MRN: [id number] Date of Birth: 1950-09-29 Referring Provider: Dr. Ruta Hinds   Encounter Date: 03/29/2016      PT End of Session - 03/29/16 1018    Visit Number 8   Number of Visits 25   Date for PT Re-Evaluation 04/22/16   Authorization Type [employer] medicare   Authorization Time Period G-code done 6th session   Authorization - Visit Number 8   Authorization - Number of Visits 16   PT Start Time 0950   PT Stop Time 1040   PT Time Calculation (min) 50 min   Equipment Utilized During Treatment Gait belt   Activity Tolerance Patient limited by fatigue;Other (comment)  limited by SpO2 and HR; prothesis broke causing fall   Behavior During Therapy [name] for tasks assessed/performed      Past Medical History  Diagnosis Date  . Hypertension   . COPD (chronic obstructive pulmonary disease) ([place])   . Stroke ([place])   . Chronic lower back pain   . Shingles Dec. 2016    Scalp  . Peripheral vascular disease ([place])   . GERD (gastroesophageal reflux disease)   . Hyperlipidemia   . Anxiety   . Type II diabetes mellitus ([place])   . Arthritis     "back" (10/30/2015)  . CHF (congestive heart failure) ([place])   . Hypoxia     Past Surgical History  Procedure Laterality Date  . Posterior lumbar fusion    . Foot surgery Bilateral     "from standing on concrete all day"  . Peripheral vascular catheterization N/A 04/10/2015    Procedure: Abdominal Aortogram w/Lower Extremity;  Surgeon: Debra [place], MD;  Location: [facility];  Service: Cardiovascular;  Laterality: N/A;  . Thrombectomy femoral artery Right 04/10/2015    Procedure: THROMBECTOMY RIGHT TIBIAL  ARTERY WITH VEIN PATCH ANGIOPLASTY;  Surgeon: Debra Dutch, MD;  Location: [facility];  Service: Vascular;  Laterality: Right;  .  Fasciotomy Right 04/10/2015    Procedure: FOUR COMPARTMENT FASCIOTOMY;  Surgeon: Debra Dutch, MD;  Location: [facility];  Service: Vascular;  Laterality: Right;  . Femoral-popliteal bypass graft Right 04/10/2015    Procedure: RIGHT  FEMORAL-BELOW THE KNEE POPLITEAL ARTERY BYPASS GRAFT USING 6 MM X 80 CM PROPATEN GRAFT;  Surgeon: Debra Dutch, MD;  Location: [facility];  Service: Vascular;  Laterality: Right;  . Intraoperative arteriogram Right 04/10/2015    Procedure: INTRA OPERATIVE ARTERIOGRAM;  Surgeon: Debra Dutch, MD;  Location: [facility];  Service: Vascular;  Laterality: Right;  . Carotid endarterectomy Right 2010    Dr [name]   . Peripheral vascular catheterization N/A 10/20/2015    Procedure: Abdominal Aortogram;  Surgeon: Debra Dutch, MD;  Location: [facility];  Service: Cardiovascular;  Laterality: N/A;  . Laparoscopic cholecystectomy    . Back surgery    . Carotid endarterectomy Left 03/2008    [name] 01/15/2011  . Shoulder open rotator cuff repair Right 02/2006    [name] 01/29/2011  . Cardiac catheterization    . 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 Amputation Right 10/30/2015    Procedure: RIGHT ABOVE KNEE AMPUTATION;  Surgeon: Debra Dutch, MD;  Location: [facility];  Service: Vascular;  Laterality: Right;    There were no vitals filed for this visit.  Subjective Assessment - 03/29/16 0944    Subjective No reports of pain today.  reports she has been putting prothesis daily   Pertinent History [name] had a AKA on 10/30/2015 was discharged to SNF on 11/03/2015 but returned to the hospital the same day with SOB.  She remained in the hospitao until 11/09/2015 when again she was discharged to SNF; she again returned to the hospital on 2/27 with sepsis and stayed until 11/17/2015.    She was readmitted int o the hospital with exacerbated COPD on 01/06/2106 she went home with her husband.  Hx includes past CVA, COPD , PVD , LBP , CHF and DM    Patient Stated Goals walk    Currently in Pain? No/denies             [facility] Adult PT Treatment/Exercise - 03/29/16 0001    Transfers   Transfers Stand Pivot Transfers   Stand Pivot Transfers 4: Min guard   Ambulation/Gait   Ambulation/Gait Yes   Ambulation/Gait Assistance 4: Min assist   Ambulation Distance (Feet) 10 Feet   Gait Pattern Step-to pattern   Ambulation Surface Level   Gait velocity slow   Self-Care   Self-Care Other Self-Care Comments   Other Self-Care Comments  Don/Doff prosthesis            PT Short Term Goals - 03/25/16 1424    PT SHORT TERM GOAL #1   Title Pt to be independent in self massage techniques to decrease Rt residual limb swelling in order for prothesis to fit properly.   Baseline 7/10- reports she has been doing this at home    Time 2   Period Weeks   Status On-going   PT SHORT TERM GOAL #2   Title Pt to be independent in bilateral leg exercises to increase strength to be able to ambulate without prothesis for 50 feet to be able to walk to the restroom    Time 4   Period Weeks   Status Achieved   PT SHORT TERM GOAL #3   Title Pt to be able to don and doff prothesis independently   Baseline 7/10- still needs help donning/doffing prosthesis    Time 3   Period Weeks   Status On-going   PT SHORT TERM GOAL #4   Title Pt pain to be no greater than a 3/10 to be able to tolerate wearing her prothesis up to 3 hours at a time    Baseline 7/10- reports no pain except for phantom pain at night; has not been wearing her prosthesis  consistently for 3 hours yet    Time 4   Period Weeks   Status On-going           PT Long Term Goals - 03/25/16 1426    PT LONG TERM GOAL #1   Title Pt right hip extension to increase 10 degrees to improve pt gait patttern    Time 6   Period Weeks   Status On-going   PT LONG TERM GOAL #2   Title Pt pain level to be no greater than a 1/10 to be able to tolerate prothesis being donned for up to six hours at a time.    Time 8   Period  Weeks   Status On-going   PT LONG TERM GOAL #3   Title Pt to be able to stand with no upper extremity assist and reach outside of her base of support without losing her balance to decrease risk  of falls   Time 8   Period Weeks   Status On-going   PT LONG TERM GOAL #4   Title Pt to be able to ambulate with walker and prothesis for over 200 feet to allow pt to walk with prothesis into restraurants/ church or movies.    Time 8   Period Weeks   Status On-going               Plan - 03/29/16 1212    Clinical Impression Statement Session focus on improving independence with don/doffing prosthesis and encouraged to wear into dept.  Pt able to demonstrate safe mechanics with transfers with min guard though does require cueing for proper hand placement prior sit to stand for assistance and safety.  Pt demonstrated increased difficulty with breathing this session with prolonged rest breaks to increase SpO2%, vitals assessed through session.  Pt able to ambulate 10 feet prior fatigue.  Upon standing for 2nd set of gait training prothesis broke causing therapist to complete controlled descent to floor.  Upon return to [facility] pt stated she had soreness Lt hip, pain scale 6-7/10.  Husband informed of incident and encouraged to go to ER if pt continues.  Pt gave contact info for [employer] and encouraged to call about prothesis to schedule earlier apt.  Therapist called and spoke to [employer].  Spoke to pt following session on phone, pt stated she had been contacted by [employer] with apt scheduled, went to PCP office and does plan to go to ER if pain continue or gets worse.   Rehab Potential Good   PT Frequency 2x / week   PT Duration 4 weeks   PT Treatment/Interventions ADLs/Self Care Home Management;Gait training;Functional mobility training;Therapeutic activities;Therapeutic exercise;Balance training;Patient/family education;Prosthetic Training;Manual techniques   PT Next Visit Plan Continue with ambulation  progression using RW, static balance with no assistive device and manual as needed  for edema control PRN.  F/U on prothesis, Lt hip pain following fall and PCP/ER visit.      Patient will benefit from skilled therapeutic intervention in order to improve the following deficits and impairments:  Cardiopulmonary status limiting activity, Decreased activity tolerance, Decreased balance, Decreased mobility, Decreased range of motion, Decreased strength, Increased edema, Difficulty walking  Visit Diagnosis: Difficulty in walking, not elsewhere classified  Muscle weakness (generalized)  Unsteadiness on feet  History of falling     Problem List Patient Active Problem List   Diagnosis Date Noted  . COPD exacerbation ([place]) 03/06/2016  . COPD with acute exacerbation ([place]) 01/08/2016  . Acute respiratory failure with hypoxia ([place]) 01/08/2016  . Sepsis ([place]) 11/14/2015  . Pressure ulcer 11/14/2015  . Hypoxia 11/03/2015  . CHF (congestive heart failure) ([place]) 11/03/2015  . S/P AKA (above knee amputation) unilateral ([place])   . Abnormality of gait   . Acute blood loss anemia   . Postoperative pain of extremity   . Phantom limb pain ([place])   . Essential hypertension   . Chronic obstructive pulmonary disease ([place])   . Chronic low back pain   . Poorly controlled type 2 diabetes mellitus with peripheral neuropathy ([place])   . PVD (peripheral vascular disease) ([place])   . Respiratory rate decreased   . Leukocytosis   . Atherosclerosis of right leg ([place]) 10/30/2015  . Pulmonary edema 08/18/2015  . CHF exacerbation ([place]) 08/18/2015  . SOB (shortness of breath) 08/18/2015  . Open wound of knee, leg (except thigh), and ankle 08/18/2015  . Abnormal echocardiogram   . SVT (supraventricular  tachycardia) ([place])   . Lower limb ischemia 04/09/2015  . Critical lower limb ischemia 04/09/2015  . AKI (acute kidney injury) ([place]) 04/09/2015  . Hyperkalemia 04/09/2015  . Pancreatitis 12/26/2013  .  Tachycardia 12/26/2013  . Dehydration 12/26/2013  . Vomiting 12/26/2013  . COPD (chronic obstructive pulmonary disease) ([place]) 12/26/2013  . Essential hypertension, benign 12/26/2013  . Diabetes ([place]) 12/26/2013  . Hypomagnesemia 12/26/2013   Ihor Austin, [place]; [place]  Aldona Lento 03/29/2016, 4:48 PM  [facility] [address] [place], [place], [address] Phone: [phone]   Fax:  [phone]  Name: [name] MRN: [id number] Date of Birth: [birth year]

## 2016-04-01 ENCOUNTER — Telehealth (HOSPITAL_COMMUNITY): Payer: Self-pay

## 2016-04-01 ENCOUNTER — Ambulatory Visit (HOSPITAL_COMMUNITY): Payer: Medicare Other | Admitting: Physical Therapy

## 2016-04-01 NOTE — Telephone Encounter (Signed)
04/01/16 received a call to say that she wouldn't be in today

## 2016-04-03 ENCOUNTER — Telehealth (HOSPITAL_COMMUNITY): Payer: Self-pay

## 2016-04-03 ENCOUNTER — Ambulatory Visit (HOSPITAL_COMMUNITY): Payer: Medicare Other | Admitting: Physical Therapy

## 2016-04-03 NOTE — Telephone Encounter (Signed)
04/03/16 caller just said to cx - no reason given

## 2016-04-04 ENCOUNTER — Telehealth (HOSPITAL_COMMUNITY): Payer: Self-pay

## 2016-04-04 ENCOUNTER — Ambulatory Visit (HOSPITAL_COMMUNITY): Payer: Medicare Other

## 2016-04-04 NOTE — Telephone Encounter (Signed)
She is not feeling well after her fall on Sunday per her Husband.

## 2016-04-05 ENCOUNTER — Encounter (HOSPITAL_COMMUNITY): Payer: Medicare Other | Admitting: Physical Therapy

## 2016-04-08 ENCOUNTER — Emergency Department (HOSPITAL_COMMUNITY): Payer: Medicare Other

## 2016-04-08 ENCOUNTER — Inpatient Hospital Stay (HOSPITAL_COMMUNITY)
Admission: EM | Admit: 2016-04-08 | Discharge: 2016-04-20 | DRG: 246 | Disposition: A | Discharge status: Skilled Nursing Facility | Payer: Medicare Other | Attending: Internal Medicine | Admitting: Internal Medicine

## 2016-04-08 ENCOUNTER — Encounter (HOSPITAL_COMMUNITY): Payer: Self-pay | Admitting: Emergency Medicine

## 2016-04-08 DIAGNOSIS — Z89511 Acquired absence of right leg below knee: Secondary | ICD-10-CM

## 2016-04-08 DIAGNOSIS — R188 Other ascites: Secondary | ICD-10-CM | POA: Diagnosis present

## 2016-04-08 DIAGNOSIS — J9622 Acute and chronic respiratory failure with hypercapnia: Secondary | ICD-10-CM | POA: Diagnosis present

## 2016-04-08 DIAGNOSIS — IMO0002 Reserved for concepts with insufficient information to code with codable children: Secondary | ICD-10-CM | POA: Diagnosis present

## 2016-04-08 DIAGNOSIS — G8929 Other chronic pain: Secondary | ICD-10-CM | POA: Diagnosis present

## 2016-04-08 DIAGNOSIS — Z880 Allergy status to penicillin: Secondary | ICD-10-CM

## 2016-04-08 DIAGNOSIS — G4733 Obstructive sleep apnea (adult) (pediatric): Secondary | ICD-10-CM | POA: Diagnosis present

## 2016-04-08 DIAGNOSIS — R4182 Altered mental status, unspecified: Secondary | ICD-10-CM | POA: Diagnosis not present

## 2016-04-08 DIAGNOSIS — A419 Sepsis, unspecified organism: Secondary | ICD-10-CM | POA: Diagnosis not present

## 2016-04-08 DIAGNOSIS — Z885 Allergy status to narcotic agent status: Secondary | ICD-10-CM

## 2016-04-08 DIAGNOSIS — Z79899 Other long term (current) drug therapy: Secondary | ICD-10-CM

## 2016-04-08 DIAGNOSIS — J431 Panlobular emphysema: Secondary | ICD-10-CM | POA: Diagnosis not present

## 2016-04-08 DIAGNOSIS — R0902 Hypoxemia: Secondary | ICD-10-CM | POA: Diagnosis not present

## 2016-04-08 DIAGNOSIS — E662 Morbid (severe) obesity with alveolar hypoventilation: Secondary | ICD-10-CM | POA: Diagnosis not present

## 2016-04-08 DIAGNOSIS — I272 Other secondary pulmonary hypertension: Secondary | ICD-10-CM | POA: Diagnosis present

## 2016-04-08 DIAGNOSIS — D649 Anemia, unspecified: Secondary | ICD-10-CM | POA: Diagnosis not present

## 2016-04-08 DIAGNOSIS — Z89611 Acquired absence of right leg above knee: Secondary | ICD-10-CM

## 2016-04-08 DIAGNOSIS — R7989 Other specified abnormal findings of blood chemistry: Secondary | ICD-10-CM | POA: Diagnosis not present

## 2016-04-08 DIAGNOSIS — R609 Edema, unspecified: Secondary | ICD-10-CM

## 2016-04-08 DIAGNOSIS — R601 Generalized edema: Secondary | ICD-10-CM | POA: Diagnosis present

## 2016-04-08 DIAGNOSIS — J9620 Acute and chronic respiratory failure, unspecified whether with hypoxia or hypercapnia: Secondary | ICD-10-CM | POA: Diagnosis present

## 2016-04-08 DIAGNOSIS — E872 Acidosis, unspecified: Secondary | ICD-10-CM | POA: Diagnosis present

## 2016-04-08 DIAGNOSIS — R0602 Shortness of breath: Secondary | ICD-10-CM | POA: Diagnosis present

## 2016-04-08 DIAGNOSIS — Z7982 Long term (current) use of aspirin: Secondary | ICD-10-CM

## 2016-04-08 DIAGNOSIS — J9621 Acute and chronic respiratory failure with hypoxia: Secondary | ICD-10-CM | POA: Diagnosis present

## 2016-04-08 DIAGNOSIS — I25118 Atherosclerotic heart disease of native coronary artery with other forms of angina pectoris: Secondary | ICD-10-CM | POA: Diagnosis not present

## 2016-04-08 DIAGNOSIS — I4892 Unspecified atrial flutter: Secondary | ICD-10-CM | POA: Diagnosis present

## 2016-04-08 DIAGNOSIS — N179 Acute kidney failure, unspecified: Secondary | ICD-10-CM | POA: Diagnosis present

## 2016-04-08 DIAGNOSIS — I5033 Acute on chronic diastolic (congestive) heart failure: Secondary | ICD-10-CM | POA: Diagnosis present

## 2016-04-08 DIAGNOSIS — L03116 Cellulitis of left lower limb: Secondary | ICD-10-CM | POA: Diagnosis present

## 2016-04-08 DIAGNOSIS — E1151 Type 2 diabetes mellitus with diabetic peripheral angiopathy without gangrene: Secondary | ICD-10-CM | POA: Diagnosis present

## 2016-04-08 DIAGNOSIS — I130 Hypertensive heart and chronic kidney disease with heart failure and stage 1 through stage 4 chronic kidney disease, or unspecified chronic kidney disease: Secondary | ICD-10-CM | POA: Diagnosis present

## 2016-04-08 DIAGNOSIS — E1165 Type 2 diabetes mellitus with hyperglycemia: Secondary | ICD-10-CM | POA: Diagnosis present

## 2016-04-08 DIAGNOSIS — E785 Hyperlipidemia, unspecified: Secondary | ICD-10-CM | POA: Diagnosis present

## 2016-04-08 DIAGNOSIS — J811 Chronic pulmonary edema: Secondary | ICD-10-CM

## 2016-04-08 DIAGNOSIS — Z87891 Personal history of nicotine dependence: Secondary | ICD-10-CM

## 2016-04-08 DIAGNOSIS — I5031 Acute diastolic (congestive) heart failure: Secondary | ICD-10-CM | POA: Diagnosis present

## 2016-04-08 DIAGNOSIS — K761 Chronic passive congestion of liver: Secondary | ICD-10-CM | POA: Diagnosis present

## 2016-04-08 DIAGNOSIS — Z8249 Family history of ischemic heart disease and other diseases of the circulatory system: Secondary | ICD-10-CM

## 2016-04-08 DIAGNOSIS — I248 Other forms of acute ischemic heart disease: Secondary | ICD-10-CM | POA: Diagnosis present

## 2016-04-08 DIAGNOSIS — I959 Hypotension, unspecified: Secondary | ICD-10-CM | POA: Diagnosis present

## 2016-04-08 DIAGNOSIS — I471 Supraventricular tachycardia: Secondary | ICD-10-CM | POA: Diagnosis present

## 2016-04-08 DIAGNOSIS — J449 Chronic obstructive pulmonary disease, unspecified: Secondary | ICD-10-CM | POA: Diagnosis present

## 2016-04-08 DIAGNOSIS — J9602 Acute respiratory failure with hypercapnia: Secondary | ICD-10-CM | POA: Diagnosis not present

## 2016-04-08 DIAGNOSIS — K219 Gastro-esophageal reflux disease without esophagitis: Secondary | ICD-10-CM | POA: Diagnosis present

## 2016-04-08 DIAGNOSIS — I480 Paroxysmal atrial fibrillation: Secondary | ICD-10-CM | POA: Diagnosis not present

## 2016-04-08 DIAGNOSIS — W19XXXA Unspecified fall, initial encounter: Secondary | ICD-10-CM | POA: Diagnosis present

## 2016-04-08 DIAGNOSIS — F419 Anxiety disorder, unspecified: Secondary | ICD-10-CM | POA: Diagnosis present

## 2016-04-08 DIAGNOSIS — Z9981 Dependence on supplemental oxygen: Secondary | ICD-10-CM | POA: Diagnosis not present

## 2016-04-08 DIAGNOSIS — E118 Type 2 diabetes mellitus with unspecified complications: Secondary | ICD-10-CM

## 2016-04-08 DIAGNOSIS — I998 Other disorder of circulatory system: Secondary | ICD-10-CM | POA: Diagnosis present

## 2016-04-08 DIAGNOSIS — Z8673 Personal history of transient ischemic attack (TIA), and cerebral infarction without residual deficits: Secondary | ICD-10-CM | POA: Diagnosis not present

## 2016-04-08 DIAGNOSIS — R339 Retention of urine, unspecified: Secondary | ICD-10-CM | POA: Diagnosis not present

## 2016-04-08 DIAGNOSIS — J90 Pleural effusion, not elsewhere classified: Secondary | ICD-10-CM | POA: Diagnosis not present

## 2016-04-08 DIAGNOSIS — E1122 Type 2 diabetes mellitus with diabetic chronic kidney disease: Secondary | ICD-10-CM | POA: Diagnosis present

## 2016-04-08 DIAGNOSIS — Z9989 Dependence on other enabling machines and devices: Secondary | ICD-10-CM

## 2016-04-08 DIAGNOSIS — N189 Chronic kidney disease, unspecified: Secondary | ICD-10-CM

## 2016-04-08 DIAGNOSIS — Z981 Arthrodesis status: Secondary | ICD-10-CM

## 2016-04-08 DIAGNOSIS — I420 Dilated cardiomyopathy: Secondary | ICD-10-CM | POA: Diagnosis present

## 2016-04-08 DIAGNOSIS — I70229 Atherosclerosis of native arteries of extremities with rest pain, unspecified extremity: Secondary | ICD-10-CM

## 2016-04-08 DIAGNOSIS — Z794 Long term (current) use of insulin: Secondary | ICD-10-CM

## 2016-04-08 DIAGNOSIS — Z881 Allergy status to other antibiotic agents status: Secondary | ICD-10-CM

## 2016-04-08 DIAGNOSIS — I2510 Atherosclerotic heart disease of native coronary artery without angina pectoris: Secondary | ICD-10-CM | POA: Diagnosis present

## 2016-04-08 DIAGNOSIS — R509 Fever, unspecified: Secondary | ICD-10-CM

## 2016-04-08 DIAGNOSIS — J9601 Acute respiratory failure with hypoxia: Secondary | ICD-10-CM | POA: Diagnosis not present

## 2016-04-08 DIAGNOSIS — N183 Chronic kidney disease, stage 3 (moderate): Secondary | ICD-10-CM | POA: Diagnosis present

## 2016-04-08 LAB — URINE MICROSCOPIC-ADD ON

## 2016-04-08 LAB — I-STAT CHEM 8, ED
BUN: 32 mg/dL — ABNORMAL HIGH (ref 6–20)
CALCIUM ION: 1.13 mmol/L (ref 1.12–1.23)
Chloride: 98 mmol/L — ABNORMAL LOW (ref 101–111)
Creatinine, Ser: 2.2 mg/dL — ABNORMAL HIGH (ref 0.44–1.00)
GLUCOSE: 90 mg/dL (ref 65–99)
HCT: 41 % (ref 36.0–46.0)
HEMOGLOBIN: 13.9 g/dL (ref 12.0–15.0)
POTASSIUM: 4.4 mmol/L (ref 3.5–5.1)
SODIUM: 141 mmol/L (ref 135–145)
TCO2: 27 mmol/L (ref 0–100)

## 2016-04-08 LAB — TROPONIN I
TROPONIN I: 0.08 ng/mL — AB (ref <0.03)
Troponin I: 0.05 ng/mL (ref <0.03)

## 2016-04-08 LAB — CBC WITH DIFFERENTIAL/PLATELET
BASOS PCT: 0 %
Basophils Absolute: 0 10*3/uL (ref 0.0–0.1)
EOS ABS: 0.1 10*3/uL (ref 0.0–0.7)
EOS PCT: 1 %
HEMATOCRIT: 41.3 % (ref 36.0–46.0)
HEMOGLOBIN: 11.8 g/dL — AB (ref 12.0–15.0)
LYMPHS PCT: 26 %
Lymphs Abs: 2.8 10*3/uL (ref 0.7–4.0)
MCH: 24.4 pg — ABNORMAL LOW (ref 26.0–34.0)
MCHC: 28.6 g/dL — ABNORMAL LOW (ref 30.0–36.0)
MCV: 85.3 fL (ref 78.0–100.0)
MONOS PCT: 13 %
Monocytes Absolute: 1.4 10*3/uL — ABNORMAL HIGH (ref 0.1–1.0)
NEUTROS PCT: 60 %
Neutro Abs: 6.5 10*3/uL (ref 1.7–7.7)
Platelets: 229 10*3/uL (ref 150–400)
RBC: 4.84 MIL/uL (ref 3.87–5.11)
RDW: 19.1 % — AB (ref 11.5–15.5)
WBC: 10.8 10*3/uL — ABNORMAL HIGH (ref 4.0–10.5)

## 2016-04-08 LAB — COMPREHENSIVE METABOLIC PANEL
ALBUMIN: 3.9 g/dL (ref 3.5–5.0)
ALK PHOS: 77 U/L (ref 38–126)
ALT: 19 U/L (ref 14–54)
ANION GAP: 14 (ref 5–15)
AST: 34 U/L (ref 15–41)
BUN: 33 mg/dL — ABNORMAL HIGH (ref 6–20)
CALCIUM: 9 mg/dL (ref 8.9–10.3)
CO2: 27 mmol/L (ref 22–32)
Chloride: 99 mmol/L — ABNORMAL LOW (ref 101–111)
Creatinine, Ser: 2.37 mg/dL — ABNORMAL HIGH (ref 0.44–1.00)
GFR calc non Af Amer: 20 mL/min — ABNORMAL LOW (ref >60)
GFR, EST AFRICAN AMERICAN: 24 mL/min — AB (ref >60)
Glucose, Bld: 98 mg/dL (ref 65–99)
POTASSIUM: 4.7 mmol/L (ref 3.5–5.1)
SODIUM: 140 mmol/L (ref 135–145)
Total Bilirubin: 1.3 mg/dL — ABNORMAL HIGH (ref 0.3–1.2)
Total Protein: 6.7 g/dL (ref 6.5–8.1)

## 2016-04-08 LAB — BLOOD GAS, ARTERIAL
ACID-BASE DEFICIT: 0.8 mmol/L (ref 0.0–2.0)
Bicarbonate: 22.7 mEq/L (ref 20.0–24.0)
Drawn by: 234301
O2 Content: 4 L/min
O2 SAT: 95.9 %
PCO2 ART: 64 mmHg — AB (ref 35.0–45.0)
PO2 ART: 100 mmHg (ref 80.0–100.0)
pH, Arterial: 7.23 — ABNORMAL LOW (ref 7.350–7.450)

## 2016-04-08 LAB — I-STAT TROPONIN, ED: TROPONIN I, POC: 0.04 ng/mL (ref 0.00–0.08)

## 2016-04-08 LAB — URINALYSIS, ROUTINE W REFLEX MICROSCOPIC
Glucose, UA: NEGATIVE mg/dL
Hgb urine dipstick: NEGATIVE
LEUKOCYTES UA: NEGATIVE
NITRITE: NEGATIVE
PH: 5 (ref 5.0–8.0)
Protein, ur: 30 mg/dL — AB
Specific Gravity, Urine: 1.025 (ref 1.005–1.030)

## 2016-04-08 LAB — LACTIC ACID, PLASMA: Lactic Acid, Venous: 2.1 mmol/L (ref 0.5–1.9)

## 2016-04-08 LAB — GLUCOSE, CAPILLARY: Glucose-Capillary: 133 mg/dL — ABNORMAL HIGH (ref 65–99)

## 2016-04-08 LAB — MRSA PCR SCREENING: MRSA BY PCR: POSITIVE — AB

## 2016-04-08 LAB — PROTIME-INR
INR: 1.31 (ref 0.00–1.49)
Prothrombin Time: 16.4 seconds — ABNORMAL HIGH (ref 11.6–15.2)

## 2016-04-08 LAB — BRAIN NATRIURETIC PEPTIDE: B Natriuretic Peptide: 920 pg/mL — ABNORMAL HIGH (ref 0.0–100.0)

## 2016-04-08 LAB — CBG MONITORING, ED: Glucose-Capillary: 98 mg/dL (ref 65–99)

## 2016-04-08 LAB — I-STAT CG4 LACTIC ACID, ED: Lactic Acid, Venous: 7.62 mmol/L (ref 0.5–1.9)

## 2016-04-08 MED ORDER — CHLORHEXIDINE GLUCONATE 0.12 % MT SOLN
15.0000 mL | Freq: Two times a day (BID) | OROMUCOSAL | Status: DC
  Administered 2016-04-08 – 2016-04-10 (×4): 15 mL via OROMUCOSAL
  Filled 2016-04-08: qty 15

## 2016-04-08 MED ORDER — ATORVASTATIN CALCIUM 20 MG PO TABS
20.0000 mg | ORAL_TABLET | Freq: Every day | ORAL | Status: DC
  Administered 2016-04-09 – 2016-04-19 (×10): 20 mg via ORAL
  Filled 2016-04-08 (×8): qty 1

## 2016-04-08 MED ORDER — MUPIROCIN 2 % EX OINT
1.0000 "application " | TOPICAL_OINTMENT | Freq: Two times a day (BID) | CUTANEOUS | Status: AC
  Administered 2016-04-08 – 2016-04-13 (×10): 1 via NASAL
  Filled 2016-04-08 (×2): qty 22

## 2016-04-08 MED ORDER — FENTANYL CITRATE (PF) 100 MCG/2ML IJ SOLN
25.0000 ug | INTRAMUSCULAR | Status: DC | PRN
Start: 2016-04-08 — End: 2016-04-10
  Administered 2016-04-08 – 2016-04-10 (×5): 50 ug via INTRAVENOUS
  Filled 2016-04-08 (×5): qty 2

## 2016-04-08 MED ORDER — ALBUTEROL SULFATE (2.5 MG/3ML) 0.083% IN NEBU: 2.5000 mg | INHALATION_SOLUTION | RESPIRATORY_TRACT | Status: DC | PRN

## 2016-04-08 MED ORDER — HEPARIN (PORCINE) IN NACL 100-0.45 UNIT/ML-% IJ SOLN
1200.0000 [IU]/h | INTRAMUSCULAR | Status: DC
  Filled 2016-04-08 (×2): qty 250

## 2016-04-08 MED ORDER — CETYLPYRIDINIUM CHLORIDE 0.05 % MT LIQD
7.0000 mL | Freq: Two times a day (BID) | OROMUCOSAL | Status: DC
  Administered 2016-04-09 – 2016-04-19 (×21): 7 mL via OROMUCOSAL

## 2016-04-08 MED ORDER — IPRATROPIUM-ALBUTEROL 0.5-2.5 (3) MG/3ML IN SOLN
3.0000 mL | Freq: Four times a day (QID) | RESPIRATORY_TRACT | Status: DC
  Administered 2016-04-08 – 2016-04-11 (×11): 3 mL via RESPIRATORY_TRACT
  Filled 2016-04-08 (×11): qty 3

## 2016-04-08 MED ORDER — FUROSEMIDE 10 MG/ML IJ SOLN
40.0000 mg | Freq: Once | INTRAMUSCULAR | Status: AC
  Administered 2016-04-08: 40 mg via INTRAVENOUS
  Filled 2016-04-08: qty 4

## 2016-04-08 MED ORDER — IPRATROPIUM-ALBUTEROL 0.5-2.5 (3) MG/3ML IN SOLN
3.0000 mL | Freq: Once | RESPIRATORY_TRACT | Status: AC
  Administered 2016-04-08: 3 mL via RESPIRATORY_TRACT
  Filled 2016-04-08: qty 3

## 2016-04-08 MED ORDER — HEPARIN BOLUS VIA INFUSION
4000.0000 [IU] | Freq: Once | INTRAVENOUS | Status: AC
  Administered 2016-04-08: 4000 [IU] via INTRAVENOUS

## 2016-04-08 MED ORDER — PNEUMOCOCCAL VAC POLYVALENT 25 MCG/0.5ML IJ INJ
0.5000 mL | INJECTION | INTRAMUSCULAR | Status: DC
  Filled 2016-04-08: qty 0.5

## 2016-04-08 MED ORDER — VANCOMYCIN HCL IN DEXTROSE 1-5 GM/200ML-% IV SOLN
1000.0000 mg | Freq: Once | INTRAVENOUS | Status: DC
  Filled 2016-04-08: qty 200

## 2016-04-08 MED ORDER — PANTOPRAZOLE SODIUM 40 MG PO TBEC
40.0000 mg | DELAYED_RELEASE_TABLET | Freq: Every day | ORAL | Status: DC
  Administered 2016-04-10 – 2016-04-20 (×11): 40 mg via ORAL
  Filled 2016-04-08 (×11): qty 1

## 2016-04-08 MED ORDER — ASPIRIN 325 MG PO TABS
325.0000 mg | ORAL_TABLET | Freq: Every day | ORAL | Status: DC
  Administered 2016-04-08 – 2016-04-13 (×5): 325 mg via ORAL
  Filled 2016-04-08 (×5): qty 1

## 2016-04-08 MED ORDER — BUDESONIDE 0.5 MG/2ML IN SUSP
0.5000 mg | Freq: Two times a day (BID) | RESPIRATORY_TRACT | Status: DC
  Administered 2016-04-08 – 2016-04-20 (×23): 0.5 mg via RESPIRATORY_TRACT
  Filled 2016-04-08 (×25): qty 2

## 2016-04-08 MED ORDER — DEXTROSE 5 % IV SOLN
2.0000 g | Freq: Once | INTRAVENOUS | Status: DC
  Filled 2016-04-08: qty 2

## 2016-04-08 MED ORDER — SODIUM CHLORIDE 0.9 % IV SOLN
INTRAVENOUS | Status: DC
  Administered 2016-04-08: 1000 mL via INTRAVENOUS

## 2016-04-08 MED ORDER — SODIUM CHLORIDE 0.9 % IV SOLN: 250.0000 mL | INTRAVENOUS | Status: DC | PRN

## 2016-04-08 MED ORDER — CHLORHEXIDINE GLUCONATE CLOTH 2 % EX PADS
6.0000 | MEDICATED_PAD | Freq: Every day | CUTANEOUS | Status: AC
Start: 2016-04-09 — End: 2016-04-13
  Administered 2016-04-09 – 2016-04-13 (×5): 6 via TOPICAL

## 2016-04-08 MED ORDER — INSULIN ASPART 100 UNIT/ML ~~LOC~~ SOLN
0.0000 [IU] | SUBCUTANEOUS | Status: DC
  Administered 2016-04-08 – 2016-04-09 (×3): 2 [IU] via SUBCUTANEOUS
  Administered 2016-04-09: 3 [IU] via SUBCUTANEOUS
  Administered 2016-04-10 (×2): 5 [IU] via SUBCUTANEOUS
  Administered 2016-04-10 – 2016-04-11 (×5): 3 [IU] via SUBCUTANEOUS
  Administered 2016-04-12: 2 [IU] via SUBCUTANEOUS
  Administered 2016-04-12: 3 [IU] via SUBCUTANEOUS
  Administered 2016-04-12: 2 [IU] via SUBCUTANEOUS
  Administered 2016-04-12 – 2016-04-13 (×3): 3 [IU] via SUBCUTANEOUS

## 2016-04-08 NOTE — H&P (Signed)
PULMONARY / CRITICAL CARE MEDICINE   Name: [name] MRN: [id number] DOB: May 19, 1951    ADMISSION DATE:  04/08/2016 CONSULTATION DATE:  04/08/16  REFERRING MD:  [facility] - [facility] ED  CHIEF COMPLAINT:  SOB, AMS  HISTORY OF PRESENT ILLNESS:   [name] is a 65 y.o. 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  She presented to [facility] ED 7/24 with AMS, generalized edema, and SOB.  Symptoms started earlier that morning and got to the point where she was cyanotic in her face and lips.  She later developed nausea with 3 episodes of vomiting. Due to the degree of her swelling along with cyanosis, family decided to take her in for evaluation.  In ED, she was found to have mild hypercarbia (7.23 / 64 / 100), AoCKD, lactic acidosis, troponin bump.  She had CT of the head which was unremarkable and CT of the abdomen and pelvis which demonstrated anasarca with mild ascites and small bilateral pleural effusions.  She also had left foot that was cool to touch and had no pulses palpable or dopplerable.  She was subsequently transferred to [facility] for further evaluation and management.   PAST MEDICAL HISTORY :  She  has a past medical history of Anxiety; Arthritis; CHF (congestive heart failure) ([place]); Chronic lower back pain; COPD (chronic obstructive pulmonary disease) (HCC); GERD (gastroesophageal reflux disease); Hyperlipidemia; Hypertension; Hypoxia; Peripheral vascular disease ([place]); Shingles (Dec. 2016); Stroke [facility]); and Type II diabetes mellitus ([place]).  PAST SURGICAL HISTORY: She  has a past surgical history that includes Posterior lumbar fusion; Foot surgery (Bilateral); Cardiac catheterization (N/A, 04/10/2015); Thrombectomy femoral artery (Right, 04/10/2015); Fasciotomy (Right, 04/10/2015); Femoral-popliteal Bypass Graft (Right, 04/10/2015); Intraoperative arteriogram (Right, 04/10/2015); Carotid endarterectomy (Right, 2010); Cardiac catheterization (N/A, 10/20/2015); Laparoscopic  cholecystectomy; Back surgery; Carotid endarterectomy (Left, 03/2008); Shoulder open rotator cuff repair (Right, 02/2006); Cardiac catheterization; Leg amputation through knee (Right, 10/30/2015); and Amputation (Right, 10/30/2015).  Allergies  Allergen Reactions  . Codeine Rash  . Penicillins Other (See Comments)    Has patient had a PCN reaction causing immediate rash, facial/tongue/throat swelling, SOB or lightheadedness with hypotension: unknown Has patient had a PCN reaction causing severe rash involving mucus membranes or skin necrosis: unknown Has patient had a PCN reaction that required hospitalization unknown Has patient had a PCN reaction occurring within the last 10 years: unknown If all of the above answers are "NO", then may proceed with Cephalosporin use.  . Ciprofloxacin Hives    No current facility-administered medications on file prior to encounter.    Current Outpatient Prescriptions on File Prior to Encounter  Medication Sig  . ALPRAZolam (XANAX) 0.25 MG tablet Take 0.25 mg by mouth 2 (two) times daily.   Marland Kitchen atorvastatin (LIPITOR) 20 MG tablet Take 1 tablet (20 mg total) by mouth daily at 6 PM.  . citalopram (CELEXA) 20 MG tablet Take 20 mg by mouth daily.  . fluticasone furoate-vilanterol (BREO ELLIPTA) 100-25 MCG/INH AEPB Inhale 1 puff into the lungs daily.  . furosemide (LASIX) 20 MG tablet Take 20 mg by mouth daily.  Marland Kitchen gabapentin (NEURONTIN) 300 MG capsule Take 300 mg by mouth 2 (two) times daily.   Marland Kitchen ipratropium-albuterol (DUONEB) 0.5-2.5 (3) MG/3ML SOLN Take 3 mLs by nebulization every 6 (six) hours as needed (wheezing).   Marland Kitchen lisinopril (PRINIVIL,ZESTRIL) 20 MG tablet Take 1 tablet (20 mg total) by mouth daily.  . metFORMIN (GLUCOPHAGE) 500 MG tablet Take 500 mg by mouth 2 (two) times  daily with a meal.  . metoprolol tartrate (LOPRESSOR) 25 MG tablet Take 25 mg by mouth 2 (two) times daily.   . naproxen (NAPROSYN) 500 MG tablet Take by mouth 2 (two) times daily as  needed for mild pain or moderate pain.   Marland Kitchen oxyCODONE-acetaminophen (PERCOCET) 10-325 MG tablet Take 1 tablet by mouth 4 (four) times daily as needed for pain.   . pantoprazole (PROTONIX) 40 MG tablet Take 40 mg by mouth daily.  . predniSONE (DELTASONE) 20 MG tablet Take 1 tablet (20 mg total) by mouth daily with breakfast. Take 2 tablets P.O. Daily for 7 days then decrease to 1 tab P.O. Daily for 7 days  . albuterol (PROVENTIL HFA;VENTOLIN HFA) 108 (90 Base) MCG/ACT inhaler Inhale 2 puffs into the lungs every 6 (six) hours as needed for wheezing or shortness of breath.  Marland Kitchen aspirin 325 MG tablet Take 1 tablet (325 mg total) by mouth daily.  . insulin aspart (NOVOLOG) 100 UNIT/ML injection Inject 10 Units into the skin 3 (three) times daily before meals.   . Insulin Glargine (LANTUS SOLOSTAR) 100 UNIT/ML Solostar Pen Inject 30 Units into the skin at bedtime.     FAMILY HISTORY:  Her indicated that her mother is deceased. She indicated that her father is deceased. She indicated that the status of her brother is unknown.    SOCIAL HISTORY: She  reports that she has quit smoking. Her smoking use included Cigarettes. She has a 5.88 pack-year smoking history. She has never used smokeless tobacco. She reports that she does not drink alcohol or use drugs.  REVIEW OF SYSTEMS:   All negative; except for those that are bolded, which indicate positives.  Constitutional: weight loss, weight gain, night sweats, fevers, chills, fatigue, weakness.  HEENT: headaches, sore throat, sneezing, nasal congestion, post nasal drip, difficulty swallowing, tooth/dental problems, visual complaints, visual changes, ear aches. Neuro: difficulty with speech, weakness, numbness, ataxia. CV:  chest pain, orthopnea, PND, swelling in lower extremities, dizziness, palpitations, syncope.  Resp: cough, hemoptysis, dyspnea, wheezing. GI:  Abdominal pain, heartburn, indigestion, abdominal pain, nausea, vomiting, diarrhea,  constipation, change in bowel habits, loss of appetite, hematemesis, melena, hematochezia.  GU: dysuria, change in color of urine, urgency or frequency, flank pain, hematuria. MSK: joint pain or swelling, decreased range of motion. Psych: change in mood or affect, depression, anxiety, suicidal ideations, homicidal ideations. Skin: rash, itching, bruising.   SUBJECTIVE:  SOB much improved, currently none at rest.  Also no longer has N/V. Has been seen by vascular who was able to doppler PT and peroneal pulses in left foot.  VITAL SIGNS: BP 116/72 (BP Location: Left Arm)   Pulse (!) 101   Temp 98.1 F (36.7 C) (Oral)   Resp 12   Ht 5\' 3"  (1.6 m)   Wt 214 lb 15.2 oz (97.5 kg)   SpO2 93%   BMI 38.08 kg/m   HEMODYNAMICS:    VENTILATOR SETTINGS:    INTAKE / OUTPUT: No intake/output data recorded.   PHYSICAL EXAMINATION: General: Chronically ill appearing female, in NAD. Neuro: A&O x 3, non-focal.  HEENT: [place]/AT. PERRL, sclerae anicteric. Cardiovascular: RRR, no M/R/G. Left leg cool, PT and peroneal pulses dopplerable by vascular surgery. Lungs: Respirations even and unlabored.  Faint basilar crackles. Abdomen: Abdomen distended and tight.  BS x 4, non-tender. Musculoskeletal: R BKA.  LLE with 1+ non-pitting edema.  Skin: Intact, no rashes.  Left foot cool.  LABS:  BMET  Recent Labs Lab 04/08/16 1531 04/08/16 1608  NA 140 141  K 4.7 4.4  CL 99* 98*  CO2 27  --   BUN 33* 32*  CREATININE 2.37* 2.20*  GLUCOSE 98 90    Electrolytes  Recent Labs Lab 04/08/16 1531  CALCIUM 9.0    CBC  Recent Labs Lab 04/08/16 1530 04/08/16 1608  WBC 10.8*  --   HGB 11.8* 13.9  HCT 41.3 41.0  PLT 229  --     Coag's  Recent Labs Lab 04/08/16 1531  INR 1.31    Sepsis Markers  Recent Labs Lab 04/08/16 1614  LATICACIDVEN 7.62*    ABG  Recent Labs Lab 04/08/16 1605  PHART 7.230*  PCO2ART 64.0*  PO2ART 100.00    Liver Enzymes  Recent Labs Lab  04/08/16 1531  AST 34  ALT 19  ALKPHOS 77  BILITOT 1.3*  ALBUMIN 3.9    Cardiac Enzymes  Recent Labs Lab 04/08/16 1531  TROPONINI 0.05*    Glucose  Recent Labs Lab 04/08/16 1536  GLUCAP 98    Imaging Ct Abdomen Pelvis Wo Contrast  Result Date: 04/08/2016 CLINICAL DATA:  Altered mental status with generalized body edema and facial cyanosis. EXAM: CT ABDOMEN AND PELVIS WITHOUT CONTRAST TECHNIQUE: Multidetector CT imaging of the abdomen and pelvis was performed following the standard protocol without IV contrast. COMPARISON:  CT 12/20/2012.  Ultrasound 12/26/2013. FINDINGS: Lower chest: There are moderate size dependent pleural effusions, larger on the right. No significant pericardial effusion. There is atelectasis at both lung bases, worst in the right lower lobe. Hepatobiliary: The liver appears unremarkable as imaged in the noncontrast state. Previous cholecystectomy without evidence of significant biliary dilatation. Pancreas: The pancreas appears mildly atrophy, but unchanged. No focal surrounding inflammation. Spleen: Normal in size without focal abnormality. There is a stable small splenule. Adrenals/Urinary Tract: Both adrenal glands appear normal. Both kidneys appear unremarkable as imaged in the noncontrast state. No evidence of urinary tract calculus or hydronephrosis. The bladder is partially collapsed, likely accounting for mild prominence of its wall. Stomach/Bowel: No evidence of bowel wall thickening, distention or surrounding inflammatory change. Retrocecal surgical clips consistent with previous appendectomy. Vascular/Lymphatic: There are no enlarged abdominal or pelvic lymph nodes. Small retroperitoneal lymph nodes are not significantly enlarged. Stable aortic and branch vessel atherosclerosis. Reproductive: The uterus and ovaries appear stable. No evidence of adnexal mass. Other: Generalized anasarca with extensive subcutaneous edema. There is lesser mesenteric and  retroperitoneal edema. A small amount of ascites is present. Musculoskeletal: No acute or significant osseous findings. Postsurgical changes status post lower lumbar fusion. There are injection granulomas in the buttocks bilaterally. IMPRESSION: 1. Anasarca with generalized soft tissue edema and moderate size bilateral pleural effusions. Mild ascites. 2. No acute intra-abdominal findings identified. 3. Aortic atherosclerosis and postsurgical changes as described. Electronically Signed   By: Richardean Sale M.D.   On: 04/08/2016 18:00  Ct Head Wo Contrast  Result Date: 04/08/2016 CLINICAL DATA:  Altered mental status and cyanosis EXAM: CT HEAD WITHOUT CONTRAST TECHNIQUE: Contiguous axial images were obtained from the base of the skull through the vertex without intravenous contrast. COMPARISON:  04/11/2008 FINDINGS: Bony calvarium is intact. No findings to suggest acute hemorrhage, acute infarction or space-occupying mass lesion are noted. IMPRESSION: No acute abnormality noted. Electronically Signed   By: Inez Catalina M.D.   On: 04/08/2016 17:53  Dg Chest Portable 1 View  Result Date: 04/08/2016 CLINICAL DATA:  Altered mental status with generalized body edema. Periorbital swelling with facial cyanosis. EXAM: PORTABLE CHEST 1 VIEW COMPARISON:  03/06/2016 and 01/07/2016. FINDINGS: 1545 hours. Overall lower lung volumes. The heart is enlarged. There is chronic vascular congestion with lesser aeration of the lung bases. A small amount of pleural fluid remains likely. There is no pneumothorax or overt pulmonary edema. The bones appear unchanged status post distal right clavicle resection. Surgical clips are present in the lower neck. IMPRESSION: Cardiomegaly with chronic vascular congestion. There is lesser aeration of the lung bases with possible bilateral pleural effusions. Electronically Signed   By: Richardean Sale M.D.   On: 04/08/2016 16:02    STUDIES:  CT head 7/24 > no acute process. CT A / P 7/24  > anasarca with mild ascites and small bilateral pleural effusions.  CULTURES: None.  ANTIBIOTICS: None.  SIGNIFICANT EVENTS: 7/24 > admit.  Seen by vascular surgery for cold left leg, + PT and peroneal pulses > no emergent surgical intervention required.  LINES/TUBES: None.  DISCUSSION: 65 y.o. F with multiple medical issues, transferred to [facility] 7/24 due to cold LLE along with AoCKD.  Seen by vascular on arrival to [facility], PT and peroneal pulses present therefore no emergent surgery needed.  Started on heparin in the meantime.  ASSESSMENT / PLAN:  PULMONARY A: Acute hypoxic and hypercarbic respiratory failure. COPD by report - no PFT's in system. Probable OSA / OHS. P:   BiPAP PRN. Continue supplemental O2 as needed to maintain SpO2 > 92%. Budesonide in lieu of outpatient Breo. Albuterol / DuoNebs. Assess ambulatory desaturation study prior to discharge to assess for home O2 needs.  CARDIOVASCULAR A:  Cold LLE - seen by vascular surgery this evening, no emergent surgery needed. Troponin leak - suspect demand ischemia. Hx HTN, HLD, CHF (Echo from Dec 2016 with EF 50-55%). P:  Continue heparin gtt. Vascular surgery following, appreciate the assistance. Trend troponin, lactate. Continue outpatient ASA, atorvastatin. Hold outpatient furosemide, lisinopril, lopressor.Marland Kitchen  RENAL A:   AoCKD - on ACEi + NSAID's, also has had vomiting. P:   NS @ 75. Correct electrolytes as indicated. BMP in AM.  GASTROINTESTINAL A:   GERD. Ascites. Nutrition. P:   Continue outpatient PPI. NPO for now.  HEMATOLOGIC A:   VTE Prophylaxis. P:  Heparin gtt. CBC in AM.  INFECTIOUS A:   No indication of infection. P:   Assess PCT, if high then add empiric abx.  ENDOCRINE A:   DM.   P:   SSI. Hold outpatient metformin, novolog, lantus.  NEUROLOGIC A:   Hx anxiety, stroke. P:   Limit sedating meds. Low dose fentanyl PRN pain. Hold outpatient alprazolam, citalopram,  gabapentin, percocet..  Family updated: None available.  Interdisciplinary Family Meeting v Palliative Care Meeting:  Due by: 07/31.  CC time: 35 minutes.   Montey Hora, [place] [facility] Pager: [phone]  or [phone] 04/08/2016, 8:49 PM  Attending note: I have seen and examined the patient with nurse practitioner/resident and agree with the note. History, labs and imaging reviewed.  65 Y/O with rt AKA, DM, COPD, CAD. Came to ED for altered mental status, generalized edema, cyanosis. Transferred to [facility] for surgery eval of cold Lt leg. Noted to have AKI, LA of 7.6.  She has volume overload, anasarca, concern for sepsis with elevated LA No immediate need for surgery for cold lt leg as she has pulses.  - Continue gentle fluids, follow LA - Heparin drip - Abx coverage with vanco, cefepime - Follow troponins. Echocardiogram - Monitor urine output and  cr.  Rest of plan as above. Critical care time- 35 mins. This represents my independent time taking care of the patient.  Marshell Garfinkel MD [place] Pager [phone] If no answer or after 3pm call: [phone] 04/09/2016, 2:58 AM

## 2016-04-08 NOTE — Progress Notes (Signed)
CRITICAL VALUE ALERT  Critical value received: Troponin 0.08  Date of notification:  04/08/2016  Time of notification:  2236  Critical value read back: yes  Nurse who received alert: Gevena Barre, RN  MD notified (1st page):  Dr Lamonte Sakai  Time of first page:   MD notified (2nd page):  Time of second page:  Responding MD:  Dr Lamonte Sakai  Time MD responded: 2237

## 2016-04-08 NOTE — Consult Note (Signed)
Patient name: [name] MRN: [id number] DOB: December 25, 1950 Sex: female   Referred by: EDP  Reason for referral:  Chief Complaint  Patient presents with  . Edema    HISTORY OF PRESENT ILLNESS: The patient is a 65 year old female with multiple prior medical problems. Patient of Dr. Oneida Alar. Had multiple operations for attempted limb salvage in her right leg and eventual right above-knee amputation proximally 5 months ago. She presented to the outlying emergency room this this afternoon with severe shortness of breath overall cyanosis. Was also noted to have cyanosis of her left leg. Was complaining of left leg pain although she does have severe chronic back pain that causes pain in her left leg. She was transferred to [facility] for further evaluation. Her last formal arteriogram was in February 2017. At that time she had a patent superficial femoral artery with narrowing at the adductor canal. The popliteal artery was patent and there was a significant tibial disease. The anterior tibial was occluded near its origin, the peroneal artery became quite small above the ankle and the posterior tibial artery was small but patent into the foot. The patient denies any pain in her foot currently. She has had no motor or sensory difficulty. She reports that the pain in her leg is similar to her chronic pain related to her back. Of note she did fall while working with physical therapy 3 days ago and reports that she still has discomfort associated with this. She was extremely short of breath and cyanotic with a PCO2 of 64 at the outlying hospital. Her pH was 7.23.  Past Medical History:  Diagnosis Date  . Anxiety   . Arthritis    "back" (10/30/2015)  . CHF (congestive heart failure) ([place])   . Chronic lower back pain   . COPD (chronic obstructive pulmonary disease) ([place])   . GERD (gastroesophageal reflux disease)   . Hyperlipidemia   . Hypertension   . Hypoxia   . Peripheral vascular disease ([place])    . Shingles Dec. 2016   Scalp  . Stroke ([place])   . Type II diabetes mellitus ([place])     Past Surgical History:  Procedure Laterality Date  . AMPUTATION Right 10/30/2015   Procedure: RIGHT ABOVE KNEE AMPUTATION;  Surgeon: Elam Dutch, MD;  Location: [place];  Service: Vascular;  Laterality: Right;  . BACK SURGERY    . CARDIAC CATHETERIZATION    . CAROTID ENDARTERECTOMY Right 2010   Dr Kellie Simmering   . CAROTID ENDARTERECTOMY Left 03/2008   Archie Endo 01/15/2011  . FASCIOTOMY Right 04/10/2015   Procedure: FOUR COMPARTMENT FASCIOTOMY;  Surgeon: Elam Dutch, MD;  Location: [place];  Service: Vascular;  Laterality: Right;  . FEMORAL-POPLITEAL BYPASS GRAFT Right 04/10/2015   Procedure: RIGHT  FEMORAL-BELOW THE KNEE POPLITEAL ARTERY BYPASS GRAFT USING 6 MM X 80 CM PROPATEN GRAFT;  Surgeon: Elam Dutch, MD;  Location: [place];  Service: Vascular;  Laterality: Right;  . FOOT SURGERY Bilateral    "from standing on concrete all day"  . INTRAOPERATIVE ARTERIOGRAM Right 04/10/2015   Procedure: INTRA OPERATIVE ARTERIOGRAM;  Surgeon: Elam Dutch, MD;  Location: [place];  Service: Vascular;  Laterality: Right;  . LAPAROSCOPIC CHOLECYSTECTOMY    . LEG AMPUTATION THROUGH KNEE Right 10/30/2015  . PERIPHERAL VASCULAR CATHETERIZATION N/A 04/10/2015   Procedure: Abdominal Aortogram w/Lower Extremity;  Surgeon: Conrad Oakton, MD;  Location: [place] [facility];  Service: Cardiovascular;  Laterality: N/A;  . PERIPHERAL  VASCULAR CATHETERIZATION N/A 10/20/2015   Procedure: Abdominal Aortogram;  Surgeon: Elam Dutch, MD;  Location: [place] [facility];  Service: Cardiovascular;  Laterality: N/A;  . POSTERIOR LUMBAR FUSION    . SHOULDER OPEN ROTATOR CUFF REPAIR Right 02/2006   Archie Endo 01/29/2011  . THROMBECTOMY FEMORAL ARTERY Right 04/10/2015   Procedure: THROMBECTOMY RIGHT TIBIAL  ARTERY WITH VEIN PATCH ANGIOPLASTY;  Surgeon: Elam Dutch, MD;  Location: [place];  Service: Vascular;  Laterality: Right;    Social  History   Social History  . Marital status: Married    Spouse name: N/A  . Number of children: N/A  . Years of education: N/A   Occupational History  . Not on file.   Social History Main Topics  . Smoking status: Former Smoker    Packs/day: 0.12    Years: 49.00    Types: Cigarettes  . Smokeless tobacco: Never Used  . Alcohol use No  . Drug use: No  . Sexual activity: No   Other Topics Concern  . Not on file   Social History Narrative  . No narrative on file    Family History  Problem Relation Age of Onset  . Cancer Father   . Heart attack Sister   . Stroke Sister   . Hypertension Sister   . Hypertension Brother     Allergies as of 04/08/2016 - Review Complete 04/08/2016  Allergen Reaction Noted  . Codeine Rash 11/03/2015  . Penicillins Other (See Comments) 07/17/2012  . Ciprofloxacin Hives 04/09/2015    No current facility-administered medications on file prior to encounter.    Current Outpatient Prescriptions on File Prior to Encounter  Medication Sig Dispense Refill  . ALPRAZolam (XANAX) 0.25 MG tablet Take 0.25 mg by mouth 2 (two) times daily.     Marland Kitchen atorvastatin (LIPITOR) 20 MG tablet Take 1 tablet (20 mg total) by mouth daily at 6 PM. 30 tablet 0  . citalopram (CELEXA) 20 MG tablet Take 20 mg by mouth daily.    . fluticasone furoate-vilanterol (BREO ELLIPTA) 100-25 MCG/INH AEPB Inhale 1 puff into the lungs daily. 1 each 5  . furosemide (LASIX) 20 MG tablet Take 20 mg by mouth daily.    Marland Kitchen gabapentin (NEURONTIN) 300 MG capsule Take 300 mg by mouth 2 (two) times daily.     Marland Kitchen ipratropium-albuterol (DUONEB) 0.5-2.5 (3) MG/3ML SOLN Take 3 mLs by nebulization every 6 (six) hours as needed (wheezing).     Marland Kitchen lisinopril (PRINIVIL,ZESTRIL) 20 MG tablet Take 1 tablet (20 mg total) by mouth daily. 30 tablet 0  . metFORMIN (GLUCOPHAGE) 500 MG tablet Take 500 mg by mouth 2 (two) times daily with a meal.    . metoprolol tartrate (LOPRESSOR) 25 MG tablet Take 25 mg by  mouth 2 (two) times daily.     . naproxen (NAPROSYN) 500 MG tablet Take by mouth 2 (two) times daily as needed for mild pain or moderate pain.     Marland Kitchen oxyCODONE-acetaminophen (PERCOCET) 10-325 MG tablet Take 1 tablet by mouth 4 (four) times daily as needed for pain.     . pantoprazole (PROTONIX) 40 MG tablet Take 40 mg by mouth daily.    . predniSONE (DELTASONE) 20 MG tablet Take 1 tablet (20 mg total) by mouth daily with breakfast. Take 2 tablets P.O. Daily for 7 days then decrease to 1 tab P.O. Daily for 7 days 21 tablet 1  . albuterol (PROVENTIL HFA;VENTOLIN HFA) 108 (90 Base) MCG/ACT inhaler Inhale 2 puffs into  the lungs every 6 (six) hours as needed for wheezing or shortness of breath.    Marland Kitchen aspirin 325 MG tablet Take 1 tablet (325 mg total) by mouth daily. 30 tablet 0  . insulin aspart (NOVOLOG) 100 UNIT/ML injection Inject 10 Units into the skin 3 (three) times daily before meals.     . Insulin Glargine (LANTUS SOLOSTAR) 100 UNIT/ML Solostar Pen Inject 30 Units into the skin at bedtime.        REVIEW OF SYSTEMS:  Positives indicated with an "X"  CARDIOVASCULAR:  [ ]  chest pain   [ ]  chest pressure   [ ]  palpitations   [ ]  orthopnea   [ ]  dyspnea on exertion   [ ]  claudication   [ ]  rest pain   [ ]  DVT   [ ]  phlebitis PULMONARY:   [ ]  productive cough   [ ]  asthma   [ ]  wheezing NEUROLOGIC:   [ ]  weakness  [ ]  paresthesias  [ ]  aphasia  [ ]  amaurosis  [ ]  dizziness HEMATOLOGIC:   [ ]  bleeding problems   [ ]  clotting disorders MUSCULOSKELETAL:  [ ]  joint pain   [ ]  joint swelling GASTROINTESTINAL: [ ]   blood in stool  [ ]   hematemesis GENITOURINARY:  [ ]   dysuria  [ ]   hematuria PSYCHIATRIC:  [ ]  history of major depression INTEGUMENTARY:  [ ]  rashes  [ ]  ulcers CONSTITUTIONAL:  [ ]  fever   [ ]  chills  PHYSICAL EXAMINATION:  General: The patient is a well-nourished female, in no acute distress. Vital signs are BP 116/72 (BP Location: Left Arm)   Pulse (!) 101   Temp 98.1 F (36.7  C) (Oral)   Resp 12   Ht 5\' 3"  (1.6 m)   Wt 214 lb 15.2 oz (97.5 kg)   SpO2 93%   BMI 38.08 kg/m  Pulmonary: There is a good air exchange  abd: Diffuse edema with no soreness Musculoskeletal: Well-healed right above-knee amputation. No lesions on her left leg. Neurologic: No focal weakness or paresthesias are detected, motor and sensory completely normal and her left foot Skin: There are no ulcer or rashes noted. Psychiatric: The patient has normal affect. Cardiovascular: Absent pedal pulses on the left. She does have monophasic Doppler flow in the peroneal artery above her ankle and posterior tibial just above her medial malleolus     Impression and Plan:  Patient with multiple medical problems admitted with worsening edema and worsening COPD and cyanosis. Currently alert and comfortable. Denies any left leg discomfort other than her chronic pain. Motor and sensory function intact with no evidence of critical limb ischemia. Will follow along with you. Will let Dr. Oneida Alar no of her admission in the morning    Early, [facility] Office: [phone]

## 2016-04-08 NOTE — ED Notes (Signed)
CRITICAL VALUE ALERT  Critical value received:  ABG ph 7.23, pco2 64.0, po2 100, sat 95.9, bicarb 22.7  Date of notification:  04/08/16  Time of notification:  1630  Critical value read back: yes  Nurse who received alert:  Ilda Mori  MD notified (1st page):  rancour  Time of first page: 1632  MD notified (2nd page):  Time of second page:  Responding MD:  rancour  Time MD responded:  [phone]

## 2016-04-08 NOTE — Progress Notes (Signed)
ANTIBIOTIC CONSULT NOTE-Preliminary  Pharmacy Consult for CEFEPIME Indication: sepsis  Allergies  Allergen Reactions  . Codeine Rash  . Penicillins Other (See Comments)    Has patient had a PCN reaction causing immediate rash, facial/tongue/throat swelling, SOB or lightheadedness with hypotension: unknown Has patient had a PCN reaction causing severe rash involving mucus membranes or skin necrosis: unknown Has patient had a PCN reaction that required hospitalization unknown Has patient had a PCN reaction occurring within the last 10 years: unknown If all of the above answers are "NO", then may proceed with Cephalosporin use.  . Ciprofloxacin Hives   Patient Measurements: Height: 5\' 3"  (160 cm) Weight: 200 lb (90.7 kg) IBW/kg (Calculated) : 52.4  Vital Signs: BP: 102/73 (07/24 1530) Pulse Rate: 102 (07/24 1615)  Labs:  Recent Labs  04/08/16 1530 04/08/16 1531 04/08/16 1608  WBC 10.8*  --   --   HGB 11.8*  --  13.9  PLT 229  --   --   CREATININE  --  2.37* 2.20*   Estimated Creatinine Clearance: 27.2 mL/min (by C-G formula based on SCr of 2.2 mg/dL).  No results for input(s): VANCOTROUGH, VANCOPEAK, VANCORANDOM, GENTTROUGH, GENTPEAK, GENTRANDOM, TOBRATROUGH, TOBRAPEAK, TOBRARND, AMIKACINPEAK, AMIKACINTROU, AMIKACIN in the last 72 hours.   Microbiology: No results found for this or any previous visit (from the past 720 hour(s)).  Medical History: Past Medical History:  Diagnosis Date  . Anxiety   . Arthritis    "back" (10/30/2015)  . CHF (congestive heart failure) ([place])   . Chronic lower back pain   . COPD (chronic obstructive pulmonary disease) ([place])   . GERD (gastroesophageal reflux disease)   . Hyperlipidemia   . Hypertension   . Hypoxia   . Peripheral vascular disease ([place])   . Shingles Dec. 2016   Scalp  . Stroke ([place])   . Type II diabetes mellitus ([facility])    Anti-infectives    Start     Dose/Rate Route Frequency Ordered Stop   04/08/16 1645  vancomycin  (VANCOCIN) IVPB 1000 mg/200 mL premix     1,000 mg 200 mL/hr over 60 Minutes Intravenous  Once 04/08/16 1635     04/08/16 1645  ceFEPIme (MAXIPIME) 2 g in dextrose 5 % 50 mL IVPB     2 g 100 mL/hr over 30 Minutes Intravenous  Once 04/08/16 1640       Assessment: 65yo female with AMS and edema.  Asked to initiate Cefepime for suspected sepsis.   SCr and Lactate elevated.  Plan:  Preliminary review of pertinent patient information completed.  Protocol will be initiated with a one-time dose(s) of Cefepime 2gm.  No additional doses will be needed tonight.   Forestine Na clinical pharmacist will complete review during morning rounds to assess patient and finalize treatment regimen.  Ena Dawley, [place] 04/08/2016,4:40 PM

## 2016-04-08 NOTE — Progress Notes (Signed)
ANTICOAGULATION CONSULT NOTE - Initial Consult  Pharmacy Consult for Heparin Indication: Limb Ischemia  Allergies  Allergen Reactions  . Codeine Rash  . Penicillins Other (See Comments)    Has patient had a PCN reaction causing immediate rash, facial/tongue/throat swelling, SOB or lightheadedness with hypotension: unknown Has patient had a PCN reaction causing severe rash involving mucus membranes or skin necrosis: unknown Has patient had a PCN reaction that required hospitalization unknown Has patient had a PCN reaction occurring within the last 10 years: unknown If all of the above answers are "NO", then may proceed with Cephalosporin use.  . Ciprofloxacin Hives    Patient Measurements: Height: 5\' 3"  (160 cm) Weight: 200 lb (90.7 kg) IBW/kg (Calculated) : 52.4 HEPARIN DW (KG): 73.1  Vital Signs: BP: 103/68 (07/24 1800) Pulse Rate: 94 (07/24 1800)  Labs:  Recent Labs  04/08/16 1530 04/08/16 1531 04/08/16 1608  HGB 11.8*  --  13.9  HCT 41.3  --  41.0  PLT 229  --   --   LABPROT  --  16.4*  --   INR  --  1.31  --   CREATININE  --  2.37* 2.20*  TROPONINI  --  0.05*  --     Estimated Creatinine Clearance: 27.2 mL/min (by C-G formula based on SCr of 2.2 mg/dL).   Medical History: Past Medical History:  Diagnosis Date  . Anxiety   . Arthritis    "back" (10/30/2015)  . CHF (congestive heart failure) ([place])   . Chronic lower back pain   . COPD (chronic obstructive pulmonary disease) ([place])   . GERD (gastroesophageal reflux disease)   . Hyperlipidemia   . Hypertension   . Hypoxia   . Peripheral vascular disease ([place])   . Shingles Dec. 2016   Scalp  . Stroke ([place])   . Type II diabetes mellitus ([facility])     Medications:   (Not in a hospital admission)  Assessment: Okay for Protocol, no bleeding noted.  Hx PVD s/p R AKA noted.  Goal of Therapy:  Heparin level 0.3-0.7 units/ml Monitor platelets by anticoagulation protocol: Yes   Plan:  Give 4000 units bolus  x 1 Start heparin infusion at 1200 units/hr Check anti-Xa level in 6-8 hours and daily while on heparin Continue to monitor H&H and platelets  Pricilla Larsson 04/08/2016,6:16 PM

## 2016-04-08 NOTE — ED Triage Notes (Addendum)
Pt arrives to ED, pt family reporting altered mental status and generalized edema. Pt lips cyanotic,periorbital swelling as well. Pt noted to have emesis to mouth and on t-shirt at time of arrival. Pt alert and able to answer questions. Airway patent. Pupils pinpoint. Pt o2 saturation 70% on room air. Pt placed on 2 liters and is 95% at this time.

## 2016-04-08 NOTE — ED Provider Notes (Signed)
[facility] DEPT Provider Note   CSN: [id number] Arrival date & time: 04/08/16  1509  First Provider Contact:  First MD Initiated Contact with Patient 04/08/16 1535        History   Chief Complaint Chief Complaint  Patient presents with  . Edema    HPI RIVA WALLI is a 65 y.o. female.  Level V caveat for altered mental status. Patient brought by family members for he is increased edema to face and arms and leg over the past several days, increased confusion, blue lips. She's also had 3 episodes of vomiting today and was vomiting in triage. Patient hypoxic on room air was not wearing her home oxygen. She denies any chest pain. She complains of back pain which is chronic. She endorses feeling short of breath and not able take her medications due to nausea and vomiting over the past 2 days. Denies fever. Denies sick contacts. Denies abdominal pain or diarrhea. History of COPD, on home oxygen, CHF, PVD status post right AKA. She is not on any blood thinners.   The history is provided by the patient. The history is limited by the condition of the patient.    Past Medical History:  Diagnosis Date  . Anxiety   . Arthritis    "back" (10/30/2015)  . CHF (congestive heart failure) ([place])   . Chronic lower back pain   . COPD (chronic obstructive pulmonary disease) ([place])   . GERD (gastroesophageal reflux disease)   . Hyperlipidemia   . Hypertension   . Hypoxia   . Peripheral vascular disease ([place])   . Shingles Dec. 2016   [place]  . Stroke ([place])   . Type II diabetes mellitus [facility])     Patient Active Problem List   Diagnosis Date Noted  . COPD exacerbation ([place]) 03/06/2016  . COPD with acute exacerbation ([place]) 01/08/2016  . Acute respiratory failure with hypoxia ([place]) 01/08/2016  . Sepsis ([place]) 11/14/2015  . Pressure ulcer 11/14/2015  . Hypoxia 11/03/2015  . CHF (congestive heart failure) ([place]) 11/03/2015  . S/P AKA (above knee amputation) unilateral ([place])   . Abnormality of  gait   . Acute blood loss anemia   . Postoperative pain of extremity   . Phantom limb pain ([place])   . Essential hypertension   . Chronic obstructive pulmonary disease ([place])   . Chronic low back pain   . Poorly controlled type 2 diabetes mellitus with peripheral neuropathy ([place])   . PVD (peripheral vascular disease) ([place])   . Respiratory rate decreased   . Leukocytosis   . Atherosclerosis of right leg ([place]) 10/30/2015  . Pulmonary edema 08/18/2015  . CHF exacerbation ([place]) 08/18/2015  . SOB (shortness of breath) 08/18/2015  . Open wound of knee, leg (except thigh), and ankle 08/18/2015  . Abnormal echocardiogram   . SVT (supraventricular tachycardia) ([place])   . Lower limb ischemia 04/09/2015  . Critical lower limb ischemia 04/09/2015  . AKI (acute kidney injury) ([place]) 04/09/2015  . Hyperkalemia 04/09/2015  . Pancreatitis 12/26/2013  . Tachycardia 12/26/2013  . Dehydration 12/26/2013  . Vomiting 12/26/2013  . COPD (chronic obstructive pulmonary disease) ([place]) 12/26/2013  . Essential hypertension, benign 12/26/2013  . Diabetes ([place]) 12/26/2013  . Hypomagnesemia 12/26/2013    Past Surgical History:  Procedure Laterality Date  . AMPUTATION Right 10/30/2015   Procedure: RIGHT ABOVE KNEE AMPUTATION;  Surgeon: Elam Dutch, MD;  Location: [facility];  Service: Vascular;  Laterality: Right;  . BACK SURGERY    .  CARDIAC CATHETERIZATION    . CAROTID ENDARTERECTOMY Right 2010   Dr Kellie Simmering   . CAROTID ENDARTERECTOMY Left 03/2008   Archie Endo 01/15/2011  . FASCIOTOMY Right 04/10/2015   Procedure: FOUR COMPARTMENT FASCIOTOMY;  Surgeon: Elam Dutch, MD;  Location: [facility];  Service: Vascular;  Laterality: Right;  . FEMORAL-POPLITEAL BYPASS GRAFT Right 04/10/2015   Procedure: RIGHT  FEMORAL-BELOW THE KNEE POPLITEAL ARTERY BYPASS GRAFT USING 6 MM X 80 CM PROPATEN GRAFT;  Surgeon: Elam Dutch, MD;  Location: [facility];  Service: Vascular;  Laterality: Right;  . FOOT SURGERY Bilateral    "from  standing on concrete all day"  . INTRAOPERATIVE ARTERIOGRAM Right 04/10/2015   Procedure: INTRA OPERATIVE ARTERIOGRAM;  Surgeon: Elam Dutch, MD;  Location: [facility];  Service: Vascular;  Laterality: Right;  . LAPAROSCOPIC CHOLECYSTECTOMY    . LEG AMPUTATION THROUGH KNEE Right 10/30/2015  . PERIPHERAL VASCULAR CATHETERIZATION N/A 04/10/2015   Procedure: Abdominal Aortogram w/Lower Extremity;  Surgeon: Conrad [place], MD;  Location: [facility];  Service: Cardiovascular;  Laterality: N/A;  . PERIPHERAL VASCULAR CATHETERIZATION N/A 10/20/2015   Procedure: Abdominal Aortogram;  Surgeon: Elam Dutch, MD;  Location: [facility];  Service: Cardiovascular;  Laterality: N/A;  . POSTERIOR LUMBAR FUSION    . SHOULDER OPEN ROTATOR CUFF REPAIR Right 02/2006   Archie Endo 01/29/2011  . THROMBECTOMY FEMORAL ARTERY Right 04/10/2015   Procedure: THROMBECTOMY RIGHT TIBIAL  ARTERY WITH VEIN PATCH ANGIOPLASTY;  Surgeon: Elam Dutch, MD;  Location: [facility];  Service: Vascular;  Laterality: Right;    OB History    Gravida Para Term Preterm AB Living   3 3 3     3    SAB TAB Ectopic Multiple Live Births                   Home Medications    Prior to Admission medications   Medication Sig Start Date End Date Taking? Authorizing Provider  albuterol (PROVENTIL HFA;VENTOLIN HFA) 108 (90 Base) MCG/ACT inhaler Inhale 2 puffs into the lungs every 6 (six) hours as needed for wheezing or shortness of breath.    Historical Provider, MD  ALPRAZolam Duanne Moron) 0.25 MG tablet Take 0.25 mg by mouth 4 (four) times daily as needed for anxiety.     Historical Provider, MD  aspirin 325 MG tablet Take 1 tablet (325 mg total) by mouth daily. 04/15/15   Thurnell Lose, MD  atorvastatin (LIPITOR) 20 MG tablet Take 1 tablet (20 mg total) by mouth daily at 6 PM. 04/15/15   Thurnell Lose, MD  citalopram (CELEXA) 20 MG tablet Take 20 mg by mouth daily. 07/04/13   Historical Provider, MD  fluticasone furoate-vilanterol  (BREO ELLIPTA) 100-25 MCG/INH AEPB Inhale 1 puff into the lungs daily. 03/09/16   Lucia Gaskins, MD  furosemide (LASIX) 20 MG tablet Take 20 mg by mouth daily. 02/21/16   Historical Provider, MD  gabapentin (NEURONTIN) 300 MG capsule Take 300 mg by mouth 3 (three) times daily.    Historical Provider, MD  insulin aspart (NOVOLOG) 100 UNIT/ML injection Inject 10 Units into the skin 3 (three) times daily before meals.     Historical Provider, MD  Insulin Glargine (LANTUS SOLOSTAR) 100 UNIT/ML Solostar Pen Inject 30 Units into the skin at bedtime.     Historical Provider, MD  ipratropium-albuterol (DUONEB) 0.5-2.5 (3) MG/3ML SOLN Take 3 mLs by nebulization every 8 (eight) hours.     Historical Provider, MD  lisinopril (PRINIVIL,ZESTRIL)  20 MG tablet Take 1 tablet (20 mg total) by mouth daily. 11/04/15   Alvia Grove, PA-C  metFORMIN (GLUCOPHAGE) 500 MG tablet Take 500 mg by mouth 2 (two) times daily with a meal.    Historical Provider, MD  metoprolol tartrate (LOPRESSOR) 25 MG tablet Take 25 mg by mouth 2 (two) times daily.     Historical Provider, MD  naproxen (NAPROSYN) 500 MG tablet Take 500 mg by mouth 2 (two) times daily as needed for mild pain or moderate pain.  03/16/15   Historical Provider, MD  oxyCODONE-acetaminophen (PERCOCET) 10-325 MG tablet Take 1 tablet by mouth 4 (four) times daily as needed for pain.  02/16/16   Historical Provider, MD  pantoprazole (PROTONIX) 40 MG tablet Take 40 mg by mouth daily.    Historical Provider, MD  predniSONE (DELTASONE) 20 MG tablet Take 1 tablet (20 mg total) by mouth daily with breakfast. Take 2 tablets P.O. Daily for 7 days then decrease to 1 tab P.O. Daily for 7 days 03/09/16   Lucia Gaskins, MD    Family History Family History  Problem Relation Age of Onset  . Cancer Father   . Heart attack Sister   . Stroke Sister   . Hypertension Sister   . Hypertension Brother     Social History Social History  Substance Use Topics  . Smoking status:  Former Smoker    Packs/day: 0.12    Years: 49.00    Types: Cigarettes  . Smokeless tobacco: Never Used  . Alcohol use No     Allergies   Codeine; Penicillins; and Ciprofloxacin   Review of Systems Review of Systems  Constitutional: Positive for activity change, appetite change and chills. Negative for fever.  HENT: Negative for congestion and rhinorrhea.   Respiratory: Positive for shortness of breath. Negative for cough and chest tightness.   Cardiovascular: Positive for leg swelling. Negative for chest pain.  Gastrointestinal: Negative for abdominal pain, nausea and vomiting.  Genitourinary: Negative for dysuria and hematuria.  Musculoskeletal: Positive for arthralgias and myalgias.  Neurological: Positive for weakness. Negative for dizziness, light-headedness and headaches.   A complete 10 system review of systems was obtained and all systems are negative except as noted in the HPI and PMH.     Physical Exam Updated Vital Signs BP 99/77 (BP Location: Left Arm)   Pulse 103   Ht 5\' 3"  (1.6 m)   Wt 200 lb (90.7 kg)   SpO2 93%   BMI 35.43 kg/m   Physical Exam  Constitutional: She is oriented to person, place, and time. She appears well-developed and well-nourished. She appears distressed.  Patient able answer questions. She is oriented 3.  HENT:  Head: Normocephalic and atraumatic.  Eyes:  Significant periorbital Edema bilaterally  Cardiovascular: Normal rate and regular rhythm.   No murmur heard. Pulmonary/Chest:  Decreased breath sounds throughout, no appreciable wheezing  Abdominal: She exhibits distension. There is no guarding.  Musculoskeletal: She exhibits edema.  R AKA L leg cool. Unable to palpate DP or PT pulses DP and PT pulse not found by doppler Edema to bilateral arms   Neurological: She is alert and oriented to person, place, and time.  Moving all extremities, follows commands  Skin: Skin is dry. No erythema.     ED Treatments / Results    Labs (all labs ordered are listed, but only abnormal results are displayed) Labs Reviewed  MRSA PCR SCREENING - Abnormal; Notable for the following:  Result Value   MRSA by PCR POSITIVE (*)    All other components within normal limits  COMPREHENSIVE METABOLIC PANEL - Abnormal; Notable for the following:    Chloride 99 (*)    BUN 33 (*)    Creatinine, Ser 2.37 (*)    Total Bilirubin 1.3 (*)    GFR calc non Af Amer 20 (*)    GFR calc Af Amer 24 (*)    All other components within normal limits  BLOOD GAS, ARTERIAL - Abnormal; Notable for the following:    pH, Arterial 7.230 (*)    pCO2 arterial 64.0 (*)    All other components within normal limits  PROTIME-INR - Abnormal; Notable for the following:    Prothrombin Time 16.4 (*)    All other components within normal limits  BRAIN NATRIURETIC PEPTIDE - Abnormal; Notable for the following:    B Natriuretic Peptide 920.0 (*)    All other components within normal limits  TROPONIN I - Abnormal; Notable for the following:    Troponin I 0.05 (*)    All other components within normal limits  CBC WITH DIFFERENTIAL/PLATELET - Abnormal; Notable for the following:    WBC 10.8 (*)    Hemoglobin 11.8 (*)    MCH 24.4 (*)    MCHC 28.6 (*)    RDW 19.1 (*)    Monocytes Absolute 1.4 (*)    All other components within normal limits  URINALYSIS, ROUTINE W REFLEX MICROSCOPIC (NOT AT [facility]) - Abnormal; Notable for the following:    Bilirubin Urine SMALL (*)    Ketones, ur TRACE (*)    Protein, ur 30 (*)    All other components within normal limits  URINE MICROSCOPIC-ADD ON - Abnormal; Notable for the following:    Squamous Epithelial / LPF 6-30 (*)    Bacteria, UA FEW (*)    Casts HYALINE CASTS (*)    All other components within normal limits  TROPONIN I - Abnormal; Notable for the following:    Troponin I 0.08 (*)    All other components within normal limits  LACTIC ACID, PLASMA - Abnormal; Notable for the following:    Lactic Acid,  Venous 2.1 (*)    All other components within normal limits  LACTIC ACID, PLASMA - Abnormal; Notable for the following:    Lactic Acid, Venous 2.7 (*)    All other components within normal limits  GLUCOSE, CAPILLARY - Abnormal; Notable for the following:    Glucose-Capillary 133 (*)    All other components within normal limits  GLUCOSE, CAPILLARY - Abnormal; Notable for the following:    Glucose-Capillary 127 (*)    All other components within normal limits  I-STAT CG4 LACTIC ACID, ED - Abnormal; Notable for the following:    Lactic Acid, Venous 7.62 (*)    All other components within normal limits  I-STAT CHEM 8, ED - Abnormal; Notable for the following:    Chloride 98 (*)    BUN 32 (*)    Creatinine, Ser 2.20 (*)    All other components within normal limits  CULTURE, BLOOD (ROUTINE X 2)  CULTURE, BLOOD (ROUTINE X 2)  URINE CULTURE  CBC  BASIC METABOLIC PANEL  MAGNESIUM  PHOSPHORUS  TROPONIN I  CBG MONITORING, ED  Randolm Idol, ED  I-STAT CG4 LACTIC ACID, ED    EKG  EKG Interpretation  Date/Time:  Monday April 08 2016 16:19:09 EDT Ventricular Rate:  104 PR Interval:    QRS Duration: 100  QT Interval:  379 QTC Calculation: 499 R Axis:   39 Text Interpretation:  Sinus or ectopic atrial tachycardia Low voltage, extremity and precordial leads Borderline prolonged QT interval Lead(s) aVR were not used for morphology analysis No significant change was found Confirmed by Wyvonnia Dusky  MD, Drury Ardizzone [phone]) on 04/08/2016 4:23:56 PM       Radiology Ct Abdomen Pelvis Wo Contrast  Result Date: 04/08/2016 CLINICAL DATA:  Altered mental status with generalized body edema and facial cyanosis. EXAM: CT ABDOMEN AND PELVIS WITHOUT CONTRAST TECHNIQUE: Multidetector CT imaging of the abdomen and pelvis was performed following the standard protocol without IV contrast. COMPARISON:  CT 12/20/2012.  Ultrasound 12/26/2013. FINDINGS: Lower chest: There are moderate size dependent pleural  effusions, larger on the right. No significant pericardial effusion. There is atelectasis at both lung bases, worst in the right lower lobe. Hepatobiliary: The liver appears unremarkable as imaged in the noncontrast state. Previous cholecystectomy without evidence of significant biliary dilatation. Pancreas: The pancreas appears mildly atrophy, but unchanged. No focal surrounding inflammation. Spleen: Normal in size without focal abnormality. There is a stable small splenule. Adrenals/Urinary Tract: Both adrenal glands appear normal. Both kidneys appear unremarkable as imaged in the noncontrast state. No evidence of urinary tract calculus or hydronephrosis. The bladder is partially collapsed, likely accounting for mild prominence of its wall. Stomach/Bowel: No evidence of bowel wall thickening, distention or surrounding inflammatory change. Retrocecal surgical clips consistent with previous appendectomy. Vascular/Lymphatic: There are no enlarged abdominal or pelvic lymph nodes. Small retroperitoneal lymph nodes are not significantly enlarged. Stable aortic and branch vessel atherosclerosis. Reproductive: The uterus and ovaries appear stable. No evidence of adnexal mass. Other: Generalized anasarca with extensive subcutaneous edema. There is lesser mesenteric and retroperitoneal edema. A small amount of ascites is present. Musculoskeletal: No acute or significant osseous findings. Postsurgical changes status post lower lumbar fusion. There are injection granulomas in the buttocks bilaterally. IMPRESSION: 1. Anasarca with generalized soft tissue edema and moderate size bilateral pleural effusions. Mild ascites. 2. No acute intra-abdominal findings identified. 3. Aortic atherosclerosis and postsurgical changes as described. Electronically Signed   By: Richardean Sale M.D.   On: 04/08/2016 18:00  Ct Head Wo Contrast  Result Date: 04/08/2016 CLINICAL DATA:  Altered mental status and cyanosis EXAM: CT HEAD WITHOUT  CONTRAST TECHNIQUE: Contiguous axial images were obtained from the base of the skull through the vertex without intravenous contrast. COMPARISON:  04/11/2008 FINDINGS: Bony calvarium is intact. No findings to suggest acute hemorrhage, acute infarction or space-occupying mass lesion are noted. IMPRESSION: No acute abnormality noted. Electronically Signed   By: Inez Catalina M.D.   On: 04/08/2016 17:53  Dg Chest Portable 1 View  Result Date: 04/08/2016 CLINICAL DATA:  Altered mental status with generalized body edema. Periorbital swelling with facial cyanosis. EXAM: PORTABLE CHEST 1 VIEW COMPARISON:  03/06/2016 and 01/07/2016. FINDINGS: 1545 hours. Overall lower lung volumes. The heart is enlarged. There is chronic vascular congestion with lesser aeration of the lung bases. A small amount of pleural fluid remains likely. There is no pneumothorax or overt pulmonary edema. The bones appear unchanged status post distal right clavicle resection. Surgical clips are present in the lower neck. IMPRESSION: Cardiomegaly with chronic vascular congestion. There is lesser aeration of the lung bases with possible bilateral pleural effusions. Electronically Signed   By: Richardean Sale M.D.   On: 04/08/2016 16:02   Procedures Procedures (including critical care time)  Medications Ordered in ED Medications  ipratropium-albuterol (DUONEB) 0.5-2.5 (3) MG/3ML nebulizer solution  3 mL (not administered)  furosemide (LASIX) injection 40 mg (not administered)     Initial Impression / Assessment and Plan / ED Course  I have reviewed the triage vital signs and the nursing notes.  Pertinent labs & imaging results that were available during my care of the patient were reviewed by me and considered in my medical decision making (see chart for details).  Clinical Course   Fluid overload with vomiting, altered mental status. Denies chest pain.  EKG without acute ST changes.  Unable to palpate or Doppler PT are DP pulses  on the left. Foot is cool. She denies any pain in her leg. We'll discussed with vascular surgery.  Discussed with vascular surgery Dr. early. He agrees with transfer to [facility] for vascular surgery evaluation. Patient however also has significant medical comorbidities including metabolic acidosis with lactate of 7. She also has worsening renal failure. Potassium is normal.  Patient has anasarca with congestion on Xray. She may be septic but will not give fluid bolus at this time due to her volume overloaded state and oxygen requirement.  We'll obtain CT head and then start heparin drip if there is no evidence of any hemorrhage. ICU admission discussed with Dr. Lamonte Sakai. He agrees with admission for potential occult sepsis and vascular surgery evaluation. Antibiotics given after blood and urine cultures obtained. Dr. Lamonte Sakai agrees with no IV fluids unless she becomes hypotensive as her respiratory status is tenuous but she is stable for transfer on a nasal cannula.  Will also obtain CT abdomen to evaluate for possible mesenteric ischemia given her recent vomiting and elevated lactate.  CT head negative.  CT abdomen with anasarca, no evidence of mesenteric ischemia. Heparin gtt started.  Airway stable at time of transfer to [facility] ICU.  CRITICAL CARE Performed by: Ezequiel Essex Total critical care time: 45 minutes Critical care time was exclusive of separately billable procedures and treating other patients. Critical care was necessary to treat or prevent imminent or life-threatening deterioration. Critical care was time spent personally by me on the following activities: development of treatment plan with patient and/or surrogate as well as nursing, discussions with consultants, evaluation of patient's response to treatment, examination of patient, obtaining history from patient or surrogate, ordering and performing treatments and interventions, ordering and review of laboratory studies,  ordering and review of radiographic studies, pulse oximetry and re-evaluation of patient's condition.   Final Clinical Impressions(s) / ED Diagnoses   Final diagnoses:  Metabolic acidosis  Critical lower limb ischemia  Anasarca  Sepsis, due to unspecified organism [facility])    New Prescriptions New Prescriptions   No medications on file     Ezequiel Essex, MD 04/09/16 0148

## 2016-04-08 NOTE — ED Notes (Signed)
CRITICAL VALUE ALERT  Critical value received:  0.05 troponin  Date of notification:  04/08/16  Time of notification:  [id number]  Critical value read back:Yes.    Nurse who received alert:  c Jodean Valade rn  MD notified (1st page):   Time of first page:   MD notified (2nd page):  Time of second page:  Responding MD:  rancour  Time MD responded:  [phone]

## 2016-04-08 NOTE — ED Notes (Signed)
Unable to obtain DP or PT pulses in left foot. Foot is swollen and cool to touch

## 2016-04-08 NOTE — Progress Notes (Signed)
CRITICAL VALUE ALERT  Critical value received:  Lactic Acid  Date of notification: 04/08/2016  Time of notification: 2230  Critical value read back: yes, LA 2.1  Nurse who received alert: Edmonia Lynch, RN  MD notified (1st page):   Dr Lamonte Sakai notified by Edmonia Lynch, RN Time of first page:   MD notified (2nd page):  Time of second page:  Responding MD:   Time MD responded:

## 2016-04-09 ENCOUNTER — Inpatient Hospital Stay (HOSPITAL_COMMUNITY): Payer: Medicare Other

## 2016-04-09 ENCOUNTER — Ambulatory Visit (HOSPITAL_COMMUNITY): Payer: Medicare Other | Admitting: Physical Therapy

## 2016-04-09 DIAGNOSIS — J9602 Acute respiratory failure with hypercapnia: Secondary | ICD-10-CM

## 2016-04-09 DIAGNOSIS — R4182 Altered mental status, unspecified: Secondary | ICD-10-CM

## 2016-04-09 DIAGNOSIS — R6521 Severe sepsis with septic shock: Secondary | ICD-10-CM

## 2016-04-09 DIAGNOSIS — R7989 Other specified abnormal findings of blood chemistry: Secondary | ICD-10-CM

## 2016-04-09 DIAGNOSIS — A419 Sepsis, unspecified organism: Secondary | ICD-10-CM

## 2016-04-09 LAB — LACTIC ACID, PLASMA
LACTIC ACID, VENOUS: 2.7 mmol/L — AB (ref 0.5–1.9)
LACTIC ACID, VENOUS: 2.7 mmol/L — AB (ref 0.5–1.9)
Lactic Acid, Venous: 2.1 mmol/L (ref 0.5–1.9)
Lactic Acid, Venous: 1.7 mmol/L (ref 0.5–1.9)
Lactic Acid, Venous: 2.9 mmol/L (ref 0.5–1.9)

## 2016-04-09 LAB — BASIC METABOLIC PANEL
ANION GAP: 8 (ref 5–15)
BUN: 32 mg/dL — ABNORMAL HIGH (ref 6–20)
CHLORIDE: 99 mmol/L — AB (ref 101–111)
CO2: 33 mmol/L — ABNORMAL HIGH (ref 22–32)
Calcium: 9.1 mg/dL (ref 8.9–10.3)
Creatinine, Ser: 2.05 mg/dL — ABNORMAL HIGH (ref 0.44–1.00)
GFR calc non Af Amer: 24 mL/min — ABNORMAL LOW (ref >60)
GFR, EST AFRICAN AMERICAN: 28 mL/min — AB (ref >60)
Glucose, Bld: 105 mg/dL — ABNORMAL HIGH (ref 65–99)
POTASSIUM: 4.5 mmol/L (ref 3.5–5.1)
SODIUM: 140 mmol/L (ref 135–145)

## 2016-04-09 LAB — ECHOCARDIOGRAM COMPLETE
Height: 63 in
Weight: 3474.45 oz

## 2016-04-09 LAB — CBC
HCT: 35.6 % — ABNORMAL LOW (ref 36.0–46.0)
HEMOGLOBIN: 10 g/dL — AB (ref 12.0–15.0)
MCH: 23.1 pg — AB (ref 26.0–34.0)
MCHC: 28.1 g/dL — ABNORMAL LOW (ref 30.0–36.0)
MCV: 82.4 fL (ref 78.0–100.0)
Platelets: 202 10*3/uL (ref 150–400)
RBC: 4.32 MIL/uL (ref 3.87–5.11)
RDW: 19 % — ABNORMAL HIGH (ref 11.5–15.5)
WBC: 6.3 10*3/uL (ref 4.0–10.5)

## 2016-04-09 LAB — GLUCOSE, CAPILLARY
GLUCOSE-CAPILLARY: 101 mg/dL — AB (ref 65–99)
GLUCOSE-CAPILLARY: 84 mg/dL (ref 65–99)
GLUCOSE-CAPILLARY: 178 mg/dL — AB (ref 65–99)
Glucose-Capillary: 102 mg/dL — ABNORMAL HIGH (ref 65–99)
Glucose-Capillary: 127 mg/dL — ABNORMAL HIGH (ref 65–99)
Glucose-Capillary: 148 mg/dL — ABNORMAL HIGH (ref 65–99)
Glucose-Capillary: 194 mg/dL — ABNORMAL HIGH (ref 65–99)

## 2016-04-09 LAB — PHOSPHORUS: PHOSPHORUS: 4.7 mg/dL — AB (ref 2.5–4.6)

## 2016-04-09 LAB — TROPONIN I: TROPONIN I: 0.06 ng/mL — AB (ref <0.03)

## 2016-04-09 LAB — HEPARIN LEVEL (UNFRACTIONATED)
HEPARIN UNFRACTIONATED: 0.74 [IU]/mL — AB (ref 0.30–0.70)
HEPARIN UNFRACTIONATED: 0.55 [IU]/mL (ref 0.30–0.70)

## 2016-04-09 LAB — MAGNESIUM: MAGNESIUM: 1.5 mg/dL — AB (ref 1.7–2.4)

## 2016-04-09 MED ORDER — CEFEPIME HCL 2 G IJ SOLR
2.0000 g | INTRAMUSCULAR | Status: DC
  Administered 2016-04-09 – 2016-04-10 (×2): 2 g via INTRAVENOUS
  Filled 2016-04-09 (×3): qty 2

## 2016-04-09 MED ORDER — SODIUM CHLORIDE 0.9 % IV SOLN
INTRAVENOUS | Status: DC
  Administered 2016-04-09 – 2016-04-10 (×4): via INTRAVENOUS

## 2016-04-09 MED ORDER — HEPARIN (PORCINE) IN NACL 100-0.45 UNIT/ML-% IJ SOLN
1350.0000 [IU]/h | INTRAMUSCULAR | Status: DC
  Administered 2016-04-09 – 2016-04-10 (×2): 1100 [IU]/h via INTRAVENOUS
  Filled 2016-04-09 (×3): qty 250

## 2016-04-09 MED ORDER — PERFLUTREN LIPID MICROSPHERE
INTRAVENOUS | Status: AC
  Filled 2016-04-09: qty 10

## 2016-04-09 MED ORDER — PHENYLEPHRINE HCL 10 MG/ML IJ SOLN
0.0000 ug/min | INTRAVENOUS | Status: DC
  Administered 2016-04-09: 20 ug/min via INTRAVENOUS
  Administered 2016-04-09: 40 ug/min via INTRAVENOUS
  Administered 2016-04-09: 20 ug/min via INTRAVENOUS
  Filled 2016-04-09 (×5): qty 1

## 2016-04-09 MED ORDER — PERFLUTREN LIPID MICROSPHERE
1.0000 mL | INTRAVENOUS | Status: AC | PRN
  Administered 2016-04-09: 2 mL via INTRAVENOUS
  Filled 2016-04-09: qty 10

## 2016-04-09 MED ORDER — PNEUMOCOCCAL 13-VAL CONJ VACC IM SUSP: 0.5000 mL | INTRAMUSCULAR | Status: DC | PRN

## 2016-04-09 NOTE — Progress Notes (Signed)
[place] for Heparin Indication: ischemic limb  Allergies  Allergen Reactions  . Codeine Rash  . Penicillins Other (See Comments)    Has patient had a PCN reaction causing immediate rash, facial/tongue/throat swelling, SOB or lightheadedness with hypotension: unknown Has patient had a PCN reaction causing severe rash involving mucus membranes or skin necrosis: unknown Has patient had a PCN reaction that required hospitalization unknown Has patient had a PCN reaction occurring within the last 10 years: unknown If all of the above answers are "NO", then may proceed with Cephalosporin use.  . Ciprofloxacin Hives    Patient Measurements: Height: 5\' 3"  (160 cm) Weight: 217 lb 2.5 oz (98.5 kg) IBW/kg (Calculated) : 52.4 Heparin Dosing Weight: 75.4 kg  Vital Signs: Temp: 97.3 F (36.3 C) (07/25 1622) Temp Source: Oral (07/25 1622) BP: 105/62 (07/25 1515) Pulse Rate: 113 (07/25 1515)  Labs:  Recent Labs  04/08/16 1530 04/08/16 1531 04/08/16 1608 04/08/16 2132 04/09/16 0237 04/09/16 0845 04/09/16 1844  HGB 11.8*  --  13.9  --  10.0*  --   --   HCT 41.3  --  41.0  --  35.6*  --   --   PLT 229  --   --   --  202  --   --   LABPROT  --  16.4*  --   --   --   --   --   INR  --  1.31  --   --   --   --   --   HEPARINUNFRC  --   --   --   --   --  0.74* 0.55  CREATININE  --  2.37* 2.20*  --  2.05*  --   --   TROPONINI  --  0.05*  --  0.08* 0.06*  --   --     Estimated Creatinine Clearance: 30.6 mL/min (by C-G formula based on SCr of 2.05 mg/dL).   Medical History: Past Medical History:  Diagnosis Date  . Anxiety   . Arthritis    "back" (10/30/2015)  . CHF (congestive heart failure) ([place])   . Chronic lower back pain   . COPD (chronic obstructive pulmonary disease) ([place])   . GERD (gastroesophageal reflux disease)   . Hyperlipidemia   . Hypertension   . Hypoxia   . Peripheral vascular disease ([place])   . Shingles Dec. 2016   Scalp   . Stroke ([place])   . Type II diabetes mellitus (H[facility])     Medications:  Prescriptions Prior to Admission  Medication Sig Dispense Refill Last Dose  . ALPRAZolam (XANAX) 0.25 MG tablet Take 0.25 mg by mouth 2 (two) times daily.    03/06/2016 at Unknown time  . atorvastatin (LIPITOR) 20 MG tablet Take 1 tablet (20 mg total) by mouth daily at 6 PM. 30 tablet 0 03/06/2016 at Unknown time  . citalopram (CELEXA) 20 MG tablet Take 20 mg by mouth daily.   03/06/2016 at Unknown time  . fluticasone furoate-vilanterol (BREO ELLIPTA) 100-25 MCG/INH AEPB Inhale 1 puff into the lungs daily. 1 each 5   . furosemide (LASIX) 20 MG tablet Take 20 mg by mouth daily.   03/06/2016 at Unknown time  . gabapentin (NEURONTIN) 300 MG capsule Take 300 mg by mouth 2 (two) times daily.    03/06/2016 at Unknown time  . ipratropium-albuterol (DUONEB) 0.5-2.5 (3) MG/3ML SOLN Take 3 mLs by nebulization every 6 (six) hours as needed (wheezing).  03/06/2016 at Unknown time  . lisinopril (PRINIVIL,ZESTRIL) 20 MG tablet Take 1 tablet (20 mg total) by mouth daily. 30 tablet 0 03/06/2016 at Unknown time  . metFORMIN (GLUCOPHAGE) 500 MG tablet Take 500 mg by mouth 2 (two) times daily with a meal.   03/06/2016 at Unknown time  . metoprolol tartrate (LOPRESSOR) 25 MG tablet Take 25 mg by mouth 2 (two) times daily.    03/06/2016 at [place]  . naproxen (NAPROSYN) 500 MG tablet Take by mouth 2 (two) times daily as needed for mild pain or moderate pain.    unknown  . oxyCODONE-acetaminophen (PERCOCET) 10-325 MG tablet Take 1 tablet by mouth 4 (four) times daily as needed for pain.    03/06/2016 at Unknown time  . pantoprazole (PROTONIX) 40 MG tablet Take 40 mg by mouth daily.   03/06/2016 at Unknown time  . predniSONE (DELTASONE) 20 MG tablet Take 1 tablet (20 mg total) by mouth daily with breakfast. Take 2 tablets P.O. Daily for 7 days then decrease to 1 tab P.O. Daily for 7 days 21 tablet 1   . albuterol (PROVENTIL HFA;VENTOLIN HFA) 108 (90 Base)  MCG/ACT inhaler Inhale 2 puffs into the lungs every 6 (six) hours as needed for wheezing or shortness of breath.   03/06/2016 at Unknown time  . aspirin 325 MG tablet Take 1 tablet (325 mg total) by mouth daily. 30 tablet 0 03/06/2016 at Unknown time  . insulin aspart (NOVOLOG) 100 UNIT/ML injection Inject 10 Units into the skin 3 (three) times daily before meals.    03/06/2016 at Unknown time  . Insulin Glargine (LANTUS SOLOSTAR) 100 UNIT/ML Solostar Pen Inject 30 Units into the skin at bedtime.    03/05/2016 at Unknown time   Scheduled:  . antiseptic oral rinse  7 mL Mouth Rinse q12n4p  . aspirin  325 mg Oral Daily  . atorvastatin  20 mg Oral q1800  . budesonide (PULMICORT) nebulizer solution  0.5 mg Nebulization BID  . ceFEPime (MAXIPIME) IV  2 g Intravenous Q24H  . chlorhexidine  15 mL Mouth Rinse BID  . Chlorhexidine Gluconate Cloth  6 each Topical Q0600  . insulin aspart  0-15 Units Subcutaneous Q4H  . ipratropium-albuterol  3 mL Nebulization Q6H  . mupirocin ointment  1 application Nasal BID  . pantoprazole  40 mg Oral Q1200   Infusions:  . sodium chloride 100 mL/hr at 04/09/16 1647  . heparin 1,100 Units/hr (04/09/16 1057)  . phenylephrine (NEO-SYNEPHRINE) Adult infusion 20 mcg/min (04/09/16 1641)    Assessment: 65yo female with ischemic limb. Pharmacy is consulted to dose heparin for ischemic limb. Initial HL is elevated at 0.74 on heparin 1200 units/hr. Dose was reduced to 1100 units/hr and is now therapeutic at 0.55. No issues with infusion or bleeding noted.  Goal of Therapy:  Heparin level 0.3-0.7 units/ml Monitor platelets by anticoagulation protocol: Yes   Plan:  Continue heparin 1100 units/hr Daily HL/CBC Monitor s/sx of bleeding  Andrey Cota. Diona Foley, PharmD, BCPS Clinical Pharmacist Pager [phone] 04/09/2016,7:20 PM

## 2016-04-09 NOTE — Progress Notes (Signed)
eLink Physician-Brief Progress Note Patient Name: [name] DOB: 20-Apr-1951 MRN: [id number]   Date of Service  04/09/2016  HPI/Events of Note  Patient off vasopressors, BP improved, no reason to check LA at this time, no symptoms noted  eICU Interventions  Prn bipap ordered        Flora Lipps 04/09/2016, 10:24 PM

## 2016-04-09 NOTE — Progress Notes (Addendum)
[facility]  Subjective  - Slept pretty good.  States her back is hurting, but her foot is not hurting.   Objective (!) 104/41 (!) 109 97.1 F (36.2 C) (Oral) 16 100%  Intake/Output Summary (Last 24 hours) at 04/09/16 0748 Last data filed at 04/09/16 0600  Gross per 24 hour  Intake             1177 ml  Output             1375 ml  Net             -198 ml    No doppler signals left LE Pitting edema Active range of motion intact of the left foot and ankle without pain  Assessment/Planning: Active range of motion without pain in the left LE and sensation grossly intact.  Her last formal arteriogram was in February 2017. At that time she had a patent superficial femoral artery with narrowing at the adductor canal. The popliteal artery was patent and there was a significant tibial disease. The anterior tibial was occluded near its origin, the peroneal artery became quite small above the ankle and the posterior tibial artery was small but patent into the foot.  Dr. Oneida Alar will see her later today   Laurence Slate [facility] 04/09/2016 7:48 AM -- Pt admitted with primarily pulmonary issues.  Left foot is asymptomatic.  Has dp/pt doppler signal.  Not really a candidate for any vascular intervention currently even if her foot got worse.  ABI were 0.9 5 months ago with agram findings listed above.  Try to wean Neo to improve peripheral perfusion  Available if questions.  No reason for heparin for leg from my standpoint  Pt can follow up with me PRN as outpatient if develops rest pain in foot  Ruta Hinds, MD [facility]: [phone] Pager: [phone]  Laboratory Lab Results:  Recent Labs  04/08/16 1530 04/08/16 1608 04/09/16 0237  WBC 10.8*  --  6.3  HGB 11.8* 13.9 10.0*  HCT 41.3 41.0 35.6*  PLT 229  --  202   BMET  Recent Labs  04/08/16 1531 04/08/16 1608 04/09/16 0237  NA 140 141 140   K 4.7 4.4 4.5  CL 99* 98* 99*  CO2 27  --  33*  GLUCOSE 98 90 105*  BUN 33* 32* 32*  CREATININE 2.37* 2.20* 2.05*  CALCIUM 9.0  --  9.1    COAG Lab Results  Component Value Date   INR 1.31 04/08/2016   INR 0.9 11/09/2008   INR 1.0 04/12/2008   No results found for: PTT

## 2016-04-09 NOTE — Progress Notes (Signed)
PULMONARY / CRITICAL CARE MEDICINE   Name: [name] MRN: [id number] DOB: 1951-06-21    ADMISSION DATE:  04/08/2016 CONSULTATION DATE:  04/08/16  REFERRING MD:  [place] - [facility] ED  CHIEF COMPLAINT:  SOB, AMS  HISTORY OF PRESENT ILLNESS:   [name] is a 65 y.o. 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  She presented to [facility] ED 7/24 with AMS, generalized edema, and SOB.  Symptoms started earlier that morning and got to the point where she was cyanotic in her face and lips.  She later developed nausea with 3 episodes of vomiting. Due to the degree of her swelling along with cyanosis, family decided to take her in for evaluation.  In ED, she was found to have mild hypercarbia (7.23 / 64 / 100), AoCKD, lactic acidosis, troponin bump.  She had CT of the head which was unremarkable and CT of the abdomen and pelvis which demonstrated anasarca with mild ascites and small bilateral pleural effusions.  She also had left foot that was cool to touch and had no pulses palpable or dopplerable.  She was subsequently transferred to [facility] for further evaluation and management.  SUBJECTIVE:  Feels better this AM, no new complaints or events overnight.  VITAL SIGNS: BP 118/82   Pulse (!) 112   Temp 97.3 F (36.3 C) (Oral)   Resp 17   Ht 5\' 3"  (1.6 m)   Wt 98.5 kg (217 lb 2.5 oz)   SpO2 100%   BMI 38.47 kg/m   HEMODYNAMICS:    VENTILATOR SETTINGS:    INTAKE / OUTPUT: I/O last 3 completed shifts: In: 1352 [I.V.:1352] Out: [id number] [Urine:1375]  PHYSICAL EXAMINATION: General: Chronically ill appearing female, in NAD. Neuro: A&O x 3, non-focal.  HEENT: [place]/AT. PERRL, sclerae anicteric. Cardiovascular: RRR, no M/R/G. Left leg cool, PT and peroneal pulses dopplerable by vascular surgery. Lungs: Respirations even and unlabored.  Faint basilar crackles. Abdomen: Abdomen distended and tight.  BS x 4, non-tender. Musculoskeletal: R BKA.  LLE with 1+ non-pitting edema.   Skin: Intact, no rashes.  Left foot cool.  LABS:  BMET  Recent Labs Lab 04/08/16 1531 04/08/16 1608 04/09/16 0237  NA 140 141 140  K 4.7 4.4 4.5  CL 99* 98* 99*  CO2 27  --  33*  BUN 33* 32* 32*  CREATININE 2.37* 2.20* 2.05*  GLUCOSE 98 90 105*    Electrolytes  Recent Labs Lab 04/08/16 1531 04/09/16 0237  CALCIUM 9.0 9.1  MG  --  1.5*  PHOS  --  4.7*    CBC  Recent Labs Lab 04/08/16 1530 04/08/16 1608 04/09/16 0237  WBC 10.8*  --  6.3  HGB 11.8* 13.9 10.0*  HCT 41.3 41.0 35.6*  PLT 229  --  202    Coag's  Recent Labs Lab 04/08/16 1531  INR 1.31    Sepsis Markers  Recent Labs Lab 04/08/16 2132 04/09/16 0023 04/09/16 0845  LATICACIDVEN 2.1* 2.7* 1.7    ABG  Recent Labs Lab 04/08/16 1605  PHART 7.230*  PCO2ART 64.0*  PO2ART 100.00    Liver Enzymes  Recent Labs Lab 04/08/16 1531  AST 34  ALT 19  ALKPHOS 77  BILITOT 1.3*  ALBUMIN 3.9    Cardiac Enzymes  Recent Labs Lab 04/08/16 1531 04/08/16 2132 04/09/16 0237  TROPONINI 0.05* 0.08* 0.06*    Glucose  Recent Labs Lab 04/08/16 1536 04/08/16 2012 04/08/16 2333 04/09/16 0436 04/09/16 0803  GLUCAP 98  133* 127* 101* 84    Imaging Ct Abdomen Pelvis Wo Contrast  Result Date: 04/08/2016 CLINICAL DATA:  Altered mental status with generalized body edema and facial cyanosis. EXAM: CT ABDOMEN AND PELVIS WITHOUT CONTRAST TECHNIQUE: Multidetector CT imaging of the abdomen and pelvis was performed following the standard protocol without IV contrast. COMPARISON:  CT 12/20/2012.  Ultrasound 12/26/2013. FINDINGS: Lower chest: There are moderate size dependent pleural effusions, larger on the right. No significant pericardial effusion. There is atelectasis at both lung bases, worst in the right lower lobe. Hepatobiliary: The liver appears unremarkable as imaged in the noncontrast state. Previous cholecystectomy without evidence of significant biliary dilatation. Pancreas: The  pancreas appears mildly atrophy, but unchanged. No focal surrounding inflammation. Spleen: Normal in size without focal abnormality. There is a stable small splenule. Adrenals/Urinary Tract: Both adrenal glands appear normal. Both kidneys appear unremarkable as imaged in the noncontrast state. No evidence of urinary tract calculus or hydronephrosis. The bladder is partially collapsed, likely accounting for mild prominence of its wall. Stomach/Bowel: No evidence of bowel wall thickening, distention or surrounding inflammatory change. Retrocecal surgical clips consistent with previous appendectomy. Vascular/Lymphatic: There are no enlarged abdominal or pelvic lymph nodes. Small retroperitoneal lymph nodes are not significantly enlarged. Stable aortic and branch vessel atherosclerosis. Reproductive: The uterus and ovaries appear stable. No evidence of adnexal mass. Other: Generalized anasarca with extensive subcutaneous edema. There is lesser mesenteric and retroperitoneal edema. A small amount of ascites is present. Musculoskeletal: No acute or significant osseous findings. Postsurgical changes status post lower lumbar fusion. There are injection granulomas in the buttocks bilaterally. IMPRESSION: 1. Anasarca with generalized soft tissue edema and moderate size bilateral pleural effusions. Mild ascites. 2. No acute intra-abdominal findings identified. 3. Aortic atherosclerosis and postsurgical changes as described. Electronically Signed   By: Richardean Sale M.D.   On: 04/08/2016 18:00  Ct Head Wo Contrast  Result Date: 04/08/2016 CLINICAL DATA:  Altered mental status and cyanosis EXAM: CT HEAD WITHOUT CONTRAST TECHNIQUE: Contiguous axial images were obtained from the base of the skull through the vertex without intravenous contrast. COMPARISON:  04/11/2008 FINDINGS: Bony calvarium is intact. No findings to suggest acute hemorrhage, acute infarction or space-occupying mass lesion are noted. IMPRESSION: No acute  abnormality noted. Electronically Signed   By: Inez Catalina M.D.   On: 04/08/2016 17:53  Dg Chest Port 1 View  Result Date: 04/09/2016 CLINICAL DATA:  Respiratory failure, history of COPD, CHF, diabetes EXAM: PORTABLE CHEST 1 VIEW COMPARISON:  Portable chest x-ray of April 08, 2016 FINDINGS: The lungs are reasonably well inflated. There is increased density in the right mid and lower lung worrisome for interstitial edema. Bilateral pleural effusions layering posteriorly are suspected. The cardiac silhouette is enlarged. The pulmonary vascularity is mildly engorged. There is no pneumothorax. IMPRESSION: CHF with slight increased prominence of interstitial edema today especially on the right. Right basilar atelectasis or pneumonia. Probable smaller moderate-sized bilateral pleural effusions layering posteriorly. Electronically Signed   By: David  Martinique M.D.   On: 04/09/2016 07:33  Dg Chest Portable 1 View  Result Date: 04/08/2016 CLINICAL DATA:  Altered mental status with generalized body edema. Periorbital swelling with facial cyanosis. EXAM: PORTABLE CHEST 1 VIEW COMPARISON:  03/06/2016 and 01/07/2016. FINDINGS: 1545 hours. Overall lower lung volumes. The heart is enlarged. There is chronic vascular congestion with lesser aeration of the lung bases. A small amount of pleural fluid remains likely. There is no pneumothorax or overt pulmonary edema. The bones appear unchanged status post distal  right clavicle resection. Surgical clips are present in the lower neck. IMPRESSION: Cardiomegaly with chronic vascular congestion. There is lesser aeration of the lung bases with possible bilateral pleural effusions. Electronically Signed   By: Richardean Sale M.D.   On: 04/08/2016 16:02    STUDIES:  CT head 7/24 > no acute process. CT A / P 7/24 > anasarca with mild ascites and small bilateral pleural effusions.  CULTURES: None.  ANTIBIOTICS: None.  SIGNIFICANT EVENTS: 7/24 > admit.  Seen by vascular  surgery for cold left leg, + PT and peroneal pulses > no emergent surgical intervention required.  LINES/TUBES: R PICC line 7/24>>>  DISCUSSION: 65 y.o. F with multiple medical issues, transferred to [facility] 7/24 due to cold LLE along with AoCKD.  Seen by vascular on arrival to [facility], PT and peroneal pulses present therefore no emergent surgery needed.  Started on heparin in the meantime.  ASSESSMENT / PLAN:  PULMONARY A: Acute hypoxic and hypercarbic respiratory failure. COPD by report - no PFT's in system. Probable OSA / OHS. P:   BiPAP PRN. Continue supplemental O2 as needed to maintain SpO2 of 88-92%. Budesonide in lieu of outpatient Breo. Albuterol / DuoNebs. Assess ambulatory desaturation study prior to discharge to assess for home O2 needs.  CARDIOVASCULAR A:  Cold LLE - seen by vascular surgery this evening, no emergent surgery needed. Troponin leak - suspect demand ischemia. Hx HTN, HLD, CHF (Echo from Dec 2016 with EF 50-55%). P:  Continue heparin gtt. Neo for BP support, currently at 25 mcg. Vascular surgery following, appreciate the assistance, no surgery for now. Continue outpatient ASA, atorvastatin. Hold outpatient furosemide, lisinopril, lopressor.Marland Kitchen  RENAL A:   AoCKD - on ACEi + NSAID's, also has had vomiting. P:   KVO IVF given pulmonary edema on CXR. Correct electrolytes as indicated. BMP in AM.  GASTROINTESTINAL A:   GERD. Ascites. Nutrition. P:   Continue outpatient PPI. Begin diet since no OR visit.  HEMATOLOGIC A:   VTE Prophylaxis. P:  Heparin gtt. CBC in AM.  INFECTIOUS A:   No indication of infection. P:   Assess PCT, if high then add empiric abx.  ENDOCRINE A:   DM.   P:   SSI. Hold outpatient metformin, novolog, lantus.  NEUROLOGIC A:   Hx anxiety, stroke. P:   Limit sedating meds. Low dose fentanyl PRN pain. Hold outpatient alprazolam, citalopram, gabapentin, percocet..  Family updated: None  available.  Interdisciplinary Family Meeting v Palliative Care Meeting:  Due by: 07/31.  The patient is critically ill with multiple organ systems failure and requires high complexity decision making for assessment and support, frequent evaluation and titration of therapies, application of advanced monitoring technologies and extensive interpretation of multiple databases.   Critical Care Time devoted to patient care services described in this note is  35  Minutes. This time reflects time of care of this signee Dr Jennet Maduro. This critical care time does not reflect procedure time, or teaching time or supervisory time of PA/NP/Med student/Med Resident etc but could involve care discussion time.  Rush Farmer, M.D. [facility] Pulmonary/Critical Care Medicine. Pager: [phone]. After hours pager: [phone].

## 2016-04-09 NOTE — Progress Notes (Signed)
  Echocardiogram 2D Echocardiogram with Definity has been performed.  Jennette Dubin 04/09/2016, 4:59 PM

## 2016-04-09 NOTE — Progress Notes (Signed)
eLink Physician-Brief Progress Note Patient Name: [name] DOB: April 03, 1951 MRN: [id number]   Date of Service  04/09/2016  HPI/Events of Note  Hypotension - BP = 56/33. Last LVEF = 50% to 55%. However, CXR reveals cardiomegaly and pulmonary vascular congestion. No CVL.  eICU Interventions  Will order: 1. Phenylephrine IV infusion. Titrate to MAP >= 65.     Intervention Category Major Interventions: Hypotension - evaluation and management  Haeven Nickle [place] 04/09/2016, 3:52 AM

## 2016-04-09 NOTE — Consult Note (Signed)
[place] Nurse wound consult note Reason for Consult: LLE Wound type: none Patient seen by VVS yesterday, has previous RLE amputation. No surgical needs at this time.  No open wounds, no weeping, mild edema. Foot is cool. Pulse is absent, except by doppler.  Patient does not report pain Some mild erythema but not significant No topical care needed at this time.  Discussed POC with patient and bedside nurse.  Re consult if needed, will not follow at this time. Thanks  Rubie Ficco [employer], RN,CNS, [place] [phone])

## 2016-04-09 NOTE — Progress Notes (Signed)
CRITICAL VALUE ALERT  Critical value received:  Lactic Acid 2.7  Date of notification:  04/09/16  Time of notification:  [id number]  Critical value read back:Yes.    Nurse who received alert:  Obie Dredge, RN  MD notified (1st page):  Dr. Nelda Marseille, MD  Time of first page:  1:04 PM  MD notified (2nd page):  Time of second page:  Responding MD:  Dr. Nelda Marseille, MD  Time MD responded:  1:05 PM  No new orders received at this time.  Sherlie Ban, RN 04/09/16 1:05 PM

## 2016-04-09 NOTE — Progress Notes (Signed)
ANTIBIOTIC / [place] for heparin and cefepime Indications: limb ischemia, suspected sepsis  Allergies  Allergen Reactions  . Codeine Rash  . Penicillins Other (See Comments)    Has patient had a PCN reaction causing immediate rash, facial/tongue/throat swelling, SOB or lightheadedness with hypotension: unknown Has patient had a PCN reaction causing severe rash involving mucus membranes or skin necrosis: unknown Has patient had a PCN reaction that required hospitalization unknown Has patient had a PCN reaction occurring within the last 10 years: unknown If all of the above answers are "NO", then may proceed with Cephalosporin use.  . Ciprofloxacin Hives    Patient Measurements: Height: 5\' 3"  (160 cm) Weight: 217 lb 2.5 oz (98.5 kg) IBW/kg (Calculated) : 52.4 HEPARIN DW (KG): 75.4  Vital Signs: Temp: 97.3 F (36.3 C) (07/25 0807) Temp Source: Oral (07/25 0807) BP: 118/82 (07/25 0900) Pulse Rate: 112 (07/25 0900)  Labs:  Recent Labs  04/08/16 1530 04/08/16 1531 04/08/16 1608 04/08/16 2132 04/09/16 0237 04/09/16 0845  HGB 11.8*  --  13.9  --  10.0*  --   HCT 41.3  --  41.0  --  35.6*  --   PLT 229  --   --   --  202  --   LABPROT  --  16.4*  --   --   --   --   INR  --  1.31  --   --   --   --   HEPARINUNFRC  --   --   --   --   --  0.74*  CREATININE  --  2.37* 2.20*  --  2.05*  --   TROPONINI  --  0.05*  --  0.08* 0.06*  --    Lab 04/08/16 1530 04/08/16 1531 04/08/16 1608 04/08/16 1614 04/08/16 2132 04/09/16 0023 04/09/16 0237 04/09/16 0845  WBC 10.8*  --   --   --   --   --  6.3  --   CREATININE  --  2.37* 2.20*  --   --   --  2.05*  --   LATICACIDVEN  --   --   --  7.62* 2.1* 2.7*  --  1.7     Antimicrobials this admission: 7/24 vanc x 1 7/24 cefepime >>  Microbiology results: 7/24 BCx:  7/24 UCx:  7/24 MRSA PCR: pos   Assessment: 65yo F presents with limb ischemia, h/o PVD s/p R BKA noted, no PTA  anticoagulation. Heparin level supratherapeutic at 0.74 on 1200 units/hr. Hgb 13.9>10.0, Plt WNL, no bleeding documented.   Suspected sepsis. WBC 10.8>6.3, LA 2.1>1.7, afeb, CrCl ~30 ml/min  Goal of Therapy:  Heparin level 0.3-0.7 units/ml Monitor platelets by anticoagulation protocol: Yes   Plan:  -Decrease heparin gtt to 1100 units/hr -Check heparin level in 6 hours and daily while on heparin -Monitor CBC, s/sx of bleeding  -Continue cefepime 2g IV Q24H -Monitor renal fxn, c/s, clinical progress, abx LOT   Thank you for allowing pharmacy to be a part of this patient's care.  Gwenlyn Perking, PharmD PGY1 Pharmacy Resident Pager: [phone] 04/09/2016 10:21 AM

## 2016-04-09 NOTE — Progress Notes (Signed)
CRITICAL VALUE ALERT  Critical value received:  LA  2.7  Date of notification: 04/09/2016  Time of notification:  0054  Critical value read back: yes  Nurse who received alert: Gevena Barre, R MD notified (1st page):  Kathlee Nations, RN to  notify Dr Oletta Darter, CCM  Time of first page:    MD notified (2nd page):  Time of second page:  Responding MD:   Time MD responded:

## 2016-04-10 DIAGNOSIS — G4733 Obstructive sleep apnea (adult) (pediatric): Secondary | ICD-10-CM

## 2016-04-10 DIAGNOSIS — R0902 Hypoxemia: Secondary | ICD-10-CM

## 2016-04-10 DIAGNOSIS — I998 Other disorder of circulatory system: Secondary | ICD-10-CM

## 2016-04-10 LAB — URINE CULTURE: CULTURE: NO GROWTH

## 2016-04-10 LAB — GLUCOSE, CAPILLARY
GLUCOSE-CAPILLARY: 120 mg/dL — AB (ref 65–99)
GLUCOSE-CAPILLARY: 166 mg/dL — AB (ref 65–99)
GLUCOSE-CAPILLARY: 117 mg/dL — AB (ref 65–99)
Glucose-Capillary: 158 mg/dL — ABNORMAL HIGH (ref 65–99)
Glucose-Capillary: 208 mg/dL — ABNORMAL HIGH (ref 65–99)

## 2016-04-10 LAB — CBC
HEMATOCRIT: 32 % — AB (ref 36.0–46.0)
Hemoglobin: 9.3 g/dL — ABNORMAL LOW (ref 12.0–15.0)
MCH: 23.7 pg — ABNORMAL LOW (ref 26.0–34.0)
MCHC: 29.1 g/dL — AB (ref 30.0–36.0)
MCV: 81.4 fL (ref 78.0–100.0)
Platelets: 185 10*3/uL (ref 150–400)
RBC: 3.93 MIL/uL (ref 3.87–5.11)
RDW: 19.5 % — AB (ref 11.5–15.5)
WBC: 5.7 10*3/uL (ref 4.0–10.5)

## 2016-04-10 LAB — BASIC METABOLIC PANEL
Anion gap: 9 (ref 5–15)
BUN: 22 mg/dL — AB (ref 6–20)
CALCIUM: 9 mg/dL (ref 8.9–10.3)
CHLORIDE: 99 mmol/L — AB (ref 101–111)
CO2: 31 mmol/L (ref 22–32)
CREATININE: 1.31 mg/dL — AB (ref 0.44–1.00)
GFR calc non Af Amer: 42 mL/min — ABNORMAL LOW (ref >60)
GFR, EST AFRICAN AMERICAN: 48 mL/min — AB (ref >60)
GLUCOSE: 194 mg/dL — AB (ref 65–99)
Potassium: 4.2 mmol/L (ref 3.5–5.1)
Sodium: 139 mmol/L (ref 135–145)

## 2016-04-10 LAB — PHOSPHORUS: PHOSPHORUS: 2.8 mg/dL (ref 2.5–4.6)

## 2016-04-10 LAB — HEPARIN LEVEL (UNFRACTIONATED): HEPARIN UNFRACTIONATED: 0.37 [IU]/mL (ref 0.30–0.70)

## 2016-04-10 LAB — MAGNESIUM: Magnesium: 1.4 mg/dL — ABNORMAL LOW (ref 1.7–2.4)

## 2016-04-10 MED ORDER — MAGNESIUM SULFATE 50 % IJ SOLN
3.0000 g | Freq: Once | INTRAVENOUS | Status: AC
  Administered 2016-04-10: 3 g via INTRAVENOUS
  Filled 2016-04-10: qty 6

## 2016-04-10 MED ORDER — SALINE SPRAY 0.65 % NA SOLN
1.0000 | NASAL | Status: DC | PRN
  Filled 2016-04-10: qty 44

## 2016-04-10 MED ORDER — CITALOPRAM HYDROBROMIDE 20 MG PO TABS
20.0000 mg | ORAL_TABLET | Freq: Every day | ORAL | Status: DC
  Administered 2016-04-10 – 2016-04-20 (×11): 20 mg via ORAL
  Filled 2016-04-10 (×12): qty 1

## 2016-04-10 MED ORDER — GABAPENTIN 300 MG PO CAPS
300.0000 mg | ORAL_CAPSULE | Freq: Two times a day (BID) | ORAL | Status: DC
  Administered 2016-04-10 – 2016-04-20 (×21): 300 mg via ORAL
  Filled 2016-04-10 (×22): qty 1

## 2016-04-10 MED ORDER — METOPROLOL TARTRATE 25 MG PO TABS
25.0000 mg | ORAL_TABLET | Freq: Two times a day (BID) | ORAL | Status: DC
  Administered 2016-04-10 – 2016-04-13 (×8): 25 mg via ORAL
  Filled 2016-04-10 (×8): qty 1

## 2016-04-10 MED ORDER — FUROSEMIDE 10 MG/ML IJ SOLN
40.0000 mg | Freq: Two times a day (BID) | INTRAMUSCULAR | Status: DC
  Administered 2016-04-10 – 2016-04-11 (×3): 40 mg via INTRAVENOUS
  Filled 2016-04-10 (×4): qty 4

## 2016-04-10 MED ORDER — OXYCODONE-ACETAMINOPHEN 5-325 MG PO TABS
1.0000 | ORAL_TABLET | Freq: Four times a day (QID) | ORAL | Status: DC | PRN
  Administered 2016-04-10 – 2016-04-11 (×4): 1 via ORAL
  Filled 2016-04-10 (×4): qty 1

## 2016-04-10 NOTE — Progress Notes (Signed)
[place] for Heparin Indication: ischemic limb + afib  Allergies  Allergen Reactions  . Codeine Rash  . Penicillins Other (See Comments)    Has patient had a PCN reaction causing immediate rash, facial/tongue/throat swelling, SOB or lightheadedness with hypotension: unknown Has patient had a PCN reaction causing severe rash involving mucus membranes or skin necrosis: unknown Has patient had a PCN reaction that required hospitalization unknown Has patient had a PCN reaction occurring within the last 10 years: unknown If all of the above answers are "NO", then may proceed with Cephalosporin use.  . Ciprofloxacin Hives    Patient Measurements: Height: 5\' 3"  (160 cm) Weight: 224 lb 3.3 oz (101.7 kg) IBW/kg (Calculated) : 52.4 Heparin Dosing Weight: 75.4 kg  Vital Signs: Temp: 97.6 F (36.4 C) (07/26 1204) Temp Source: Oral (07/26 1204) BP: 109/70 (07/26 0800) Pulse Rate: 127 (07/26 0845)  Labs:  Recent Labs  04/08/16 1530  04/08/16 1531 04/08/16 1608 04/08/16 2132 04/09/16 0237 04/09/16 0845 04/09/16 1844 04/10/16 0025 04/10/16 0234  HGB 11.8*  --   --  13.9  --  10.0*  --   --  9.3*  --   HCT 41.3  --   --  41.0  --  35.6*  --   --  32.0*  --   PLT 229  --   --   --   --  202  --   --  185  --   LABPROT  --   --  16.4*  --   --   --   --   --   --   --   INR  --   --  1.31  --   --   --   --   --   --   --   HEPARINUNFRC  --   --   --   --   --   --  0.74* 0.55  --  0.37  CREATININE  --   < > 2.37* 2.20*  --  2.05*  --   --  1.31*  --   TROPONINI  --   --  0.05*  --  0.08* 0.06*  --   --   --   --   < > = values in this interval not displayed.  Estimated Creatinine Clearance: 48.7 mL/min (by C-G formula based on SCr of 1.31 mg/dL).   Medical History: Past Medical History:  Diagnosis Date  . Anxiety   . Arthritis    "back" (10/30/2015)  . CHF (congestive heart failure) ([place])   . Chronic lower back pain   . COPD (chronic  obstructive pulmonary disease) ([place])   . GERD (gastroesophageal reflux disease)   . Hyperlipidemia   . Hypertension   . Hypoxia   . Peripheral vascular disease ([place])   . Shingles Dec. 2016   Scalp  . Stroke ([place])   . Type II diabetes mellitus ([facility])     Medications:  Prescriptions Prior to Admission  Medication Sig Dispense Refill Last Dose  . albuterol (PROVENTIL HFA;VENTOLIN HFA) 108 (90 Base) MCG/ACT inhaler Inhale 2 puffs into the lungs every 6 (six) hours as needed for wheezing or shortness of breath.   PRN  . ALPRAZolam (XANAX) 0.25 MG tablet Take 0.25 mg by mouth 2 (two) times daily.    04/07/2016  . aspirin 325 MG tablet Take 1 tablet (325 mg total) by mouth daily. 30 tablet 0 04/07/2016  . atorvastatin (LIPITOR)  20 MG tablet Take 1 tablet (20 mg total) by mouth daily at 6 PM. 30 tablet 0 04/07/2016  . citalopram (CELEXA) 20 MG tablet Take 20 mg by mouth daily.   04/07/2016  . fluticasone furoate-vilanterol (BREO ELLIPTA) 100-25 MCG/INH AEPB Inhale 1 puff into the lungs daily. 1 each 5 04/06/2016  . furosemide (LASIX) 20 MG tablet Take 20 mg by mouth daily.   04/07/2016  . gabapentin (NEURONTIN) 300 MG capsule Take 300 mg by mouth 2 (two) times daily.    04/07/2016  . insulin aspart (NOVOLOG) 100 UNIT/ML injection Inject 10 Units into the skin 3 (three) times daily before meals.    04/07/2016  . Insulin Glargine (LANTUS SOLOSTAR) 100 UNIT/ML Solostar Pen Inject 30 Units into the skin at bedtime.    04/06/2016  . ipratropium-albuterol (DUONEB) 0.5-2.5 (3) MG/3ML SOLN Take 3 mLs by nebulization every 6 (six) hours as needed (wheezing).    PRN  . lisinopril (PRINIVIL,ZESTRIL) 20 MG tablet Take 1 tablet (20 mg total) by mouth daily. 30 tablet 0 04/07/2016  . metFORMIN (GLUCOPHAGE) 500 MG tablet Take 500 mg by mouth 2 (two) times daily with a meal.   04/07/2016  . metoprolol tartrate (LOPRESSOR) 25 MG tablet Take 25 mg by mouth 2 (two) times daily.    04/07/2016 at pm  . naproxen (NAPROSYN) 500 MG  tablet Take by mouth 2 (two) times daily as needed for mild pain or moderate pain.    PRN  . oxyCODONE-acetaminophen (PERCOCET) 10-325 MG tablet Take 1 tablet by mouth 4 (four) times daily as needed for pain.    PRN  . pantoprazole (PROTONIX) 40 MG tablet Take 40 mg by mouth daily.   04/07/2016  . predniSONE (DELTASONE) 20 MG tablet Take 1 tablet (20 mg total) by mouth daily with breakfast. Take 2 tablets P.O. Daily for 7 days then decrease to 1 tab P.O. Daily for 7 days 21 tablet 1 04/06/2016   Scheduled:  . antiseptic oral rinse  7 mL Mouth Rinse q12n4p  . aspirin  325 mg Oral Daily  . atorvastatin  20 mg Oral q1800  . budesonide (PULMICORT) nebulizer solution  0.5 mg Nebulization BID  . ceFEPime (MAXIPIME) IV  2 g Intravenous Q24H  . Chlorhexidine Gluconate Cloth  6 each Topical Q0600  . citalopram  20 mg Oral Daily  . furosemide  40 mg Intravenous BID  . gabapentin  300 mg Oral BID  . insulin aspart  0-15 Units Subcutaneous Q4H  . ipratropium-albuterol  3 mL Nebulization Q6H  . metoprolol tartrate  25 mg Oral BID  . mupirocin ointment  1 application Nasal BID  . pantoprazole  40 mg Oral Q1200   Infusions:  . sodium chloride 100 mL/hr at 04/10/16 0849  . heparin 1,100 Units/hr (04/10/16 0850)  . phenylephrine (NEO-SYNEPHRINE) Adult infusion Stopped (04/09/16 2050)    Assessment: 65yo F transferred from [facility] presents with limb ischemia, h/o PVD s/p R BKA noted, no PTA anticoagulation  AC/Heme: Afib + Cold LLE. Heparin level therapeutic 0.37. Hgb 13.9>10.0, Plt WNL, no bleeding documented --7/26 AM: RN called and said that pt has slight nosebleed. Pt states she sometimes gets these at home when she wears oxygen.  Goal of Therapy:  Heparin level 0.3-0.7 units/ml Monitor platelets by anticoagulation protocol: Yes   Plan:  -Continue heparin gtt at 1100 units/hr -Continue cefepime 2g IV Q24H. Adjust to q12h when CrCl>50. - No surgery for now.  Alejandro Gamel S. Alford Highland, PharmD,  BCPS Clinical Staff  Pharmacist Pager [phone]  04/10/2016,12:13 PM

## 2016-04-10 NOTE — Progress Notes (Signed)
PULMONARY / CRITICAL CARE MEDICINE   Name: [name] MRN: [id number] DOB: 24-Nov-1950    ADMISSION DATE:  04/08/2016   CONSULTATION DATE:  04/08/16  REFERRING MD:  [facility] - [facility] ED  CHIEF COMPLAINT:  SOB, AMS  HISTORY OF PRESENT ILLNESS:   [name] is a 65 y.o. 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  She presented to [facility] ED 7/24 with AMS, generalized edema, and SOB.  Symptoms started earlier that morning and got to the point where she was cyanotic in her face and lips.  She later developed nausea with 3 episodes of vomiting. Due to the degree of her swelling along with cyanosis, family decided to take her in for evaluation.  In ED, she was found to have mild hypercarbia (7.23 / 64 / 100), AoCKD, lactic acidosis, troponin bump.  She had CT of the head which was unremarkable and CT of the abdomen and pelvis which demonstrated anasarca with mild ascites and small bilateral pleural effusions.  She also had left foot that was cool to touch and had no pulses palpable or dopplerable.  She was subsequently transferred to [facility] for further evaluation and management.  SUBJECTIVE:  Breathing improved   VITAL SIGNS: BP 109/70   Pulse (!) 123   Temp 97.8 F (36.6 C) (Oral)   Resp (!) 0   Ht 5\' 3"  (1.6 m)   Wt 224 lb 3.3 oz (101.7 kg)   SpO2 95%   BMI 39.72 kg/m   HEMODYNAMICS:    VENTILATOR SETTINGS: FiO2 (%):  [40 %] 40 %  INTAKE / OUTPUT: I/O last 3 completed shifts: In: 4901.6 [P.O.:480; I.V.:4315.6; IV Piggyback:106] Out: 3800 [Urine:3800]  PHYSICAL EXAMINATION: General: Chronically ill appearing female, in NAD. Neuro: A&O x 3, non-focal.  HEENT: [place]/AT. PERRL, sclerae anicteric. Cardiovascular: RRR, no M/R/G. Left leg cool, PT and peroneal pulses dopplerable by vascular surgery. Lungs: Respirations even and unlabored.  Faint basilar crackles. Bilateral exp wheezing Abdomen: Abdomen distended and tight.  BS x 4, non-tender. Musculoskeletal: R  BKA.  LLE with 1+ non-pitting edema.  Skin: Intact, no rashes. Left foot cool.  LABS:  BMET  Recent Labs Lab 04/08/16 1531 04/08/16 1608 04/09/16 0237 04/10/16 0025  NA 140 141 140 139  K 4.7 4.4 4.5 4.2  CL 99* 98* 99* 99*  CO2 27  --  33* 31  BUN 33* 32* 32* 22*  CREATININE 2.37* 2.20* 2.05* 1.31*  GLUCOSE 98 90 105* 194*    Electrolytes  Recent Labs Lab 04/08/16 1531 04/09/16 0237 04/10/16 0025  CALCIUM 9.0 9.1 9.0  MG  --  1.5* 1.4*  PHOS  --  4.7* 2.8    CBC  Recent Labs Lab 04/08/16 1530 04/08/16 1608 04/09/16 0237 04/10/16 0025  WBC 10.8*  --  6.3 5.7  HGB 11.8* 13.9 10.0* 9.3*  HCT 41.3 41.0 35.6* 32.0*  PLT 229  --  202 185    Coag's  Recent Labs Lab 04/08/16 1531  INR 1.31    Sepsis Markers  Recent Labs Lab 04/09/16 0845 04/09/16 1156 04/09/16 1844  LATICACIDVEN 1.7 2.7* 2.9*    ABG  Recent Labs Lab 04/08/16 1605  PHART 7.230*  PCO2ART 64.0*  PO2ART 100.00    Liver Enzymes  Recent Labs Lab 04/08/16 1531  AST 34  ALT 19  ALKPHOS 77  BILITOT 1.3*  ALBUMIN 3.9    Cardiac Enzymes  Recent Labs Lab 04/08/16 1531 04/08/16 2132 04/09/16 0237  TROPONINI 0.05* 0.08* 0.06*    Glucose  Recent Labs Lab 04/09/16 1154 04/09/16 1619 04/09/16 1940 04/09/16 2354 04/10/16 0403 04/10/16 0747  GLUCAP 102* 148* 178* 194* 158* 120*    Imaging No results found.   STUDIES:  CT head 7/24 > no acute process. CT A / P 7/24 > anasarca with mild ascites and small bilateral pleural effusions. CXR >   CULTURES: None.  ANTIBIOTICS: None.  SIGNIFICANT EVENTS: 7/24 > admit.  Seen by vascular surgery for cold left leg, + PT and peroneal pulses > no emergent surgical intervention required. 7/26 > A fib overnight, metoprolol added, Lasix for pleural effusion. Add CPAP overnight   LINES/TUBES: R PICC line 7/24>>>  DISCUSSION: 65 y.o. F with multiple medical issues, transferred to [facility] 7/24 due to cold LLE along  with AoCKD.  Seen by vascular on arrival to [facility], PT and peroneal pulses present therefore no emergent surgery needed.  Started on heparin in the meantime. A. Fib overnight (7/25-7/26), Metoprolol added, Neo off this AM. CPAP added for nighttime use. Lasix 40 mg BID added for right pleural effusion.   ASSESSMENT / PLAN:  PULMONARY A: Acute hypoxic and hypercarbic respiratory failure. COPD by report - no PFT's in system. Probable OSA / OHS. Right pleural effusion  P:   CPAP at night Continue supplemental O2 as needed to maintain SpO2 of 88-92%. Budesonide in lieu of outpatient Breo. Albuterol / DuoNebs. Assess ambulatory desaturation study prior to discharge to assess for home O2 needs. (on 2 liters at home) Add Lasix 40 mg BID  CARDIOVASCULAR A:  Cold LLE - seen by vascular surgery this evening, no emergent surgery needed. Troponin leak - suspect demand ischemia. Hx HTN, HLD, CHF (Echo from Dec 2016 with EF 50-55%). A fib with RVR  P:  Continue heparin gtt. Neo for BP support (MAP > 65) Vascular surgery following, appreciate the assistance, no surgery for now. Continue outpatient ASA, atorvastatin. Add Lasix 40 mg IV BID Add Metoprolol 25 mg BID  RENAL A:   AoCKD - on ACEi + NSAID's, also has had vomiting. P:   KVO IVF given pulmonary edema on CXR. Correct electrolytes as indicated. + 1.1 liter for last 24 hours BMP in AM.  GASTROINTESTINAL A:   GERD. Ascites. Nutrition. P:   Continue outpatient PPI. Tolerating regular diet  HEMATOLOGIC A:   VTE Prophylaxis. P:  Heparin gtt. CBC in AM.  INFECTIOUS A:   No indication of infection.  P:   Assess PCT, if high then add empiric abx. Afebrile  Culture if temp > 101.5  ENDOCRINE A:   DM.    P:  CBG AC/HS  SSI. Hold outpatient metformin, novolog, lantus. Add back in Lantus 30 units once taking adequate PO  NEUROLOGIC A:   Hx anxiety, stroke. P:   Limit sedating meds. Low dose fentanyl PRN  pain. Hold outpatient alprazolam Restart Celexa, Percocet and Gabapentin  Family updated: None available.  Interdisciplinary Family Meeting v Palliative Care Meeting:  Due by: 07/31.  Attending Note:  65 year old with extensive PMH who presents with a-fib with RVR and left leg ischemia.  Patient seen by vascular surgery, no indication for surgery at this time.  On exam, diffuse wheezes and bibasilar crackles.  I reviewed CXR myself, right sided pleural effusion noted.  Discussed with PCCM-NP.  Septic shock: resolved, off neo.  - Restart beta blockers.  - Monitor clinically.  OSA:  - CPAP when asleep.  - Needs f/u with  PCCM for sleep study as outpatient.  Hypoxemia: baseline 2L [place] at home.  - Titrate O2 for sat of 88-92%.  Ischemic leg:  - No surgical interventions for now.  - Wound care consult.  A-fib with RVR.  - Heparin for now.  - May need cardiology's input for long term anti-coagulation.  Transfer to SDU and to [facility] with PCCM off 7/27.  Patient seen and examined, agree with above note.  I dictated the care and orders written for this patient under my direction.  Rush Farmer, MD [phone]

## 2016-04-10 NOTE — Progress Notes (Signed)
Night pharmacist made aware of patients minimal nose bleed. Pt states that she gets them at home when she is on oxygen. She is currently on venturi mask at 8L and Heparin gtt at 11cc/hr. No new orders given, but will continue to monitor bleeding.

## 2016-04-10 NOTE — Progress Notes (Signed)
Pt transferred to 3S14.  Report was called to [facility].  Pt transferred on bed and all VS were WNL upon transfer.  Two attempts were made to reach the family, however, There was no answer for her husband's cell, Norma's cell or the home number.

## 2016-04-10 NOTE — Evaluation (Signed)
Physical Therapy Evaluation Patient Details Name: [name] MRN: [id number] DOB: 19-May-1951 Today's Date: 04/10/2016   History of Present Illness  [name] is a 65 y.o. 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  She presented to [facility] ED 7/24 with AMS, generalized edema, and SOB.  Symptoms started earlier that morning and got to the point where she was cyanotic in her face and lips.  She later developed nausea with 3 episodes of vomiting.  In ED, she was found to have mild hypercarbia (7.23 / 64 / 100), AoCKD, lactic acidosis, troponin bump.  She had CT of the head which was unremarkable and CT of the abdomen and pelvis which demonstrated anasarca with mild ascites and small bilateral pleural effusions.  She also had left foot that was cool to touch and had no pulses palpable or dopplerable.  She was subsequently transferred to [facility] for further evaluation and management.  Clinical Impression  Pt admitted with above diagnosis. Pt currently with functional limitations due to the deficits listed below (see PT Problem List). Pt limited to sitting EOB about 3 min today. Desats and needed min to max assist for balance at EOB.  Will most likely need SNF for therapy at d/c.   Pt will benefit from skilled PT to increase their independence and safety with mobility to allow discharge to the venue listed below.      Follow Up Recommendations SNF;Supervision/Assistance - 24 hour    Equipment Recommendations  Other (comment) (TBA)    Recommendations for Other Services       Precautions / Restrictions Precautions Precautions: Fall Precaution Comments: Right AKA Required Braces or Orthoses:  (pt has a right LE prosthesis)      Mobility  Bed Mobility Overal bed mobility: Needs Assistance;+2 for physical assistance Bed Mobility: Supine to Sit;Sit to Supine     Supine to sit: Mod assist;+2 for physical assistance Sit to supine: Mod assist;+2 for physical assistance    General bed mobility comments: Needed assist for LEs and for elevation of trunk to come to EOB.  Needed same assist to lie back down.   Transfers                    Ambulation/Gait                Stairs            Wheelchair Mobility    Modified Rankin (Stroke Patients Only)       Balance Overall balance assessment: Needs assistance;History of Falls Sitting-balance support: Bilateral upper extremity supported;Feet supported Sitting balance-Leahy Scale: Poor Sitting balance - Comments: needed mod assist to sit EOB initially and progressed to min guard to min.  posterior lean at times.Sat EOB a total of 3 min and was SOB with 74% O2 on 4LO2.  Nurse incr pt to 6LO2 to keep sats higher however sats stayed in 70's-80's until PT assisted pt back to supine.  Once in supine, nurse left 6LO2 on and sats 95-100%. Postural control: Posterior lean                                   Pertinent Vitals/Pain Pain Assessment: Faces Faces Pain Scale: Hurts little more Pain Location: generalized Pain Descriptors / Indicators: Aching;Grimacing Pain Intervention(s): Limited activity within patient's tolerance;Monitored during session;Repositioned  Desat to 74% on 4LO2, nursing incr O2 to 6LO2  with activity with sats 85-90%.  Once at rest, pt sats on 6LO2 98-100% with nurse with pt in room allowing her recovery time.  HR 118-132 bpm with activity.     Home Living Family/patient expects to be discharged to:: Private residence Living Arrangements: Spouse/significant other Available Help at Discharge: Family;Available 24 hours/day Type of Home: Mobile home Home Access: Stairs to enter Entrance Stairs-Rails: Right;Left;Can reach both Entrance Stairs-Number of Steps: 2 Home Layout: One level Home Equipment: Wheelchair - manual;Bedside commode;Tub bench (2 3N1's -one beside bed and one over toilet;2LO2 home)      Prior Function Level of Independence: Needs  assistance   Gait / Transfers Assistance Needed: max assist since BKA; pt states her husband gives mod assist for stand pivot transfers.  ADL's / Homemaking Assistance Needed: total assist since BKA   Comments: Pt reports shesees Cyndi at outpt and is working on gait training with prosthesis - just had a new one made as the old prosthesis was broken.     Hand Dominance   Dominant Hand: Right    Extremity/Trunk Assessment   Upper Extremity Assessment: Defer to OT evaluation           Lower Extremity Assessment: Generalized weakness      Cervical / Trunk Assessment: Normal  Communication   Communication: No difficulties  Cognition Arousal/Alertness: Awake/alert Behavior During Therapy: WFL for tasks assessed/performed Overall Cognitive Status: Within Functional Limits for tasks assessed                      General Comments      Exercises General Exercises - Lower Extremity Ankle Circles/Pumps: AROM;Left;5 reps;Seated Long Arc Quad: AROM;Left;10 reps;Seated      Assessment/Plan    PT Assessment Patient needs continued PT services  PT Diagnosis Generalized weakness;Acute pain   PT Problem List Decreased activity tolerance;Decreased balance;Decreased mobility;Decreased knowledge of use of DME;Decreased safety awareness;Decreased strength;Decreased range of motion;Decreased knowledge of precautions;Cardiopulmonary status limiting activity;Decreased skin integrity;Obesity;Pain  PT Treatment Interventions DME instruction;Gait training;Functional mobility training;Therapeutic activities;Therapeutic exercise;Balance training;Patient/family education;Wheelchair mobility training   PT Goals (Current goals can be found in the Care Plan section) Acute Rehab PT Goals Patient Stated Goal: to get better PT Goal Formulation: With patient Time For Goal Achievement: 04/24/16 Potential to Achieve Goals: Good    Frequency Min 3X/week   Barriers to discharge         Co-evaluation               End of Session Equipment Utilized During Treatment: Gait belt;Oxygen Activity Tolerance: Patient limited by fatigue;Patient limited by pain (limited by decr sats with activity) Patient left: in bed;with call bell/phone within reach;with nursing/sitter in room Nurse Communication: Mobility status;Need for lift equipment         Time: 1212-1230 PT Time Calculation (min) (ACUTE ONLY): 18 min   Charges:   PT Evaluation $PT Eval Moderate Complexity: 1 Procedure     PT G CodesDenice Paradise 04-28-2016, 2:20 PM Shaunae Sieloff,PT Acute Rehabilitation [phone] (pager)

## 2016-04-10 NOTE — Progress Notes (Signed)
eLink Physician-Brief Progress Note Patient Name: MELVENE ESKOLA DOB: 1950-10-02 MRN: [id number]   Date of Service  04/10/2016  HPI/Events of Note  Mg++ = 1.4 and Creatinine = 1.31.  eICU Interventions  Will order: 1. Magnesium Sulfate 3 gm IV now.     Intervention Category Major Interventions: Electrolyte abnormality - evaluation and management  Sommer,Steven Eugene 04/10/2016, 1:05 AM

## 2016-04-10 NOTE — Progress Notes (Signed)
eLink Physician-Brief Progress Note Patient Name: [name] DOB: [birth year] MRN: [id number]   Date of Service  04/10/2016  HPI/Events of Note  AFIB with Ventricular rate 100 - 130. Currently 108.  eICU Interventions  Will send 5 AM BMP, Mg++, PO4--- and CBC now.      Intervention Category Major Interventions: Arrhythmia - evaluation and management  Kinleigh Nault Eugene 04/10/2016, 12:04 AM

## 2016-04-11 ENCOUNTER — Inpatient Hospital Stay (HOSPITAL_COMMUNITY): Payer: Medicare Other

## 2016-04-11 LAB — BASIC METABOLIC PANEL
ANION GAP: 9 (ref 5–15)
Anion gap: 7 (ref 5–15)
BUN: 14 mg/dL (ref 6–20)
BUN: 14 mg/dL (ref 6–20)
CHLORIDE: 101 mmol/L (ref 101–111)
CO2: 29 mmol/L (ref 22–32)
CO2: 34 mmol/L — ABNORMAL HIGH (ref 22–32)
CREATININE: 0.95 mg/dL (ref 0.44–1.00)
CREATININE: 1.15 mg/dL — AB (ref 0.44–1.00)
Calcium: 8.9 mg/dL (ref 8.9–10.3)
Calcium: 9.1 mg/dL (ref 8.9–10.3)
Chloride: 96 mmol/L — ABNORMAL LOW (ref 101–111)
GFR calc non Af Amer: >60 mL/min (ref >60)
GFR, EST AFRICAN AMERICAN: 57 mL/min — AB (ref >60)
GFR, EST NON AFRICAN AMERICAN: 49 mL/min — AB (ref >60)
Glucose, Bld: 72 mg/dL (ref 65–99)
Glucose, Bld: 132 mg/dL — ABNORMAL HIGH (ref 65–99)
POTASSIUM: 4 mmol/L (ref 3.5–5.1)
POTASSIUM: 3.9 mmol/L (ref 3.5–5.1)
SODIUM: 139 mmol/L (ref 135–145)
SODIUM: 137 mmol/L (ref 135–145)

## 2016-04-11 LAB — GLUCOSE, CAPILLARY
GLUCOSE-CAPILLARY: 168 mg/dL — AB (ref 65–99)
GLUCOSE-CAPILLARY: 90 mg/dL (ref 65–99)
GLUCOSE-CAPILLARY: 96 mg/dL (ref 65–99)
Glucose-Capillary: 86 mg/dL (ref 65–99)
Glucose-Capillary: 161 mg/dL — ABNORMAL HIGH (ref 65–99)
Glucose-Capillary: 155 mg/dL — ABNORMAL HIGH (ref 65–99)

## 2016-04-11 LAB — CBC
HEMATOCRIT: 34.8 % — AB (ref 36.0–46.0)
HEMOGLOBIN: 9.7 g/dL — AB (ref 12.0–15.0)
MCH: 23 pg — ABNORMAL LOW (ref 26.0–34.0)
MCHC: 27.9 g/dL — AB (ref 30.0–36.0)
MCV: 82.7 fL (ref 78.0–100.0)
Platelets: 207 10*3/uL (ref 150–400)
RBC: 4.21 MIL/uL (ref 3.87–5.11)
RDW: 19.6 % — ABNORMAL HIGH (ref 11.5–15.5)
WBC: 6.6 10*3/uL (ref 4.0–10.5)

## 2016-04-11 LAB — PHOSPHORUS: PHOSPHORUS: 2.8 mg/dL (ref 2.5–4.6)

## 2016-04-11 LAB — HEPARIN LEVEL (UNFRACTIONATED)
HEPARIN UNFRACTIONATED: 0.24 [IU]/mL — AB (ref 0.30–0.70)
Heparin Unfractionated: 0.47 IU/mL (ref 0.30–0.70)

## 2016-04-11 LAB — LACTIC ACID, PLASMA: LACTIC ACID, VENOUS: 1.3 mmol/L (ref 0.5–1.9)

## 2016-04-11 LAB — MAGNESIUM
MAGNESIUM: 1.5 mg/dL — AB (ref 1.7–2.4)
MAGNESIUM: 1.3 mg/dL — AB (ref 1.7–2.4)

## 2016-04-11 MED ORDER — OXYCODONE-ACETAMINOPHEN 5-325 MG PO TABS
1.0000 | ORAL_TABLET | ORAL | Status: DC | PRN
  Administered 2016-04-12 – 2016-04-15 (×10): 1 via ORAL
  Filled 2016-04-11 (×10): qty 1

## 2016-04-11 MED ORDER — FUROSEMIDE 10 MG/ML IJ SOLN
60.0000 mg | Freq: Three times a day (TID) | INTRAMUSCULAR | Status: DC
  Administered 2016-04-11 – 2016-04-12 (×4): 60 mg via INTRAVENOUS
  Filled 2016-04-11 (×3): qty 6

## 2016-04-11 MED ORDER — IPRATROPIUM-ALBUTEROL 0.5-2.5 (3) MG/3ML IN SOLN
3.0000 mL | Freq: Three times a day (TID) | RESPIRATORY_TRACT | Status: DC
  Administered 2016-04-11 – 2016-04-13 (×6): 3 mL via RESPIRATORY_TRACT
  Filled 2016-04-11 (×6): qty 3

## 2016-04-11 MED ORDER — DEXTROSE 5 % IV SOLN
2.0000 g | Freq: Two times a day (BID) | INTRAVENOUS | Status: DC
  Administered 2016-04-11 – 2016-04-12 (×3): 2 g via INTRAVENOUS
  Filled 2016-04-11 (×4): qty 2

## 2016-04-11 NOTE — Progress Notes (Signed)
Pharmacy Antibiotic Note  [name] is a 65 y.o. female admitted on 04/08/2016 with limb ischemia, h/o PVD s/p R BKA. Continues on cefepime. Suspected sepsis. Currently afebrile, WBC WNL. Most recent LA 2.9 on 7/25. CXR 7/27 showing interval improvement in pulm edema with persistent right-sided pleural effusion. BCx NG x3 days.   Plan: -Adjust cefepime to 2g q12h with improved renal fcn -F/U cultures -Monitor renal fcn, clinical progress, LOT     Height: 5\' 3"  (160 cm) Weight: 215 lb 2.7 oz (97.6 kg) IBW/kg (Calculated) : 52.4  Temp (24hrs), Avg:98 F (36.7 C), Min:97.5 F (36.4 C), Max:98.3 F (36.8 C)   Recent Labs Lab 04/08/16 1530 04/08/16 1531 04/08/16 1608  04/09/16 0023 04/09/16 0237 04/09/16 0505 04/09/16 0845 04/09/16 1156 04/09/16 1844 04/10/16 0025 04/11/16 0329  WBC 10.8*  --   --   --   --  6.3  --   --   --   --  5.7 6.6  CREATININE  --  2.37* 2.20*  --   --  2.05*  --   --   --   --  1.31* 0.95  LATICACIDVEN  --   --   --   < > 2.7*  --  2.1* 1.7 2.7* 2.9*  --   --   < > = values in this interval not displayed.  Estimated Creatinine Clearance: 65.7 mL/min (by C-G formula based on SCr of 0.95 mg/dL).    Allergies  Allergen Reactions  . Codeine Rash  . Penicillins Other (See Comments)    Has patient had a PCN reaction causing immediate rash, facial/tongue/throat swelling, SOB or lightheadedness with hypotension: unknown Has patient had a PCN reaction causing severe rash involving mucus membranes or skin necrosis: unknown Has patient had a PCN reaction that required hospitalization unknown Has patient had a PCN reaction occurring within the last 10 years: unknown If all of the above answers are "NO", then may proceed with Cephalosporin use.  . Ciprofloxacin Hives    Antimicrobials this admission: 7/24 vanc x 1 7/24 cefepime >>  Microbiology results: 7/24 BCx: NGTD 7/24 UCx: Negative 7/24 MRSA PCR: pos  Thank you for allowing pharmacy to be a  part of this patient's care.  Stephens November, PharmD Clinical Pharmacist 1:13 PM, 04/11/2016

## 2016-04-11 NOTE — Progress Notes (Signed)
Patient is resting comfortably on 4L [place] humidified O2 with O2  Sat 100%. BIPAP is not needed at this time. RT will continue to monitor as needed.

## 2016-04-11 NOTE — Progress Notes (Signed)
[place] for Heparin Indication: ischemic limb + afib  Allergies  Allergen Reactions  . Codeine Rash  . Penicillins Other (See Comments)    Has patient had a PCN reaction causing immediate rash, facial/tongue/throat swelling, SOB or lightheadedness with hypotension: unknown Has patient had a PCN reaction causing severe rash involving mucus membranes or skin necrosis: unknown Has patient had a PCN reaction that required hospitalization unknown Has patient had a PCN reaction occurring within the last 10 years: unknown If all of the above answers are "NO", then may proceed with Cephalosporin use.  . Ciprofloxacin Hives    Patient Measurements: Height: 5\' 3"  (160 cm) Weight: 224 lb 3.3 oz (101.7 kg) IBW/kg (Calculated) : 52.4 Heparin Dosing Weight: 75.4 kg  Vital Signs: Temp: 98.3 F (36.8 C) (07/27 0000) Temp Source: Oral (07/27 0000) BP: 97/64 (07/27 0000) Pulse Rate: 117 (07/27 0000)  Labs:  Recent Labs  04/08/16 1531  04/08/16 2132 04/09/16 0237  04/09/16 1844 04/10/16 0025 04/10/16 0234 04/11/16 0328 04/11/16 0329  HGB  --   < >  --  10.0*  --   --  9.3*  --   --  9.7*  HCT  --   < >  --  35.6*  --   --  32.0*  --   --  34.8*  PLT  --   --   --  202  --   --  185  --   --  207  LABPROT 16.4*  --   --   --   --   --   --   --   --   --   INR 1.31  --   --   --   --   --   --   --   --   --   HEPARINUNFRC  --   --   --   --   < > 0.55  --  0.37 0.24*  --   CREATININE 2.37*  < >  --  2.05*  --   --  1.31*  --   --  0.95  TROPONINI 0.05*  --  0.08* 0.06*  --   --   --   --   --   --   < > = values in this interval not displayed.  Estimated Creatinine Clearance: 67.2 mL/min (by C-G formula based on SCr of 0.95 mg/dL).  Assessment: 64yo F on heparin for afib and ischemia. Heparin level down to subtherapeutic (0.24) on 1100 units/hr. Hgb low but stable. No further nosebleeds. No issues with line or bleeding reported per  RN.  Goal of Therapy:  Heparin level 0.3-0.7 units/ml Monitor platelets by anticoagulation protocol: Yes   Plan:  -Increase heparin gtt to 1250 units/hr -F/u 6 hr heparin level  Sherlon Handing, PharmD, BCPS Clinical pharmacist, pager [phone] 04/11/2016,4:05 AM

## 2016-04-11 NOTE — Care Management Important Message (Signed)
Important Message  Patient Details  Name: [name] MRN: [id number] Date of Birth: Jan 12, 1951   Medicare Important Message Given:  Yes    Zenon Mayo, RN 04/11/2016, 10:49 AMImportant Message  Patient Details  Name: [name] MRN: [id number] Date of Birth: 1951-01-04   Medicare Important Message Given:  Yes    Zenon Mayo, RN 04/11/2016, 10:47 AM

## 2016-04-11 NOTE — Progress Notes (Signed)
[place] for Heparin Indication: ischemic limb + afib  Allergies  Allergen Reactions  . Codeine Rash  . Penicillins Other (See Comments)    Has patient had a PCN reaction causing immediate rash, facial/tongue/throat swelling, SOB or lightheadedness with hypotension: unknown Has patient had a PCN reaction causing severe rash involving mucus membranes or skin necrosis: unknown Has patient had a PCN reaction that required hospitalization unknown Has patient had a PCN reaction occurring within the last 10 years: unknown If all of the above answers are "NO", then may proceed with Cephalosporin use.  . Ciprofloxacin Hives    Patient Measurements: Height: 5\' 3"  (160 cm) Weight: 215 lb 2.7 oz (97.6 kg) IBW/kg (Calculated) : 52.4 Heparin Dosing Weight: 75.4 kg  Vital Signs: Temp: 98.3 F (36.8 C) (07/27 1143) Temp Source: Oral (07/27 1143) BP: 105/54 (07/27 0800) Pulse Rate: 118 (07/27 0800)  Labs:  Recent Labs  04/08/16 1531  04/08/16 2132 04/09/16 0237  04/10/16 0025 04/10/16 0234 04/11/16 0328 04/11/16 0329 04/11/16 1129  HGB  --   < >  --  10.0*  --  9.3*  --   --  9.7*  --   HCT  --   < >  --  35.6*  --  32.0*  --   --  34.8*  --   PLT  --   --   --  202  --  185  --   --  207  --   LABPROT 16.4*  --   --   --   --   --   --   --   --   --   INR 1.31  --   --   --   --   --   --   --   --   --   HEPARINUNFRC  --   --   --   --   < >  --  0.37 0.24*  --  0.47  CREATININE 2.37*  < >  --  2.05*  --  1.31*  --   --  0.95  --   TROPONINI 0.05*  --  0.08* 0.06*  --   --   --   --   --   --   < > = values in this interval not displayed.  Estimated Creatinine Clearance: 65.7 mL/min (by C-G formula based on SCr of 0.95 mg/dL).  Assessment: 64yo F on heparin for afib and ischemia. HL back within therapeutic range today at 0.47 Hgb low but stable. Plt 209 and stable. No further nosebleeds.  Goal of Therapy:  Heparin level 0.3-0.7  units/ml Monitor platelets by anticoagulation protocol: Yes   Plan:  -Continue heparin gtt at 1250 units/hr -Daily CBC/HL -Monitor s/sx bleeding   Stephens November, PharmD Clinical Pharmacist 1:07 PM, 04/11/2016

## 2016-04-11 NOTE — Care Management Note (Addendum)
Case Management Note  Patient Details  Name: TABYTHA ROHS MRN: [id number] Date of Birth: [birth year]  Subjective/Objective:   Patient is from home with spouse, she has a manual w/chair at home which she uses, and she has home oxygen with [employer] ,she is on 2 liters continuously.  Pt has rec snf for patient, she states she would like to think about it.  She states she has had [place] services with [facility] in the past.  NCM will cont to follow for dc needs.                    Action/Plan:   Expected Discharge Date:                  Expected Discharge Plan:  Skilled Nursing Facility  In-House Referral:  Clinical Social Work  Discharge planning Services  CM Consult  Post Acute Care Choice:    Choice offered to:     DME Arranged:    DME Agency:     HH Arranged:    [place] Agency:     Status of Service:  In process, will continue to follow  If discussed at Long Length of Stay Meetings, dates discussed:    Additional Comments:  Zenon Mayo, RN 04/11/2016, 10:49 AM

## 2016-04-11 NOTE — Progress Notes (Signed)
Physical Therapy Treatment Patient Details Name: [name] MRN: [id number] DOB: 04/26/1951 Today's Date: 04/11/2016    History of Present Illness [name] is a 65 y.o. 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  She presented to [facility] ED 7/24 with AMS, generalized edema, and SOB.  Symptoms started earlier that morning and got to the point where she was cyanotic in her face and lips.  She later developed nausea with 3 episodes of vomiting.  In ED, she was found to have mild hypercarbia (7.23 / 64 / 100), AoCKD, lactic acidosis, troponin bump.  She had CT of the head which was unremarkable and CT of the abdomen and pelvis which demonstrated anasarca with mild ascites and small bilateral pleural effusions.  She also had left foot that was cool to touch and had no pulses palpable or dopplerable.  She was subsequently transferred to [facility] for further evaluation and management.    PT Comments    Pt is mobilizing better today and O2 sats stayed 88% and higher during mobility on 5 L O2 [place].  She was too fearful to transfer to chair using RW, but was able to use the steady standing frame to get to the chair.  Pt would still very much benefit from SNF level rehab if she is agreeable.  PT to follow acutely until d/c confirmed.     Follow Up Recommendations  SNF;Supervision/Assistance - 24 hour     Equipment Recommendations  None recommended by PT    Recommendations for Other Services       Precautions / Restrictions Precautions Precautions: Fall Precaution Comments: pt with R AKA and recent falls and fear of falling.   Restrictions Weight Bearing Restrictions: Yes    Mobility  Bed Mobility Overal bed mobility: Needs Assistance Bed Mobility: Supine to Sit     Supine to sit: Min assist;HOB elevated     General bed mobility comments: Min assist to support trunk during transition to sit EOB.  Min assist at hips to help scoot EOB to get left foot in contact with  floor.   Transfers Overall transfer level: Needs assistance Equipment used: Rolling walker (2 wheeled) Transfers: Sit to/from Stand Sit to Stand: Min assist         General transfer comment: Min assist multiple times, once with RW, twice with steady standing frame.  Pt is fearful to try to turn to chair with RW and is unable to move or weight shift on left foot (left lower leg is stabilized by the bed).  So, we used the steady standing frame to transfer to the recliner chair.  We discussed her ability to scoot from bed into [facility] with the armrest removed from [facility].    Ambulation/Gait             General Gait Details: Pt is currently non-ambulatory and has been going to OP PT to work on getting ambulatory on her newly fixed R LE prosthesis.           Balance Overall balance assessment: Needs assistance Sitting-balance support: Feet supported;Single extremity supported Sitting balance-Leahy Scale: Fair     Standing balance support: Bilateral upper extremity supported Standing balance-Leahy Scale: Poor                      Cognition Arousal/Alertness: Awake/alert Behavior During Therapy: WFL for tasks assessed/performed Overall Cognitive Status: Within Functional Limits for tasks assessed  Exercises General Exercises - Lower Extremity Ankle Circles/Pumps: AROM;Left;10 reps;Seated Long Arc Quad: AROM;Left;10 reps;Seated Heel Slides: AROM;Left;10 reps;Seated (knee flexion in sitting) Hip Flexion/Marching: Other (comment) (pt did not preform siting that her left hip hurts)    General Comments        Pertinent Vitals/Pain Pain Assessment: Faces Faces Pain Scale: Hurts little more Pain Location: left hip Pain Descriptors / Indicators: Grimacing;Guarding Pain Intervention(s): Limited activity within patient's tolerance;Monitored during session;Patient requesting pain meds-RN notified;Repositioned           PT Goals (current goals  can now be found in the care plan section) Acute Rehab PT Goals Patient Stated Goal: to go home Progress towards PT goals: Progressing toward goals    Frequency  Min 3X/week    PT Plan Current plan remains appropriate       End of Session Equipment Utilized During Treatment: Gait belt;Oxygen Activity Tolerance: Patient limited by pain;Patient limited by fatigue Patient left: in chair;with call bell/phone within reach;with family/visitor present     Time: 1100-1136 PT Time Calculation (min) (ACUTE ONLY): 36 min  Charges:  $Therapeutic Activity: 23-37 mins                      [name] B. [place], [place], DPT [phone]   04/11/2016, 11:49 AM

## 2016-04-11 NOTE — Progress Notes (Addendum)
PROGRESS NOTE    [name]  [id number] DOB: 1950/09/20 DOA: 04/08/2016 PCP: Maricela Curet, MD   Brief Narrative: COPD  65 y.o. 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 COPD/Chronic Respiratory Failure on 2 L [place] at home. Diabetes type 2 uncontrolled with complications  She presented to [facility] ED 7/24 with AMS, generalized edema, and SOB.  Symptoms started earlier that morning and got to the point where she was cyanotic in her face and lips.  She later developed nausea with 3 episodes of vomiting. Due to the degree of her swelling along with cyanosis, family decided to take her in for evaluation.  In ED, she was found to have mild hypercarbia (7.23 / 64 / 100), AoCKD, lactic acidosis, troponin bump.  She had CT of the head which was unremarkable and CT of the abdomen and pelvis which demonstrated anasarca with mild ascites and small bilateral pleural effusions.  She also had left foot that was cool to touch and had no pulses palpable or dopplerable.  She was subsequently transferred to [facility] for further evaluation and management.   Subjective: 7/27 A/O 4, patient states unknown dry weight. Does not weigh yourself daily. Unsure of cardiologist.   Assessment & Plan:   Active Problems:   Metabolic acidosis   Altered mental state   Pleural effusion   Dilated cardiomyopathy (HCC)   Pulmonary hypertension (HCC)   Acute on chronic renal failure (HCC)   Uncontrolled type 2 diabetes mellitus with complication (HCC)   Anasarca   Acute diastolic heart failure (HCC)   PAF (paroxysmal atrial fibrillation) (HCC)   Atrial flutter (HCC)   Anemia, unspecified   CAD in native artery   Acute on chronic respiratory failure (HCC)   Acute on chronic diastolic CHF (congestive heart failure) (HCC)   Panlobular emphysema (HCC)   OSA on CPAP   Obesity hypoventilation syndrome ([place])  Sepsis  Acute hypoxic and hypercarbic respiratory failure. -supplemental O2 as  needed to maintain SpO2 of 88-92%. -Budesonide in lieu of outpatient Breo. -Albuterol / DuoNebs.  COPD by report - no PFT's in system. -See acute respiratory failure with hypoxia  Probable OSA / OHS. -CPAP at night  Right pleural effusion -Secondary to fluid overload  Cold LLE  - seen by vascular surgery this evening, no emergent surgery needed.  Dilated cardiomyopathy/Pulmonary hypertension -Strict in and out -Daily weight Filed Weights   04/18/16 0450 04/19/16 0123 04/20/16 0625  Weight: 77.8 kg (171 lb 8.3 oz) 78.9 kg (173 lb 15.1 oz) 82.6 kg (182 lb 1.6 oz)  -Lasix 60 mg TID -Metoprolol 25 mg BID  A fib with RVR  Continue heparin gtt.  Acute on chronic renal failure  -Multifactorial Cardiomyopathy + ACEi + NSAID's,+vomiting. -Patient grossly fluid overloaded   -KVO IVF given pulmonary edema on CXR.  DM type 2 uncontrolled with complications -[id number] hemoglobin A1c= 8.6 -Moderate SSI  Hx anxiety, stroke.   DVT prophylaxis: Heparin drip Code Status: Full  Family Communication: None Disposition Plan: Completion of diuresis   Consultants:    Procedures/Significant Events:  CT head 7/24 > no acute process. CT A / P 7/24 > anasarca with mild ascites and small bilateral pleural effusions. CXR >   7/24 > admit.  Seen by vascular surgery for cold left leg, + PT and peroneal pulses > no emergent surgical intervention required. 7/25 echocardiogram:- LVEF  50%- 55%. Hypokinesis of the anteroseptal and inferoseptal myocardium.  - Right atrium: moderately dilated. -  Pulmonary arteries: PA  peak pressure: 68 mm Hg (S).-- Inferior vena cava: The vessel was dilated.  7/26 > A fib overnight, metoprolol added, Lasix for pleural effusion. Add CPAP overnight    Cultures   Antimicrobials: Cefepime   Devices    LINES / TUBES:      Continuous Infusions:    Objective: Vitals:   04/19/16 2222 04/20/16 0625 04/20/16 0832 04/20/16 1350  BP: (!) 141/106 (!)  154/65  (!) 120/58  Pulse: 78 (!) 118  76  Resp: 16 18  18   Temp: 98.2 F (36.8 C) 98.7 F (37.1 C)  98.7 F (37.1 C)  TempSrc: Oral Oral  Oral  SpO2: 96% 90% 91% 95%  Weight:  82.6 kg (182 lb 1.6 oz)    Height:       No intake or output data in the 24 hours ending 05/18/16 1736 Filed Weights   04/18/16 0450 04/19/16 0123 04/20/16 0625  Weight: 77.8 kg (171 lb 8.3 oz) 78.9 kg (173 lb 15.1 oz) 82.6 kg (182 lb 1.6 oz)    Examination:  General: A/O 4, acute on chronic respiratory distress Eyes: negative scleral hemorrhage, negative anisocoria, negative icterus ENT: Negative Runny nose, negative gingival bleeding, Neck:  Negative scars, masses, torticollis, lymphadenopathy, JVD Lungs: distant decreased breath sounds bilateral, negative wheezes or crackles Cardiovascular: Irregular irregular rhythm and rate, without murmur gallop or rub normal S1 and S2 Abdomen: Morbidly obese, negative abdominal pain, positive distention, positive soft, bowel sounds, no rebound, positive ascites, no appreciable mass Extremities: anasarca, right AKA Skin: Negative rashes, lesions, ulcers Psychiatric:  Negative depression, negative anxiety, negative fatigue, negative mania  Central nervous system:  Cranial nerves II through XII intact, tongue/uvula midline, all extremities muscle strength 5/5, sensation intact throughout, negative dysarthria, negative expressive aphasia, negative receptive aphasia. .     Data Reviewed: Care during the described time interval was provided by me .  I have reviewed this patient's available data, including medical history, events of note, physical examination, and all test results as part of my evaluation. I have personally reviewed and interpreted all radiology studies.  CBC:  Recent Labs Lab 05/13/16 2030 05/14/16 0557  WBC 11.7* 9.5  NEUTROABS 7.9*  --   HGB 9.9* 9.5*  HCT 33.0* 31.2*  MCV 84.8 84.3  PLT 304 [id number]   Basic Metabolic Panel:  Recent Labs Lab  05/13/16 2030 05/14/16 0557 05/15/16 0605 05/16/16 0548  NA 131* 134* 130* 133*  K 3.7 3.8 4.4 4.1  CL 89* 92* 88* 90*  CO2 32 34* 30 32  GLUCOSE 165* 107* 399* 366*  BUN 24* 22* 36* 37*  CREATININE 1.31* 1.12* 1.32* 1.18*  CALCIUM 9.2 9.4 9.8 9.3   GFR: Estimated Creatinine Clearance: 48.4 mL/min (by C-G formula based on SCr of 1.18 mg/dL). Liver Function Tests:  Recent Labs Lab 05/14/16 0557  AST 20  ALT 12*  ALKPHOS 66  BILITOT 0.8  PROT 7.1  ALBUMIN 3.9   No results for input(s): LIPASE, AMYLASE in the last 168 hours. No results for input(s): AMMONIA in the last 168 hours. Coagulation Profile:  Recent Labs Lab 05/13/16 2030  INR 1.37   Cardiac Enzymes:  Recent Labs Lab 05/13/16 2030  TROPONINI <0.03   BNP (last 3 results) No results for input(s): PROBNP in the last 8760 hours. HbA1C: No results for input(s): HGBA1C in the last 72 hours. CBG:  Recent Labs Lab 05/15/16 0810 05/15/16 1144 05/15/16 1600 05/15/16 2020 05/16/16 0822  GLUCAP 400*  386* 274* 308* 445*   Lipid Profile: No results for input(s): CHOL, HDL, LDLCALC, TRIG, CHOLHDL, LDLDIRECT in the last 72 hours. Thyroid Function Tests: No results for input(s): TSH, T4TOTAL, FREET4, T3FREE, THYROIDAB in the last 72 hours. Anemia Panel: No results for input(s): VITAMINB12, FOLATE, FERRITIN, TIBC, IRON, RETICCTPCT in the last 72 hours. Urine analysis:    Component Value Date/Time   COLORURINE YELLOW 04/19/2016 0140   APPEARANCEUR CLOUDY (A) 04/19/2016 0140   LABSPEC 1.040 (H) 04/19/2016 0140   PHURINE 6.5 04/19/2016 0140   GLUCOSEU 250 (A) 04/19/2016 0140   HGBUR TRACE (A) 04/19/2016 0140   BILIRUBINUR SMALL (A) 04/19/2016 0140   KETONESUR NEGATIVE 04/19/2016 0140   PROTEINUR 30 (A) 04/19/2016 0140   UROBILINOGEN 1.0 04/12/2015 0915   NITRITE NEGATIVE 04/19/2016 0140   LEUKOCYTESUR NEGATIVE 04/19/2016 0140   Sepsis Labs: @LABRCNTIP (procalcitonin:4,lacticidven:4)  ) Recent  Results (from the past 240 hour(s))  MRSA PCR Screening     Status: None   Collection Time: 05/14/16  9:35 PM  Result Value Ref Range Status   MRSA by PCR NEGATIVE NEGATIVE Final    Comment:        The GeneXpert MRSA Assay (FDA approved for NASAL specimens only), is one component of a comprehensive MRSA colonization surveillance program. It is not intended to diagnose MRSA infection nor to guide or monitor treatment for MRSA infections.          Radiology Studies: No results found.      Scheduled Meds:  Continuous Infusions:    LOS: 12 days    Time spent: 40 minutes    Emmalea Treanor, Geraldo Docker, MD [facility] Pager [phone]   If 7PM-7AM, please contact night-coverage [email or web address] Password [facility] 05/18/2016, 5:36 PM

## 2016-04-12 LAB — CBC WITH DIFFERENTIAL/PLATELET
Basophils Absolute: 0 10*3/uL (ref 0.0–0.1)
Basophils Relative: 0 %
Eosinophils Absolute: 0.1 10*3/uL (ref 0.0–0.7)
Eosinophils Relative: 2 %
HCT: 33.3 % — ABNORMAL LOW (ref 36.0–46.0)
Hemoglobin: 9.6 g/dL — ABNORMAL LOW (ref 12.0–15.0)
Lymphocytes Relative: 25 %
Lymphs Abs: 1.4 10*3/uL (ref 0.7–4.0)
MCH: 23.3 pg — ABNORMAL LOW (ref 26.0–34.0)
MCHC: 28.8 g/dL — ABNORMAL LOW (ref 30.0–36.0)
MCV: 80.8 fL (ref 78.0–100.0)
Monocytes Absolute: 0.8 10*3/uL (ref 0.1–1.0)
Monocytes Relative: 14 %
Neutro Abs: 3.1 10*3/uL (ref 1.7–7.7)
Neutrophils Relative %: 59 %
Platelets: 186 10*3/uL (ref 150–400)
RBC: 4.12 MIL/uL (ref 3.87–5.11)
RDW: 19.8 % — ABNORMAL HIGH (ref 11.5–15.5)
WBC: 5.4 10*3/uL (ref 4.0–10.5)

## 2016-04-12 LAB — BASIC METABOLIC PANEL
Anion gap: 9 (ref 5–15)
BUN: 13 mg/dL (ref 6–20)
CALCIUM: 9.6 mg/dL (ref 8.9–10.3)
CHLORIDE: 94 mmol/L — AB (ref 101–111)
CO2: 36 mmol/L — ABNORMAL HIGH (ref 22–32)
CREATININE: 1.05 mg/dL — AB (ref 0.44–1.00)
GFR, EST NON AFRICAN AMERICAN: 55 mL/min — AB (ref >60)
Glucose, Bld: 104 mg/dL — ABNORMAL HIGH (ref 65–99)
Potassium: 3.7 mmol/L (ref 3.5–5.1)
SODIUM: 139 mmol/L (ref 135–145)

## 2016-04-12 LAB — GLUCOSE, CAPILLARY
GLUCOSE-CAPILLARY: 98 mg/dL (ref 65–99)
Glucose-Capillary: 100 mg/dL — ABNORMAL HIGH (ref 65–99)
Glucose-Capillary: 66 mg/dL (ref 65–99)
Glucose-Capillary: 123 mg/dL — ABNORMAL HIGH (ref 65–99)
Glucose-Capillary: 142 mg/dL — ABNORMAL HIGH (ref 65–99)
Glucose-Capillary: 148 mg/dL — ABNORMAL HIGH (ref 65–99)
Glucose-Capillary: 153 mg/dL — ABNORMAL HIGH (ref 65–99)

## 2016-04-12 LAB — HEPARIN LEVEL (UNFRACTIONATED): HEPARIN UNFRACTIONATED: 0.31 [IU]/mL (ref 0.30–0.70)

## 2016-04-12 LAB — MAGNESIUM: MAGNESIUM: 1.2 mg/dL — AB (ref 1.7–2.4)

## 2016-04-12 MED ORDER — MAGNESIUM SULFATE 50 % IJ SOLN
3.0000 g | Freq: Once | INTRAVENOUS | Status: AC
  Administered 2016-04-12: 3 g via INTRAVENOUS
  Filled 2016-04-12: qty 6

## 2016-04-12 MED ORDER — POTASSIUM CHLORIDE CRYS ER 20 MEQ PO TBCR
20.0000 meq | EXTENDED_RELEASE_TABLET | Freq: Once | ORAL | Status: AC
  Administered 2016-04-12: 20 meq via ORAL
  Filled 2016-04-12: qty 1

## 2016-04-12 MED ORDER — FUROSEMIDE 10 MG/ML IJ SOLN
60.0000 mg | Freq: Every day | INTRAMUSCULAR | Status: DC
  Administered 2016-04-13: 60 mg via INTRAVENOUS
  Filled 2016-04-12: qty 6

## 2016-04-12 MED ORDER — HEPARIN SODIUM (PORCINE) 5000 UNIT/ML IJ SOLN
5000.0000 [IU] | Freq: Three times a day (TID) | INTRAMUSCULAR | Status: DC
  Administered 2016-04-12 – 2016-04-13 (×3): 5000 [IU] via SUBCUTANEOUS
  Filled 2016-04-12 (×3): qty 1

## 2016-04-12 MED ORDER — DIGOXIN 0.25 MG/ML IJ SOLN
0.1250 mg | Freq: Every day | INTRAMUSCULAR | Status: DC
  Administered 2016-04-13 – 2016-04-16 (×4): 0.125 mg via INTRAVENOUS
  Filled 2016-04-12 (×5): qty 2

## 2016-04-12 MED ORDER — DIGOXIN 0.25 MG/ML IJ SOLN
0.2500 mg | Freq: Once | INTRAMUSCULAR | Status: AC
  Administered 2016-04-12: 0.25 mg via INTRAVENOUS
  Filled 2016-04-12: qty 2

## 2016-04-12 NOTE — Progress Notes (Signed)
ANTICOAGULATION CONSULT NOTE - Follow Up Consult  Pharmacy Consult for Heparin Indication: atrial fibrillation and ischemic limb  Allergies  Allergen Reactions  . Codeine Rash  . Penicillins Other (See Comments)    Has patient had a PCN reaction causing immediate rash, facial/tongue/throat swelling, SOB or lightheadedness with hypotension: unknown Has patient had a PCN reaction causing severe rash involving mucus membranes or skin necrosis: unknown Has patient had a PCN reaction that required hospitalization unknown Has patient had a PCN reaction occurring within the last 10 years: unknown If all of the above answers are "NO", then may proceed with Cephalosporin use.  . Ciprofloxacin Hives    Patient Measurements: Height: 5\' 3"  (160 cm) Weight: 209 lb 14.1 oz (95.2 kg) IBW/kg (Calculated) : 52.4 Heparin Dosing Weight: 75 kg  Vital Signs: Temp: 97.9 F (36.6 C) (07/28 1100) Temp Source: Oral (07/28 1100) BP: 97/65 (07/28 1100) Pulse Rate: 118 (07/28 1100)  Labs:  Recent Labs  04/10/16 0025  04/11/16 0328 04/11/16 0329 04/11/16 1129 04/11/16 1836 04/12/16 0544  HGB 9.3*  --   --  9.7*  --   --  9.6*  HCT 32.0*  --   --  34.8*  --   --  33.3*  PLT 185  --   --  207  --   --  186  HEPARINUNFRC  --   < > 0.24*  --  0.47  --  0.31  CREATININE 1.31*  --   --  0.95  --  1.15* 1.05*  < > = values in this interval not displayed.  Estimated Creatinine Clearance: 58.6 mL/min (by C-G formula based on SCr of 1.05 mg/dL).  Assessment:  Continues on heparin for afib and left limb ischemia.  Heparin level is low therapeutic (0.31) on 1250 units/hr; trended down from 0.47 on same rate yesterday.  RN reports no further nosebleeds. On humidified O2.   No vascular surgery planned; continues on heparin for afib, per d/w Dr. Sherral Hammers.  On ASA 325 mg daily as prior to admission.  Goal of Therapy:  Heparin level 0.3-0.7 units/ml Monitor platelets by anticoagulation protocol: Yes   Plan:    Increase heparin drip from 1250 to 1350 units/hr, to try to keep heparin level in target range.  Next heparin level and CBC in am.  Monitor for any bleeding.  Arty Baumgartner, [place] Pager: [phone] 04/12/2016,2:34 PM

## 2016-04-12 NOTE — Progress Notes (Signed)
Physical Therapy Treatment Patient Details Name: [name] MRN: [id number] DOB: Nov 29, 1950 Today's Date: 04/12/2016    History of Present Illness [name] is a 65 y.o. 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  She presented to [facility] ED 7/24 with AMS, generalized edema, and SOB.  Symptoms started earlier that morning and got to the point where she was cyanotic in her face and lips.  She later developed nausea with 3 episodes of vomiting.  In ED, she was found to have mild hypercarbia (7.23 / 64 / 100), AoCKD, lactic acidosis, troponin bump.  She had CT of the head which was unremarkable and CT of the abdomen and pelvis which demonstrated anasarca with mild ascites and small bilateral pleural effusions.  She also had left foot that was cool to touch and had no pulses palpable or dopplerable.  She was subsequently transferred to [facility] for further evaluation and management.    PT Comments    Pt is progressing with her mobility.  She is a bit stronger today and was able to transfer to the chair with RW instead of standing frame.  She is still very shaky and weak on her left foot.  She is still appropriate for SNF level rehab at discharge.    Follow Up Recommendations  SNF;Supervision/Assistance - 24 hour     Equipment Recommendations  None recommended by PT    Recommendations for Other Services   NA     Precautions / Restrictions Precautions Precautions: Fall Precaution Comments: pt with h/o falls, R AKA Restrictions Weight Bearing Restrictions: No    Mobility  Bed Mobility Overal bed mobility: Needs Assistance Bed Mobility: Supine to Sit     Supine to sit: Supervision;HOB elevated     General bed mobility comments: Supervision for safety, pt with HOB elevated and using bed rail for leverage  Transfers Overall transfer level: Needs assistance Equipment used: Rolling walker (2 wheeled) Transfers: Sit to/from [facility] Sit to Stand:  Min assist Stand pivot transfers: Min assist       General transfer comment: Min assist to support trunk during transition to stand.  Pt relying on bed for support on left leg to power up and for balance.  PT stabilizing RW.  Pt slowly and very carefully pivoted on left foot to recliner chair with RW.  She wanted PT to stabilize RW.  Verbal cues for safe hand placement and slow descent to sit.  Pt did not quite get turned all the way around before she had to sit, but sat slowly enough that it did not matter.  She is stronger than yesterday, but still very weak.           Balance Overall balance assessment: Needs assistance Sitting-balance support: Feet supported;Single extremity supported;Bilateral upper extremity supported;No upper extremity supported Sitting balance-Leahy Scale: Fair     Standing balance support: Bilateral upper extremity supported Standing balance-Leahy Scale: Poor                      Cognition Arousal/Alertness: Awake/alert Behavior During Therapy: WFL for tasks assessed/performed Overall Cognitive Status: Within Functional Limits for tasks assessed                      Exercises General Exercises - Lower Extremity Ankle Circles/Pumps: AROM;Left;20 reps Long Arc Quad: AROM;Left;10 reps Hip Flexion/Marching: AROM;Both;10 reps;Seated    General Comments        Pertinent  Vitals/Pain Pain Assessment: Faces Faces Pain Scale: Hurts little more Pain Location: low back per pt report Pain Descriptors / Indicators: Grimacing;Guarding Pain Intervention(s): Limited activity within patient's tolerance;Monitored during session           PT Goals (current goals can now be found in the care plan section) Acute Rehab PT Goals Patient Stated Goal: to go home Progress towards PT goals: Progressing toward goals    Frequency  Min 3X/week    PT Plan Current plan remains appropriate       End of Session Equipment Utilized During Treatment:  Gait belt;Oxygen Activity Tolerance: No increased pain;Patient limited by fatigue Patient left: in chair;with call bell/phone within reach     Time: 1434-1456 PT Time Calculation (min) (ACUTE ONLY): 22 min  Charges:  $Therapeutic Activity: 8-22 mins                      Milda Lindvall B. [place], [place], DPT [phone]   04/12/2016, 3:04 PM

## 2016-04-12 NOTE — NC FL2 (Signed)
[place] LEVEL OF CARE SCREENING TOOL     IDENTIFICATION  Patient Name: [name] Birthdate: 1951/02/28 Sex: female Admission Date (Current Location): 04/08/2016  [facility] and [place] Number:  [employer] and Address:  [facility], [place] [address] [place], [place] [address]      Provider Number: [id number]  Attending Physician Name and Address:  Allie Bossier, MD  Relative Name and Phone Number:       Current Level of Care: Hospital Recommended Level of Care: [place] Prior Approval Number:    Date Approved/Denied:   PASRR Number: [id number]  Discharge Plan: SNF    Current Diagnoses: Patient Active Problem List   Diagnosis Date Noted  . Metabolic acidosis 99991111  . Altered mental state 04/08/2016  . COPD exacerbation ([place]) 03/06/2016  . COPD with acute exacerbation ([place]) 01/08/2016  . Acute respiratory failure with hypoxia ([place]) 01/08/2016  . Sepsis ([place]) 11/14/2015  . Pressure ulcer 11/14/2015  . Hypoxia 11/03/2015  . CHF (congestive heart failure) ([place]) 11/03/2015  . S/P AKA (above knee amputation) unilateral ([place])   . Abnormality of gait   . Acute blood loss anemia   . Postoperative pain of extremity   . Phantom limb pain ([place])   . Essential hypertension   . Chronic obstructive pulmonary disease ([place])   . Chronic low back pain   . Poorly controlled type 2 diabetes mellitus with peripheral neuropathy ([place])   . PVD (peripheral vascular disease) ([place])   . Respiratory rate decreased   . Leukocytosis   . Atherosclerosis of right leg ([place]) 10/30/2015  . Pulmonary edema 08/18/2015  . CHF exacerbation ([place]) 08/18/2015  . SOB (shortness of breath) 08/18/2015  . Open wound of knee, leg (except thigh), and ankle 08/18/2015  . Abnormal echocardiogram   . SVT (supraventricular tachycardia) ([place])   . Lower limb ischemia 04/09/2015  . Critical lower limb ischemia 04/09/2015  . AKI (acute kidney  injury) ([place]) 04/09/2015  . Hyperkalemia 04/09/2015  . Pancreatitis 12/26/2013  . Tachycardia 12/26/2013  . Dehydration 12/26/2013  . Vomiting 12/26/2013  . COPD (chronic obstructive pulmonary disease) ([place]) 12/26/2013  . Essential hypertension, benign 12/26/2013  . Diabetes ([place]) 12/26/2013  . Hypomagnesemia 12/26/2013    Orientation RESPIRATION BLADDER Height & Weight     Self, Time, Situation, Place  O2 (Nasal Canula 4 L) Continent, Indwelling catheter Weight: 209 lb 14.1 oz (95.2 kg) Height:  5\' 3"  (160 cm)  BEHAVIORAL SYMPTOMS/MOOD NEUROLOGICAL BOWEL NUTRITION STATUS   (None)  (Altered mental state) Continent Diet (Heart healthy/carb-modified)  AMBULATORY STATUS COMMUNICATION OF NEEDS Skin   Extensive Assist Verbally Other (Comment) (Ecchymosis)                       Personal Care Assistance Level of Assistance  Bathing, Feeding, Dressing Bathing Assistance: Limited assistance Feeding assistance: Independent Dressing Assistance: Limited assistance     Functional Limitations Info  Sight, Hearing, Speech Sight Info: Adequate Hearing Info: Adequate Speech Info: Adequate    SPECIAL CARE FACTORS FREQUENCY  PT (By licensed PT), Blood pressure, Diabetic urine testing     PT Frequency: 5 x week              Contractures Contractures Info: Not present    Additional Factors Info  Code Status, Allergies, Isolation Precautions Code Status Info: Full Allergies Info: Codeine, Penicillins, Ciprofloxacin     Isolation Precautions Info: Contact: MRSA  Current Medications (04/12/2016):  This is the current hospital active medication list Current Facility-Administered Medications  Medication Dose Route Frequency Provider Last Rate Last Dose  . 0.9 %  sodium chloride infusion  250 mL Intravenous PRN Rahul P Desai, PA-C      . 0.9 %  sodium chloride infusion   Intravenous Continuous Praveen Mannam, MD 100 mL/hr at 04/11/16 0500    . albuterol (PROVENTIL) (2.5  MG/3ML) 0.083% nebulizer solution 2.5 mg  2.5 mg Nebulization Q3H PRN Rahul P Desai, PA-C      . antiseptic oral rinse (CPC / CETYLPYRIDINIUM CHLORIDE 0.05%) solution 7 mL  7 mL Mouth Rinse q12n4p Arvilla Meres Early, MD   7 mL at 04/11/16 1600  . aspirin tablet 325 mg  325 mg Oral Daily Rahul P Desai, PA-C   325 mg at 04/12/16 0925  . atorvastatin (LIPITOR) tablet 20 mg  20 mg Oral q1800 Rahul P Desai, PA-C   20 mg at 04/11/16 1800  . budesonide (PULMICORT) nebulizer solution 0.5 mg  0.5 mg Nebulization BID Rahul P Desai, PA-C   0.5 mg at 04/12/16 0943  . ceFEPIme (MAXIPIME) 2 g in dextrose 5 % 50 mL IVPB  2 g Intravenous Q12H Allie Bossier, MD   2 g at 04/12/16 0212  . Chlorhexidine Gluconate Cloth 2 % PADS 6 each  6 each Topical Q0600 Collene Gobble, MD   6 each at 04/12/16 1000  . citalopram (CELEXA) tablet 20 mg  20 mg Oral Daily Erick Colace, NP   20 mg at 04/12/16 [id number]  . furosemide (LASIX) injection 60 mg  60 mg Intravenous TID Allie Bossier, MD   60 mg at 04/12/16 0925  . gabapentin (NEURONTIN) capsule 300 mg  300 mg Oral BID Erick Colace, NP   300 mg at 04/12/16 0925  . heparin ADULT infusion 100 units/mL (25000 units/239mL sodium chloride 0.45%)  1,250 Units/hr Intravenous Continuous Franky Macho, RPH 12.5 mL/hr at 04/12/16 0600 1,250 Units/hr at 04/12/16 0600  . insulin aspart (novoLOG) injection 0-15 Units  0-15 Units Subcutaneous Q4H Rahul P Desai, PA-C   2 Units at 04/12/16 0925  . ipratropium-albuterol (DUONEB) 0.5-2.5 (3) MG/3ML nebulizer solution 3 mL  3 mL Nebulization TID Allie Bossier, MD   3 mL at 04/12/16 0941  . metoprolol tartrate (LOPRESSOR) tablet 25 mg  25 mg Oral BID Erick Colace, NP   25 mg at 04/12/16 0926  . mupirocin ointment (BACTROBAN) 2 % 1 application  1 application Nasal BID Collene Gobble, MD   1 application at [id number] 1000  . oxyCODONE-acetaminophen (PERCOCET/ROXICET) 5-325 MG per tablet 1 tablet  1 tablet Oral Q4H PRN Allie Bossier, MD      .  pantoprazole (PROTONIX) EC tablet 40 mg  40 mg Oral Q1200 Rahul P Desai, PA-C   40 mg at 04/11/16 1200  . pneumococcal 13-valent conjugate vaccine (PREVNAR 13) injection 0.5 mL  0.5 mL Intramuscular Prior to discharge Collene Gobble, MD      . sodium chloride (OCEAN) 0.65 % nasal spray 1 spray  1 spray Each Nare PRN Erick Colace, NP         Discharge Medications: Please see discharge summary for a list of discharge medications.  Relevant Imaging Results:  Relevant Lab Results:   Additional Information SS#: [id number]  Candie Chroman, LCSW

## 2016-04-12 NOTE — Progress Notes (Signed)
Hypoglycemic Event  CBG: 66  Treatment: 15 GM carbohydrate snack  Symptoms:   Follow-up CBG: Time0500 CBG Result:98  Possible Reasons for Event: Unknown  Comments/MD notifiedpending    Gordy Levan

## 2016-04-12 NOTE — Clinical Social Work Placement (Signed)
   CLINICAL SOCIAL WORK PLACEMENT  NOTE  Date:  04/12/2016  Patient Details  Name: [name] MRN: [id number] Date of Birth: [birth year]  Clinical Social Work is seeking post-discharge placement for this patient at the [place] level of care (*CSW will initial, date and re-position this form in  chart as items are completed):  Yes   Patient/family provided with [place] Work Department's list of facilities offering this level of care within the geographic area requested by the patient (or if unable, by the patient's family).  Yes   Patient/family informed of their freedom to choose among providers that offer the needed level of care, that participate in Medicare, Medicaid or managed care program needed by the patient, have an available bed and are willing to accept the patient.  Yes   Patient/family informed of [place]'s ownership interest in [facility] and [facility], as well as of the fact that they are under no obligation to receive care at these facilities.  PASRR submitted to EDS on 04/12/16     PASRR number received on       Existing PASRR number confirmed on 04/12/16     FL2 transmitted to all facilities in geographic area requested by pt/family on 04/12/16     FL2 transmitted to all facilities within larger geographic area on       Patient informed that his/her managed care company has contracts with or will negotiate with certain facilities, including the following:            Patient/family informed of bed offers received.  Patient chooses bed at       Physician recommends and patient chooses bed at      Patient to be transferred to   on  .  Patient to be transferred to facility by       Patient family notified on   of transfer.  Name of family member notified:        PHYSICIAN Please sign FL2     Additional Comment:    _______________________________________________ Candie Chroman, LCSW 04/12/2016, 11:50  AM

## 2016-04-12 NOTE — Clinical Social Work Note (Signed)
Clinical Social Work Assessment  Patient Details  Name: [name] MRN: [id number] Date of Birth: Sep 11, 1951  Date of referral:  04/11/16               Reason for consult:  Facility Placement, Discharge Planning                Permission sought to share information with:  Facility Sport and exercise psychologist, Family Supports Permission granted to share information::  Yes, Verbal Permission Granted  Name::     [name]  Agency::  SNF's  Relationship::  Spouse  Contact Information:  [phone]  Housing/Transportation Living arrangements for the past 2 months:  Mobile Home Source of Information:  Patient, Medical Team Patient Interpreter Needed:  None Criminal Activity/Legal Involvement Pertinent to Current Situation/Hospitalization:  No - Comment as needed Significant Relationships:  Adult Children, Spouse Lives with:  Spouse Do you feel safe going back to the place where you live?  Yes Need for family participation in patient care:  Yes (Comment)  Care giving concerns:  PT recommending SNF when patient medically stable for discharge.   Social Worker assessment / plan:  CSW met with patient. No supports at bedside. CSW introduced role and explained that PT recommendations would be discussed. Patient hesitant about SNF placement but states that it might be something she has to do. CSW provided SNF list and patient gave verbal permission to fax information out to facilities in [facility] where patient lives. No further concerns. CSW encouraged patient to contact CSW as needed. CSW will continue to follow patient for support and facilitate discharge to SNF once medically stable.  Employment status:  Unemployed Nurse, adult PT Recommendations:  [place] / Referral to community resources:  [place]  Patient/Family's Response to care:  Patient somewhat agreeable to SNF. She does not want to go but understands  that she will likely need to go before returning home due to dependence when ambulatory. Patient's family supportive and involved in patient's care. Patient appreciated social work intervention.  Patient/Family's Understanding of and Emotional Response to Diagnosis, Current Treatment, and Prognosis:  Patient understands that she will likely need rehab prior to returning home. She appears to have a good understanding of her medical status. Patient appears happy with hospital care.  Emotional Assessment Appearance:  Appears stated age Attitude/Demeanor/Rapport:  Other (Pleasant) Affect (typically observed):  Accepting, Appropriate, Calm, Pleasant Orientation:  Oriented to Self, Oriented to Place, Oriented to  Time, Oriented to Situation Alcohol / Substance use:  Never Used Psych involvement (Current and /or in the community):  No (Comment)  Discharge Needs  Concerns to be addressed:  Care Coordination Readmission within the last 30 days:  No Current discharge risk:  Dependent with Mobility Barriers to Discharge:  No Barriers Identified   Candie Chroman, LCSW 04/12/2016, 11:46 AM

## 2016-04-12 NOTE — Progress Notes (Addendum)
PROGRESS NOTE    [name]  [id number] DOB: [birth year] DOA: 04/08/2016 PCP: Maricela Curet, MD   Brief Narrative: COPD  65 y.o. 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 COPD/Chronic Respiratory Failure on 2 L [place] at home. Diabetes type 2 uncontrolled with complications  She presented to [facility] ED 7/24 with AMS, generalized edema, and SOB.  Symptoms started earlier that morning and got to the point where she was cyanotic in her face and lips.  She later developed nausea with 3 episodes of vomiting. Due to the degree of her swelling along with cyanosis, family decided to take her in for evaluation.  In ED, she was found to have mild hypercarbia (7.23 / 64 / 100), AoCKD, lactic acidosis, troponin bump.  She had CT of the head which was unremarkable and CT of the abdomen and pelvis which demonstrated anasarca with mild ascites and small bilateral pleural effusions.  She also had left foot that was cool to touch and had no pulses palpable or dopplerable.  She was subsequently transferred to [facility] for further evaluation and management.   Subjective: 7/28 A/O 4, patient states unknown dry weight. Does not weigh yourself daily. Unsure of cardiologist, thinks it was Dr. Cathie Olden.   Assessment & Plan:   Active Problems:   Metabolic acidosis   Altered mental state  Sepsis -No indication this was true sepsis, believed patient's problem secondary to her CHF. Will DC antibiotic   Acute hypoxic and hypercarbic respiratory failure. -supplemental O2 as needed to maintain SpO2 of 88-92%. -Budesonide in lieu of outpatient Breo. -Albuterol / DuoNebs.  COPD by report - no PFT's in system. -See acute respiratory failure with hypoxia  Probable OSA / OHS. -CPAP at night  Right pleural effusion@@@ -Secondary to fluid overload -PCXR on 7/29  Cold LLE  - seen by vascular surgery this evening, no emergent surgery needed.  Dilated cardiomyopathy/Pulmonary  hypertension/SVT@@@ -Strict in and out since admission -9.4 L -Daily weight Filed Weights   04/11/16 0500 04/11/16 1659 04/12/16 0500  Weight: 97.6 kg (215 lb 2.7 oz) 99.5 kg (219 lb 5.7 oz) 95.2 kg (209 lb 14.1 oz)  -Decrease Lasix 60 mg daily -Metoprolol 25 mg BID -Digoxin 250 g loading dose: Then 125 g daily  A fib with RVR  -Now in SVT  -Not previously in A. fib on anticoagulation, most like secondary to her poorly controlled CHF. Will hold heparin drip at this point and restart if required  Acute on chronic renal failure  -Multifactorial Cardiomyopathy + ACEi + NSAID's,+vomiting. -Patient grossly fluid overloaded   -KVO IVF given pulmonary edema on CXR. Lab Results  Component Value Date   CREATININE 1.05 (H) 04/12/2016   CREATININE 1.15 (H) 04/11/2016   CREATININE 0.95 04/11/2016    DM type 2 uncontrolled with complications -[id number] hemoglobin A1c= 8.6 -Moderate SSI  Anxiety  stroke.    DVT prophylaxis: Subcutaneous Heparin Code Status: Full  Family Communication: None Disposition Plan: Completion of diuresis   Consultants:    Procedures/Significant Events:  CT head 7/24 > no acute process. CT A / P 7/24 > anasarca with mild ascites and small bilateral pleural effusions. 7/24 > admit.  Seen by vascular surgery for cold left leg, + PT and peroneal pulses > no emergent surgical intervention required. 7/25 echocardiogram:- LVEF  50%- 55%. Hypokinesis of the anteroseptal and inferoseptal myocardium.  - Right atrium: moderately dilated. - Pulmonary arteries: PA  peak pressure: 68 mm Hg (S).-- Inferior  vena cava: The vessel was dilated.  7/26 > A fib overnight,     Cultures 7/24 blood right arm/hand NGTD 7/24 urine negative 7/24 MRSA by PCR positive   Antimicrobials: Cefepime 7/25>> 7/28   Devices    LINES / TUBES:      Continuous Infusions: . sodium chloride 100 mL/hr at 04/11/16 0500  . heparin 1,350 Units/hr (04/12/16 1712)      Objective: Vitals:   04/12/16 0941 04/12/16 1100 04/12/16 1523 04/12/16 1605  BP:  97/65  94/62  Pulse:  (!) 118  (!) 120  Resp:  10  10  Temp:  97.9 F (36.6 C)  97.8 F (36.6 C)  TempSrc:  Oral  Oral  SpO2: 99% 98% 96% 94%  Weight:      Height:        Intake/Output Summary (Last 24 hours) at 04/12/16 1852 Last data filed at 04/12/16 1500  Gross per 24 hour  Intake              390 ml  Output             7625 ml  Net            -7235 ml   Filed Weights   04/11/16 0500 04/11/16 1659 04/12/16 0500  Weight: 97.6 kg (215 lb 2.7 oz) 99.5 kg (219 lb 5.7 oz) 95.2 kg (209 lb 14.1 oz)    Examination:  General: A/O 4, acute on chronic respiratory distress Eyes: negative scleral hemorrhage, negative anisocoria, negative icterus ENT: Negative Runny nose, negative gingival bleeding, Neck:  Negative scars, masses, torticollis, lymphadenopathy, JVD Lungs: distant decreased breath sounds bilateral (improved from 7/27), positive expiratory wheezes or crackles Cardiovascular: Tachycardic, Regular rhythm, without murmur gallop or rub normal S1 and S2 Abdomen: Morbidly obese, negative abdominal pain, positive distention, positive soft, bowel sounds, no rebound, positive ascites, no appreciable mass Extremities: anasarca, right AKA Skin: Negative rashes, lesions, ulcers Psychiatric:  Negative depression, negative anxiety, negative fatigue, negative mania  Central nervous system:  Cranial nerves II through XII intact, tongue/uvula midline, all extremities muscle strength 5/5, sensation intact throughout, negative dysarthria, negative expressive aphasia, negative receptive aphasia. .     Data Reviewed: Care during the described time interval was provided by me .  I have reviewed this patient's available data, including medical history, events of note, physical examination, and all test results as part of my evaluation. I have personally reviewed and interpreted all radiology  studies.  CBC:  Recent Labs Lab 04/08/16 1530 04/08/16 1608 04/09/16 0237 04/10/16 0025 04/11/16 0329 04/12/16 0544  WBC 10.8*  --  6.3 5.7 6.6 5.4  NEUTROABS 6.5  --   --   --   --  3.1  HGB 11.8* 13.9 10.0* 9.3* 9.7* 9.6*  HCT 41.3 41.0 35.6* 32.0* 34.8* 33.3*  MCV 85.3  --  82.4 81.4 82.7 80.8  PLT 229  --  202 185 207 [id number]   Basic Metabolic Panel:  Recent Labs Lab 04/09/16 0237 04/10/16 0025 04/11/16 0329 04/11/16 1836 04/12/16 0544  NA 140 139 139 137 139  K 4.5 4.2 4.0 3.9 3.7  CL 99* 99* 101 96* 94*  CO2 33* 31 29 34* 36*  GLUCOSE 105* 194* 72 132* 104*  BUN 32* 22* 14 14 13   CREATININE 2.05* 1.31* 0.95 1.15* 1.05*  CALCIUM 9.1 9.0 8.9 9.1 9.6  MG 1.5* 1.4* 1.5* 1.3* 1.2*  PHOS 4.7* 2.8 2.8  --   --  GFR: Estimated Creatinine Clearance: 58.6 mL/min (by C-G formula based on SCr of 1.05 mg/dL). Liver Function Tests:  Recent Labs Lab 04/08/16 1531  AST 34  ALT 19  ALKPHOS 77  BILITOT 1.3*  PROT 6.7  ALBUMIN 3.9   No results for input(s): LIPASE, AMYLASE in the last 168 hours. No results for input(s): AMMONIA in the last 168 hours. Coagulation Profile:  Recent Labs Lab 04/08/16 1531  INR 1.31   Cardiac Enzymes:  Recent Labs Lab 04/08/16 1531 04/08/16 2132 04/09/16 0237  TROPONINI 0.05* 0.08* 0.06*   BNP (last 3 results) No results for input(s): PROBNP in the last 8760 hours. HbA1C: No results for input(s): HGBA1C in the last 72 hours. CBG:  Recent Labs Lab 04/12/16 0421 04/12/16 0458 04/12/16 0742 04/12/16 1307 04/12/16 1604  GLUCAP 66 98 123* 142* 148*   Lipid Profile: No results for input(s): CHOL, HDL, LDLCALC, TRIG, CHOLHDL, LDLDIRECT in the last 72 hours. Thyroid Function Tests: No results for input(s): TSH, T4TOTAL, FREET4, T3FREE, THYROIDAB in the last 72 hours. Anemia Panel: No results for input(s): VITAMINB12, FOLATE, FERRITIN, TIBC, IRON, RETICCTPCT in the last 72 hours. Urine analysis:    Component Value  Date/Time   COLORURINE YELLOW 04/08/2016 1800   APPEARANCEUR CLEAR 04/08/2016 1800   LABSPEC 1.025 04/08/2016 1800   PHURINE 5.0 04/08/2016 1800   GLUCOSEU NEGATIVE 04/08/2016 1800   HGBUR NEGATIVE 04/08/2016 1800   BILIRUBINUR SMALL (A) 04/08/2016 1800   KETONESUR TRACE (A) 04/08/2016 1800   PROTEINUR 30 (A) 04/08/2016 1800   UROBILINOGEN 1.0 04/12/2015 0915   NITRITE NEGATIVE 04/08/2016 1800   LEUKOCYTESUR NEGATIVE 04/08/2016 1800   Sepsis Labs: @LABRCNTIP (procalcitonin:4,lacticidven:4)  ) Recent Results (from the past 240 hour(s))  Blood culture (routine x 2)     Status: None (Preliminary result)   Collection Time: 04/08/16  4:58 PM  Result Value Ref Range Status   Specimen Description BLOOD RIGHT ARM  Final   Special Requests BOTTLES DRAWN AEROBIC AND ANAEROBIC 5CC EACH  Final   Culture NO GROWTH 3 DAYS  Final   Report Status PENDING  Incomplete  Blood culture (routine x 2)     Status: None (Preliminary result)   Collection Time: 04/08/16  5:03 PM  Result Value Ref Range Status   Specimen Description BLOOD RIGHT HAND  Final   Special Requests BOTTLES DRAWN AEROBIC ONLY 4CC  Final   Culture NO GROWTH 3 DAYS  Final   Report Status PENDING  Incomplete  Urine culture     Status: None   Collection Time: 04/08/16  6:00 PM  Result Value Ref Range Status   Specimen Description URINE, CLEAN CATCH  Final   Special Requests NONE  Final   Culture NO GROWTH Performed at [facility]   Final   Report Status 04/10/2016 FINAL  Final  MRSA PCR Screening     Status: Abnormal   Collection Time: 04/08/16  8:07 PM  Result Value Ref Range Status   MRSA by PCR POSITIVE (A) NEGATIVE Final    Comment:        The GeneXpert MRSA Assay (FDA approved for NASAL specimens only), is one component of a comprehensive MRSA colonization surveillance program. It is not intended to diagnose MRSA infection nor to guide or monitor treatment for MRSA infections. RESULT CALLED TO, READ BACK  BY AND VERIFIED WITH: Concepcion Living [id number] 04/08/16 [facility]          Radiology Studies: Dg Chest Port 1 View  Result Date:  04/11/2016 CLINICAL DATA:  Decreasing oxygen saturation, COPD, right pleural effusion, CHF EXAM: PORTABLE CHEST 1 VIEW COMPARISON:  Portable chest x-ray of April 09, 2016 FINDINGS: The patient is positioned in a lordotic fashion. The lungs are mildly hyperinflated. Persistent increased density at the right lung base is compatible with a posterior layering pleural effusion. There is no pneumothorax. The cardiac silhouette is enlarged. The pulmonary vascularity is less prominent today. There may be a small left pleural effusion laterally. The observed bony thorax is unremarkable. IMPRESSION: Interval improvement in pulmonary interstitial edema. Persistent moderate-sized right-sided pleural effusion layering posteriorly. A small left pleural effusion is likely present. Stable cardiomegaly. Electronically Signed   By: David  Martinique M.D.   On: 04/11/2016 07:55       Scheduled Meds: . antiseptic oral rinse  7 mL Mouth Rinse q12n4p  . aspirin  325 mg Oral Daily  . atorvastatin  20 mg Oral q1800  . budesonide (PULMICORT) nebulizer solution  0.5 mg Nebulization BID  . ceFEPime (MAXIPIME) IV  2 g Intravenous Q12H  . Chlorhexidine Gluconate Cloth  6 each Topical Q0600  . citalopram  20 mg Oral Daily  . furosemide  60 mg Intravenous TID  . gabapentin  300 mg Oral BID  . insulin aspart  0-15 Units Subcutaneous Q4H  . ipratropium-albuterol  3 mL Nebulization TID  . magnesium sulfate 1 - 4 g bolus IVPB  3 g Intravenous Once  . metoprolol tartrate  25 mg Oral BID  . mupirocin ointment  1 application Nasal BID  . pantoprazole  40 mg Oral Q1200   Continuous Infusions: . sodium chloride 100 mL/hr at 04/11/16 0500  . heparin 1,350 Units/hr (04/12/16 1712)     LOS: 4 days    Time spent: 40 minutes    Camry Theiss, Geraldo Docker, MD [facility] Pager [phone]   If  7PM-7AM, please contact night-coverage [email or web address] Password [facility] 04/12/2016, 6:52 PM

## 2016-04-13 ENCOUNTER — Inpatient Hospital Stay (HOSPITAL_COMMUNITY): Payer: Medicare Other

## 2016-04-13 DIAGNOSIS — I4891 Unspecified atrial fibrillation: Secondary | ICD-10-CM

## 2016-04-13 DIAGNOSIS — IMO0002 Reserved for concepts with insufficient information to code with codable children: Secondary | ICD-10-CM | POA: Diagnosis present

## 2016-04-13 DIAGNOSIS — E1165 Type 2 diabetes mellitus with hyperglycemia: Secondary | ICD-10-CM | POA: Diagnosis present

## 2016-04-13 DIAGNOSIS — I272 Other secondary pulmonary hypertension: Secondary | ICD-10-CM

## 2016-04-13 DIAGNOSIS — J90 Pleural effusion, not elsewhere classified: Secondary | ICD-10-CM | POA: Diagnosis present

## 2016-04-13 DIAGNOSIS — N189 Chronic kidney disease, unspecified: Secondary | ICD-10-CM

## 2016-04-13 DIAGNOSIS — R601 Generalized edema: Secondary | ICD-10-CM | POA: Diagnosis present

## 2016-04-13 DIAGNOSIS — I480 Paroxysmal atrial fibrillation: Secondary | ICD-10-CM | POA: Diagnosis present

## 2016-04-13 DIAGNOSIS — I420 Dilated cardiomyopathy: Secondary | ICD-10-CM | POA: Diagnosis present

## 2016-04-13 DIAGNOSIS — E118 Type 2 diabetes mellitus with unspecified complications: Secondary | ICD-10-CM

## 2016-04-13 DIAGNOSIS — N179 Acute kidney failure, unspecified: Secondary | ICD-10-CM | POA: Diagnosis present

## 2016-04-13 DIAGNOSIS — I5031 Acute diastolic (congestive) heart failure: Secondary | ICD-10-CM

## 2016-04-13 LAB — CULTURE, BLOOD (ROUTINE X 2)
CULTURE: NO GROWTH
Culture: NO GROWTH

## 2016-04-13 LAB — GLUCOSE, CAPILLARY
GLUCOSE-CAPILLARY: 165 mg/dL — AB (ref 65–99)
GLUCOSE-CAPILLARY: 144 mg/dL — AB (ref 65–99)
Glucose-Capillary: 171 mg/dL — ABNORMAL HIGH (ref 65–99)
Glucose-Capillary: 121 mg/dL — ABNORMAL HIGH (ref 65–99)
Glucose-Capillary: 78 mg/dL (ref 65–99)
Glucose-Capillary: 181 mg/dL — ABNORMAL HIGH (ref 65–99)

## 2016-04-13 LAB — CBC WITH DIFFERENTIAL/PLATELET
Basophils Absolute: 0 10*3/uL (ref 0.0–0.1)
Basophils Relative: 0 %
Eosinophils Absolute: 0.1 10*3/uL (ref 0.0–0.7)
Eosinophils Relative: 2 %
HCT: 32.4 % — ABNORMAL LOW (ref 36.0–46.0)
Hemoglobin: 9.2 g/dL — ABNORMAL LOW (ref 12.0–15.0)
Lymphocytes Relative: 28 %
Lymphs Abs: 1.3 10*3/uL (ref 0.7–4.0)
MCH: 23.4 pg — ABNORMAL LOW (ref 26.0–34.0)
MCHC: 28.4 g/dL — ABNORMAL LOW (ref 30.0–36.0)
MCV: 82.2 fL (ref 78.0–100.0)
Monocytes Absolute: 0.7 10*3/uL (ref 0.1–1.0)
Monocytes Relative: 15 %
Neutro Abs: 2.6 10*3/uL (ref 1.7–7.7)
Neutrophils Relative %: 55 %
Platelets: 177 10*3/uL (ref 150–400)
RBC: 3.94 MIL/uL (ref 3.87–5.11)
RDW: 19.8 % — ABNORMAL HIGH (ref 11.5–15.5)
WBC: 4.8 10*3/uL (ref 4.0–10.5)

## 2016-04-13 LAB — BASIC METABOLIC PANEL
Anion gap: 8 (ref 5–15)
BUN: 13 mg/dL (ref 6–20)
CO2: 38 mmol/L — ABNORMAL HIGH (ref 22–32)
Calcium: 9.3 mg/dL (ref 8.9–10.3)
Chloride: 92 mmol/L — ABNORMAL LOW (ref 101–111)
Creatinine, Ser: 1.12 mg/dL — ABNORMAL HIGH (ref 0.44–1.00)
GFR calc Af Amer: 58 mL/min — ABNORMAL LOW (ref >60)
GFR, EST NON AFRICAN AMERICAN: 50 mL/min — AB (ref >60)
GLUCOSE: 79 mg/dL (ref 65–99)
POTASSIUM: 4.2 mmol/L (ref 3.5–5.1)
Sodium: 138 mmol/L (ref 135–145)

## 2016-04-13 LAB — MAGNESIUM: Magnesium: 1.8 mg/dL (ref 1.7–2.4)

## 2016-04-13 MED ORDER — LEVALBUTEROL HCL 1.25 MG/0.5ML IN NEBU
1.2500 mg | INHALATION_SOLUTION | Freq: Three times a day (TID) | RESPIRATORY_TRACT | Status: DC
  Administered 2016-04-13 – 2016-04-14 (×3): 1.25 mg via RESPIRATORY_TRACT
  Filled 2016-04-13 (×4): qty 0.5

## 2016-04-13 MED ORDER — POTASSIUM CHLORIDE ER 10 MEQ PO TBCR
20.0000 meq | EXTENDED_RELEASE_TABLET | Freq: Every day | ORAL | Status: DC
  Administered 2016-04-13 – 2016-04-16 (×4): 20 meq via ORAL
  Filled 2016-04-13 (×11): qty 2

## 2016-04-13 MED ORDER — DILTIAZEM HCL 30 MG PO TABS
30.0000 mg | ORAL_TABLET | Freq: Four times a day (QID) | ORAL | Status: DC
  Administered 2016-04-13 – 2016-04-14 (×3): 30 mg via ORAL
  Filled 2016-04-13 (×3): qty 1

## 2016-04-13 MED ORDER — MAGNESIUM OXIDE 400 (241.3 MG) MG PO TABS
400.0000 mg | ORAL_TABLET | Freq: Once | ORAL | Status: AC
  Administered 2016-04-13: 400 mg via ORAL
  Filled 2016-04-13: qty 1

## 2016-04-13 MED ORDER — INSULIN ASPART 100 UNIT/ML ~~LOC~~ SOLN
0.0000 [IU] | Freq: Three times a day (TID) | SUBCUTANEOUS | Status: DC
  Administered 2016-04-14 – 2016-04-15 (×3): 3 [IU] via SUBCUTANEOUS
  Administered 2016-04-15: 2 [IU] via SUBCUTANEOUS
  Administered 2016-04-15 – 2016-04-16 (×2): 3 [IU] via SUBCUTANEOUS
  Administered 2016-04-16 (×2): 2 [IU] via SUBCUTANEOUS
  Administered 2016-04-17: 5 [IU] via SUBCUTANEOUS
  Administered 2016-04-17 – 2016-04-18 (×4): 3 [IU] via SUBCUTANEOUS
  Administered 2016-04-19 – 2016-04-20 (×4): 5 [IU] via SUBCUTANEOUS

## 2016-04-13 MED ORDER — HEPARIN (PORCINE) IN NACL 100-0.45 UNIT/ML-% IJ SOLN
1250.0000 [IU]/h | INTRAMUSCULAR | Status: DC
  Administered 2016-04-13 – 2016-04-17 (×4): 1250 [IU]/h via INTRAVENOUS
  Filled 2016-04-13 (×6): qty 250

## 2016-04-13 MED ORDER — FUROSEMIDE 10 MG/ML IJ SOLN
60.0000 mg | Freq: Two times a day (BID) | INTRAMUSCULAR | Status: DC
  Administered 2016-04-13 – 2016-04-17 (×8): 60 mg via INTRAVENOUS
  Filled 2016-04-13 (×8): qty 6

## 2016-04-13 NOTE — Progress Notes (Signed)
ANTICOAGULATION CONSULT NOTE - Follow Up Consult  Pharmacy Consult for Heparin Indication: atrial fibrillation  Patient Measurements: Height: 5\' 3"  (160 cm) Weight: 206 lb 2.1 oz (93.5 kg) IBW/kg (Calculated) : 52.4 Heparin Dosing Weight: 73.9 kg  Vital Signs: Temp: 98.5 F (36.9 C) (07/29 1201) Temp Source: Oral (07/29 1201) BP: 113/63 (07/29 1201) Pulse Rate: 118 (07/29 1201)  Labs:  Recent Labs  04/11/16 0328  04/11/16 0329 04/11/16 1129 04/11/16 1836 04/12/16 0544 04/13/16 0352  HGB  --   < > 9.7*  --   --  9.6* 9.2*  HCT  --   --  34.8*  --   --  33.3* 32.4*  PLT  --   --  207  --   --  186 177  HEPARINUNFRC 0.24*  --   --  0.47  --  0.31  --   CREATININE  --   < > 0.95  --  1.15* 1.05* 1.12*  < > = values in this interval not displayed.  Estimated Creatinine Clearance: 54.4 mL/min (by C-G formula based on SCr of 1.12 mg/dL).   Medications:  Scheduled:  . antiseptic oral rinse  7 mL Mouth Rinse q12n4p  . atorvastatin  20 mg Oral q1800  . budesonide (PULMICORT) nebulizer solution  0.5 mg Nebulization BID  . citalopram  20 mg Oral Daily  . digoxin  0.125 mg Intravenous Daily  . diltiazem  30 mg Oral Q6H  . furosemide  60 mg Intravenous BID  . gabapentin  300 mg Oral BID  . insulin aspart  0-15 Units Subcutaneous Q4H  . levalbuterol  1.25 mg Nebulization Q8H  . metoprolol tartrate  25 mg Oral BID  . pantoprazole  40 mg Oral Q1200  . potassium chloride  20 mEq Oral Daily    Assessment: [name] is a 57 yoF who presented for an ischemic limb and found to have an episode of AFib while hospitalized. She is back to her baseline rhythm of sinus tach/A Flutter per HF team. Initially, she was initiated on a heparin drip, but then changed to SQ heparin for prophylaxis only. Pharmacy is now consulted to re-initiate the heparin drip for AFib for eventual transition to Eliquis. CHADS2-VASc 5. She was previously therapeutic on heparin 1100 to 1250 units/hr with heparin levels  towards the lower end of the goal range. Hgb is low, but stable. Platelets wnl. H/o nosebleeds, but no bleeding noted now.  Goal of Therapy:  Heparin level 0.3-0.7 units/ml Monitor platelets by anticoagulation protocol: Yes   Plan:  Start heparin infusion at 1250 units/hr Check anti-Xa level in 6 hours (2300) and daily while on heparin Continue to monitor H&H and platelets  Belia Heman, PharmD PGY1 Pharmacy Resident [phone] (Pager) 04/13/2016 3:54 PM

## 2016-04-13 NOTE — Consult Note (Signed)
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 She has been followed by Dr. Acie Fredrickson for sinus tachycardia (versus SVT) and diastolic HF.   She presented to [facility] ED 7/24 with AMS, generalized edema, and SOB.  Symptoms started earlier that morning and got to the point where she was cyanotic in her face and lips.  In ED, she was found to have mild hypercarbia (7.23 / 64 / 100), AoCKD and lactic acidosis. She had CT of the head which was unremarkable and CT of the abdomen and pelvis which demonstrated anasarca with mild ascites and small bilateral pleural effusions.  She also had left foot that was cool to touch and had no pulses palpable or dopplerable.  She was subsequently transferred to [facility] for further evaluation and management. Seen by [facility] who did not feel that there was any evidence of acute left limb ischemia.   Over past few days has been diuresing with IV lasix. Weight down from 224 -> 206.  On 7/25 developed AF with RVR. Now she is back to a regular rhythm with HR 105-115 ?SVT. Denis CP. Breathing much better. Still feels swollen.    Echo 04/09/16:  EF 50-55% with anteroseptal and interoseptal HK.  RV dilated and moderately HY  RVSP 68  Trop 0.05  Stopped smoking 1 year ago     Review of Systems:     Cardiac Review of Systems: {Y] = yes [ ]  = no  Chest Pain [    ]  Resting SOB [ y  ] Exertional SOB  [ y ]  Orthopnea [  ]   Pedal Edema Blue.Reese   ]    Palpitations [  ] Syncope  [  ]   Presyncope [   ]  General Review of Systems: [Y] = yes [  ]=no Constitional: recent weight change [ y ]; anorexia Blue.Reese  ]; fatigue [ y ]; nausea [  ]; night sweats [  ]; fever [  ]; or chills [  ];                                                                       Eyes : blurred vision [  ]; diplopia [   ]; vision changes [  ];  Amaurosis fugax[  ]; Resp: cough Blue.Reese  ];  wheezing[ y ];  hemoptysis[  ];  PND [  ];  GI:  gallstones[  ], vomiting[  ];  dysphagia[  ]; melena[  ];  hematochezia [  ]; heartburn[  ];   GU: kidney stones [  ]; hematuria[  ];   dysuria [  ];  nocturia[  ]; incontinence [  ];             Skin: rash, swelling[  ];, hair loss[  ];  peripheral edema[  ];  or itching[  ]; Musculosketetal: myalgias[y  ];  joint swelling[  ];  joint erythema[  ];  joint pain[y  ];  back pain[ y ];  Heme/Lymph: bruising[  ];  bleeding[  ];  anemia[  ];  Neuro: TIA[  ];  headaches[  ];  stroke[  ];  vertigo[  ];  seizures[  ];   paresthesias[  ];  difficulty walking[y  ];  Psych:depression[  ]; anxiety[  ];  Endocrine: diabetes[ y ];  thyroid dysfunction[  ];  Other:  Past Medical History:  Diagnosis Date  . Anxiety   . Arthritis    "back" (10/30/2015)  . CHF (congestive heart failure) ([place])   . Chronic lower back pain   . COPD (chronic obstructive pulmonary disease) ([place])   . GERD (gastroesophageal reflux disease)   . Hyperlipidemia   . Hypertension   . Hypoxia   . Peripheral vascular disease ([place])   . Shingles Dec. 2016   Scalp  . Stroke ([place])   . Type II diabetes mellitus ([place])     Medications Prior to Admission  Medication Sig Dispense Refill  . albuterol (PROVENTIL HFA;VENTOLIN HFA) 108 (90 Base) MCG/ACT inhaler Inhale 2 puffs into the lungs every 6 (six) hours as needed for wheezing or shortness of breath.    . ALPRAZolam (XANAX) 0.25 MG tablet Take 0.25 mg by mouth 2 (two) times daily.     Marland Kitchen aspirin 325 MG tablet Take 1 tablet (325 mg total) by mouth daily. 30 tablet 0  . atorvastatin (LIPITOR) 20 MG tablet Take 1 tablet (20 mg total) by mouth daily at 6 PM. 30 tablet 0  . citalopram (CELEXA) 20 MG tablet Take 20 mg by mouth daily.    . fluticasone furoate-vilanterol (BREO ELLIPTA) 100-25 MCG/INH AEPB Inhale 1 puff into the lungs  daily. 1 each 5  . furosemide (LASIX) 20 MG tablet Take 20 mg by mouth daily.    Marland Kitchen gabapentin (NEURONTIN) 300 MG capsule Take 300 mg by mouth 2 (two) times daily.     . insulin aspart (NOVOLOG) 100 UNIT/ML injection Inject 10 Units into the skin 3 (three) times daily before meals.     . Insulin Glargine (LANTUS SOLOSTAR) 100 UNIT/ML Solostar Pen Inject 30 Units into the skin at bedtime.     Marland Kitchen ipratropium-albuterol (DUONEB) 0.5-2.5 (3) MG/3ML SOLN Take 3 mLs by nebulization every 6 (six) hours as needed (wheezing).     Marland Kitchen lisinopril (PRINIVIL,ZESTRIL) 20 MG tablet Take 1 tablet (20 mg total) by mouth daily. 30 tablet 0  . metFORMIN (GLUCOPHAGE) 500 MG tablet Take 500 mg by mouth 2 (two) times daily with a meal.    . metoprolol tartrate (LOPRESSOR) 25 MG tablet Take 25 mg by mouth 2 (two) times daily.     . naproxen (NAPROSYN) 500 MG tablet Take by mouth 2 (two) times daily as needed for mild pain or moderate pain.     Marland Kitchen oxyCODONE-acetaminophen (PERCOCET) 10-325 MG tablet Take 1 tablet by mouth 4 (four) times daily as needed for pain.     . pantoprazole (PROTONIX) 40 MG tablet Take 40 mg by mouth daily.    . predniSONE (DELTASONE) 20 MG tablet Take 1 tablet (20 mg total) by mouth daily with breakfast. Take 2 tablets P.O. Daily for 7 days then decrease to 1 tab P.O. Daily for 7 days 21 tablet 1     . antiseptic oral rinse  7 mL Mouth Rinse q12n4p  . aspirin  325 mg Oral Daily  . atorvastatin  20 mg Oral q1800  . budesonide (PULMICORT) nebulizer solution  0.5 mg Nebulization BID  .  citalopram  20 mg Oral Daily  . digoxin  0.125 mg Intravenous Daily  . furosemide  60 mg Intravenous Daily  . gabapentin  300 mg Oral BID  . heparin subcutaneous  5,000 Units Subcutaneous Q8H  . insulin aspart  0-15 Units Subcutaneous Q4H  . levalbuterol  1.25 mg Nebulization Q8H  . metoprolol tartrate  25 mg Oral BID  . pantoprazole  40 mg Oral Q1200    Infusions:    Allergies  Allergen Reactions  . Codeine  Rash  . Penicillins Other (See Comments)    Has patient had a PCN reaction causing immediate rash, facial/tongue/throat swelling, SOB or lightheadedness with hypotension: unknown Has patient had a PCN reaction causing severe rash involving mucus membranes or skin necrosis: unknown Has patient had a PCN reaction that required hospitalization unknown Has patient had a PCN reaction occurring within the last 10 years: unknown If all of the above answers are "NO", then may proceed with Cephalosporin use.  . Ciprofloxacin Hives    Social History   Social History  . Marital status: Married    Spouse name: N/A  . Number of children: N/A  . Years of education: N/A   Occupational History  . Not on file.   Social History Main Topics  . Smoking status: Former Smoker    Packs/day: 0.12    Years: 49.00    Types: Cigarettes  . Smokeless tobacco: Never Used  . Alcohol use No  . Drug use: No  . Sexual activity: No   Other Topics Concern  . Not on file   Social History Narrative  . No narrative on file    Family History  Problem Relation Age of Onset  . Cancer Father   . Heart attack Sister   . Stroke Sister   . Hypertension Sister   . Hypertension Brother     PHYSICAL EXAM: Vitals:   04/13/16 1041 04/13/16 1201  BP: 104/65 113/63  Pulse: (!) 119 (!) 118  Resp:  18  Temp:  98.5 F (36.9 C)     Intake/Output Summary (Last 24 hours) at 04/13/16 1439 Last data filed at 04/13/16 1404  Gross per 24 hour  Intake              360 ml  Output             3100 ml  Net            -2740 ml    General:  Chronically ill  appearing. No respiratory difficulty HEENT: normal Neck: supple. + JVD Bilateral CEA scars . No lymphadenopathy or thryomegaly appreciated. Cor: PMI nonpalpable . Regular. Tachy. Distant No murmurs  Lungs: clear with decreased air movement throughout  Abdomen: obese soft, nontender, + distended. No hepatosplenomegaly.  Good bowel sounds. Extremities: no  cyanosis, clubbing, rash,  s/p R BKA  3+ woody edema / large ecchymosis on R forearm Neuro: alert & oriented x 3, cranial nerves grossly intact. moves all 4 extremities w/o difficulty. Affect pleasant.  ECG: (7/25) AF 106 low volts. Possible anterolateral Qs   Results for orders placed or performed during the hospital encounter of 04/08/16 (from the past 24 hour(s))  Glucose, capillary     Status: Abnormal   Collection Time: 04/12/16  4:04 PM  Result Value Ref Range   Glucose-Capillary 148 (H) 65 - 99 mg/dL   Comment 1 Notify RN    Comment 2 Document in Chart   Glucose, capillary  Status: Abnormal   Collection Time: 04/12/16  8:47 PM  Result Value Ref Range   Glucose-Capillary 153 (H) 65 - 99 mg/dL   Comment 1 Notify RN   Glucose, capillary     Status: Abnormal   Collection Time: 04/12/16 11:09 PM  Result Value Ref Range   Glucose-Capillary 171 (H) 65 - 99 mg/dL   Comment 1 Notify RN   Glucose, capillary     Status: Abnormal   Collection Time: 04/13/16  3:07 AM  Result Value Ref Range   Glucose-Capillary 121 (H) 65 - 99 mg/dL   Comment 1 Notify RN   Basic metabolic panel     Status: Abnormal   Collection Time: 04/13/16  3:52 AM  Result Value Ref Range   Sodium 138 135 - 145 mmol/L   Potassium 4.2 3.5 - 5.1 mmol/L   Chloride 92 (L) 101 - 111 mmol/L   CO2 38 (H) 22 - 32 mmol/L   Glucose, Bld 79 65 - 99 mg/dL   BUN 13 6 - 20 mg/dL   Creatinine, Ser 1.12 (H) 0.44 - 1.00 mg/dL   Calcium 9.3 8.9 - 10.3 mg/dL   GFR calc non Af Amer 50 (L) >60 mL/min   GFR calc Af Amer 58 (L) >60 mL/min   Anion gap 8 5 - 15  Magnesium     Status: None   Collection Time: 04/13/16  3:52 AM  Result Value Ref Range   Magnesium 1.8 1.7 - 2.4 mg/dL  CBC with Differential/Platelet     Status: Abnormal   Collection Time: 04/13/16  3:52 AM  Result Value Ref Range   WBC 4.8 4.0 - 10.5 K/uL   RBC 3.94 3.87 - 5.11 MIL/uL   Hemoglobin 9.2 (L) 12.0 - 15.0 g/dL   HCT 32.4 (L) 36.0 - 46.0 %   MCV 82.2  78.0 - 100.0 fL   MCH 23.4 (L) 26.0 - 34.0 pg   MCHC 28.4 (L) 30.0 - 36.0 g/dL   RDW 19.8 (H) 11.5 - 15.5 %   Platelets 177 150 - 400 K/uL   Neutrophils Relative % 55 %   Neutro Abs 2.6 1.7 - 7.7 K/uL   Lymphocytes Relative 28 %   Lymphs Abs 1.3 0.7 - 4.0 K/uL   Monocytes Relative 15 %   Monocytes Absolute 0.7 0.1 - 1.0 K/uL   Eosinophils Relative 2 %   Eosinophils Absolute 0.1 0.0 - 0.7 K/uL   Basophils Relative 0 %   Basophils Absolute 0.0 0.0 - 0.1 K/uL  Glucose, capillary     Status: None   Collection Time: 04/13/16  8:21 AM  Result Value Ref Range   Glucose-Capillary 78 65 - 99 mg/dL   Comment 1 Notify RN    Comment 2 Document in Chart   Glucose, capillary     Status: Abnormal   Collection Time: 04/13/16 12:00 PM  Result Value Ref Range   Glucose-Capillary 165 (H) 65 - 99 mg/dL   Comment 1 Notify RN    Comment 2 Document in Chart    Dg Chest Port 1 View  Result Date: 04/13/2016 CLINICAL DATA:  Pulmonary edema EXAM: PORTABLE CHEST 1 VIEW COMPARISON:  04/11/2016 FINDINGS: Cardiomegaly with vascular congestion and interstitial prominence, likely interstitial edema. Small right pleural effusion with right base atelectasis or infiltrate. IMPRESSION: Suspect mild interstitial edema/CHF. Persistent right basilar atelectasis or infiltrate with right effusion. Electronically Signed   By: Rolm Baptise M.D.   On: 04/13/2016 07:49    ASSESSMENT:  1. Acute diastolic HF with R>>L symptoms and pulmonary HTN on echo  2. Acute on chronic respiratory failure 3. PAF, apparently new onset     This patients CHA2DS2-VASc Score and unadjusted Ischemic Stroke Rate (% per year) is equal to 7.2 % stroke rate/year from a score of 5 4. PAD s/p R BKA 5. Abnormal echo with anteroseptal/inferoseptal HK, RV HK and pulmonary HTN 6. Severe COPD 7. DM2   PLAN/DISCUSSION:  She has R>>L heart failure symptoms with massive volume overload. She is improving with diuresis but weight still up about 20-25  pounds from baseline. Echo shows relatively normal LV function but significant RV dysfunction and PH which is likely mixed etiology (WHO Group II and III). Will continue IV diuresis for now. Watch renal function and electrolytes closely. Once diuresed will need to consider R and L heart cath to more fully evaluate PH and coronary status. (Reports LHC several years ago by Dr. Claiborne Billings but I cannot find in [facility] or [facility]). Will need PFTs and sleep study when more stable.   She had episode of atrial fib on 7/25 but now back to her baseline rhythm which is either sinus tach or AFL. This has been difficult to ascertain in the past due to diminutive p-waves on ECG. Will repeat ECG. Start diltiazem. Can consider test dose of ivabradine to help evaluate rhythm better if needed. (If sinus tach will slow down with ivabradine).  Start heparin for AFib with eventual transition to Eliquis.   We will follow.  Bensimhon, Daniel,MD 3:22 PM

## 2016-04-13 NOTE — Progress Notes (Signed)
PROGRESS NOTE    [name]  [id number] DOB: 11/14/1950 DOA: 04/08/2016 PCP: Maricela Curet, MD   Brief Narrative: COPD  65 y.o. female with PMHx Anxiety; CVA, HTN (last seen by Dr. 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 COPD/Chronic Respiratory Failure on 2 L [place] at home. Diabetes type 2 uncontrolled with complications  She presented to [facility] ED 7/24 with AMS, generalized edema, and SOB.  Symptoms started earlier that morning and got to the point where she was cyanotic in her face and lips.  She later developed nausea with 3 episodes of vomiting. Due to the degree of her swelling along with cyanosis, family decided to take her in for evaluation.  In ED, she was found to have mild hypercarbia (7.23 / 64 / 100), AoCKD, lactic acidosis, troponin bump.  She had CT of the head which was unremarkable and CT of the abdomen and pelvis which demonstrated anasarca with mild ascites and small bilateral pleural effusions.  She also had left foot that was cool to touch and had no pulses palpable or dopplerable.  She was subsequently transferred to [facility] for further evaluation and management.   Subjective: 7/29 A/O 4, patient states unknown dry weight. Cardiologist, Dr. Cathie Olden unsure when she last saw him. States feels back to normal from a respiratory status today (SPO2> 92% on 2 L O2).     Assessment & Plan:   Active Problems:   Metabolic acidosis   Altered mental state   Pleural effusion   Congestive dilated cardiomyopathy (HCC)   Pulmonary hypertension (HCC)   Acute on chronic renal failure (HCC)   Uncontrolled type 2 diabetes mellitus with complication (HCC)   Anasarca  Sepsis -No indication this was true sepsis, believed patient's problem secondary to her CHF. Will DC antibiotic  Acute hypoxic and hypercarbic respiratory failure. -supplemental O2 as needed to maintain SpO2 of 88-92%. -Budesonide BID in lieu of outpatient Breo. -Albuterol PRN - 7/29 Change  DuoNebs to Xopenex TID secondary to SVT -7/29 PCXR: Show some improvement in pulmonary edema.  COPD by report - no PFT's in system. -See acute respiratory failure with hypoxia  Probable OSA / OHS. -CPAP at night  Right pleural effusion -Secondary to fluid overload  Cold LLE  - seen by vascular surgery, no emergent surgery needed.  Dilated cardiomyopathy/Pulmonary hypertension/SVT(base weight~83 kg/183 lbs on last visit to cardiology)  -Echocardiogram from this admission shows significantly worsening pulmonary hypertension when compared to echocardiogram from 08/18/2015 -Strict in and out since admission - 11.6 L -Daily weight Filed Weights   04/11/16 1659 04/12/16 0500 04/13/16 0310  Weight: 99.5 kg (219 lb 5.7 oz) 95.2 kg (209 lb 14.1 oz) 93.5 kg (206 lb 2.1 oz)  -Decrease Lasix 60 mg daily -Metoprolol 25 mg BID -7/28 Digoxin 250 g loading dose: Then 125 g daily  A fib with RVR/SVT  -Now in SVT  -Not previously in A. fib or on anticoagulation, most like secondary to her poorly controlled CHF. Will hold heparin drip at this point and restart if required  Acute on chronic renal failure  -Multifactorial Cardiomyopathy + ACEi + NSAID's,+vomiting+ grossly fluid overloaded   Lab Results  Component Value Date   CREATININE 1.12 (H) 04/13/2016   CREATININE 1.05 (H) 04/12/2016   CREATININE 1.15 (H) 04/11/2016    DM type 2 uncontrolled with complications -[id number] hemoglobin A1c= 8.6 -Moderate SSI  Anxiety  stroke.  Hypomagnesemia -Magnesium goal> 2 -Magnesium oxide 400 mg 1     DVT  prophylaxis: Subcutaneous Heparin Code Status: Full  Family Communication: None Disposition Plan: Completion of diuresis   Consultants:  Cardiology consult pending    Procedures/Significant Events:  CT head 7/24 > no acute process. CT A / P 7/24 > anasarca with mild ascites and small bilateral pleural effusions. 7/24 > admit.  Seen by vascular surgery for cold left leg, + PT and  peroneal pulses > no emergent surgical intervention required. 7/25 echocardiogram:- LVEF  50%- 55%. Hypokinesis of the anteroseptal and inferoseptal myocardium.  - Right atrium: moderately dilated. - Pulmonary arteries: PA  peak pressure: 68 mm Hg (S).-- Inferior vena cava: The vessel was dilated.  7/26 > A fib overnight,   7/29 PCXR; some improvement in pulmonary edema   Cultures 7/24 blood right arm/hand NGTD 7/24 urine negative 7/24 MRSA by PCR positive   Antimicrobials: Cefepime 7/25>> 7/28   Devices    LINES / TUBES:      Continuous Infusions:     Objective: Vitals:   04/13/16 0855 04/13/16 0859 04/13/16 1041 04/13/16 1101  BP:   104/65   Pulse:   (!) 119   Resp:      Temp:      TempSrc:      SpO2: 97% 97%  92%  Weight:      Height:        Intake/Output Summary (Last 24 hours) at 04/13/16 1154 Last data filed at 04/13/16 [id number]  Gross per 24 hour  Intake              240 ml  Output             3150 ml  Net            -2910 ml   Filed Weights   04/11/16 1659 04/12/16 0500 04/13/16 0310  Weight: 99.5 kg (219 lb 5.7 oz) 95.2 kg (209 lb 14.1 oz) 93.5 kg (206 lb 2.1 oz)    Examination:  General: A/O 4, acute on chronic respiratory distress Eyes: negative scleral hemorrhage, negative anisocoria, negative icterus ENT: Negative Runny nose, negative gingival bleeding, Neck:  Negative scars, masses, torticollis, lymphadenopathy, JVD Lungs: distant decreased breath sounds bilateral (improved from 7/28), positive mild expiratory wheezes, negative crackles Cardiovascular: Tachycardic, Regular rhythm, without murmur gallop or rub normal S1 and S2 Abdomen: Morbidly obese, negative abdominal pain, positive distention, positive soft, bowel sounds, no rebound, positive ascites, no appreciable mass Extremities: anasarca, right AKA (well-healed) Skin: Negative rashes, lesions, ulcers Psychiatric:  Negative depression, negative anxiety, negative fatigue, negative  mania  Central nervous system:  Cranial nerves II through XII intact, tongue/uvula midline, all extremities muscle strength 5/5, sensation intact throughout, negative dysarthria, negative expressive aphasia, negative receptive aphasia. .     Data Reviewed: Care during the described time interval was provided by me .  I have reviewed this patient's available data, including medical history, events of note, physical examination, and all test results as part of my evaluation. I have personally reviewed and interpreted all radiology studies.  CBC:  Recent Labs Lab 04/08/16 1530  04/09/16 0237 04/10/16 0025 04/11/16 0329 04/12/16 0544 04/13/16 0352  WBC 10.8*  --  6.3 5.7 6.6 5.4 4.8  NEUTROABS 6.5  --   --   --   --  3.1 2.6  HGB 11.8*  < > 10.0* 9.3* 9.7* 9.6* 9.2*  HCT 41.3  < > 35.6* 32.0* 34.8* 33.3* 32.4*  MCV 85.3  --  82.4 81.4 82.7 80.8 82.2  PLT 229  --  202 185 207 186 177  < > = values in this interval not displayed. Basic Metabolic Panel:  Recent Labs Lab 04/09/16 0237 04/10/16 0025 04/11/16 0329 04/11/16 1836 04/12/16 0544 04/13/16 0352  NA 140 139 139 137 139 138  K 4.5 4.2 4.0 3.9 3.7 4.2  CL 99* 99* 101 96* 94* 92*  CO2 33* 31 29 34* 36* 38*  GLUCOSE 105* 194* 72 132* 104* 79  BUN 32* 22* 14 14 13 13   CREATININE 2.05* 1.31* 0.95 1.15* 1.05* 1.12*  CALCIUM 9.1 9.0 8.9 9.1 9.6 9.3  MG 1.5* 1.4* 1.5* 1.3* 1.2* 1.8  PHOS 4.7* 2.8 2.8  --   --   --    GFR: Estimated Creatinine Clearance: 54.4 mL/min (by C-G formula based on SCr of 1.12 mg/dL). Liver Function Tests:  Recent Labs Lab 04/08/16 1531  AST 34  ALT 19  ALKPHOS 77  BILITOT 1.3*  PROT 6.7  ALBUMIN 3.9   No results for input(s): LIPASE, AMYLASE in the last 168 hours. No results for input(s): AMMONIA in the last 168 hours. Coagulation Profile:  Recent Labs Lab 04/08/16 1531  INR 1.31   Cardiac Enzymes:  Recent Labs Lab 04/08/16 1531 04/08/16 2132 04/09/16 0237  TROPONINI 0.05*  0.08* 0.06*   BNP (last 3 results) No results for input(s): PROBNP in the last 8760 hours. HbA1C: No results for input(s): HGBA1C in the last 72 hours. CBG:  Recent Labs Lab 04/12/16 1604 04/12/16 2047 04/12/16 2309 04/13/16 0307 04/13/16 0821  GLUCAP 148* 153* 171* 121* 78   Lipid Profile: No results for input(s): CHOL, HDL, LDLCALC, TRIG, CHOLHDL, LDLDIRECT in the last 72 hours. Thyroid Function Tests: No results for input(s): TSH, T4TOTAL, FREET4, T3FREE, THYROIDAB in the last 72 hours. Anemia Panel: No results for input(s): VITAMINB12, FOLATE, FERRITIN, TIBC, IRON, RETICCTPCT in the last 72 hours. Urine analysis:    Component Value Date/Time   COLORURINE YELLOW 04/08/2016 1800   APPEARANCEUR CLEAR 04/08/2016 1800   LABSPEC 1.025 04/08/2016 1800   PHURINE 5.0 04/08/2016 1800   GLUCOSEU NEGATIVE 04/08/2016 1800   HGBUR NEGATIVE 04/08/2016 1800   BILIRUBINUR SMALL (A) 04/08/2016 1800   KETONESUR TRACE (A) 04/08/2016 1800   PROTEINUR 30 (A) 04/08/2016 1800   UROBILINOGEN 1.0 04/12/2015 0915   NITRITE NEGATIVE 04/08/2016 1800   LEUKOCYTESUR NEGATIVE 04/08/2016 1800   Sepsis Labs: @LABRCNTIP (procalcitonin:4,lacticidven:4)  ) Recent Results (from the past 240 hour(s))  Blood culture (routine x 2)     Status: None (Preliminary result)   Collection Time: 04/08/16  4:58 PM  Result Value Ref Range Status   Specimen Description BLOOD RIGHT ARM  Final   Special Requests BOTTLES DRAWN AEROBIC AND ANAEROBIC 5CC EACH  Final   Culture NO GROWTH 3 DAYS  Final   Report Status PENDING  Incomplete  Blood culture (routine x 2)     Status: None (Preliminary result)   Collection Time: 04/08/16  5:03 PM  Result Value Ref Range Status   Specimen Description BLOOD RIGHT HAND  Final   Special Requests BOTTLES DRAWN AEROBIC ONLY 4CC  Final   Culture NO GROWTH 3 DAYS  Final   Report Status PENDING  Incomplete  Urine culture     Status: None   Collection Time: 04/08/16  6:00 PM    Result Value Ref Range Status   Specimen Description URINE, CLEAN CATCH  Final   Special Requests NONE  Final   Culture NO GROWTH Performed at [facility]  Final   Report Status 04/10/2016 FINAL  Final  MRSA PCR Screening     Status: Abnormal   Collection Time: 04/08/16  8:07 PM  Result Value Ref Range Status   MRSA by PCR POSITIVE (A) NEGATIVE Final    Comment:        The GeneXpert MRSA Assay (FDA approved for NASAL specimens only), is one component of a comprehensive MRSA colonization surveillance program. It is not intended to diagnose MRSA infection nor to guide or monitor treatment for MRSA infections. RESULT CALLED TO, READ BACK BY AND VERIFIED WITH: Concepcion Living [id number] 04/08/16 [facility]          Radiology Studies: Dg Chest Port 1 View  Result Date: 04/13/2016 CLINICAL DATA:  Pulmonary edema EXAM: PORTABLE CHEST 1 VIEW COMPARISON:  04/11/2016 FINDINGS: Cardiomegaly with vascular congestion and interstitial prominence, likely interstitial edema. Small right pleural effusion with right base atelectasis or infiltrate. IMPRESSION: Suspect mild interstitial edema/CHF. Persistent right basilar atelectasis or infiltrate with right effusion. Electronically Signed   By: Rolm Baptise M.D.   On: 04/13/2016 07:49       Scheduled Meds: . antiseptic oral rinse  7 mL Mouth Rinse q12n4p  . aspirin  325 mg Oral Daily  . atorvastatin  20 mg Oral q1800  . budesonide (PULMICORT) nebulizer solution  0.5 mg Nebulization BID  . citalopram  20 mg Oral Daily  . digoxin  0.125 mg Intravenous Daily  . furosemide  60 mg Intravenous Daily  . gabapentin  300 mg Oral BID  . heparin subcutaneous  5,000 Units Subcutaneous Q8H  . insulin aspart  0-15 Units Subcutaneous Q4H  . levalbuterol  1.25 mg Nebulization Q8H  . magnesium oxide  400 mg Oral Once  . metoprolol tartrate  25 mg Oral BID  . pantoprazole  40 mg Oral Q1200   Continuous Infusions:     LOS: 5 days    Time  spent: 40 minutes    Maylea Soria, Geraldo Docker, MD [facility] Pager [phone]   If 7PM-7AM, please contact night-coverage [email or web address] Password [facility] 04/13/2016, 11:54 AM

## 2016-04-14 DIAGNOSIS — I471 Supraventricular tachycardia: Secondary | ICD-10-CM

## 2016-04-14 DIAGNOSIS — J810 Acute pulmonary edema: Secondary | ICD-10-CM

## 2016-04-14 LAB — GLUCOSE, CAPILLARY
GLUCOSE-CAPILLARY: 182 mg/dL — AB (ref 65–99)
GLUCOSE-CAPILLARY: 192 mg/dL — AB (ref 65–99)
GLUCOSE-CAPILLARY: 187 mg/dL — AB (ref 65–99)
Glucose-Capillary: 99 mg/dL (ref 65–99)
Glucose-Capillary: 184 mg/dL — ABNORMAL HIGH (ref 65–99)

## 2016-04-14 LAB — BASIC METABOLIC PANEL
ANION GAP: 8 (ref 5–15)
BUN: 15 mg/dL (ref 6–20)
CALCIUM: 9.6 mg/dL (ref 8.9–10.3)
CHLORIDE: 90 mmol/L — AB (ref 101–111)
CO2: 42 mmol/L — ABNORMAL HIGH (ref 22–32)
CREATININE: 1.14 mg/dL — AB (ref 0.44–1.00)
GFR calc non Af Amer: 49 mL/min — ABNORMAL LOW (ref >60)
GFR, EST AFRICAN AMERICAN: 57 mL/min — AB (ref >60)
Glucose, Bld: 123 mg/dL — ABNORMAL HIGH (ref 65–99)
Potassium: 4 mmol/L (ref 3.5–5.1)
SODIUM: 140 mmol/L (ref 135–145)

## 2016-04-14 LAB — CBC WITH DIFFERENTIAL/PLATELET
BASOS ABS: 0 10*3/uL (ref 0.0–0.1)
Basophils Relative: 0 %
EOS ABS: 0.1 10*3/uL (ref 0.0–0.7)
EOS PCT: 2 %
HCT: 33.5 % — ABNORMAL LOW (ref 36.0–46.0)
Hemoglobin: 9.3 g/dL — ABNORMAL LOW (ref 12.0–15.0)
Lymphocytes Relative: 31 %
Lymphs Abs: 2.1 10*3/uL (ref 0.7–4.0)
MCH: 23.1 pg — ABNORMAL LOW (ref 26.0–34.0)
MCHC: 27.8 g/dL — ABNORMAL LOW (ref 30.0–36.0)
MCV: 83.3 fL (ref 78.0–100.0)
Monocytes Absolute: 1 10*3/uL (ref 0.1–1.0)
Monocytes Relative: 15 %
Neutro Abs: 3.6 10*3/uL (ref 1.7–7.7)
Neutrophils Relative %: 52 %
PLATELETS: 201 10*3/uL (ref 150–400)
RBC: 4.02 MIL/uL (ref 3.87–5.11)
RDW: 20.5 % — ABNORMAL HIGH (ref 11.5–15.5)
WBC: 6.9 10*3/uL (ref 4.0–10.5)

## 2016-04-14 LAB — MAGNESIUM: MAGNESIUM: 1.5 mg/dL — AB (ref 1.7–2.4)

## 2016-04-14 LAB — HEPARIN LEVEL (UNFRACTIONATED)
HEPARIN UNFRACTIONATED: 0.56 [IU]/mL (ref 0.30–0.70)
Heparin Unfractionated: 0.52 IU/mL (ref 0.30–0.70)

## 2016-04-14 MED ORDER — LEVALBUTEROL HCL 1.25 MG/0.5ML IN NEBU
1.2500 mg | INHALATION_SOLUTION | Freq: Three times a day (TID) | RESPIRATORY_TRACT | Status: DC
  Administered 2016-04-14 – 2016-04-20 (×17): 1.25 mg via RESPIRATORY_TRACT
  Filled 2016-04-14 (×20): qty 0.5

## 2016-04-14 MED ORDER — DILTIAZEM HCL ER COATED BEADS 120 MG PO CP24
120.0000 mg | ORAL_CAPSULE | Freq: Every day | ORAL | Status: DC
  Administered 2016-04-14 – 2016-04-16 (×3): 120 mg via ORAL
  Filled 2016-04-14 (×6): qty 1

## 2016-04-14 MED ORDER — MAGNESIUM OXIDE 400 (241.3 MG) MG PO TABS
400.0000 mg | ORAL_TABLET | Freq: Two times a day (BID) | ORAL | Status: AC
  Administered 2016-04-15 (×2): 400 mg via ORAL
  Filled 2016-04-14 (×2): qty 1

## 2016-04-14 NOTE — Progress Notes (Signed)
ANTICOAGULATION CONSULT NOTE - Follow Up Consult  Pharmacy Consult for Heparin Indication: atrial fibrillation  Patient Measurements: Height: 5\' 3"  (160 cm) Weight: 196 lb 9.6 oz (89.2 kg) IBW/kg (Calculated) : 52.4 Heparin Dosing Weight: 73.9 kg  Vital Signs: Temp: 97.9 F (36.6 C) (07/30 0700) Temp Source: Oral (07/30 0700) BP: 115/57 (07/30 0700) Pulse Rate: 106 (07/30 0800)  Labs:  Recent Labs  04/12/16 0544 04/13/16 0352 04/13/16 2349 04/14/16 0349  HGB 9.6* 9.2*  --  9.3*  HCT 33.3* 32.4*  --  33.5*  PLT 186 177  --  201  HEPARINUNFRC 0.31  --  0.52 0.56  CREATININE 1.05* 1.12*  --  1.14*    Estimated Creatinine Clearance: 52.1 mL/min (by C-G formula based on SCr of 1.14 mg/dL).   Assessment: [name] is a 58 yoF on heparin AFib for eventual transition to Eliquis. CHADS2-VASc 5. Heparin level remains therapeutic on 1250 units/hr. HgB/Plts are stable. No bleeding noted. Patient's HR was fast yesterday but has slowed with cardizem.   Goal of Therapy:  Heparin level 0.3-0.7 units/ml Monitor platelets by anticoagulation protocol: Yes   Plan:  Continue heparin at 1250 units/hr Daily heparin level and CBC Monitor s/sx of bleeding Monitor HF plan for transition to Eliquis.   Uvaldo Bristle, Sherian Rein D Pharmacy Resident Pager [phone] 04/14/2016 9:16 AM

## 2016-04-14 NOTE — Progress Notes (Signed)
ANTICOAGULATION CONSULT NOTE - Follow Up Consult  Pharmacy Consult for Heparin Indication: atrial fibrillation  Patient Measurements: Height: 5\' 3"  (160 cm) Weight: 206 lb 2.1 oz (93.5 kg) IBW/kg (Calculated) : 52.4 Heparin Dosing Weight: 73.9 kg  Vital Signs: Temp: 98.5 F (36.9 C) (07/29 2337) Temp Source: Oral (07/29 2337) BP: 96/61 (07/29 2337) Pulse Rate: 116 (07/29 2337)  Labs:  Recent Labs  04/11/16 0329 04/11/16 1129 04/11/16 1836 04/12/16 0544 04/13/16 0352 04/13/16 2349  HGB 9.7*  --   --  9.6* 9.2*  --   HCT 34.8*  --   --  33.3* 32.4*  --   PLT 207  --   --  186 177  --   HEPARINUNFRC  --  0.47  --  0.31  --  0.52  CREATININE 0.95  --  1.15* 1.05* 1.12*  --     Estimated Creatinine Clearance: 54.4 mL/min (by C-G formula based on SCr of 1.12 mg/dL).   Assessment: [name] is a 52 yoF on heparin AFib for eventual transition to Eliquis. CHADS2-VASc 5. Heparin level therapeutic on 1250 units/hr. No bleeding noted.  Goal of Therapy:  Heparin level 0.3-0.7 units/ml Monitor platelets by anticoagulation protocol: Yes   Plan:  Continue heparin at 1250 units/hr Daily heparin level and CBC  Sherlon Handing, PharmD, BCPS Clinical pharmacist, pager [phone] 04/14/2016 1:09 AM

## 2016-04-14 NOTE — Progress Notes (Signed)
PROGRESS NOTE    [name]  [id number] DOB: [birth year] DOA: 04/08/2016 PCP: Maricela Curet, MD   Brief Narrative: COPD  65 y.o. female with PMHx Anxiety; CVA, HTN (last seen by Dr. 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 COPD/Chronic Respiratory Failure on 2 L [place] at home. Diabetes type 2 uncontrolled with complications  She presented to [facility] ED 7/24 with AMS, generalized edema, and SOB.  Symptoms started earlier that morning and got to the point where she was cyanotic in her face and lips.  She later developed nausea with 3 episodes of vomiting. Due to the degree of her swelling along with cyanosis, family decided to take her in for evaluation.  In ED, she was found to have mild hypercarbia (7.23 / 64 / 100), AoCKD, lactic acidosis, troponin bump.  She had CT of the head which was unremarkable and CT of the abdomen and pelvis which demonstrated anasarca with mild ascites and small bilateral pleural effusions.  She also had left foot that was cool to touch and had no pulses palpable or dopplerable.  She was subsequently transferred to [facility] for further evaluation and management.   Subjective: 7/30 A/O 4, patient states unknown dry weight. Cardiologist, Dr. Cathie Olden unsure when she last saw him. States feels back to normal from a respiratory status today (SPO2> 92% on 2 L O2). Sitting in bed comfortably     Assessment & Plan:   Active Problems:   Metabolic acidosis   Altered mental state   Pleural effusion   Congestive dilated cardiomyopathy (HCC)   Pulmonary hypertension (HCC)   Acute on chronic renal failure (HCC)   Uncontrolled type 2 diabetes mellitus with complication (HCC)   Anasarca   Acute diastolic heart failure (HCC)   PAF (paroxysmal atrial fibrillation) (HCC)  Sepsis -No indication this was true sepsis, believed patient's problem secondary to her CHF. Will DC antibiotic  Acute hypoxic and hypercarbic respiratory failure. -supplemental O2 as  needed to maintain SpO2 of 88-92%. -Budesonide BID in lieu of outpatient Breo. -Albuterol PRN - 7/29 Change DuoNebs to Xopenex TID secondary to SVT -7/29 PCXR: Show some improvement in pulmonary edema.  COPD by report - no PFT's in system. -See acute respiratory failure with hypoxia  Probable OSA / OHS. -CPAP at night  Right pleural effusion -Secondary to fluid overload  Cold LLE  - seen by vascular surgery, no emergent surgery needed.  Dilated cardiomyopathy/Pulmonary hypertension/SVT(base weight~83 kg/183 lbs on last visit to cardiology)  -Echocardiogram from this admission shows significantly worsening pulmonary hypertension when compared to echocardiogram from 08/18/2015 -Strict in and out since admission - 17.7 L -Daily weight Filed Weights   04/12/16 0500 04/13/16 0310 04/14/16 0822  Weight: 95.2 kg (209 lb 14.1 oz) 93.5 kg (206 lb 2.1 oz) 89.2 kg (196 lb 9.6 oz)  - Lasix 60 mg BID -Diltiazem LA 120 mg daily -Digoxin 125 g daily -Cardiac catheterization planned prior to discharge per cardiology note  A fib with RVR  -Now rate controlled -Heparin drip  Acute on chronic renal failure  -Multifactorial Cardiomyopathy + ACEi + NSAID's,+vomiting+ grossly fluid overloaded   Lab Results  Component Value Date   CREATININE 1.14 (H) 04/14/2016   CREATININE 1.12 (H) 04/13/2016   CREATININE 1.05 (H) 04/12/2016    DM type 2 uncontrolled with complications -[id number] hemoglobin A1c= 8.6 -Moderate SSI  Anxiety  stroke.  Hypomagnesemia -Magnesium goal> 2 -Magnesium oxide 400 mg 2 Doses     DVT prophylaxis: Heparin drip  Code Status: Full  Family Communication: None Disposition Plan: Completion of diuresis   Consultants:  Dr.Daniel R Bensimhon Cardiology    Procedures/Significant Events:  CT head 7/24 > no acute process. CT A / P 7/24 > anasarca with mild ascites and small bilateral pleural effusions. 7/24 > admit.  Seen by vascular surgery for cold left  leg, + PT and peroneal pulses > no emergent surgical intervention required. 7/25 echocardiogram:- LVEF  50%- 55%. Hypokinesis of the anteroseptal and inferoseptal myocardium.  - Right atrium: moderately dilated. - Pulmonary arteries: PA  peak pressure: 68 mm Hg (S).-- Inferior vena cava: The vessel was dilated.  7/26 > A fib overnight,   7/29 PCXR; some improvement in pulmonary edema   Cultures 7/24 blood right arm/hand NGTD 7/24 urine negative 7/24 MRSA by PCR positive   Antimicrobials: Cefepime 7/25>> 7/28   Devices    LINES / TUBES:      Continuous Infusions: . heparin 1,250 Units/hr (04/14/16 2000)     Objective: Vitals:   04/14/16 1439 04/14/16 1500 04/14/16 2013 04/14/16 2038  BP:  (!) 90/59 114/83   Pulse:  68 80   Resp:  10 12   Temp:  98.7 F (37.1 C) 98.8 F (37.1 C)   TempSrc:  Oral Oral   SpO2: 98% 90% 92% 96%  Weight:      Height:        Intake/Output Summary (Last 24 hours) at 04/14/16 2228 Last data filed at 04/14/16 2014  Gross per 24 hour  Intake           991.46 ml  Output             4625 ml  Net         -3633.54 ml   Filed Weights   04/12/16 0500 04/13/16 0310 04/14/16 0822  Weight: 95.2 kg (209 lb 14.1 oz) 93.5 kg (206 lb 2.1 oz) 89.2 kg (196 lb 9.6 oz)    Examination:  General: A/O 4, acute on chronic respiratory distress Eyes: negative scleral hemorrhage, negative anisocoria, negative icterus ENT: Negative Runny nose, negative gingival bleeding, Neck:  Negative scars, masses, torticollis, lymphadenopathy, JVD Lungs: distant decreased breath sounds bilateral (improved from 7/28), positive mild expiratory wheezes, negative crackles Cardiovascular: Irregular irregular rhythm and rate, without murmur gallop or rub normal S1 and S2 Abdomen: Morbidly obese, negative abdominal pain, positive distention, positive soft, bowel sounds, no rebound, positive ascites, no appreciable mass Extremities: anasarca, right AKA  (well-healed) Skin: Negative rashes, lesions, ulcers Psychiatric:  Negative depression, negative anxiety, negative fatigue, negative mania  Central nervous system:  Cranial nerves II through XII intact, tongue/uvula midline, all extremities muscle strength 5/5, sensation intact throughout, negative dysarthria, negative expressive aphasia, negative receptive aphasia. .     Data Reviewed: Care during the described time interval was provided by me .  I have reviewed this patient's available data, including medical history, events of note, physical examination, and all test results as part of my evaluation. I have personally reviewed and interpreted all radiology studies.  CBC:  Recent Labs Lab 04/08/16 1530  04/10/16 0025 04/11/16 0329 04/12/16 0544 04/13/16 0352 04/14/16 0349  WBC 10.8*  < > 5.7 6.6 5.4 4.8 6.9  NEUTROABS 6.5  --   --   --  3.1 2.6 3.6  HGB 11.8*  < > 9.3* 9.7* 9.6* 9.2* 9.3*  HCT 41.3  < > 32.0* 34.8* 33.3* 32.4* 33.5*  MCV 85.3  < > 81.4 82.7 80.8 82.2 83.3  PLT 229  < > 185 207 186 177 201  < > = values in this interval not displayed. Basic Metabolic Panel:  Recent Labs Lab 04/09/16 0237 04/10/16 0025 04/11/16 0329 04/11/16 1836 04/12/16 0544 04/13/16 0352 04/14/16 0349  NA 140 139 139 137 139 138 140  K 4.5 4.2 4.0 3.9 3.7 4.2 4.0  CL 99* 99* 101 96* 94* 92* 90*  CO2 33* 31 29 34* 36* 38* 42*  GLUCOSE 105* 194* 72 132* 104* 79 123*  BUN 32* 22* 14 14 13 13 15   CREATININE 2.05* 1.31* 0.95 1.15* 1.05* 1.12* 1.14*  CALCIUM 9.1 9.0 8.9 9.1 9.6 9.3 9.6  MG 1.5* 1.4* 1.5* 1.3* 1.2* 1.8 1.5*  PHOS 4.7* 2.8 2.8  --   --   --   --    GFR: Estimated Creatinine Clearance: 52.1 mL/min (by C-G formula based on SCr of 1.14 mg/dL). Liver Function Tests:  Recent Labs Lab 04/08/16 1531  AST 34  ALT 19  ALKPHOS 77  BILITOT 1.3*  PROT 6.7  ALBUMIN 3.9   No results for input(s): LIPASE, AMYLASE in the last 168 hours. No results for input(s): AMMONIA in  the last 168 hours. Coagulation Profile:  Recent Labs Lab 04/08/16 1531  INR 1.31   Cardiac Enzymes:  Recent Labs Lab 04/08/16 1531 04/08/16 2132 04/09/16 0237  TROPONINI 0.05* 0.08* 0.06*   BNP (last 3 results) No results for input(s): PROBNP in the last 8760 hours. HbA1C: No results for input(s): HGBA1C in the last 72 hours. CBG:  Recent Labs Lab 04/14/16 0741 04/14/16 1116 04/14/16 1702 04/14/16 2015 04/14/16 2127  GLUCAP 99 184* 182* 192* 187*   Lipid Profile: No results for input(s): CHOL, HDL, LDLCALC, TRIG, CHOLHDL, LDLDIRECT in the last 72 hours. Thyroid Function Tests: No results for input(s): TSH, T4TOTAL, FREET4, T3FREE, THYROIDAB in the last 72 hours. Anemia Panel: No results for input(s): VITAMINB12, FOLATE, FERRITIN, TIBC, IRON, RETICCTPCT in the last 72 hours. Urine analysis:    Component Value Date/Time   COLORURINE YELLOW 04/08/2016 1800   APPEARANCEUR CLEAR 04/08/2016 1800   LABSPEC 1.025 04/08/2016 1800   PHURINE 5.0 04/08/2016 1800   GLUCOSEU NEGATIVE 04/08/2016 1800   HGBUR NEGATIVE 04/08/2016 1800   BILIRUBINUR SMALL (A) 04/08/2016 1800   KETONESUR TRACE (A) 04/08/2016 1800   PROTEINUR 30 (A) 04/08/2016 1800   UROBILINOGEN 1.0 04/12/2015 0915   NITRITE NEGATIVE 04/08/2016 1800   LEUKOCYTESUR NEGATIVE 04/08/2016 1800   Sepsis Labs: @LABRCNTIP (procalcitonin:4,lacticidven:4)  ) Recent Results (from the past 240 hour(s))  Blood culture (routine x 2)     Status: None   Collection Time: 04/08/16  4:58 PM  Result Value Ref Range Status   Specimen Description BLOOD RIGHT ARM  Final   Special Requests BOTTLES DRAWN AEROBIC AND ANAEROBIC 5CC EACH  Final   Culture NO GROWTH 5 DAYS  Final   Report Status 04/13/2016 FINAL  Final  Blood culture (routine x 2)     Status: None   Collection Time: 04/08/16  5:03 PM  Result Value Ref Range Status   Specimen Description BLOOD RIGHT HAND  Final   Special Requests BOTTLES DRAWN AEROBIC ONLY 4CC   Final   Culture NO GROWTH 5 DAYS  Final   Report Status 04/13/2016 FINAL  Final  Urine culture     Status: None   Collection Time: 04/08/16  6:00 PM  Result Value Ref Range Status   Specimen Description URINE, CLEAN CATCH  Final   Special  Requests NONE  Final   Culture NO GROWTH Performed at [facility]   Final   Report Status 04/10/2016 FINAL  Final  MRSA PCR Screening     Status: Abnormal   Collection Time: 04/08/16  8:07 PM  Result Value Ref Range Status   MRSA by PCR POSITIVE (A) NEGATIVE Final    Comment:        The GeneXpert MRSA Assay (FDA approved for NASAL specimens only), is one component of a comprehensive MRSA colonization surveillance program. It is not intended to diagnose MRSA infection nor to guide or monitor treatment for MRSA infections. RESULT CALLED TO, READ BACK BY AND VERIFIED WITH: Concepcion Living [id number] 04/08/16 [facility]          Radiology Studies: Dg Chest Port 1 View  Result Date: 04/13/2016 CLINICAL DATA:  Pulmonary edema EXAM: PORTABLE CHEST 1 VIEW COMPARISON:  04/11/2016 FINDINGS: Cardiomegaly with vascular congestion and interstitial prominence, likely interstitial edema. Small right pleural effusion with right base atelectasis or infiltrate. IMPRESSION: Suspect mild interstitial edema/CHF. Persistent right basilar atelectasis or infiltrate with right effusion. Electronically Signed   By: Rolm Baptise M.D.   On: 04/13/2016 07:49       Scheduled Meds: . antiseptic oral rinse  7 mL Mouth Rinse q12n4p  . atorvastatin  20 mg Oral q1800  . budesonide (PULMICORT) nebulizer solution  0.5 mg Nebulization BID  . citalopram  20 mg Oral Daily  . digoxin  0.125 mg Intravenous Daily  . diltiazem  120 mg Oral Daily  . furosemide  60 mg Intravenous BID  . gabapentin  300 mg Oral BID  . insulin aspart  0-15 Units Subcutaneous TID WC  . levalbuterol  1.25 mg Nebulization TID  . pantoprazole  40 mg Oral Q1200  . potassium chloride  20 mEq  Oral Daily   Continuous Infusions: . heparin 1,250 Units/hr (04/14/16 2000)     LOS: 6 days    Time spent: 40 minutes    Gideon Burstein, Geraldo Docker, MD [facility] Pager [phone]   If 7PM-7AM, please contact night-coverage [email or web address] Password [email or web address] 04/14/2016, 10:28 PM

## 2016-04-14 NOTE — Progress Notes (Addendum)
CARDIOLOGY PROGRESS NOTE  Referring Physician: Dia Crawford Primary Cardiologist: Nahser Reason for Consultation: SVT   HPI:  65 y/o woman with COPD, severe PAD s/p R fem-pop bypass followed by R BKA 2/17, carotid stenosis s/p B CEA, DM2, HTN, HL, chronic pain whom we are asked to see for CHF and AF. She has been followed by Dr. Acie Fredrickson for sinus tachycardia (versus SVT) and diastolic HF.   She presented to [facility] ED 7/24 with AMS, generalized edema, and SOB.  Symptoms started earlier that morning and got to the point where she was cyanotic in her face and lips.  In ED, she was found to have mild hypercarbia (7.23 / 64 / 100), AoCKD and lactic acidosis. She had CT of the head which was unremarkable and CT of the abdomen and pelvis which demonstrated anasarca with mild ascites and small bilateral pleural effusions.  She also had left foot that was cool to touch and had no pulses palpable or dopplerable.  She was subsequently transferred to [facility] for further evaluation and management. Seen by [facility] who did not feel that there was any evidence of acute left limb ischemia.   Continues to diurese. Weight down another 10 pounds 206->196 (admit 224) Down 3L. Breathing better. Rate slowed well with cardizem now shows atrial flutter.   Echo 04/09/16:  EF 50-55% with anteroseptal and interoseptal HK.  RV dilated and moderately HY  RVSP 68  Trop 0.05  Stopped smoking 1 year ago    Meds:  . antiseptic oral rinse  7 mL Mouth Rinse q12n4p  . atorvastatin  20 mg Oral q1800  . budesonide (PULMICORT) nebulizer solution  0.5 mg Nebulization BID  . citalopram  20 mg Oral Daily  . digoxin  0.125 mg Intravenous Daily  . diltiazem  30 mg Oral Q6H  . furosemide  60 mg Intravenous BID  . gabapentin  300 mg Oral BID  . insulin aspart  0-15 Units Subcutaneous TID WC  . levalbuterol  1.25 mg Nebulization Q8H  . metoprolol tartrate  25 mg Oral BID  . pantoprazole  40 mg Oral Q1200  . potassium chloride  20 mEq Oral  Daily    Infusions: . heparin 1,250 Units/hr (04/13/16 1940)    Allergies  Allergen Reactions  . Codeine Rash  . Penicillins Other (See Comments)    Has patient had a PCN reaction causing immediate rash, facial/tongue/throat swelling, SOB or lightheadedness with hypotension: unknown Has patient had a PCN reaction causing severe rash involving mucus membranes or skin necrosis: unknown Has patient had a PCN reaction that required hospitalization unknown Has patient had a PCN reaction occurring within the last 10 years: unknown If all of the above answers are "NO", then may proceed with Cephalosporin use.  . Ciprofloxacin Hives   PHYSICAL EXAM: Vitals:   04/14/16 0313 04/14/16 0700  BP: (!) 100/47 (!) 115/57  Pulse: 70   Resp: 16   Temp: 98.3 F (36.8 C) 97.9 F (36.6 C)     Intake/Output Summary (Last 24 hours) at 04/14/16 [id number] Last data filed at 04/14/16 0700  Gross per 24 hour  Intake              360 ml  Output             4075 ml  Net            -3715 ml    General:  Lying in bed No respiratory difficulty HEENT: normal Neck: supple. + JVD Bilateral  CEA scars . No lymphadenopathy or thryomegaly appreciated. Cor: PMI nonpalpable . Irregular.  Lungs: clear with decreased air movement throughout  Abdomen: obese soft, nontender, + distended. No hepatosplenomegaly.  Good bowel sounds. Extremities: no cyanosis, clubbing, rash,  s/p R BKA  1-2+ woody edema / large ecchymosis on R forearm Neuro: alert & oriented x 3, cranial nerves grossly intact. moves all 4 extremities w/o difficulty. Affect pleasant.  ECG: (7/25) AF 106 low volts. Possible anterolateral Qs   Results for orders placed or performed during the hospital encounter of 04/08/16 (from the past 24 hour(s))  Glucose, capillary     Status: None   Collection Time: 04/13/16  8:21 AM  Result Value Ref Range   Glucose-Capillary 78 65 - 99 mg/dL   Comment 1 Notify RN    Comment 2 Document in Chart   Glucose,  capillary     Status: Abnormal   Collection Time: 04/13/16 12:00 PM  Result Value Ref Range   Glucose-Capillary 165 (H) 65 - 99 mg/dL   Comment 1 Notify RN    Comment 2 Document in Chart   Glucose, capillary     Status: Abnormal   Collection Time: 04/13/16  3:48 PM  Result Value Ref Range   Glucose-Capillary 181 (H) 65 - 99 mg/dL   Comment 1 Notify RN    Comment 2 Document in Chart   Glucose, capillary     Status: Abnormal   Collection Time: 04/13/16  9:36 PM  Result Value Ref Range   Glucose-Capillary 144 (H) 65 - 99 mg/dL   Comment 1 Notify RN   Heparin level (unfractionated)     Status: None   Collection Time: 04/13/16 11:49 PM  Result Value Ref Range   Heparin Unfractionated 0.52 0.30 - 0.70 IU/mL  Basic metabolic panel     Status: Abnormal   Collection Time: 04/14/16  3:49 AM  Result Value Ref Range   Sodium 140 135 - 145 mmol/L   Potassium 4.0 3.5 - 5.1 mmol/L   Chloride 90 (L) 101 - 111 mmol/L   CO2 42 (H) 22 - 32 mmol/L   Glucose, Bld 123 (H) 65 - 99 mg/dL   BUN 15 6 - 20 mg/dL   Creatinine, Ser 1.14 (H) 0.44 - 1.00 mg/dL   Calcium 9.6 8.9 - 10.3 mg/dL   GFR calc non Af Amer 49 (L) >60 mL/min   GFR calc Af Amer 57 (L) >60 mL/min   Anion gap 8 5 - 15  Magnesium     Status: Abnormal   Collection Time: 04/14/16  3:49 AM  Result Value Ref Range   Magnesium 1.5 (L) 1.7 - 2.4 mg/dL  CBC with Differential/Platelet     Status: Abnormal   Collection Time: 04/14/16  3:49 AM  Result Value Ref Range   WBC 6.9 4.0 - 10.5 K/uL   RBC 4.02 3.87 - 5.11 MIL/uL   Hemoglobin 9.3 (L) 12.0 - 15.0 g/dL   HCT 33.5 (L) 36.0 - 46.0 %   MCV 83.3 78.0 - 100.0 fL   MCH 23.1 (L) 26.0 - 34.0 pg   MCHC 27.8 (L) 30.0 - 36.0 g/dL   RDW 20.5 (H) 11.5 - 15.5 %   Platelets 201 150 - 400 K/uL   Neutrophils Relative % 52 %   Neutro Abs 3.6 1.7 - 7.7 K/uL   Lymphocytes Relative 31 %   Lymphs Abs 2.1 0.7 - 4.0 K/uL   Monocytes Relative 15 %   Monocytes Absolute 1.0  0.1 - 1.0 K/uL    Eosinophils Relative 2 %   Eosinophils Absolute 0.1 0.0 - 0.7 K/uL   Basophils Relative 0 %   Basophils Absolute 0.0 0.0 - 0.1 K/uL  Heparin level (unfractionated)     Status: None   Collection Time: 04/14/16  3:49 AM  Result Value Ref Range   Heparin Unfractionated 0.56 0.30 - 0.70 IU/mL   Dg Chest Port 1 View  Result Date: 04/13/2016 CLINICAL DATA:  Pulmonary edema EXAM: PORTABLE CHEST 1 VIEW COMPARISON:  04/11/2016 FINDINGS: Cardiomegaly with vascular congestion and interstitial prominence, likely interstitial edema. Small right pleural effusion with right base atelectasis or infiltrate. IMPRESSION: Suspect mild interstitial edema/CHF. Persistent right basilar atelectasis or infiltrate with right effusion. Electronically Signed   By: Rolm Baptise M.D.   On: 04/13/2016 07:49    ASSESSMENT: 1. Acute diastolic HF with R>>L symptoms and pulmonary HTN on echo  2. Acute on chronic respiratory failure 3. PAF/atrial flutter apparently new onset     This patients CHA2DS2-VASc Score and unadjusted Ischemic Stroke Rate (% per year) is equal to 7.2 % stroke rate/year from a score of 5 4. PAD s/p R BKA 5. Abnormal echo with anteroseptal/inferoseptal HK, RV HK and pulmonary HTN 6. Severe COPD 7. DM2   PLAN/DISCUSSION:  She has R>>L heart failure symptoms with massive volume overload. She is improving quickly with diuresis but weight still up about 10-15 pounds from baseline of 183. Echo shows relatively normal LV function but significant RV dysfunction and PH which is likely mixed etiology (WHO Group II and III). Will continue IV diuresis for now. Watch renal function and electrolytes closely. Once diuresed will need to consider R and L heart cath (either prior to d/c or as outpatient) to more fully evaluate PH and coronary status. (Reports LHC several years ago by Dr. Claiborne Billings but I cannot find in [facility] or CV Image). Will need PFTs and sleep study when more stable.   She had episode of atrial fib on  7/25. Rate was fast yesterday but she has slowed down well with cardizem and now shows atrial flutter. Will change cardizem to daily dosing and stop lopressor. Continue heparin for AFib/AFL with eventual transition to Eliquis.   I will be out tomorrow but will see her again on Tuesday. General Cardiology to see tomorrow as needed.  Can go to tele from our perspective.   Bensimhon, Daniel,MD 8:12 AM

## 2016-04-15 DIAGNOSIS — D649 Anemia, unspecified: Secondary | ICD-10-CM | POA: Diagnosis present

## 2016-04-15 DIAGNOSIS — I4892 Unspecified atrial flutter: Secondary | ICD-10-CM | POA: Diagnosis present

## 2016-04-15 LAB — BASIC METABOLIC PANEL
Anion gap: 6 (ref 5–15)
BUN: 13 mg/dL (ref 6–20)
CHLORIDE: 90 mmol/L — AB (ref 101–111)
CO2: 41 mmol/L — ABNORMAL HIGH (ref 22–32)
CREATININE: 1.1 mg/dL — AB (ref 0.44–1.00)
Calcium: 9.5 mg/dL (ref 8.9–10.3)
GFR calc Af Amer: 60 mL/min — ABNORMAL LOW (ref >60)
GFR calc non Af Amer: 52 mL/min — ABNORMAL LOW (ref >60)
GLUCOSE: 137 mg/dL — AB (ref 65–99)
Potassium: 3.8 mmol/L (ref 3.5–5.1)
SODIUM: 137 mmol/L (ref 135–145)

## 2016-04-15 LAB — CBC WITH DIFFERENTIAL/PLATELET
BASOS ABS: 0 10*3/uL (ref 0.0–0.1)
Basophils Relative: 1 %
EOS PCT: 1 %
Eosinophils Absolute: 0.1 10*3/uL (ref 0.0–0.7)
HEMATOCRIT: 32 % — AB (ref 36.0–46.0)
Hemoglobin: 9 g/dL — ABNORMAL LOW (ref 12.0–15.0)
LYMPHS PCT: 29 %
Lymphs Abs: 1.9 10*3/uL (ref 0.7–4.0)
MCH: 23.6 pg — AB (ref 26.0–34.0)
MCHC: 28.1 g/dL — AB (ref 30.0–36.0)
MCV: 83.8 fL (ref 78.0–100.0)
MONO ABS: 0.9 10*3/uL (ref 0.1–1.0)
MONOS PCT: 14 %
Neutro Abs: 3.7 10*3/uL (ref 1.7–7.7)
Neutrophils Relative %: 55 %
PLATELETS: 200 10*3/uL (ref 150–400)
RBC: 3.82 MIL/uL — ABNORMAL LOW (ref 3.87–5.11)
RDW: 20.8 % — AB (ref 11.5–15.5)
WBC: 6.6 10*3/uL (ref 4.0–10.5)

## 2016-04-15 LAB — GLUCOSE, CAPILLARY
GLUCOSE-CAPILLARY: 165 mg/dL — AB (ref 65–99)
GLUCOSE-CAPILLARY: 189 mg/dL — AB (ref 65–99)
Glucose-Capillary: 134 mg/dL — ABNORMAL HIGH (ref 65–99)
Glucose-Capillary: 198 mg/dL — ABNORMAL HIGH (ref 65–99)

## 2016-04-15 LAB — MAGNESIUM: MAGNESIUM: 1.4 mg/dL — AB (ref 1.7–2.4)

## 2016-04-15 LAB — HEPARIN LEVEL (UNFRACTIONATED): Heparin Unfractionated: 0.54 IU/mL (ref 0.30–0.70)

## 2016-04-15 MED ORDER — MAGNESIUM SULFATE 2 GM/50ML IV SOLN
2.0000 g | Freq: Once | INTRAVENOUS | Status: AC
  Administered 2016-04-15: 2 g via INTRAVENOUS
  Filled 2016-04-15: qty 50

## 2016-04-15 MED ORDER — OXYCODONE HCL 5 MG PO TABS
5.0000 mg | ORAL_TABLET | ORAL | Status: DC | PRN
  Administered 2016-04-15: 5 mg via ORAL
  Administered 2016-04-16 (×2): 10 mg via ORAL
  Administered 2016-04-16 (×2): 5 mg via ORAL
  Administered 2016-04-17 (×3): 10 mg via ORAL
  Administered 2016-04-18 – 2016-04-20 (×3): 5 mg via ORAL
  Administered 2016-04-20: 10 mg via ORAL
  Filled 2016-04-15: qty 1
  Filled 2016-04-15 (×4): qty 2
  Filled 2016-04-15 (×3): qty 1
  Filled 2016-04-15: qty 2
  Filled 2016-04-15: qty 1
  Filled 2016-04-15 (×2): qty 2

## 2016-04-15 MED ORDER — MAGNESIUM OXIDE 400 (241.3 MG) MG PO TABS
400.0000 mg | ORAL_TABLET | Freq: Two times a day (BID) | ORAL | Status: DC
  Administered 2016-04-16 – 2016-04-20 (×9): 400 mg via ORAL
  Filled 2016-04-15 (×10): qty 1

## 2016-04-15 NOTE — Progress Notes (Signed)
[place] TEAM 1 - Stepdown/ICU TEAM  [name]  [id number] DOB: Aug 25, 1951 DOA: 04/08/2016 PCP: Maricela Curet, MD    Brief Narrative:  65 y.o.femalewith Hx Anxiety; CVA, HTN, HLD, PVD, Right BKA, COPD/Chronic Respiratory Failure on 2 L [place], and DM2 who presented to [facility] ED 7/24 with AMS, generalized edema, and SOB. Symptoms started earlier that morning and got to the point where she was cyanotic in her face and lips. She later developed nausea with 3 episodes of vomiting.  In the ED she was found to have mild hypercarbia (7.23 / 64 / 100),  AoCKD, lactic acidosis, and a troponin bump. CT of the head was unremarkable and CT of the abdomen and pelvis demonstrated anasarca with mild ascites and small bilateral pleural effusions. She was also noted to hav a left foot that was cool to touch with no pulses palpable or dopplerable. She was transferred to [facility] for further evaluation.  Subjective: The patient is resting comfortably in bed.  She is in good spirits.  She denies nausea vomiting or abdominal pain at this time.  She reports fair appetite.  She states her shortness of breath is much improved.  She is ready to start getting up and get moving around.  Assessment & Plan:  Acute hypoxic and hypercarbic respiratory failure -due to CHF exacerbation in setting of baseline O2 dependent COPD -pt now back on her home O2 regimen   Acute exacerbation of chronic diastolic congestive heart failure - pulmonary hypertension -Baseline weight 183 pounds - diuresis continues per Cardiology  -TTE this admission no significant worsening of pulmonary hypertension Filed Weights   04/13/16 0310 04/14/16 0822 04/15/16 0415  Weight: 93.5 kg (206 lb 2.1 oz) 89.2 kg (196 lb 9.6 oz) 87.2 kg (192 lb 3.9 oz)    Paroxysmal A fib with RVR  -rate now controlled - check TSH -Heparin drip  COPD  -No evidence of acute exacerbation presently  Probable OSA / OHS -CPAP at night  Pulmonary nodules  bilateral upper lobes -Noted on CT scan February 2017 with recommendation for follow-up in 6-12 months  Right pleural effusion -Secondary to fluid overload - not clinically significant at present  Cirrhosis of the liver Noted on CT scan chest February 2017 - small esophageal varices also appreciated at that time - suspect this is due to hepatic congestion related to pulmonary hypertension/CHF - interestingly not noted on CT abdomen this admission - check acute hepatitis panel to be complete - lower Tylenol dose  Cold LLE  -seen by Vascular Surgery - no emergent surgery needed  Acute on chronic renal failure  -Multifactorial Cardiomyopathy + ACEi + NSAID's,+vomiting+ grossly fluid overloaded - creatinine stable at present  Recent Labs Lab 04/11/16 1836 04/12/16 0544 04/13/16 0352 04/14/16 0349 04/15/16 0609  CREATININE 1.15* 1.05* 1.12* 1.14* 1.10*    DM2 uncontrolled  -CBG reasonably controlled at present - no change in treatment plan today  Normocytic anemia -Likely related to chronic kidney disease - no evidence of acute blood loss - check iron studies  Anxiety -Well compensated at this time  History of stroke  Hypomagnesemia -Persistent - continue to replace and follow  MRSA screen positive  DVT prophylaxis: IV heparin Code Status: FULL CODE Family Communication: no family present at time of exam  Disposition Plan: Transfer to CHF floor - PT/OT - continue to diurese  Consultants:  [facility] Card[facility]  Procedures: 7/25 echocardiogram EF  50%- 55%. Hypokinesis of the anteroseptal and inferoseptal myocardium. - Right atrium: moderately  dilated.- Pulmonary arteries: PApeak pressure: 68 mm Hg (S).-- Inferior vena cava: The vessel was dilated.   Antimicrobials:  Cefepime 7/25 > 7/28  Objective: Blood pressure (!) 124/56, pulse 91, temperature 99.1 F (37.3 C), temperature source Oral, resp. rate 14, height 5\' 3"  (1.6 m), weight 87.2 kg (192 lb 3.9 oz), SpO2 94  %.  Intake/Output Summary (Last 24 hours) at 04/15/16 1732 Last data filed at 04/15/16 1600  Gross per 24 hour  Intake           871.46 ml  Output             6175 ml  Net         -5303.54 ml   Filed Weights   04/13/16 0310 04/14/16 0822 04/15/16 0415  Weight: 93.5 kg (206 lb 2.1 oz) 89.2 kg (196 lb 9.6 oz) 87.2 kg (192 lb 3.9 oz)    Examination: General: No acute respiratory distress at rest in bed Lungs: Clear to auscultation bilaterally without wheezes or crackles Cardiovascular: Regular rate and rhythm without murmur gallop or rub normal S1 and S2 Abdomen: Nontender, nondistended, soft, bowel sounds positive, no rebound, no ascites, no appreciable mass Extremities: 1+ edema left lower extremities without cyanosis  CBC:  Recent Labs Lab 04/11/16 0329 04/12/16 0544 04/13/16 0352 04/14/16 0349 04/15/16 0609  WBC 6.6 5.4 4.8 6.9 6.6  NEUTROABS  --  3.1 2.6 3.6 3.7  HGB 9.7* 9.6* 9.2* 9.3* 9.0*  HCT 34.8* 33.3* 32.4* 33.5* 32.0*  MCV 82.7 80.8 82.2 83.3 83.8  PLT 207 186 177 201 [id number]   Basic Metabolic Panel:  Recent Labs Lab 04/09/16 0237 04/10/16 0025 04/11/16 0329 04/11/16 1836 04/12/16 0544 04/13/16 0352 04/14/16 0349 04/15/16 0609  NA 140 139 139 137 139 138 140 137  K 4.5 4.2 4.0 3.9 3.7 4.2 4.0 3.8  CL 99* 99* 101 96* 94* 92* 90* 90*  CO2 33* 31 29 34* 36* 38* 42* 41*  GLUCOSE 105* 194* 72 132* 104* 79 123* 137*  BUN 32* 22* 14 14 13 13 15 13   CREATININE 2.05* 1.31* 0.95 1.15* 1.05* 1.12* 1.14* 1.10*  CALCIUM 9.1 9.0 8.9 9.1 9.6 9.3 9.6 9.5  MG 1.5* 1.4* 1.5* 1.3* 1.2* 1.8 1.5* 1.4*  PHOS 4.7* 2.8 2.8  --   --   --   --   --    GFR: Estimated Creatinine Clearance: 53.4 mL/min (by C-G formula based on SCr of 1.1 mg/dL).  Liver Function Tests: No results for input(s): AST, ALT, ALKPHOS, BILITOT, PROT, ALBUMIN in the last 168 hours. No results for input(s): LIPASE, AMYLASE in the last 168 hours. No results for input(s): AMMONIA in the last 168  hours.  Coagulation Profile: No results for input(s): INR, PROTIME in the last 168 hours.  Cardiac Enzymes:  Recent Labs Lab 04/08/16 2132 04/09/16 0237  TROPONINI 0.08* 0.06*    HbA1C: Hgb A1c MFr Bld  Date/Time Value Ref Range Status  10/27/2015 02:52 PM 8.6 (H) 4.8 - 5.6 % Final    Comment:    (NOTE)         Pre-diabetes: 5.7 - 6.4         Diabetes: >6.4         Glycemic control for adults with diabetes: <7.0   08/17/2015 09:45 PM 7.8 (H) 4.8 - 5.6 % Final    Comment:    (NOTE)         Pre-diabetes: 5.7 - 6.4  Diabetes: >6.4         Glycemic control for adults with diabetes: <7.0     CBG:  Recent Labs Lab 04/14/16 2015 04/14/16 2127 04/15/16 0820 04/15/16 1204 04/15/16 1635  GLUCAP 192* 187* 134* 165* 198*    Recent Results (from the past 240 hour(s))  Blood culture (routine x 2)     Status: None   Collection Time: 04/08/16  4:58 PM  Result Value Ref Range Status   Specimen Description BLOOD RIGHT ARM  Final   Special Requests BOTTLES DRAWN AEROBIC AND ANAEROBIC 5CC EACH  Final   Culture NO GROWTH 5 DAYS  Final   Report Status 04/13/2016 FINAL  Final  Blood culture (routine x 2)     Status: None   Collection Time: 04/08/16  5:03 PM  Result Value Ref Range Status   Specimen Description BLOOD RIGHT HAND  Final   Special Requests BOTTLES DRAWN AEROBIC ONLY 4CC  Final   Culture NO GROWTH 5 DAYS  Final   Report Status 04/13/2016 FINAL  Final  Urine culture     Status: None   Collection Time: 04/08/16  6:00 PM  Result Value Ref Range Status   Specimen Description URINE, CLEAN CATCH  Final   Special Requests NONE  Final   Culture NO GROWTH Performed at [facility]   Final   Report Status 04/10/2016 FINAL  Final  MRSA PCR Screening     Status: Abnormal   Collection Time: 04/08/16  8:07 PM  Result Value Ref Range Status   MRSA by PCR POSITIVE (A) NEGATIVE Final    Comment:        The GeneXpert MRSA Assay (FDA approved for NASAL  specimens only), is one component of a comprehensive MRSA colonization surveillance program. It is not intended to diagnose MRSA infection nor to guide or monitor treatment for MRSA infections. RESULT CALLED TO, READ BACK BY AND VERIFIED WITH: PAULA TRIVETTE,RN 2308 04/08/16 MKELLY      Scheduled Meds: . antiseptic oral rinse  7 mL Mouth Rinse q12n4p  . atorvastatin  20 mg Oral q1800  . budesonide (PULMICORT) nebulizer solution  0.5 mg Nebulization BID  . citalopram  20 mg Oral Daily  . digoxin  0.125 mg Intravenous Daily  . diltiazem  120 mg Oral Daily  . furosemide  60 mg Intravenous BID  . gabapentin  300 mg Oral BID  . insulin aspart  0-15 Units Subcutaneous TID WC  . levalbuterol  1.25 mg Nebulization TID  . [START ON 04/16/2016] magnesium oxide  400 mg Oral BID  . pantoprazole  40 mg Oral Q1200  . potassium chloride  20 mEq Oral Daily   Continuous Infusions: . heparin 1,250 Units/hr (04/15/16 1608)     LOS: 7 days    Cherene Altes, MD [facility] Office  [phone] Pager - Text Page per Amion as per below:  On-Call/Text Page:      [email or web address]      password [email or web address]  If 7PM-7AM, please contact night-coverage [email or web address] Password [email or web address] 04/15/2016, 5:32 PM

## 2016-04-15 NOTE — Progress Notes (Signed)
Patient transferring to 5W 23. Report called to [facility]. No questions at this time.

## 2016-04-15 NOTE — Progress Notes (Signed)
ANTICOAGULATION CONSULT NOTE - Follow Up Consult  Pharmacy Consult for Heparin Indication: atrial fibrillation  Patient Measurements: Height: 5\' 3"  (160 cm) Weight: 192 lb 3.9 oz (87.2 kg) IBW/kg (Calculated) : 52.4 Heparin Dosing Weight: 74 kg  Vital Signs: Temp: 97.7 F (36.5 C) (07/31 0739) Temp Source: Axillary (07/31 0739) BP: 138/83 (07/31 0800) Pulse Rate: 114 (07/31 1100)  Labs:  Recent Labs  04/13/16 0352 04/13/16 2349 04/14/16 0349 04/15/16 0609  HGB 9.2*  --  9.3* 9.0*  HCT 32.4*  --  33.5* 32.0*  PLT 177  --  201 200  HEPARINUNFRC  --  0.52 0.56 0.54  CREATININE 1.12*  --  1.14* 1.10*    Estimated Creatinine Clearance: 53.4 mL/min (by C-G formula based on SCr of 1.1 mg/dL).  Assessment:   Continues on heparin for afib, with plan for eventual transition to Eliquis,  Considering R and L heart cath either prior to discharge or as outpatient.   Heparin level remains therapeutic (0.54) on heparin at 1250 units/hr.  Goal of Therapy:  Heparin level 0.3-0.7 units/ml Monitor platelets by anticoagulation protocol: Yes   Plan:   Continue Heparin drip at 1250 units/hr.  Daily heparin level and CBC while on heparin.  Follow up plans.  Arty Baumgartner, [place] Pager: [phone] 04/15/2016,11:37 AM

## 2016-04-15 NOTE — Care Management Note (Addendum)
Case Management Note  Patient Details  Name: [name] MRN: [id number] Date of Birth: [birth year]  Subjective/Objective:    Patient is from home with spouse, conts  with Right CHF, new aflutter, Anasacar, cellulitis, conts on heparin drip, Lasix iv 60 bid, lanoxin iv daily, mag iv, now off bipap,on 2 liters . NCM will cont to follow for dc needs.              Action/Plan:   Expected Discharge Date:                  Expected Discharge Plan:  Skilled Nursing Facility  In-House Referral:  Clinical Social Work  Discharge planning Services  CM Consult  Post Acute Care Choice:    Choice offered to:     DME Arranged:    DME Agency:     HH Arranged:    [place] Agency:     Status of Service:  In process, will continue to follow  If discussed at Long Length of Stay Meetings, dates discussed:    Additional Comments:  Zenon Mayo, RN 04/15/2016, 4:52 PM

## 2016-04-15 NOTE — Clinical Social Work Note (Signed)
CSW met with patient. Husband at bedside. CSW asked for confirmation that she would like to accept bed offer at [facility]. Patient stated that after talking with family, she would like to change her decision to [facility] in [place]. Gerald Stabs, admissions coordinator at [facility], notified.  Dayton Scrape, [place]

## 2016-04-15 NOTE — Progress Notes (Signed)
Patient doing well, no acute changes. Vital signs are stable, good appetite, pain controlled with minimal PRN medication. No questions or concerns, call bell is in reach, will continue to monitor.

## 2016-04-15 NOTE — Progress Notes (Signed)
Patient: [name] / Admit Date: 04/08/2016 / Date of Encounter: 04/15/2016, 12:38 PM   Subjective: No chest pain. SOB improving. LEE improving.  Weight in 10/2015 was 168-170. She says she is upset because she had seen her PCP for swelling and was told she was just getting fat. Max weight in the hospital was 224. Down to 192 today. Continues with brisk diuresis.  No BRBPR, melena, hematemesis or hematuria reported.   Objective: Telemetry: PAF/flutter (mostly flutter), pulse currently 95-105 Physical Exam: Blood pressure 138/83, pulse (!) 114, temperature 98.4 F (36.9 C), temperature source Oral, resp. rate 17, height 5\' 3"  (1.6 m), weight 192 lb 3.9 oz (87.2 kg), SpO2 92 %. Body mass index is 34.05 kg/m. General: Well developed, well nourished obese WF, in no acute distress. Head: Normocephalic, atraumatic, sclera non-icteric, no xanthomas, nares are without discharge. Neck: JVP mod elevated. Lungs:  Diminished throughout wheezes, rales, or rhonchi. Breathing is unlabored. Heart: Irregularly, mildly elevated rate, S1 S2 without murmurs, rubs, or gallops.  Abdomen: Soft, non-tender, non-distended with normoactive bowel sounds. No rebound/guarding. Extremities: No clubbing or cyanosis. 1+ LLE, mildly reddened, thicken skin changes indicative of chronic edemaotous changes recently. S/p R BKA. Large ecchymosis R forearm previously noted as well. Neuro: Alert and oriented X 3. Moves all extremities spontaneously. Psych:  Responds to questions appropriately with a normal affect.   Intake/Output Summary (Last 24 hours) at 04/15/16 1238 Last data filed at 04/15/16 1200  Gross per 24 hour  Intake           751.46 ml  Output             6000 ml  Net         -5248.54 ml    Inpatient Medications:  . antiseptic oral rinse  7 mL Mouth Rinse q12n4p  . atorvastatin  20 mg Oral q1800  . budesonide (PULMICORT) nebulizer solution  0.5 mg Nebulization BID  . citalopram  20 mg Oral Daily  .  digoxin  0.125 mg Intravenous Daily  . diltiazem  120 mg Oral Daily  . furosemide  60 mg Intravenous BID  . gabapentin  300 mg Oral BID  . insulin aspart  0-15 Units Subcutaneous TID WC  . levalbuterol  1.25 mg Nebulization TID  . [START ON 04/16/2016] magnesium oxide  400 mg Oral BID  . magnesium sulfate 1 - 4 g bolus IVPB  2 g Intravenous Once  . pantoprazole  40 mg Oral Q1200  . potassium chloride  20 mEq Oral Daily   Infusions:  . heparin 1,250 Units/hr (04/14/16 2000)    Labs:  Recent Labs  04/14/16 0349 04/15/16 0609  NA 140 137  K 4.0 3.8  CL 90* 90*  CO2 42* 41*  GLUCOSE 123* 137*  BUN 15 13  CREATININE 1.14* 1.10*  CALCIUM 9.6 9.5  MG 1.5* 1.4*   No results for input(s): AST, ALT, ALKPHOS, BILITOT, PROT, ALBUMIN in the last 72 hours.  Recent Labs  04/14/16 0349 04/15/16 0609  WBC 6.9 6.6  NEUTROABS 3.6 3.7  HGB 9.3* 9.0*  HCT 33.5* 32.0*  MCV 83.3 83.8  PLT 201 200   No results for input(s): CKTOTAL, CKMB, TROPONINI in the last 72 hours. Invalid input(s): POCBNP No results for input(s): HGBA1C in the last 72 hours.   Radiology/Studies:  Ct Abdomen Pelvis Wo Contrast  Result Date: 04/08/2016 CLINICAL DATA:  Altered mental status with generalized body edema and facial cyanosis. EXAM: CT ABDOMEN AND PELVIS  WITHOUT CONTRAST TECHNIQUE: Multidetector CT imaging of the abdomen and pelvis was performed following the standard protocol without IV contrast. COMPARISON:  CT 12/20/2012.  Ultrasound 12/26/2013. FINDINGS: Lower chest: There are moderate size dependent pleural effusions, larger on the right. No significant pericardial effusion. There is atelectasis at both lung bases, worst in the right lower lobe. Hepatobiliary: The liver appears unremarkable as imaged in the noncontrast state. Previous cholecystectomy without evidence of significant biliary dilatation. Pancreas: The pancreas appears mildly atrophy, but unchanged. No focal surrounding inflammation. Spleen:  Normal in size without focal abnormality. There is a stable small splenule. Adrenals/Urinary Tract: Both adrenal glands appear normal. Both kidneys appear unremarkable as imaged in the noncontrast state. No evidence of urinary tract calculus or hydronephrosis. The bladder is partially collapsed, likely accounting for mild prominence of its wall. Stomach/Bowel: No evidence of bowel wall thickening, distention or surrounding inflammatory change. Retrocecal surgical clips consistent with previous appendectomy. Vascular/Lymphatic: There are no enlarged abdominal or pelvic lymph nodes. Small retroperitoneal lymph nodes are not significantly enlarged. Stable aortic and branch vessel atherosclerosis. Reproductive: The uterus and ovaries appear stable. No evidence of adnexal mass. Other: Generalized anasarca with extensive subcutaneous edema. There is lesser mesenteric and retroperitoneal edema. A small amount of ascites is present. Musculoskeletal: No acute or significant osseous findings. Postsurgical changes status post lower lumbar fusion. There are injection granulomas in the buttocks bilaterally. IMPRESSION: 1. Anasarca with generalized soft tissue edema and moderate size bilateral pleural effusions. Mild ascites. 2. No acute intra-abdominal findings identified. 3. Aortic atherosclerosis and postsurgical changes as described. Electronically Signed   By: Richardean Sale M.D.   On: 04/08/2016 18:00  Ct Head Wo Contrast  Result Date: 04/08/2016 CLINICAL DATA:  Altered mental status and cyanosis EXAM: CT HEAD WITHOUT CONTRAST TECHNIQUE: Contiguous axial images were obtained from the base of the skull through the vertex without intravenous contrast. COMPARISON:  04/11/2008 FINDINGS: Bony calvarium is intact. No findings to suggest acute hemorrhage, acute infarction or space-occupying mass lesion are noted. IMPRESSION: No acute abnormality noted. Electronically Signed   By: Inez Catalina M.D.   On: 04/08/2016 17:53  Dg  Chest Port 1 View  Result Date: 04/13/2016 CLINICAL DATA:  Pulmonary edema EXAM: PORTABLE CHEST 1 VIEW COMPARISON:  04/11/2016 FINDINGS: Cardiomegaly with vascular congestion and interstitial prominence, likely interstitial edema. Small right pleural effusion with right base atelectasis or infiltrate. IMPRESSION: Suspect mild interstitial edema/CHF. Persistent right basilar atelectasis or infiltrate with right effusion. Electronically Signed   By: Rolm Baptise M.D.   On: 04/13/2016 07:49  Dg Chest Port 1 View  Result Date: 04/11/2016 CLINICAL DATA:  Decreasing oxygen saturation, COPD, right pleural effusion, CHF EXAM: PORTABLE CHEST 1 VIEW COMPARISON:  Portable chest x-ray of April 09, 2016 FINDINGS: The patient is positioned in a lordotic fashion. The lungs are mildly hyperinflated. Persistent increased density at the right lung base is compatible with a posterior layering pleural effusion. There is no pneumothorax. The cardiac silhouette is enlarged. The pulmonary vascularity is less prominent today. There may be a small left pleural effusion laterally. The observed bony thorax is unremarkable. IMPRESSION: Interval improvement in pulmonary interstitial edema. Persistent moderate-sized right-sided pleural effusion layering posteriorly. A small left pleural effusion is likely present. Stable cardiomegaly. Electronically Signed   By: David  Martinique M.D.   On: 04/11/2016 07:55  Dg Chest Port 1 View  Result Date: 04/09/2016 CLINICAL DATA:  Respiratory failure, history of COPD, CHF, diabetes EXAM: PORTABLE CHEST 1 VIEW COMPARISON:  Portable chest  x-ray of April 08, 2016 FINDINGS: The lungs are reasonably well inflated. There is increased density in the right mid and lower lung worrisome for interstitial edema. Bilateral pleural effusions layering posteriorly are suspected. The cardiac silhouette is enlarged. The pulmonary vascularity is mildly engorged. There is no pneumothorax. IMPRESSION: CHF with slight  increased prominence of interstitial edema today especially on the right. Right basilar atelectasis or pneumonia. Probable smaller moderate-sized bilateral pleural effusions layering posteriorly. Electronically Signed   By: David  Martinique M.D.   On: 04/09/2016 07:33  Dg Chest Portable 1 View  Result Date: 04/08/2016 CLINICAL DATA:  Altered mental status with generalized body edema. Periorbital swelling with facial cyanosis. EXAM: PORTABLE CHEST 1 VIEW COMPARISON:  03/06/2016 and 01/07/2016. FINDINGS: 1545 hours. Overall lower lung volumes. The heart is enlarged. There is chronic vascular congestion with lesser aeration of the lung bases. A small amount of pleural fluid remains likely. There is no pneumothorax or overt pulmonary edema. The bones appear unchanged status post distal right clavicle resection. Surgical clips are present in the lower neck. IMPRESSION: Cardiomegaly with chronic vascular congestion. There is lesser aeration of the lung bases with possible bilateral pleural effusions. Electronically Signed   By: Richardean Sale M.D.   On: 04/08/2016 16:02    Assessment and Plan  28F with COPD, severe PAD s/p R fem-pop bypass followed by R BKA 2/17, carotid stenosis s/p B CEA, DM2, HTN, HL, chronic pain, chronic diastolic CHF, sinus tach versus SVT, recently chronic appearing anemia (prev normal 1 yr ago) whom we are asked to see for CHF and atrial flutter. She presented to [facility] ED 04/08/16 with AMS, generalized edema, and SOB. Workup notable for acute hypoxic and hypercarbic respiratory failure, AoCKD, lactic acidosis, and cool left foot. CT of the head which was unremarkable and CT of the abdomen and pelvis which demonstrated anasarca with mild ascites and small bilateral pleural effusions. VVS did not feel there was any evidence of acute limb ischemia. Found to have new afib/atrial flutter this admission. 2D Echo EF 50-55%, hypokinesis of anteroseptal and inferoseptal myocardium, mod RA, severe  elevated PASP 11mmHg, elevated CVP -> Dr. Haroldine Laws reviewed and felt there was RV dysfunction.  1. Acute diastolic CHF with R>L symptoms and pulm HTN on echo and WMA as above - Will need PFTs and sleep study when more stable, as well as L/RHC once more diuresed. See below re: prior CT report. Weight as low as 168lb in 10/2015, gradual uptrend since then. Since Cr is stable and she is diuresing steadily, would continue IV Lasix for now. Mg low - got 400mg  orally in the middle of the night and f/u level still 1.4. Got another dose this AM. Will give 2g mag sulfate now and start 400mg  BID in AM.  2. Acute on chronic respiratory failure - note CTA in 10/2015 showed probable combination of ILD and mild interstitial pulm edema, as well as nodule in each upper lobe, recommend f/u CT 6-12 months. ?Timing of repeat CT. This study also had noted possible changes of cirrhosis with small esophageal varices.  3. PAF/atrial flutter, newly recognized - continue heparin per pharm until all procedures complete. Will review with MD whether to increase diltiazem to 180mg  daily.  4. Severe COPD - per IM.  5. Recently chronic appearing anemia -  Notable that Hgb was much higher in 03/2015. Would appreciate further workup by IM for this. Have ordered hemoccult.  6. 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  SignedMelina Copa PA-C Pager: [phone]   Attending Note:   The patient was seen and examined.  Agree with assessment and plan as noted above.  Changes made to the above note as needed.  Patient seen and independently examined with Melina Copa, PA .   We discussed all aspects of the encounter. I agree with the assessment and plan as stated above.  Pt has Right heart failure due to her COPD.  Is diuresing well.   Continue to diurese - getting Lasix 60 IV bid     I have spent a total of 30 minutes with patient reviewing hospital  notes , telemetry,  EKGs, labs and examining patient as well as establishing an assessment and plan that was discussed with the patient. > 50% of time was spent in direct patient care.    Thayer Headings, Brooke Bonito., MD, [facility] 04/15/2016, 1:34 PM [address],  [place] Pager [phone]

## 2016-04-16 DIAGNOSIS — I4892 Unspecified atrial flutter: Secondary | ICD-10-CM

## 2016-04-16 LAB — IRON AND TIBC
Iron: 56 ug/dL (ref 28–170)
Saturation Ratios: 15 % (ref 10.4–31.8)
TIBC: 378 ug/dL (ref 250–450)
UIBC: 322 ug/dL

## 2016-04-16 LAB — RETICULOCYTES
RBC.: 3.86 MIL/uL — AB (ref 3.87–5.11)
RETIC COUNT ABSOLUTE: 142.8 10*3/uL (ref 19.0–186.0)
RETIC CT PCT: 3.7 % — AB (ref 0.4–3.1)

## 2016-04-16 LAB — CBC
HCT: 32.1 % — ABNORMAL LOW (ref 36.0–46.0)
HEMOGLOBIN: 9.2 g/dL — AB (ref 12.0–15.0)
MCH: 23.8 pg — AB (ref 26.0–34.0)
MCHC: 28.7 g/dL — AB (ref 30.0–36.0)
MCV: 83.2 fL (ref 78.0–100.0)
PLATELETS: 229 10*3/uL (ref 150–400)
RBC: 3.86 MIL/uL — AB (ref 3.87–5.11)
RDW: 21.1 % — ABNORMAL HIGH (ref 11.5–15.5)
WBC: 8.4 10*3/uL (ref 4.0–10.5)

## 2016-04-16 LAB — GLUCOSE, CAPILLARY
GLUCOSE-CAPILLARY: 150 mg/dL — AB (ref 65–99)
GLUCOSE-CAPILLARY: 172 mg/dL — AB (ref 65–99)
Glucose-Capillary: 136 mg/dL — ABNORMAL HIGH (ref 65–99)
Glucose-Capillary: 191 mg/dL — ABNORMAL HIGH (ref 65–99)

## 2016-04-16 LAB — COMPREHENSIVE METABOLIC PANEL
ALT: 13 U/L — AB (ref 14–54)
AST: 20 U/L (ref 15–41)
Albumin: 3.3 g/dL — ABNORMAL LOW (ref 3.5–5.0)
Alkaline Phosphatase: 51 U/L (ref 38–126)
Anion gap: 9 (ref 5–15)
BILIRUBIN TOTAL: 0.9 mg/dL (ref 0.3–1.2)
BUN: 12 mg/dL (ref 6–20)
CO2: 40 mmol/L — ABNORMAL HIGH (ref 22–32)
CREATININE: 1.04 mg/dL — AB (ref 0.44–1.00)
Calcium: 9.6 mg/dL (ref 8.9–10.3)
Chloride: 89 mmol/L — ABNORMAL LOW (ref 101–111)
GFR, EST NON AFRICAN AMERICAN: 55 mL/min — AB (ref >60)
Glucose, Bld: 149 mg/dL — ABNORMAL HIGH (ref 65–99)
Potassium: 3.8 mmol/L (ref 3.5–5.1)
Sodium: 138 mmol/L (ref 135–145)
TOTAL PROTEIN: 6.2 g/dL — AB (ref 6.5–8.1)

## 2016-04-16 LAB — FERRITIN: Ferritin: 106 ng/mL (ref 11–307)

## 2016-04-16 LAB — FOLATE: FOLATE: 11.4 ng/mL (ref >5.9)

## 2016-04-16 LAB — HEPARIN LEVEL (UNFRACTIONATED): Heparin Unfractionated: 0.39 IU/mL (ref 0.30–0.70)

## 2016-04-16 LAB — TSH: TSH: 4.765 u[IU]/mL — AB (ref 0.350–4.500)

## 2016-04-16 LAB — MAGNESIUM: Magnesium: 1.7 mg/dL (ref 1.7–2.4)

## 2016-04-16 LAB — VITAMIN B12: VITAMIN B 12: 676 pg/mL (ref 180–914)

## 2016-04-16 MED ORDER — DIGOXIN 125 MCG PO TABS
0.1250 mg | ORAL_TABLET | Freq: Every day | ORAL | Status: DC
  Administered 2016-04-17 – 2016-04-20 (×4): 0.125 mg via ORAL
  Filled 2016-04-16 (×4): qty 1

## 2016-04-16 MED ORDER — LEVALBUTEROL HCL 1.25 MG/0.5ML IN NEBU
1.2500 mg | INHALATION_SOLUTION | RESPIRATORY_TRACT | Status: DC | PRN
  Filled 2016-04-16: qty 0.5

## 2016-04-16 MED ORDER — MAGNESIUM SULFATE 2 GM/50ML IV SOLN
2.0000 g | Freq: Once | INTRAVENOUS | Status: AC
  Administered 2016-04-16: 2 g via INTRAVENOUS
  Filled 2016-04-16: qty 50

## 2016-04-16 MED ORDER — DILTIAZEM HCL ER COATED BEADS 180 MG PO CP24
180.0000 mg | ORAL_CAPSULE | Freq: Every day | ORAL | Status: DC
  Administered 2016-04-17: 180 mg via ORAL
  Filled 2016-04-16: qty 1

## 2016-04-16 MED ORDER — VANCOMYCIN HCL 10 G IV SOLR
1250.0000 mg | INTRAVENOUS | Status: DC
  Administered 2016-04-16: 1250 mg via INTRAVENOUS
  Filled 2016-04-16 (×2): qty 1250

## 2016-04-16 NOTE — Progress Notes (Signed)
ANTICOAGULATION CONSULT NOTE - Follow Up Consult  Pharmacy Consult for Heparin Indication: atrial fibrillation  Patient Measurements: Height: 5\' 3"  (160 cm) Weight: 190 lb 4.1 oz (86.3 kg) IBW/kg (Calculated) : 52.4 Heparin Dosing Weight: 74 kg  Vital Signs: Temp: 98.2 F (36.8 C) (08/01 0700) Temp Source: Axillary (08/01 0700) BP: 111/72 (08/01 0700) Pulse Rate: 116 (08/01 0700)  Labs:  Recent Labs  04/14/16 0349 04/15/16 0609 04/16/16 0415 04/16/16 0416  HGB 9.3* 9.0*  --  9.2*  HCT 33.5* 32.0*  --  32.1*  PLT 201 200  --  229  HEPARINUNFRC 0.56 0.54 0.39  --   CREATININE 1.14* 1.10*  --  1.04*    Estimated Creatinine Clearance: 56.2 mL/min (by C-G formula based on SCr of 1.04 mg/dL).  Assessment: 75 yof continues on heparin for afib. Heparin level remains therapeutic at 0.39. CBC is stable and no bleeding noted.  Goal of Therapy:  Heparin level 0.3-0.7 units/ml Monitor platelets by anticoagulation protocol: Yes   Plan:  - Continue heparin gtt 1250 units/hr - Daily heparin level and CBC  Salome Arnt, PharmD, BCPS Pager # [phone] 04/16/2016 9:40 AM

## 2016-04-16 NOTE — Progress Notes (Signed)
ANTIBIOTIC CONSULT NOTE - INITIAL  Pharmacy Consult for Vancomycin Indication: cellulitis  Allergies  Allergen Reactions  . Codeine Rash  . Penicillins Other (See Comments)    Has patient had a PCN reaction causing immediate rash, facial/tongue/throat swelling, SOB or lightheadedness with hypotension: unknown Has patient had a PCN reaction causing severe rash involving mucus membranes or skin necrosis: unknown Has patient had a PCN reaction that required hospitalization unknown Has patient had a PCN reaction occurring within the last 10 years: unknown If all of the above answers are "NO", then may proceed with Cephalosporin use.  . Ciprofloxacin Hives    Patient Measurements: Height: 5\' 3"  (160 cm) Weight: 178 lb 9.2 oz (81 kg) IBW/kg (Calculated) : 52.4  Vital Signs: Temp: 98.2 F (36.8 C) (08/01 0700) Temp Source: Axillary (08/01 0700) BP: 122/61 (08/01 0944) Pulse Rate: 125 (08/01 0944) Intake/Output from previous day: 07/31 0701 - 08/01 0700 In: 630 [P.O.:480; I.V.:150] Out: 7025 [Urine:7025] Intake/Output from this shift: Total I/O In: 240 [P.O.:240] Out: 1350 [Urine:1350]  Labs:  Recent Labs  04/14/16 0349 04/15/16 0609 04/16/16 0416  WBC 6.9 6.6 8.4  HGB 9.3* 9.0* 9.2*  PLT 201 200 229  CREATININE 1.14* 1.10* 1.04*   Estimated Creatinine Clearance: 54.3 mL/min (by C-G formula based on SCr of 1.04 mg/dL).  Assessment: 64yof admitted with massive volume overload now developing a LLE cellulitis. She will begin vancomycin. Renal function wnl.   8/1 Vancomycin >> 7/24 Cefepime >>7/28 CHG/bactroban 7/25>>7/29  7/24 BCx: neg 7/24 UCx: Negative 7/24 MRSA PCR: pos   Goal of Therapy:  Vancomycin trough level 10-15 mcg/ml  Plan:  1) Vancomycin 1250mg  IV q24 2) Follow renal function, LOT, level if needed  Deboraha Sprang 04/16/2016,10:53 AM

## 2016-04-16 NOTE — Progress Notes (Signed)
Report called to Barnetta Chapel, RN for 3 E room 21 where the patient is transferring to.

## 2016-04-16 NOTE — Progress Notes (Signed)
RT placed patient on CPAP HS auto. Patient required 4L O2 bleed in. Patient is in no distress at this time.

## 2016-04-16 NOTE — Progress Notes (Signed)
Physical Therapy Treatment Patient Details Name: [name] MRN: [id number] DOB: Mar 11, 1951 Today's Date: 04/16/2016    History of Present Illness [name] is a 65 y.o. 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  She presented to [facility] ED 7/24 with AMS, generalized edema, and SOB.  Symptoms started earlier that morning and got to the point where she was cyanotic in her face and lips.  She later developed nausea with 3 episodes of vomiting.  In ED, she was found to have mild hypercarbia (7.23 / 64 / 100), AoCKD, lactic acidosis, troponin bump.  She had CT of the head which was unremarkable and CT of the abdomen and pelvis which demonstrated anasarca with mild ascites and small bilateral pleural effusions.  She also had left foot that was cool to touch and had no pulses palpable or dopplerable.  She was subsequently transferred to [facility] for further evaluation and management.    PT Comments    Pt progressing well with sit to stand transfers.  Pt largest limiting factor remains to be endurance during treatment session.  Pt requires encouragement and education throughout tx.    Follow Up Recommendations  SNF;Supervision/Assistance - 24 hour     Equipment Recommendations  None recommended by PT    Recommendations for Other Services       Precautions / Restrictions Precautions Precautions: Fall Precaution Comments: pt with h/o falls, R AKA    Mobility  Bed Mobility Overal bed mobility: Needs Assistance Bed Mobility: Supine to Sit     Supine to sit: Supervision;HOB elevated (with use of rocking momentum.  )     General bed mobility comments: Pt with heavy reliance on bed rail.    Transfers Overall transfer level: Needs assistance   Transfers: Sit to/from Stand Sit to Stand: Min assist         General transfer comment: Pt pulled on stedy frame into standing x 3 reps with cues for trunk and hip extension.  Pt able to transfer from various seat heights and  reports fatigue.  Cues for eccentric loading and safety with DME.    Ambulation/Gait                 Stairs            Wheelchair Mobility    Modified Rankin (Stroke Patients Only)       Balance     Sitting balance-Leahy Scale: Fair       Standing balance-Leahy Scale: Poor                      Cognition Arousal/Alertness: Awake/alert Behavior During Therapy: WFL for tasks assessed/performed Overall Cognitive Status: Within Functional Limits for tasks assessed                      Exercises      General Comments        Pertinent Vitals/Pain Pain Assessment: 0-10 Pain Score: 5  Pain Location: R hip Pain Descriptors / Indicators: Discomfort Pain Intervention(s): Repositioned    Home Living                      Prior Function            PT Goals (current goals can now be found in the care plan section) Acute Rehab PT Goals Patient Stated Goal: to go home Potential to Achieve Goals: Good Progress towards PT  goals: Progressing toward goals    Frequency  Min 3X/week    PT Plan Current plan remains appropriate    Co-evaluation             End of Session Equipment Utilized During Treatment: Gait belt;Oxygen Activity Tolerance: Patient limited by fatigue;Patient limited by pain Patient left: in chair;with call bell/phone within reach     Time: 1530-1553 PT Time Calculation (min) (ACUTE ONLY): 23 min  Charges:  $Therapeutic Activity: 23-37 mins                    G Codes:      Cristela Blue 05/06/16, 4:15 PM  Governor Rooks, PTA pager [phone]

## 2016-04-16 NOTE — Progress Notes (Signed)
[place] TEAM 1 - Stepdown/ICU TEAM  [name]  [id number] DOB: 1950-10-09 DOA: 04/08/2016 PCP: Maricela Curet, MD    Brief Narrative:  65 y.o.femalewith Hx Anxiety; CVA, HTN, HLD, PVD, Right BKA, COPD/Chronic Respiratory Failure on 2 L [place], and DM2 who presented to [facility] ED 7/24 with AMS, generalized edema, and SOB. Symptoms started earlier that morning and got to the point where she was cyanotic in her face and lips. She later developed nausea with 3 episodes of vomiting.  In the ED she was found to have mild hypercarbia (7.23 / 64 / 100),  AoCKD, lactic acidosis, and a troponin bump. CT of the head was unremarkable and CT of the abdomen and pelvis demonstrated anasarca with mild ascites and small bilateral pleural effusions. She was also noted to hav a left foot that was cool to touch with no pulses palpable or dopplerable. She was transferred to [facility] for further evaluation.  She is currently treated with iv lasix, on heparin drip, cardiology plan to do cardiac cath on 8/3 She developed some erythema left lower extremity which she states this has been going on intermittently, vanc started per cardiology recs  Subjective: The patient is transferred from 3s to 3e today, denies chest pain, no sob at rest, reports left lower extremity redness , no fever.    Assessment & Plan:  Acute hypoxic and hypercarbic respiratory failure -due to CHF exacerbation in setting of baseline O2 dependent COPD -pt now back on her home O2 regimen   Acute exacerbation of chronic diastolic congestive heart failure - pulmonary hypertension -Baseline weight 183 pounds - diuresis continues per Cardiology  -TTE this admission no significant worsening of pulmonary hypertension Filed Weights   04/15/16 0415 04/16/16 0440 04/16/16 0944  Weight: 87.2 kg (192 lb 3.9 oz) 86.3 kg (190 lb 4.1 oz) 81 kg (178 lb 9.2 oz)  on iv lasix, cardiology following, plan to cath on 8/3  Paroxysmal A fib with RVR    -still slightly tachycardia,  On cardizem/dig-  TSH elevated, free t3/free t4 pending -Heparin drip Cardiology following, plan to transition to elliquis post cath  COPD  -No evidence of acute exacerbation presently  Probable OSA / OHS -CPAP at night  Pulmonary nodules bilateral upper lobes -Noted on CT scan February 2017 with recommendation for follow-up in 6-12 months  Right pleural effusion -Secondary to fluid overload - not clinically significant at present  Cirrhosis of the liver Noted on CT scan chest February 2017 - small esophageal varices also appreciated at that time - suspect this is due to hepatic congestion related to pulmonary hypertension/CHF - interestingly not noted on CT abdomen this admission - check acute hepatitis panel to be complete - lower Tylenol dose  Cold LLE  -seen by Vascular Surgery - no emergent surgery needed  Acute on chronic renal failure  -Multifactorial Cardiomyopathy + ACEi + NSAID's,+vomiting+ grossly fluid overloaded - creatinine stable at present  Recent Labs Lab 04/12/16 0544 04/13/16 0352 04/14/16 0349 04/15/16 0609 04/16/16 0416  CREATININE 1.05* 1.12* 1.14* 1.10* 1.04*    DM2 uncontrolled  -CBG reasonably controlled at present - no change in treatment plan today  Normocytic anemia -Likely related to chronic kidney disease - no evidence of acute blood loss - check iron studies  Anxiety -Well compensated at this time  History of stroke  Hypomagnesemia -Persistent - continue to replace and follow  MRSA screen positive  DVT prophylaxis: IV heparin Code Status: FULL CODE Family Communication: no family present  at time of exam  Disposition Plan:  PT/OT - continue to diurese, eventually snf  Consultants:  [facility]  Procedures: 7/25 echocardiogram EF  50%- 55%. Hypokinesis of the anteroseptal and inferoseptal myocardium. - Right atrium: moderately dilated.- Pulmonary arteries: PApeak pressure: 68 mm Hg  (S).-- Inferior vena cava: The vessel was dilated.   Antimicrobials:  Cefepime 7/25 > 7/28 vanc from 8/1  Objective: Blood pressure 124/61, pulse (!) 121, temperature 98.3 F (36.8 C), temperature source Oral, resp. rate 18, height 5\' 3"  (1.6 m), weight 81 kg (178 lb 9.2 oz), SpO2 94 %.  Intake/Output Summary (Last 24 hours) at 04/16/16 1800 Last data filed at 04/16/16 1700  Gross per 24 hour  Intake           1137.5 ml  Output             5825 ml  Net          -4687.5 ml   Filed Weights   04/15/16 0415 04/16/16 0440 04/16/16 0944  Weight: 87.2 kg (192 lb 3.9 oz) 86.3 kg (190 lb 4.1 oz) 81 kg (178 lb 9.2 oz)    Examination: General: chronically ill appearing, No acute respiratory distress at rest in bed Lungs: diminished at basis,  without wheezes or crackles Cardiovascular: IRRR Abdomen: Nontender, nondistended, soft, bowel sounds positive, no rebound, no ascites, no appreciable mass Extremities: 1+ edema left lower extremities with erythema anterior shin, s/p right AKA  CBC:  Recent Labs Lab 04/12/16 0544 04/13/16 0352 04/14/16 0349 04/15/16 0609 04/16/16 0416  WBC 5.4 4.8 6.9 6.6 8.4  NEUTROABS 3.1 2.6 3.6 3.7  --   HGB 9.6* 9.2* 9.3* 9.0* 9.2*  HCT 33.3* 32.4* 33.5* 32.0* 32.1*  MCV 80.8 82.2 83.3 83.8 83.2  PLT 186 177 201 200 Q000111Q   Basic Metabolic Panel:  Recent Labs Lab 04/10/16 0025 04/11/16 0329  04/12/16 0544 04/13/16 0352 04/14/16 0349 04/15/16 0609 04/16/16 0416  NA 139 139  < > 139 138 140 137 138  K 4.2 4.0  < > 3.7 4.2 4.0 3.8 3.8  CL 99* 101  < > 94* 92* 90* 90* 89*  CO2 31 29  < > 36* 38* 42* 41* 40*  GLUCOSE 194* 72  < > 104* 79 123* 137* 149*  BUN 22* 14  < > 13 13 15 13 12   CREATININE 1.31* 0.95  < > 1.05* 1.12* 1.14* 1.10* 1.04*  CALCIUM 9.0 8.9  < > 9.6 9.3 9.6 9.5 9.6  MG 1.4* 1.5*  < > 1.2* 1.8 1.5* 1.4* 1.7  PHOS 2.8 2.8  --   --   --   --   --   --   < > = values in this interval not displayed. GFR: Estimated Creatinine  Clearance: 54.3 mL/min (by C-G formula based on SCr of 1.04 mg/dL).  Liver Function Tests:  Recent Labs Lab 04/16/16 0416  AST 20  ALT 13*  ALKPHOS 51  BILITOT 0.9  PROT 6.2*  ALBUMIN 3.3*   No results for input(s): LIPASE, AMYLASE in the last 168 hours. No results for input(s): AMMONIA in the last 168 hours.  Coagulation Profile: No results for input(s): INR, PROTIME in the last 168 hours.  Cardiac Enzymes: No results for input(s): CKTOTAL, CKMB, CKMBINDEX, TROPONINI in the last 168 hours.  HbA1C: Hgb A1c MFr Bld  Date/Time Value Ref Range Status  10/27/2015 02:52 PM 8.6 (H) 4.8 - 5.6 % Final    Comment:    (  NOTE)         Pre-diabetes: 5.7 - 6.4         Diabetes: >6.4         Glycemic control for adults with diabetes: <7.0   08/17/2015 09:45 PM 7.8 (H) 4.8 - 5.6 % Final    Comment:    (NOTE)         Pre-diabetes: 5.7 - 6.4         Diabetes: >6.4         Glycemic control for adults with diabetes: <7.0     CBG:  Recent Labs Lab 04/15/16 1635 04/15/16 2109 04/16/16 0728 04/16/16 1141 04/16/16 1714  GLUCAP 198* 189* 136* 150* 172*    Recent Results (from the past 240 hour(s))  Blood culture (routine x 2)     Status: None   Collection Time: 04/08/16  4:58 PM  Result Value Ref Range Status   Specimen Description BLOOD RIGHT ARM  Final   Special Requests BOTTLES DRAWN AEROBIC AND ANAEROBIC 5CC EACH  Final   Culture NO GROWTH 5 DAYS  Final   Report Status 04/13/2016 FINAL  Final  Blood culture (routine x 2)     Status: None   Collection Time: 04/08/16  5:03 PM  Result Value Ref Range Status   Specimen Description BLOOD RIGHT HAND  Final   Special Requests BOTTLES DRAWN AEROBIC ONLY 4CC  Final   Culture NO GROWTH 5 DAYS  Final   Report Status 04/13/2016 FINAL  Final  Urine culture     Status: None   Collection Time: 04/08/16  6:00 PM  Result Value Ref Range Status   Specimen Description URINE, CLEAN CATCH  Final   Special Requests NONE  Final    Culture NO GROWTH Performed at [facility]   Final   Report Status 04/10/2016 FINAL  Final  MRSA PCR Screening     Status: Abnormal   Collection Time: 04/08/16  8:07 PM  Result Value Ref Range Status   MRSA by PCR POSITIVE (A) NEGATIVE Final    Comment:        The GeneXpert MRSA Assay (FDA approved for NASAL specimens only), is one component of a comprehensive MRSA colonization surveillance program. It is not intended to diagnose MRSA infection nor to guide or monitor treatment for MRSA infections. RESULT CALLED TO, READ BACK BY AND VERIFIED WITH: PAULA TRIVETTE,RN 2308 04/08/16 MKELLY      Scheduled Meds: . antiseptic oral rinse  7 mL Mouth Rinse q12n4p  . atorvastatin  20 mg Oral q1800  . budesonide (PULMICORT) nebulizer solution  0.5 mg Nebulization BID  . citalopram  20 mg Oral Daily  . [START ON 04/17/2016] digoxin  0.125 mg Oral Daily  . [START ON 04/17/2016] diltiazem  180 mg Oral Daily  . furosemide  60 mg Intravenous BID  . gabapentin  300 mg Oral BID  . insulin aspart  0-15 Units Subcutaneous TID WC  . levalbuterol  1.25 mg Nebulization TID  . magnesium oxide  400 mg Oral BID  . pantoprazole  40 mg Oral Q1200  . potassium chloride  20 mEq Oral Daily  . vancomycin  1,250 mg Intravenous Q24H   Continuous Infusions: . heparin 1,250 Units/hr (04/16/16 0600)     LOS: 8 days    Florencia Reasons, MD PhD [place]  [phone] Pager - Text Page per Amion as per below:  On-Call/Text Page:      [email or web address]  password [email or web address]  If 7PM-7AM, please contact night-coverage [email or web address] Password [email or web address] 04/16/2016, 6:00 PM

## 2016-04-16 NOTE — Progress Notes (Signed)
Advanced Heart Failure Rounding Note  Referring Physician: Dia Crawford Primary Cardiologist: Nahser Reason for Consultation: SVT  Subjective:    Continues to diurese vigorously.  HR remains in 120s. Breathing continues to improve.   Out 6.6 L yesterday.  Weight shows down 14 lbs. Creatinine and K stable. Mg 1.7. In for supp   Echo 04/09/16:  EF 50-55% with anteroseptal and interoseptal HK.  RV dilated and moderately HY  RVSP 68   Objective:   Weight Range: 178 lb 9.2 oz (81 kg) Body mass index is 31.63 kg/m.   Vital Signs:   Temp:  [98.2 F (36.8 C)-99.1 F (37.3 C)] 98.2 F (36.8 C) (08/01 0700) Pulse Rate:  [79-125] 125 (08/01 0944) Resp:  [12-24] 18 (08/01 0944) BP: (111-124)/(56-72) 122/61 (08/01 0944) SpO2:  [89 %-97 %] 91 % (08/01 0944) Weight:  [178 lb 9.2 oz (81 kg)-190 lb 4.1 oz (86.3 kg)] 178 lb 9.2 oz (81 kg) (08/01 0944) Last BM Date:  (04/08/16)  Weight change: Filed Weights   04/15/16 0415 04/16/16 0440 04/16/16 0944  Weight: 192 lb 3.9 oz (87.2 kg) 190 lb 4.1 oz (86.3 kg) 178 lb 9.2 oz (81 kg)    Intake/Output:   Intake/Output Summary (Last 24 hours) at 04/16/16 1052 Last data filed at 04/16/16 0948  Gross per 24 hour  Intake              870 ml  Output             7325 ml  Net            -6455 ml     Physical Exam: General: Sitting up in bed. NAD HEENT: normal Neck: supple. JVD elevated,  Bilateral CEA scars . No thyromegaly or nodule noted.  Cor: PMI nonpalpable . Irregular.  Lungs: Diminished, but clear. Abdomen: obese soft, NT, mildly distended, no HSM. No bruits or masses. +BS  Extremities: no cyanosis, clubbing, rash,  s/p R BKA  1+  woody edema / large ecchymosis on R forearm. LLE with Cellulitic changes.  Neuro: alert & oriented x 3, cranial nerves grossly intact. moves all 4 extremities w/o difficulty. Affect pleasant.  Telemetry: AF 120s  Labs: CBC  Recent Labs  04/14/16 0349 04/15/16 0609 04/16/16 0416  WBC 6.9 6.6 8.4   NEUTROABS 3.6 3.7  --   HGB 9.3* 9.0* 9.2*  HCT 33.5* 32.0* 32.1*  MCV 83.3 83.8 83.2  PLT 201 200 Q000111Q   Basic Metabolic Panel  Recent Labs  04/15/16 0609 04/16/16 0416  NA 137 138  K 3.8 3.8  CL 90* 89*  CO2 41* 40*  GLUCOSE 137* 149*  BUN 13 12  CREATININE 1.10* 1.04*  CALCIUM 9.5 9.6  MG 1.4* 1.7   Liver Function Tests  Recent Labs  04/16/16 0416  AST 20  ALT 13*  ALKPHOS 51  BILITOT 0.9  PROT 6.2*  ALBUMIN 3.3*   No results for input(s): LIPASE, AMYLASE in the last 72 hours. Cardiac Enzymes No results for input(s): CKTOTAL, CKMB, CKMBINDEX, TROPONINI in the last 72 hours.  BNP: BNP (last 3 results)  Recent Labs  01/07/16 2235 03/06/16 2134 04/08/16 1531  BNP 527.0* 653.0* 920.0*    ProBNP (last 3 results) No results for input(s): PROBNP in the last 8760 hours.   D-Dimer No results for input(s): DDIMER in the last 72 hours. Hemoglobin A1C No results for input(s): HGBA1C in the last 72 hours. Fasting Lipid Panel No results for input(s):  CHOL, HDL, LDLCALC, TRIG, CHOLHDL, LDLDIRECT in the last 72 hours. Thyroid Function Tests  Recent Labs  04/16/16 0416  TSH 4.765*    Other results:     Imaging/Studies:   No results found.  Latest Echo  Latest Cath   Medications:     Scheduled Medications: . antiseptic oral rinse  7 mL Mouth Rinse q12n4p  . atorvastatin  20 mg Oral q1800  . budesonide (PULMICORT) nebulizer solution  0.5 mg Nebulization BID  . citalopram  20 mg Oral Daily  . [START ON 04/17/2016] digoxin  0.125 mg Oral Daily  . diltiazem  120 mg Oral Daily  . furosemide  60 mg Intravenous BID  . gabapentin  300 mg Oral BID  . insulin aspart  0-15 Units Subcutaneous TID WC  . levalbuterol  1.25 mg Nebulization TID  . magnesium oxide  400 mg Oral BID  . magnesium sulfate 1 - 4 g bolus IVPB  2 g Intravenous Once  . pantoprazole  40 mg Oral Q1200  . potassium chloride  20 mEq Oral Daily     Infusions: . heparin 1,250  Units/hr (04/16/16 0600)     PRN Medications:  oxyCODONE, pneumococcal 13-valent conjugate vaccine, sodium chloride   Assessment   1. Acute diastolic HF with R>>L symptoms and pulmonary HTN on echo  2. Acute on chronic respiratory failure 3. PAF/atrial flutter apparently new onset     This patients CHA2DS2-VASc Score and unadjusted Ischemic Stroke Rate (% per year) is equal to 7.2 % stroke rate/year from a score of 5 4. PAD s/p R BKA 5. Abnormal echo with anteroseptal/inferoseptal HK, RV HK and pulmonary HTN 6. Severe COPD 7. DM2 8. Cellulitis   Plan    Continues to diurese well.  She will need at least one more day of IV lasix. With continued diuresis, could consider transition to po diuretics in am.  Previously perceived baseline weight 183. 178 today.   Will need R/LHC. Likely Thursday.   Will cover LLE cellulitis with vancomycin.   She continues to have rates up into the 120s. Will increase cardizem to 180.   Length of Stay: 8 Shirley Friar PA-C 04/16/2016, 10:52 AM Advanced Heart Failure Team Pager [phone] (M-F; 7a - 4p)  Please contact [facility] for night-coverage after hours (4p -7a ) and weekends on [email or web address]  Patient seen and examined with Oda Kilts, PA-C. We discussed all aspects of the encounter. I agree with the assessment and plan as stated above.   Continues to diurese well. Needs one more day of IV diuresis. Will plan R/L heart cath on Thursday am to assess pulmonary HTN and coronary status.   Has LE cellulitis. Will start IV vancomycin for now.   Aflutter rate back up. Will increase cardizem. Continue heparin. Switch to Eliquis after cath.   Bensimhon, Daniel,MD 3:58 PM

## 2016-04-16 NOTE — Progress Notes (Signed)
Dr. Erlinda Hong text paged of my concern for increased swelling and increased pinkness to LLE compared to Sunday.

## 2016-04-17 ENCOUNTER — Telehealth (HOSPITAL_COMMUNITY): Payer: Self-pay | Admitting: Physical Therapy

## 2016-04-17 DIAGNOSIS — D649 Anemia, unspecified: Secondary | ICD-10-CM

## 2016-04-17 LAB — GLUCOSE, CAPILLARY
GLUCOSE-CAPILLARY: 246 mg/dL — AB (ref 65–99)
Glucose-Capillary: 206 mg/dL — ABNORMAL HIGH (ref 65–99)
Glucose-Capillary: 175 mg/dL — ABNORMAL HIGH (ref 65–99)
Glucose-Capillary: 193 mg/dL — ABNORMAL HIGH (ref 65–99)

## 2016-04-17 LAB — PROTIME-INR
INR: 1.15
PROTHROMBIN TIME: 14.8 s (ref 11.4–15.2)

## 2016-04-17 LAB — BASIC METABOLIC PANEL
ANION GAP: 17 — AB (ref 5–15)
BUN: 13 mg/dL (ref 6–20)
CALCIUM: 9.8 mg/dL (ref 8.9–10.3)
CO2: 32 mmol/L (ref 22–32)
CREATININE: 1.61 mg/dL — AB (ref 0.44–1.00)
Chloride: 85 mmol/L — ABNORMAL LOW (ref 101–111)
GFR calc Af Amer: 38 mL/min — ABNORMAL LOW (ref >60)
GFR, EST NON AFRICAN AMERICAN: 33 mL/min — AB (ref >60)
GLUCOSE: 239 mg/dL — AB (ref 65–99)
Potassium: 4.3 mmol/L (ref 3.5–5.1)
Sodium: 134 mmol/L — ABNORMAL LOW (ref 135–145)

## 2016-04-17 LAB — HEPATITIS PANEL, ACUTE
HEP B S AG: NEGATIVE
Hep A IgM: NEGATIVE
Hep B C IgM: NEGATIVE

## 2016-04-17 LAB — MAGNESIUM: MAGNESIUM: 2 mg/dL (ref 1.7–2.4)

## 2016-04-17 LAB — T4, FREE: Free T4: 1.38 ng/dL — ABNORMAL HIGH (ref 0.61–1.12)

## 2016-04-17 LAB — HEPARIN LEVEL (UNFRACTIONATED): HEPARIN UNFRACTIONATED: 0.45 [IU]/mL (ref 0.30–0.70)

## 2016-04-17 MED ORDER — SODIUM CHLORIDE 0.9 % IV SOLN
INTRAVENOUS | Status: DC
  Administered 2016-04-17 – 2016-04-18 (×3): via INTRAVENOUS
  Administered 2016-04-18: 1000 mL via INTRAVENOUS

## 2016-04-17 MED ORDER — SODIUM CHLORIDE 0.9% FLUSH
3.0000 mL | Freq: Two times a day (BID) | INTRAVENOUS | Status: DC
  Administered 2016-04-17 – 2016-04-18 (×3): 3 mL via INTRAVENOUS

## 2016-04-17 MED ORDER — VANCOMYCIN HCL IN DEXTROSE 750-5 MG/150ML-% IV SOLN
750.0000 mg | INTRAVENOUS | Status: DC
  Administered 2016-04-17: 750 mg via INTRAVENOUS
  Filled 2016-04-17 (×2): qty 150

## 2016-04-17 MED ORDER — SODIUM CHLORIDE 0.9 % IV SOLN: INTRAVENOUS | Status: DC

## 2016-04-17 MED ORDER — ASPIRIN 81 MG PO CHEW
81.0000 mg | CHEWABLE_TABLET | ORAL | Status: AC
  Administered 2016-04-18: 81 mg via ORAL
  Filled 2016-04-17: qty 1

## 2016-04-17 MED ORDER — SODIUM CHLORIDE 0.9 % IV SOLN: 250.0000 mL | INTRAVENOUS | Status: DC | PRN

## 2016-04-17 MED ORDER — SODIUM CHLORIDE 0.9% FLUSH: 3.0000 mL | INTRAVENOUS | Status: DC | PRN

## 2016-04-17 NOTE — Progress Notes (Signed)
Physical Therapy Treatment Patient Details Name: [name] MRN: [id number] DOB: 01/22/1951 Today's Date: 04/17/2016    History of Present Illness [name] is a 65 y.o. 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  She presented to [facility] ED 7/24 with AMS, generalized edema, and SOB.  Symptoms started earlier that morning and got to the point where she was cyanotic in her face and lips.  She later developed nausea with 3 episodes of vomiting.  In ED, she was found to have mild hypercarbia (7.23 / 64 / 100), AoCKD, lactic acidosis, troponin bump.  She had CT of the head which was unremarkable and CT of the abdomen and pelvis which demonstrated anasarca with mild ascites and small bilateral pleural effusions.  She also had left foot that was cool to touch and had no pulses palpable or dopplerable.  She was subsequently transferred to [facility] for further evaluation and management.    PT Comments    [name] made good progress today, completing sit>stand transfer with RW and min assist.  She requires cues for hand placement and safety using RW but is motivated to get stronger and more independent.    Follow Up Recommendations  SNF;Supervision/Assistance - 24 hour     Equipment Recommendations  None recommended by PT    Recommendations for Other Services       Precautions / Restrictions Precautions Precautions: Fall Precaution Comments: pt with h/o falls, R AKA Restrictions Weight Bearing Restrictions: No    Mobility  Bed Mobility               General bed mobility comments: Pt sitting EOB upon PT arrival  Transfers Overall transfer level: Needs assistance Equipment used: Rolling walker (2 wheeled);None Transfers: Sit to/from [employer] to Stand: Min assist;Min guard Stand pivot transfers: Min assist       General transfer comment: Min guard>min assist for sit>stand using RW and cues for hand placement.  Pt pivots to [facility] without RW  and pivots on Lt LE.  Pt pivots from [facility] to chair with RW with min assist to steady.  Practiced sit>stand x4  Ambulation/Gait                 Stairs            Wheelchair Mobility    Modified Rankin (Stroke Patients Only)       Balance Overall balance assessment: Needs assistance Sitting-balance support: No upper extremity supported;Feet supported Sitting balance-Leahy Scale: Good     Standing balance support: Bilateral upper extremity supported;During functional activity Standing balance-Leahy Scale: Poor Standing balance comment: Relies on Bil UE support                     Cognition Arousal/Alertness: Awake/alert Behavior During Therapy: WFL for tasks assessed/performed Overall Cognitive Status: Within Functional Limits for tasks assessed                      Exercises      General Comments        Pertinent Vitals/Pain Pain Assessment: Faces Faces Pain Scale: Hurts little more Pain Location: Bil hips Pain Descriptors / Indicators: Aching;Discomfort Pain Intervention(s): Limited activity within patient's tolerance;Monitored during session;Repositioned    Home Living                      Prior Function  PT Goals (current goals can now be found in the care plan section) Acute Rehab PT Goals Patient Stated Goal: to go home PT Goal Formulation: With patient Time For Goal Achievement: 04/24/16 Potential to Achieve Goals: Good Progress towards PT goals: Progressing toward goals    Frequency  Min 3X/week    PT Plan Current plan remains appropriate    Co-evaluation             End of Session Equipment Utilized During Treatment: Gait belt;Oxygen Activity Tolerance: Patient limited by fatigue Patient left: in chair;with call bell/phone within reach;with chair alarm set     Time: 1411-1438 PT Time Calculation (min) (ACUTE ONLY): 27 min  Charges:  $Therapeutic Activity: 23-37 mins                     G Codes:      Collie Siad PT, DPT  Pager: [phone] Phone: [phone] 04/17/2016, 2:49 PM

## 2016-04-17 NOTE — Progress Notes (Signed)
ANTICOAGULATION CONSULT NOTE - Follow Up Consult  Pharmacy Consult for Heparin Indication: atrial fibrillation  Patient Measurements: Height: 5\' 3"  (160 cm) Weight: 172 lb 6.4 oz (78.2 kg) IBW/kg (Calculated) : 52.4 Heparin Dosing Weight: 74 kg  Vital Signs: Temp: 98.2 F (36.8 C) (08/02 0417) Temp Source: Oral (08/02 0417) BP: 113/70 (08/02 0417) Pulse Rate: 113 (08/02 0417)  Labs:  Recent Labs  04/15/16 0609 04/16/16 0415 04/16/16 0416 04/17/16 0336  HGB 9.0*  --  9.2*  --   HCT 32.0*  --  32.1*  --   PLT 200  --  229  --   HEPARINUNFRC 0.54 0.39  --  0.45  CREATININE 1.10*  --  1.04* 1.61*    Estimated Creatinine Clearance: 34.5 mL/min (by C-G formula based on SCr of 1.61 mg/dL).  Medications: Heparin @ 1250 units/hr  Assessment: 69 yof continues on heparin for afib with plans for cath tomorrow. Heparin level remains therapeutic at 0.45. No CBC today. No bleeding noted.  Goal of Therapy:  Heparin level 0.3-0.7 units/ml Monitor platelets by anticoagulation protocol: Yes   Plan:  1) Continue heparin at 1250 units/hr 2) Daily heparin level and CBC  Nena Jordan, PharmD, BCPS  04/17/2016 11:29 AM

## 2016-04-17 NOTE — Progress Notes (Signed)
Advanced Heart Failure Rounding Note  Referring Physician: Dia Crawford Primary Cardiologist: Nahser Reason for Consultation: SVT  Subjective:    Continues to diurese well. Creatinine up today, Down another 6 pounds. Weight down 52 pounds total. Breathing feels good. On IV abx for LE cellulitis. Cardizem increased yesterday HR 65-105 on tele  Echo 04/09/16:  EF 50-55% with anteroseptal and interoseptal HK.  RV dilated and moderately HY  RVSP 68   Objective:   Weight Range: 172 lb 6.4 oz (78.2 kg) Body mass index is 30.54 kg/m.   Vital Signs:   Temp:  [98.1 F (36.7 C)-98.6 F (37 C)] 98.6 F (37 C) (08/02 1256) Pulse Rate:  [90-120] 114 (08/02 1256) Resp:  [18] 18 (08/02 1256) BP: (103-130)/(47-70) 103/47 (08/02 1256) SpO2:  [90 %-100 %] 94 % (08/02 1256) Weight:  [172 lb 6.4 oz (78.2 kg)] 172 lb 6.4 oz (78.2 kg) (08/02 0417) Last BM Date: 04/16/16  Weight change: Filed Weights   04/16/16 0440 04/16/16 0944 04/17/16 0417  Weight: 190 lb 4.1 oz (86.3 kg) 178 lb 9.2 oz (81 kg) 172 lb 6.4 oz (78.2 kg)    Intake/Output:   Intake/Output Summary (Last 24 hours) at 04/17/16 1354 Last data filed at 04/17/16 1255  Gross per 24 hour  Intake             1685 ml  Output             1500 ml  Net              185 ml     Physical Exam: General: Sitting up in bed. NAD HEENT: normal Neck: supple. JVD flat,  Bilateral CEA scars . No thyromegaly or nodule noted.  Cor: PMI nonpalpable . Irregular.  Lungs: Diminished, but clear. Abdomen: obese soft, NT, mildly distended, no HSM. No bruits or masses. +BS  Extremities: no cyanosis, clubbing, rash,  s/p R BKA  trace  woody edema / large ecchymosis on R forearm. LLE with improving erythema  Neuro: alert & oriented x 3, cranial nerves grossly intact. moves all 4 extremities w/o difficulty. Affect pleasant.  Telemetry: AFL with variable AV block 65-105  Labs: CBC  Recent Labs  04/15/16 0609 04/16/16 0416  WBC 6.6 8.4    NEUTROABS 3.7  --   HGB 9.0* 9.2*  HCT 32.0* 32.1*  MCV 83.8 83.2  PLT 200 Q000111Q   Basic Metabolic Panel  Recent Labs  04/16/16 0416 04/17/16 0336  NA 138 134*  K 3.8 4.3  CL 89* 85*  CO2 40* 32  GLUCOSE 149* 239*  BUN 12 13  CREATININE 1.04* 1.61*  CALCIUM 9.6 9.8  MG 1.7 2.0   Liver Function Tests  Recent Labs  04/16/16 0416  AST 20  ALT 13*  ALKPHOS 51  BILITOT 0.9  PROT 6.2*  ALBUMIN 3.3*   No results for input(s): LIPASE, AMYLASE in the last 72 hours. Cardiac Enzymes No results for input(s): CKTOTAL, CKMB, CKMBINDEX, TROPONINI in the last 72 hours.  BNP: BNP (last 3 results)  Recent Labs  01/07/16 2235 03/06/16 2134 04/08/16 1531  BNP 527.0* 653.0* 920.0*    ProBNP (last 3 results) No results for input(s): PROBNP in the last 8760 hours.   D-Dimer No results for input(s): DDIMER in the last 72 hours. Hemoglobin A1C No results for input(s): HGBA1C in the last 72 hours. Fasting Lipid Panel No results for input(s): CHOL, HDL, LDLCALC, TRIG, CHOLHDL, LDLDIRECT in the last 72 hours.  Thyroid Function Tests  Recent Labs  04/16/16 0416  TSH 4.765*    Other results:     Imaging/Studies:  No results found.  Latest Echo  Latest Cath   Medications:     Scheduled Medications: . antiseptic oral rinse  7 mL Mouth Rinse q12n4p  . atorvastatin  20 mg Oral q1800  . budesonide (PULMICORT) nebulizer solution  0.5 mg Nebulization BID  . citalopram  20 mg Oral Daily  . digoxin  0.125 mg Oral Daily  . diltiazem  180 mg Oral Daily  . gabapentin  300 mg Oral BID  . insulin aspart  0-15 Units Subcutaneous TID WC  . levalbuterol  1.25 mg Nebulization TID  . magnesium oxide  400 mg Oral BID  . pantoprazole  40 mg Oral Q1200  . vancomycin  750 mg Intravenous Q24H    Infusions: . sodium chloride 75 mL/hr at 04/17/16 1126  . heparin 1,250 Units/hr (04/16/16 2259)    PRN Medications: levalbuterol, oxyCODONE, pneumococcal 13-valent conjugate  vaccine, sodium chloride   Assessment   1. Acute diastolic HF with R>>L symptoms and pulmonary HTN on echo  2. Acute on chronic respiratory failure 3. PAF/atrial flutter apparently new onset     This patients CHA2DS2-VASc Score and unadjusted Ischemic Stroke Rate (% per year) is equal to 7.2 % stroke rate/year from a score of 5 4. PAD s/p R BKA 5. Abnormal echo with anteroseptal/inferoseptal HK, RV HK and pulmonary HTN 6. Severe COPD 7. DM2 8. Cellulitis 9. AKI  10. Hypomagnesemia  Plan    She is fully diuresed. Creatinine now up slightly. Stop IV lasix. Will give back 500cc fluid to prepare kidneys for cath tomorrow. If renal function ok plan R/L cath tomorrow to assess coronaries and PAH. Mag being supped.   Continue vancomycin for LLE cellulitis.  AFL rate improved. Continue cardizem 180 daily. Can titrate as needed. Continue heparin for now. Eliquis after cath. Consider DC-CV in 4-6 weeks.   Length of Stay: 9 Kiree Dejarnette MD 04/17/2016, 1:54 PM Advanced Heart Failure Team Pager [phone] (M-F; [place])  Please contact [facility] for night-coverage after hours (4p -7a ) and weekends on [email or web address]

## 2016-04-17 NOTE — Progress Notes (Addendum)
PROGRESS NOTE    [name]  [id number] DOB: November 28, 1950 DOA: 04/08/2016 PCP: Isabella Stalling, MD   Brief Narrative:  65 y.o.femalewith HxAnxiety; CVA, HTN, HLD, PVD, Right BKA, COPD/Chronic Respiratory Failure on 2 L [place], and DM2 who presented to [facility] ED 7/24 with AMS, generalized edema, and SOB. Symptoms started earlier that morning and got to the point where she was cyanotic in her face and lips. She later developed nausea with 3 episodes of vomiting.  In the ED she was found to have mild hypercarbia (7.23 / 64 / 100),  AoCKD, lactic acidosis, and a troponin bump. CT of the head was unremarkable and CT of the abdomen and pelvis demonstrated anasarca with mild ascites and small bilateral pleural effusions. She was also noted to hav a left foot that was cool to touch with no pulses palpable or dopplerable. She was transferred to [facility] for further evaluation.  She is currently treated with iv lasix, on heparin drip, cardiology plan to do cardiac cath on 8/3 She developed some erythema left lower extremity which she states this has been going on intermittently, vanc started per cardiology recs Assessment & Plan   Acute hypoxic and hypercarbic respiratory failure -due to CHF exacerbation in setting of baseline O2 dependent COPD -patient is now back on her home O2 regimen   Acute exacerbation of chronic diastolic congestive heart failure - pulmonary hypertension -Baseline weight 183 pounds - diuresis continues per Cardiology  -TTE this admission no significant worsening of pulmonary hypertension on iv lasix, cardiology following, plan to cath on 8/3  Paroxysmal A fib with RVR  -CHADSVASC 5 -still slightly tachycardia,  On cardizem/dig -TSH elevated, FT4 1.38 -Heparin drip -Cardiology following, plan to transition to elliquis post cath  COPD  -No evidence of acute exacerbation presently  Probable OSA / OHS -CPAP at night  Pulmonary nodules bilateral upper  lobes -Noted on CT scan February 2017 with recommendation for follow-up in 6-12 months  Right pleural effusion -Secondary to fluid overload - not clinically significant at present  Cirrhosis of the liver -Noted on CT scan chest February 2017  -small esophageal varices also appreciated at that time  -suspect this is due to hepatic congestion related to pulmonary hypertension/CHF - interestingly not noted on CT abdomen this admission  -Hepatitis panel unremarkable  Cold LLE -seen by Vascular Surgery - no emergent surgery needed  Acute on chronic renal failure  -Multifactorial Cardiomyopathy + ACEi + NSAID's,+vomiting+ grossly fluid overloaded present -Creatinine 1.61, continue to monitor BMP  Diabetes mellitus, type II  -A1c 10/27/2015 8.6 -Continue insulin sliding scale and CBG monitoring  Normocytic anemia -Likely related to chronic kidney disease  -no evidence of acute blood loss  -Anemia panel shows adequate iron and stores -Continue to monitor CBC  Anxiety -Stable  History of stroke  Hypomagnesemia -Magnesium today 2, continue to monitor and replace as needed  DVT Prophylaxis  Heparin  Code Status: Full  Family Communication: Husband at bedside  Disposition Plan: Admitted, pending RHC on 8/3  Consultants Cardiology  Procedures  Echocardiogram  Antibiotics   Anti-infectives    Start     Dose/Rate Route Frequency Ordered Stop   04/17/16 1100  vancomycin (VANCOCIN) IVPB 750 mg/150 ml premix     750 mg 150 mL/hr over 60 Minutes Intravenous Every 24 hours 04/17/16 1011     04/16/16 1100  vancomycin (VANCOCIN) 1,250 mg in sodium chloride 0.9 % 250 mL IVPB  Status:  Discontinued     1,250 mg  166.7 mL/hr over 90 Minutes Intravenous Every 24 hours 04/16/16 1052 04/17/16 1011   04/11/16 1400  ceFEPIme (MAXIPIME) 2 g in dextrose 5 % 50 mL IVPB  Status:  Discontinued     2 g 100 mL/hr over 30 Minutes Intravenous Every 12 hours 04/11/16 1309 04/12/16 1930    04/09/16 1700  ceFEPIme (MAXIPIME) 2 g in dextrose 5 % 50 mL IVPB  Status:  Discontinued     2 g 100 mL/hr over 30 Minutes Intravenous Every 24 hours 04/09/16 1106 04/11/16 1309   04/08/16 1645  vancomycin (VANCOCIN) IVPB 1000 mg/200 mL premix  Status:  Discontinued     1,000 mg 200 mL/hr over 60 Minutes Intravenous  Once 04/08/16 1635 04/09/16 1137   04/08/16 1645  ceFEPIme (MAXIPIME) 2 g in dextrose 5 % 50 mL IVPB  Status:  Discontinued     2 g 100 mL/hr over 30 Minutes Intravenous  Once 04/08/16 1640 04/09/16 1106      Subjective:   Marcella Dubs seen and examined today. Patient feels good this morning.  Denies chest pain. Denies shortness of breat at rest, some with movement. Denies abdominal pain, nausea, vomiting, headache, dizziness.  Objective:   Vitals:   04/16/16 2215 04/17/16 0417 04/17/16 0917 04/17/16 1256  BP:  113/70  (!) 103/47  Pulse: 90 (!) 113  (!) 114  Resp: 18 18  18   Temp:  98.2 F (36.8 C)  98.6 F (37 C)  TempSrc:  Oral  Oral  SpO2: 93% 100% 98% 94%  Weight:  78.2 kg (172 lb 6.4 oz)    Height:        Intake/Output Summary (Last 24 hours) at 04/17/16 1259 Last data filed at 04/17/16 1255  Gross per 24 hour  Intake             1685 ml  Output             2100 ml  Net             -415 ml   Filed Weights   04/16/16 0440 04/16/16 0944 04/17/16 0417  Weight: 86.3 kg (190 lb 4.1 oz) 81 kg (178 lb 9.2 oz) 78.2 kg (172 lb 6.4 oz)    Exam  General: Well developed, well nourished, NAD, appears stated age  HEENT: NCAT, mucous membranes moist.   Neck: Supple, + JVD, no masses. B/L well healed scars  Cardiovascular: S1 S2 auscultated, irregular, 2/6SEM  Respiratory: diminished but clear  Abdomen: Soft, nontender, nondistended, + bowel sounds  Extremities: R AKA, LLE 1+edema  Neuro: AAOx3, nonfocal  Psych: Normal affect and demeanor with intact judgement and insight   Data Reviewed: I have personally reviewed following labs and imaging  studies  CBC:  Recent Labs Lab 04/12/16 0544 04/13/16 0352 04/14/16 0349 04/15/16 0609 04/16/16 0416  WBC 5.4 4.8 6.9 6.6 8.4  NEUTROABS 3.1 2.6 3.6 3.7  --   HGB 9.6* 9.2* 9.3* 9.0* 9.2*  HCT 33.3* 32.4* 33.5* 32.0* 32.1*  MCV 80.8 82.2 83.3 83.8 83.2  PLT 186 177 201 200 Q000111Q   Basic Metabolic Panel:  Recent Labs Lab 04/11/16 0329  04/13/16 0352 04/14/16 0349 04/15/16 0609 04/16/16 0416 04/17/16 0336  NA 139  < > 138 140 137 138 134*  K 4.0  < > 4.2 4.0 3.8 3.8 4.3  CL 101  < > 92* 90* 90* 89* 85*  CO2 29  < > 38* 42* 41* 40* 32  GLUCOSE 72  < >  79 123* 137* 149* 239*  BUN 14  < > 13 15 13 12 13   CREATININE 0.95  < > 1.12* 1.14* 1.10* 1.04* 1.61*  CALCIUM 8.9  < > 9.3 9.6 9.5 9.6 9.8  MG 1.5*  < > 1.8 1.5* 1.4* 1.7 2.0  PHOS 2.8  --   --   --   --   --   --   < > = values in this interval not displayed. GFR: Estimated Creatinine Clearance: 34.5 mL/min (by C-G formula based on SCr of 1.61 mg/dL). Liver Function Tests:  Recent Labs Lab 04/16/16 0416  AST 20  ALT 13*  ALKPHOS 51  BILITOT 0.9  PROT 6.2*  ALBUMIN 3.3*   No results for input(s): LIPASE, AMYLASE in the last 168 hours. No results for input(s): AMMONIA in the last 168 hours. Coagulation Profile: No results for input(s): INR, PROTIME in the last 168 hours. Cardiac Enzymes: No results for input(s): CKTOTAL, CKMB, CKMBINDEX, TROPONINI in the last 168 hours. BNP (last 3 results) No results for input(s): PROBNP in the last 8760 hours. HbA1C: No results for input(s): HGBA1C in the last 72 hours. CBG:  Recent Labs Lab 04/16/16 1141 04/16/16 1714 04/16/16 2148 04/17/16 0558 04/17/16 1204  GLUCAP 150* 172* 191* 206* 175*   Lipid Profile: No results for input(s): CHOL, HDL, LDLCALC, TRIG, CHOLHDL, LDLDIRECT in the last 72 hours. Thyroid Function Tests:  Recent Labs  04/16/16 0416 04/17/16 0336  TSH 4.765*  --   FREET4  --  1.38*   Anemia Panel:  Recent Labs  04/16/16 0416   VITAMINB12 676  FOLATE 11.4  FERRITIN 106  TIBC 378  IRON 56  RETICCTPCT 3.7*   Urine analysis:    Component Value Date/Time   COLORURINE YELLOW 04/08/2016 1800   APPEARANCEUR CLEAR 04/08/2016 1800   LABSPEC 1.025 04/08/2016 1800   PHURINE 5.0 04/08/2016 1800   GLUCOSEU NEGATIVE 04/08/2016 1800   HGBUR NEGATIVE 04/08/2016 1800   BILIRUBINUR SMALL (A) 04/08/2016 1800   KETONESUR TRACE (A) 04/08/2016 1800   PROTEINUR 30 (A) 04/08/2016 1800   UROBILINOGEN 1.0 04/12/2015 0915   NITRITE NEGATIVE 04/08/2016 1800   LEUKOCYTESUR NEGATIVE 04/08/2016 1800   Sepsis Labs: @LABRCNTIP (procalcitonin:4,lacticidven:4)  ) Recent Results (from the past 240 hour(s))  Blood culture (routine x 2)     Status: None   Collection Time: 04/08/16  4:58 PM  Result Value Ref Range Status   Specimen Description BLOOD RIGHT ARM  Final   Special Requests BOTTLES DRAWN AEROBIC AND ANAEROBIC 5CC EACH  Final   Culture NO GROWTH 5 DAYS  Final   Report Status 04/13/2016 FINAL  Final  Blood culture (routine x 2)     Status: None   Collection Time: 04/08/16  5:03 PM  Result Value Ref Range Status   Specimen Description BLOOD RIGHT HAND  Final   Special Requests BOTTLES DRAWN AEROBIC ONLY 4CC  Final   Culture NO GROWTH 5 DAYS  Final   Report Status 04/13/2016 FINAL  Final  Urine culture     Status: None   Collection Time: 04/08/16  6:00 PM  Result Value Ref Range Status   Specimen Description URINE, CLEAN CATCH  Final   Special Requests NONE  Final   Culture NO GROWTH Performed at [facility]   Final   Report Status 04/10/2016 FINAL  Final  MRSA PCR Screening     Status: Abnormal   Collection Time: 04/08/16  8:07 PM  Result Value Ref  Range Status   MRSA by PCR POSITIVE (A) NEGATIVE Final    Comment:        The GeneXpert MRSA Assay (FDA approved for NASAL specimens only), is one component of a comprehensive MRSA colonization surveillance program. It is not intended to diagnose  MRSA infection nor to guide or monitor treatment for MRSA infections. RESULT CALLED TO, READ BACK BY AND VERIFIED WITH: Concepcion Living [id number] 04/08/16 [facility]       Radiology Studies: No results found.   Scheduled Meds: . antiseptic oral rinse  7 mL Mouth Rinse q12n4p  . atorvastatin  20 mg Oral q1800  . budesonide (PULMICORT) nebulizer solution  0.5 mg Nebulization BID  . citalopram  20 mg Oral Daily  . digoxin  0.125 mg Oral Daily  . diltiazem  180 mg Oral Daily  . gabapentin  300 mg Oral BID  . insulin aspart  0-15 Units Subcutaneous TID WC  . levalbuterol  1.25 mg Nebulization TID  . magnesium oxide  400 mg Oral BID  . pantoprazole  40 mg Oral Q1200  . vancomycin  750 mg Intravenous Q24H   Continuous Infusions: . sodium chloride 75 mL/hr at 04/17/16 1126  . heparin 1,250 Units/hr (04/16/16 2259)     LOS: 9 days   Time Spent in minutes   30 minutes  Dainel Arcidiacono D.O. on 04/17/2016 at 12:59 PM  Between 7am to 7pm - Pager - [phone]  After 7pm go to [email or web address] - password [email or web address]  And look for the night coverage person covering for me after hours  [facility] Office  [phone]

## 2016-04-18 ENCOUNTER — Encounter (HOSPITAL_COMMUNITY)
Admission: EM | Disposition: A | Discharge status: Skilled Nursing Facility | Payer: Self-pay | Source: Home / Self Care | Attending: Internal Medicine

## 2016-04-18 ENCOUNTER — Inpatient Hospital Stay (HOSPITAL_COMMUNITY): Payer: Medicare Other

## 2016-04-18 DIAGNOSIS — N179 Acute kidney failure, unspecified: Secondary | ICD-10-CM

## 2016-04-18 DIAGNOSIS — I2510 Atherosclerotic heart disease of native coronary artery without angina pectoris: Secondary | ICD-10-CM | POA: Diagnosis present

## 2016-04-18 DIAGNOSIS — R609 Edema, unspecified: Secondary | ICD-10-CM

## 2016-04-18 DIAGNOSIS — R601 Generalized edema: Secondary | ICD-10-CM

## 2016-04-18 DIAGNOSIS — J9620 Acute and chronic respiratory failure, unspecified whether with hypoxia or hypercapnia: Secondary | ICD-10-CM | POA: Diagnosis present

## 2016-04-18 DIAGNOSIS — I25118 Atherosclerotic heart disease of native coronary artery with other forms of angina pectoris: Secondary | ICD-10-CM

## 2016-04-18 DIAGNOSIS — N189 Chronic kidney disease, unspecified: Secondary | ICD-10-CM

## 2016-04-18 DIAGNOSIS — I5033 Acute on chronic diastolic (congestive) heart failure: Secondary | ICD-10-CM | POA: Diagnosis present

## 2016-04-18 HISTORY — PX: CARDIAC CATHETERIZATION: SHX172

## 2016-04-18 LAB — POCT I-STAT 3, VENOUS BLOOD GAS (G3P V)
ACID-BASE EXCESS: 9 mmol/L — AB (ref 0.0–2.0)
Acid-Base Excess: 8 mmol/L — ABNORMAL HIGH (ref 0.0–2.0)
Acid-Base Excess: 9 mmol/L — ABNORMAL HIGH (ref 0.0–2.0)
BICARBONATE: 34.2 meq/L — AB (ref 20.0–24.0)
BICARBONATE: 35.3 meq/L — AB (ref 20.0–24.0)
BICARBONATE: 36.5 meq/L — AB (ref 20.0–24.0)
O2 Saturation: 45 %
O2 Saturation: 50 %
O2 Saturation: 40 %
PCO2 VEN: 62.3 mmHg — AB (ref 45.0–50.0)
PH VEN: 7.375 — AB (ref 7.250–7.300)
PH VEN: 7.375 — AB (ref 7.250–7.300)
PO2 VEN: 26 mmHg — AB (ref 31.0–45.0)
PO2 VEN: 28 mmHg — AB (ref 31.0–45.0)
PO2 VEN: 25 mmHg — AB (ref 31.0–45.0)
TCO2: 36 mmol/L (ref 0–100)
TCO2: 37 mmol/L (ref 0–100)
TCO2: 38 mmol/L (ref 0–100)
pCO2, Ven: 58.4 mmHg — ABNORMAL HIGH (ref 45.0–50.0)
pCO2, Ven: 60.1 mmHg — ABNORMAL HIGH (ref 45.0–50.0)
pH, Ven: 7.377 — ABNORMAL HIGH (ref 7.250–7.300)

## 2016-04-18 LAB — BASIC METABOLIC PANEL
ANION GAP: 10 (ref 5–15)
BUN: 17 mg/dL (ref 6–20)
CALCIUM: 9.4 mg/dL (ref 8.9–10.3)
CO2: 34 mmol/L — ABNORMAL HIGH (ref 22–32)
Chloride: 88 mmol/L — ABNORMAL LOW (ref 101–111)
Creatinine, Ser: 1.07 mg/dL — ABNORMAL HIGH (ref 0.44–1.00)
GFR, EST NON AFRICAN AMERICAN: 53 mL/min — AB (ref >60)
Glucose, Bld: 164 mg/dL — ABNORMAL HIGH (ref 65–99)
Potassium: 4.3 mmol/L (ref 3.5–5.1)
SODIUM: 132 mmol/L — AB (ref 135–145)

## 2016-04-18 LAB — CBC
HCT: 35.2 % — ABNORMAL LOW (ref 36.0–46.0)
HEMOGLOBIN: 10 g/dL — AB (ref 12.0–15.0)
MCH: 23.9 pg — ABNORMAL LOW (ref 26.0–34.0)
MCHC: 28.4 g/dL — AB (ref 30.0–36.0)
MCV: 84 fL (ref 78.0–100.0)
Platelets: 289 10*3/uL (ref 150–400)
RBC: 4.19 MIL/uL (ref 3.87–5.11)
RDW: 21.8 % — ABNORMAL HIGH (ref 11.5–15.5)
WBC: 12.5 10*3/uL — AB (ref 4.0–10.5)

## 2016-04-18 LAB — GLUCOSE, CAPILLARY
GLUCOSE-CAPILLARY: 165 mg/dL — AB (ref 65–99)
GLUCOSE-CAPILLARY: 200 mg/dL — AB (ref 65–99)
Glucose-Capillary: 155 mg/dL — ABNORMAL HIGH (ref 65–99)
Glucose-Capillary: 142 mg/dL — ABNORMAL HIGH (ref 65–99)

## 2016-04-18 LAB — POCT I-STAT 3, ART BLOOD GAS (G3+)
Acid-Base Excess: 7 mmol/L — ABNORMAL HIGH (ref 0.0–2.0)
Bicarbonate: 33.5 mEq/L — ABNORMAL HIGH (ref 20.0–24.0)
O2 SAT: 92 %
PCO2 ART: 53.4 mmHg — AB (ref 35.0–45.0)
PH ART: 7.406 (ref 7.350–7.450)
PO2 ART: 66 mmHg — AB (ref 80.0–100.0)
TCO2: 35 mmol/L (ref 0–100)

## 2016-04-18 LAB — POCT ACTIVATED CLOTTING TIME
ACTIVATED CLOTTING TIME: 252 s
ACTIVATED CLOTTING TIME: 378 s
ACTIVATED CLOTTING TIME: 158 s

## 2016-04-18 LAB — HEPARIN LEVEL (UNFRACTIONATED): HEPARIN UNFRACTIONATED: 0.34 [IU]/mL (ref 0.30–0.70)

## 2016-04-18 LAB — TROPONIN I: TROPONIN I: 0.05 ng/mL — AB (ref <0.03)

## 2016-04-18 LAB — T3, FREE: T3 FREE: 3.7 pg/mL (ref 2.0–4.4)

## 2016-04-18 SURGERY — RIGHT/LEFT HEART CATH AND CORONARY ANGIOGRAPHY

## 2016-04-18 MED ORDER — CLOPIDOGREL BISULFATE 75 MG PO TABS
75.0000 mg | ORAL_TABLET | Freq: Every day | ORAL | Status: DC
  Administered 2016-04-19 – 2016-04-20 (×2): 75 mg via ORAL
  Filled 2016-04-18 (×3): qty 1

## 2016-04-18 MED ORDER — FENTANYL CITRATE (PF) 100 MCG/2ML IJ SOLN
INTRAMUSCULAR | Status: AC
  Filled 2016-04-18: qty 2

## 2016-04-18 MED ORDER — SODIUM CHLORIDE 0.9% FLUSH
3.0000 mL | Freq: Two times a day (BID) | INTRAVENOUS | Status: DC
  Administered 2016-04-19 – 2016-04-20 (×2): 3 mL via INTRAVENOUS

## 2016-04-18 MED ORDER — ASPIRIN 81 MG PO CHEW
81.0000 mg | CHEWABLE_TABLET | Freq: Every day | ORAL | Status: DC
  Administered 2016-04-19 – 2016-04-20 (×2): 81 mg via ORAL
  Filled 2016-04-18 (×2): qty 1

## 2016-04-18 MED ORDER — VERAPAMIL HCL 2.5 MG/ML IV SOLN
INTRAVENOUS | Status: AC
  Filled 2016-04-18: qty 2

## 2016-04-18 MED ORDER — ACETAMINOPHEN 325 MG PO TABS: 650.0000 mg | ORAL_TABLET | ORAL | Status: DC | PRN

## 2016-04-18 MED ORDER — NITROGLYCERIN 1 MG/10 ML FOR IR/CATH LAB
INTRA_ARTERIAL | Status: DC | PRN
  Administered 2016-04-18: 200 ug via INTRACORONARY

## 2016-04-18 MED ORDER — IOPAMIDOL (ISOVUE-370) INJECTION 76%
INTRAVENOUS | Status: DC | PRN
  Administered 2016-04-18: 80 mL via INTRA_ARTERIAL

## 2016-04-18 MED ORDER — HEPARIN SODIUM (PORCINE) 1000 UNIT/ML IJ SOLN: INTRAMUSCULAR | Status: DC | PRN

## 2016-04-18 MED ORDER — HEPARIN (PORCINE) IN NACL 2-0.9 UNIT/ML-% IJ SOLN: INTRAMUSCULAR | Status: DC | PRN

## 2016-04-18 MED ORDER — MIDAZOLAM HCL 2 MG/2ML IJ SOLN
INTRAMUSCULAR | Status: DC | PRN
  Administered 2016-04-18: 1 mg via INTRAVENOUS

## 2016-04-18 MED ORDER — IOPAMIDOL (ISOVUE-370) INJECTION 76%
INTRAVENOUS | Status: AC
  Filled 2016-04-18: qty 100

## 2016-04-18 MED ORDER — SODIUM CHLORIDE 0.9 % IV SOLN
INTRAVENOUS | Status: DC | PRN
  Administered 2016-04-18: 10 mL/h via INTRAVENOUS

## 2016-04-18 MED ORDER — SODIUM CHLORIDE 0.9 % IV SOLN: INTRAVENOUS | Status: DC

## 2016-04-18 MED ORDER — MIDAZOLAM HCL 2 MG/2ML IJ SOLN
INTRAMUSCULAR | Status: AC
  Filled 2016-04-18: qty 2

## 2016-04-18 MED ORDER — HEART ATTACK BOUNCING BOOK
Freq: Once | Status: AC
  Administered 2016-04-18: 23:00:00
  Filled 2016-04-18: qty 1

## 2016-04-18 MED ORDER — ANGIOPLASTY BOOK
Freq: Once | Status: AC
  Administered 2016-04-18: 23:00:00
  Filled 2016-04-18: qty 1

## 2016-04-18 MED ORDER — NITROGLYCERIN 1 MG/10 ML FOR IR/CATH LAB
INTRA_ARTERIAL | Status: AC
  Filled 2016-04-18: qty 10

## 2016-04-18 MED ORDER — HEPARIN (PORCINE) IN NACL 2-0.9 UNIT/ML-% IJ SOLN
INTRAMUSCULAR | Status: AC
  Filled 2016-04-18: qty 1500

## 2016-04-18 MED ORDER — HEPARIN (PORCINE) IN NACL 100-0.45 UNIT/ML-% IJ SOLN: 1250.0000 [IU]/h | INTRAMUSCULAR | Status: DC

## 2016-04-18 MED ORDER — IOPAMIDOL (ISOVUE-370) INJECTION 76%
INTRAVENOUS | Status: AC
  Filled 2016-04-18: qty 50

## 2016-04-18 MED ORDER — LIVING BETTER WITH HEART FAILURE BOOK
Freq: Once | Status: AC
  Administered 2016-04-18: 23:00:00

## 2016-04-18 MED ORDER — HEPARIN SODIUM (PORCINE) 1000 UNIT/ML IJ SOLN
INTRAMUSCULAR | Status: DC | PRN
  Administered 2016-04-18: 4000 [IU] via INTRAVENOUS

## 2016-04-18 MED ORDER — SODIUM CHLORIDE 0.9 % IV SOLN: 250.0000 mL | INTRAVENOUS | Status: DC | PRN

## 2016-04-18 MED ORDER — FENTANYL CITRATE (PF) 100 MCG/2ML IJ SOLN
INTRAMUSCULAR | Status: DC | PRN
  Administered 2016-04-18: 50 ug via INTRAVENOUS

## 2016-04-18 MED ORDER — IOPAMIDOL (ISOVUE-370) INJECTION 76%
INTRAVENOUS | Status: DC | PRN
  Administered 2016-04-18: 60 mL via INTRA_ARTERIAL

## 2016-04-18 MED ORDER — DILTIAZEM HCL ER COATED BEADS 240 MG PO CP24
240.0000 mg | ORAL_CAPSULE | Freq: Every day | ORAL | Status: DC
  Administered 2016-04-18 – 2016-04-20 (×3): 240 mg via ORAL
  Filled 2016-04-18 (×3): qty 1

## 2016-04-18 MED ORDER — HEPARIN SODIUM (PORCINE) 1000 UNIT/ML IJ SOLN
INTRAMUSCULAR | Status: DC | PRN
  Administered 2016-04-18: 6000 [IU] via INTRAVENOUS
  Administered 2016-04-18: 2000 [IU] via INTRAVENOUS

## 2016-04-18 MED ORDER — VERAPAMIL HCL 2.5 MG/ML IV SOLN
INTRAVENOUS | Status: DC | PRN
  Administered 2016-04-18: 10 mL via INTRA_ARTERIAL

## 2016-04-18 MED ORDER — LIDOCAINE HCL (PF) 1 % IJ SOLN
INTRAMUSCULAR | Status: AC
  Filled 2016-04-18: qty 30

## 2016-04-18 MED ORDER — VANCOMYCIN HCL 10 G IV SOLR
1250.0000 mg | INTRAVENOUS | Status: DC
  Administered 2016-04-18 – 2016-04-19 (×2): 1250 mg via INTRAVENOUS
  Filled 2016-04-18 (×3): qty 1250

## 2016-04-18 MED ORDER — CLOPIDOGREL BISULFATE 300 MG PO TABS
ORAL_TABLET | ORAL | Status: AC
  Filled 2016-04-18: qty 2

## 2016-04-18 MED ORDER — HEPARIN (PORCINE) IN NACL 2-0.9 UNIT/ML-% IJ SOLN
INTRAMUSCULAR | Status: DC | PRN
  Administered 2016-04-18: 1500 mL

## 2016-04-18 MED ORDER — LIDOCAINE HCL (PF) 1 % IJ SOLN
INTRAMUSCULAR | Status: DC | PRN
  Administered 2016-04-18 (×2): 2 mL

## 2016-04-18 MED ORDER — CLOPIDOGREL BISULFATE 300 MG PO TABS
ORAL_TABLET | ORAL | Status: DC | PRN
  Administered 2016-04-18: 600 mg via ORAL

## 2016-04-18 MED ORDER — FENTANYL CITRATE (PF) 100 MCG/2ML IJ SOLN
INTRAMUSCULAR | Status: DC | PRN
  Administered 2016-04-18: 25 ug via INTRAVENOUS

## 2016-04-18 MED ORDER — SODIUM CHLORIDE 0.9% FLUSH
3.0000 mL | INTRAVENOUS | Status: DC | PRN
  Administered 2016-04-19 (×2): 3 mL via INTRAVENOUS
  Filled 2016-04-18 (×2): qty 3

## 2016-04-18 MED ORDER — HEPARIN SODIUM (PORCINE) 1000 UNIT/ML IJ SOLN
INTRAMUSCULAR | Status: AC
  Filled 2016-04-18: qty 1

## 2016-04-18 MED ORDER — ONDANSETRON HCL 4 MG/2ML IJ SOLN
4.0000 mg | Freq: Four times a day (QID) | INTRAMUSCULAR | Status: DC | PRN
  Filled 2016-04-18: qty 2

## 2016-04-18 SURGICAL SUPPLY — 21 items
BALLN EMERGE MR 2.5X12 (BALLOONS) ×3
BALLOON EMERGE MR 2.5X12 (BALLOONS) IMPLANT
CATH BALLN WEDGE 5F 110CM (CATHETERS) ×2 IMPLANT
CATH INFINITI 5 FR JL3.5 (CATHETERS) ×2 IMPLANT
CATH INFINITI JR4 5F (CATHETERS) ×2 IMPLANT
CATH VISTA GUIDE 6FR XBLAD3.0 (CATHETERS) ×2 IMPLANT
DEVICE RAD COMP TR BAND LRG (VASCULAR PRODUCTS) ×2 IMPLANT
GLIDESHEATH SLEND SS 6F .021 (SHEATH) ×4 IMPLANT
GUIDEWIRE .025 260CM (WIRE) ×2 IMPLANT
KIT ENCORE 26 ADVANTAGE (KITS) ×2 IMPLANT
KIT HEART LEFT (KITS) ×3 IMPLANT
PACK CARDIAC CATHETERIZATION (CUSTOM PROCEDURE TRAY) ×3 IMPLANT
SHEATH FAST CATH BRACH 5F 5CM (SHEATH) ×2 IMPLANT
STENT PROMUS PREM MR 2.5X16 (Permanent Stent) ×2 IMPLANT
SYR MEDRAD MARK V 150ML (SYRINGE) ×3 IMPLANT
TRANSDUCER W/STOPCOCK (MISCELLANEOUS) ×4 IMPLANT
TUBING CIL FLEX 10 FLL-RA (TUBING) ×3 IMPLANT
WIRE ASAHI PROWATER 180CM (WIRE) ×2 IMPLANT
WIRE HI TORQ VERSACORE J 260CM (WIRE) ×2 IMPLANT
WIRE HI TORQ VERSACORE-J 145CM (WIRE) ×2 IMPLANT
WIRE SAFE-T 1.5MM-J .035X260CM (WIRE) ×2 IMPLANT

## 2016-04-18 NOTE — Progress Notes (Signed)
PROGRESS NOTE  [name] [id number] DOB: 1951/02/05 DOA: 04/08/2016 PCP: Maricela Curet, MD  Brief History:  65 y.o.femalewith HxAnxiety; CVA, HTN, HLD, PVD, Right BKA, COPD/Chronic Respiratory Failure on 2 L [place], and DM2 who presented to [facility] ED 7/24 with AMS, generalized edema, and SOB. Symptoms started earlier that morning and got to the point where she was cyanotic in her face and lips. She later developed nausea with 3 episodes of vomiting.  In the ED she was found to have mild hypercarbia (7.23 / 64 / 100), AoCKD, lactic acidosis, and a troponin bump. CT of the head was unremarkable and CT of the abdomen and pelvis demonstrated anasarca with mild ascites and small bilateral pleural effusions. She was also noted to hav a left foot that was cool to touch with no pulses palpable or dopplerable. She was transferred to [facility] for further evaluation.  She is currently treated with iv lasix, on heparin drip, cardiology plan to do cardiac cath on 8/3 She developed some erythema left lower extremity which she states this has been going on intermittently, vanc started per cardiology recs  Assessment/Plan: Acute hypoxic and hypercarbic respiratory failure -due to CHF exacerbation in setting of baseline O2 dependent COPD -patient is now back on her home O2 regimen at 2L  Acute exacerbation of chronic diastolic congestive heart failure - pulmonary hypertension -Baseline weight 183 pounds - diuresis continues per Cardiology  -TTE this admission no significant worsening of pulmonary hypertension -IV lasix on hold due to increased creatinine and for cath 8/3  ParoxysmalA fib with RVR  -CHADSVASC 5 -still slightly tachycardia, On cardizem/dig -TSH elevated, FT4 1.38 -Heparin drip-->apixaban after cath -Cardiology following, plan to transition to elliquis post cath  New low grade fever -PCXR -UA and urine culture  L-leg cellulitis -continue vancomycin  D#3 -improved  COPD/Chronic respiratory failure with hypoxia -No evidence of acute exacerbation presently -maintained on 2 L at home  Probable OSA / OHS -CPAP at night  Pulmonary nodules bilateral upper lobes -Noted on CT scan February 2017 with recommendation for follow-up in 6-12 months  Right pleural effusion -Secondary to fluid overload - not clinically significant at present  Cirrhosis of the liver -Noted on CT scan chest February 2017  -small esophageal varices also appreciated at that time  -suspect this is due to chronic hepatic congestion related to pulmonary hypertension/CHF - interestingly not noted on CT abdomen this admission  -Viral Hepatitis panel unremarkable  Cold LLE -seen by Vascular Surgery - no emergent surgery needed  Acute on chronic renal failure  -Multifactorial Cardiomyopathy + ACEi + NSAID's,+vomiting+ grossly fluid overloaded present -Creatinine 1.61, continue to monitor BMP  Diabetes mellitus, type II  -A1c 10/27/2015 8.6 -Continue insulin sliding scale and CBG monitoring -start lantus 5 units q hs  Normocytic anemia -Likely related to chronic kidney disease  -no evidence of acute blood loss  -Anemia panel shows adequate iron sat 15%, ferritin 106 -Continue to monitor CBC  Anxiety -Stable  History of stroke  Hypomagnesemia -Magnesium today 2, continue to monitor and replace as needed    DVT Prophylaxis  IV Heparin  Code Status: Full  Family Communication: Husband at bedside  Disposition Plan: Admitted, pending [place] on 8/3  Consultants:  Cardiology  Procedures: As Listed in Progress Note Above  Antibiotics: Vancomycin 04/16/16>>>    Subjective: Overall she is breathing better. She states that her leg pain is improving with antibiotics. Denies any fall, vomiting, diarrhea,  abdominal pain. Denies any headache or visual disturbance.  Objective: Vitals:   04/18/16 1805 04/18/16 1810 04/18/16 1815 04/18/16  1819  BP: 111/66 (!) 142/74 137/71 136/75  Pulse: 77 78 77 77  Resp: 14 (!) 22 (!) 22 17  Temp:      TempSrc:      SpO2: (!) 88% 97% 98% 100%  Weight:      Height:        Intake/Output Summary (Last 24 hours) at 04/18/16 1846 Last data filed at 04/18/16 1337  Gross per 24 hour  Intake          1462.92 ml  Output              780 ml  Net           682.92 ml   Weight change: -3.2 kg (-7 lb 0.9 oz) Exam:   General:  Pt is alert, follows commands appropriately, not in acute distress  HEENT: No icterus, No thrush, No neck mass, [place]/AT  Cardiovascular: RRR, S1/S2, no rubs, no gallops  Respiratory: Diminished breath sounds at the bases without wheezing. Good air movement.  Abdomen: Soft/+BS, non tender, non distended, no guarding  Extremities: No edema, No lymphangitis, No petechiae, No rashes, no synovitis   Data Reviewed: I have personally reviewed following labs and imaging studies Basic Metabolic Panel:  Recent Labs Lab 04/13/16 0352 04/14/16 0349 04/15/16 0609 04/16/16 0416 04/17/16 0336 04/18/16 0753  NA 138 140 137 138 134* 132*  K 4.2 4.0 3.8 3.8 4.3 4.3  CL 92* 90* 90* 89* 85* 88*  CO2 38* 42* 41* 40* 32 34*  GLUCOSE 79 123* 137* 149* 239* 164*  BUN 13 15 13 12 13 17   CREATININE 1.12* 1.14* 1.10* 1.04* 1.61* 1.07*  CALCIUM 9.3 9.6 9.5 9.6 9.8 9.4  MG 1.8 1.5* 1.4* 1.7 2.0  --    Liver Function Tests:  Recent Labs Lab 04/16/16 0416  AST 20  ALT 13*  ALKPHOS 51  BILITOT 0.9  PROT 6.2*  ALBUMIN 3.3*   No results for input(s): LIPASE, AMYLASE in the last 168 hours. No results for input(s): AMMONIA in the last 168 hours. Coagulation Profile:  Recent Labs Lab 04/17/16 1813  INR 1.15   CBC:  Recent Labs Lab 04/12/16 0544 04/13/16 0352 04/14/16 0349 04/15/16 0609 04/16/16 0416 04/18/16 0753  WBC 5.4 4.8 6.9 6.6 8.4 12.5*  NEUTROABS 3.1 2.6 3.6 3.7  --   --   HGB 9.6* 9.2* 9.3* 9.0* 9.2* 10.0*  HCT 33.3* 32.4* 33.5* 32.0* 32.1* 35.2*   MCV 80.8 82.2 83.3 83.8 83.2 84.0  PLT 186 177 201 200 229 289   Cardiac Enzymes: No results for input(s): CKTOTAL, CKMB, CKMBINDEX, TROPONINI in the last 168 hours. BNP: Invalid input(s): POCBNP CBG:  Recent Labs Lab 04/17/16 1606 04/17/16 2025 04/18/16 0607 04/18/16 1139 04/18/16 1604  GLUCAP 193* 246* 165* 200* 155*   HbA1C: No results for input(s): HGBA1C in the last 72 hours. Urine analysis:    Component Value Date/Time   COLORURINE YELLOW 04/08/2016 1800   APPEARANCEUR CLEAR 04/08/2016 1800   LABSPEC 1.025 04/08/2016 1800   PHURINE 5.0 04/08/2016 1800   GLUCOSEU NEGATIVE 04/08/2016 1800   HGBUR NEGATIVE 04/08/2016 1800   BILIRUBINUR SMALL (A) 04/08/2016 1800   KETONESUR TRACE (A) 04/08/2016 1800   PROTEINUR 30 (A) 04/08/2016 1800   UROBILINOGEN 1.0 04/12/2015 0915   NITRITE NEGATIVE 04/08/2016 1800   LEUKOCYTESUR NEGATIVE 04/08/2016 1800   Sepsis  Labs: @LABRCNTIP (procalcitonin:4,lacticidven:4) ) Recent Results (from the past 240 hour(s))  MRSA PCR Screening     Status: Abnormal   Collection Time: 04/08/16  8:07 PM  Result Value Ref Range Status   MRSA by PCR POSITIVE (A) NEGATIVE Final    Comment:        The GeneXpert MRSA Assay (FDA approved for NASAL specimens only), is one component of a comprehensive MRSA colonization surveillance program. It is not intended to diagnose MRSA infection nor to guide or monitor treatment for MRSA infections. RESULT CALLED TO, READ BACK BY AND VERIFIED WITH: PAULA TRIVETTE,RN [id number] 04/08/16 MKELLY      Scheduled Meds: . [facility] Hold] antiseptic oral rinse  7 mL Mouth Rinse q12n4p  . [MAR Hold] atorvastatin  20 mg Oral q1800  . [MAR Hold] budesonide (PULMICORT) nebulizer solution  0.5 mg Nebulization BID  . [MAR Hold] citalopram  20 mg Oral Daily  . [MAR Hold] digoxin  0.125 mg Oral Daily  . [MAR Hold] diltiazem  240 mg Oral Daily  . [MAR Hold] gabapentin  300 mg Oral BID  . [MAR Hold] insulin aspart  0-15 Units  Subcutaneous TID WC  . [MAR Hold] levalbuterol  1.25 mg Nebulization TID  . [MAR Hold] magnesium oxide  400 mg Oral BID  . [MAR Hold] pantoprazole  40 mg Oral Q1200  . sodium chloride flush  3 mL Intravenous Q12H  . [MAR Hold] vancomycin  1,250 mg Intravenous Q24H   Continuous Infusions: . sodium chloride    . sodium chloride 1,000 mL (04/18/16 1455)  . heparin 1,250 Units/hr (04/17/16 2136)    Procedures/Studies: Ct Abdomen Pelvis Wo Contrast  Result Date: 04/08/2016 CLINICAL DATA:  Altered mental status with generalized body edema and facial cyanosis. EXAM: CT ABDOMEN AND PELVIS WITHOUT CONTRAST TECHNIQUE: Multidetector CT imaging of the abdomen and pelvis was performed following the standard protocol without IV contrast. COMPARISON:  CT 12/20/2012.  Ultrasound 12/26/2013. FINDINGS: Lower chest: There are moderate size dependent pleural effusions, larger on the right. No significant pericardial effusion. There is atelectasis at both lung bases, worst in the right lower lobe. Hepatobiliary: The liver appears unremarkable as imaged in the noncontrast state. Previous cholecystectomy without evidence of significant biliary dilatation. Pancreas: The pancreas appears mildly atrophy, but unchanged. No focal surrounding inflammation. Spleen: Normal in size without focal abnormality. There is a stable small splenule. Adrenals/Urinary Tract: Both adrenal glands appear normal. Both kidneys appear unremarkable as imaged in the noncontrast state. No evidence of urinary tract calculus or hydronephrosis. The bladder is partially collapsed, likely accounting for mild prominence of its wall. Stomach/Bowel: No evidence of bowel wall thickening, distention or surrounding inflammatory change. Retrocecal surgical clips consistent with previous appendectomy. Vascular/Lymphatic: There are no enlarged abdominal or pelvic lymph nodes. Small retroperitoneal lymph nodes are not significantly enlarged. Stable aortic and  branch vessel atherosclerosis. Reproductive: The uterus and ovaries appear stable. No evidence of adnexal mass. Other: Generalized anasarca with extensive subcutaneous edema. There is lesser mesenteric and retroperitoneal edema. A small amount of ascites is present. Musculoskeletal: No acute or significant osseous findings. Postsurgical changes status post lower lumbar fusion. There are injection granulomas in the buttocks bilaterally. IMPRESSION: 1. Anasarca with generalized soft tissue edema and moderate size bilateral pleural effusions. Mild ascites. 2. No acute intra-abdominal findings identified. 3. Aortic atherosclerosis and postsurgical changes as described. Electronically Signed   By: Richardean Sale M.D.   On: 04/08/2016 18:00  Ct Head Wo Contrast  Result Date: 04/08/2016 CLINICAL DATA:  Altered mental status and cyanosis EXAM: CT HEAD WITHOUT CONTRAST TECHNIQUE: Contiguous axial images were obtained from the base of the skull through the vertex without intravenous contrast. COMPARISON:  04/11/2008 FINDINGS: Bony calvarium is intact. No findings to suggest acute hemorrhage, acute infarction or space-occupying mass lesion are noted. IMPRESSION: No acute abnormality noted. Electronically Signed   By: Inez Catalina M.D.   On: 04/08/2016 17:53  Dg Chest Port 1 View  Result Date: 04/13/2016 CLINICAL DATA:  Pulmonary edema EXAM: PORTABLE CHEST 1 VIEW COMPARISON:  04/11/2016 FINDINGS: Cardiomegaly with vascular congestion and interstitial prominence, likely interstitial edema. Small right pleural effusion with right base atelectasis or infiltrate. IMPRESSION: Suspect mild interstitial edema/CHF. Persistent right basilar atelectasis or infiltrate with right effusion. Electronically Signed   By: Rolm Baptise M.D.   On: 04/13/2016 07:49  Dg Chest Port 1 View  Result Date: 04/11/2016 CLINICAL DATA:  Decreasing oxygen saturation, COPD, right pleural effusion, CHF EXAM: PORTABLE CHEST 1 VIEW COMPARISON:   Portable chest x-ray of April 09, 2016 FINDINGS: The patient is positioned in a lordotic fashion. The lungs are mildly hyperinflated. Persistent increased density at the right lung base is compatible with a posterior layering pleural effusion. There is no pneumothorax. The cardiac silhouette is enlarged. The pulmonary vascularity is less prominent today. There may be a small left pleural effusion laterally. The observed bony thorax is unremarkable. IMPRESSION: Interval improvement in pulmonary interstitial edema. Persistent moderate-sized right-sided pleural effusion layering posteriorly. A small left pleural effusion is likely present. Stable cardiomegaly. Electronically Signed   By: Audris Speaker  [place] M.D.   On: 04/11/2016 07:55  Dg Chest Port 1 View  Result Date: 04/09/2016 CLINICAL DATA:  Respiratory failure, history of COPD, CHF, diabetes EXAM: PORTABLE CHEST 1 VIEW COMPARISON:  Portable chest x-ray of April 08, 2016 FINDINGS: The lungs are reasonably well inflated. There is increased density in the right mid and lower lung worrisome for interstitial edema. Bilateral pleural effusions layering posteriorly are suspected. The cardiac silhouette is enlarged. The pulmonary vascularity is mildly engorged. There is no pneumothorax. IMPRESSION: CHF with slight increased prominence of interstitial edema today especially on the right. Right basilar atelectasis or pneumonia. Probable smaller moderate-sized bilateral pleural effusions layering posteriorly. Electronically Signed   By: Shephanie Romas  [place] M.D.   On: 04/09/2016 07:33  Dg Chest Portable 1 View  Result Date: 04/08/2016 CLINICAL DATA:  Altered mental status with generalized body edema. Periorbital swelling with facial cyanosis. EXAM: PORTABLE CHEST 1 VIEW COMPARISON:  03/06/2016 and 01/07/2016. FINDINGS: 1545 hours. Overall lower lung volumes. The heart is enlarged. There is chronic vascular congestion with lesser aeration of the lung bases. A small amount of  pleural fluid remains likely. There is no pneumothorax or overt pulmonary edema. The bones appear unchanged status post distal right clavicle resection. Surgical clips are present in the lower neck. IMPRESSION: Cardiomegaly with chronic vascular congestion. There is lesser aeration of the lung bases with possible bilateral pleural effusions. Electronically Signed   By: Richardean Sale M.D.   On: 04/08/2016 16:02   Therin Vetsch, DO  [facility] Pager [phone]  If 7PM-7AM, please contact night-coverage [email or web address] Password [email or web address] 04/18/2016, 6:46 PM   LOS: 10 days

## 2016-04-18 NOTE — Interval H&P Note (Signed)
History and Physical Interval Note:  04/18/2016 4:39 PM  [name]  has presented today for surgery, with the diagnosis of PAH. CAD.  The various methods of treatment have been discussed with the patient and family. After consideration of risks, benefits and other options for treatment, the patient has consented to  Procedure(s): Right/Left Heart Cath and Coronary Angiography (N/A) and possible coronary angioplasty as a surgical intervention .  The patient's history has been reviewed, patient examined, no change in status, stable for surgery.  I have reviewed the patient's chart and labs.  Questions were answered to the patient's satisfaction.     [name], Quillian Quince

## 2016-04-18 NOTE — Interval H&P Note (Signed)
History and Physical Interval Note:  04/18/2016 4:40 PM  [name]  has presented today for surgery, with the diagnosis of PAH, CAD  The various methods of treatment have been discussed with the patient and family. After consideration of risks, benefits and other options for treatment, the patient has consented to  Procedure(s): Right/Left Heart Cath and Coronary Angiography (N/A) and possible coronary angiopalsty Cath Lab Visit (complete for each Cath Lab visit)  Clinical Evaluation Leading to the Procedure:   ACS: No.  Non-ACS:    Anginal Classification: CCS IV  Anti-ischemic medical therapy: Minimal Therapy (1 class of medications)  Non-Invasive Test Results: No non-invasive testing performed  Prior CABG: No previous CABG      as a surgical intervention .  The patient's history has been reviewed, patient examined, no change in status, stable for surgery.  I have reviewed the patient's chart and labs.  Questions were answered to the patient's satisfaction.     [name], [name]

## 2016-04-18 NOTE — Progress Notes (Signed)
Patient placed on CPAP for the night without complication. RT will continue to monitor as needed. 

## 2016-04-18 NOTE — Progress Notes (Signed)
ANTICOAGULATION CONSULT NOTE - Follow Up Consult  Pharmacy Consult for Heparin Indication: atrial fibrillation  Patient Measurements: Height: 5\' 3"  (160 cm) Weight: 171 lb 8.3 oz (77.8 kg) IBW/kg (Calculated) : 52.4 Heparin Dosing Weight: 74 kg  Vital Signs: Temp: 97.2 F (36.2 C) (08/03 1951) Temp Source: Oral (08/03 1951) BP: 121/51 (08/03 1900) Pulse Rate: 78 (08/03 1951)  Labs:  Recent Labs  04/16/16 0415 04/16/16 0416 04/17/16 0336 04/17/16 1813 04/18/16 0753  HGB  --  9.2*  --   --  10.0*  HCT  --  32.1*  --   --  35.2*  PLT  --  229  --   --  289  LABPROT  --   --   --  14.8  --   INR  --   --   --  1.15  --   HEPARINUNFRC 0.39  --  0.45  --  0.34  CREATININE  --  1.04* 1.61*  --  1.07*    Estimated Creatinine Clearance: 51.8 mL/min (by C-G formula based on SCr of 1.07 mg/dL).  Medications: . sodium chloride 1,000 mL (04/18/16 1455)  . sodium chloride      Assessment: 49 yof continues on heparin for afib. S/p cath with pci this evening.   Orders to resume heparin 8 hours post sheath removal (~1800).  INR 1.1, HL at goal on 1250/hr - will resume at this rate.  Goal of Therapy:  Heparin level 0.3-0.7 units/ml Monitor platelets by anticoagulation protocol: Yes   Plan:  1) Resume heparin at 1250 units/hr 2) Daily heparin level and CBC 3) F/u plans for oral anticoagulation eventually.  Erin Hearing PharmD., BCPS Clinical Pharmacist Pager [phone] 04/18/2016 8:07 PM

## 2016-04-18 NOTE — Progress Notes (Signed)
ANTICOAGULATION CONSULT NOTE - Follow Up Consult  Pharmacy Consult for Heparin Indication: atrial fibrillation  Patient Measurements: Height: 5\' 3"  (160 cm) Weight: 171 lb 8.3 oz (77.8 kg) IBW/kg (Calculated) : 52.4 Heparin Dosing Weight: 74 kg  Vital Signs: Temp: 98.3 F (36.8 C) (08/03 0450) Temp Source: Oral (08/03 0450) BP: 120/69 (08/03 0450) Pulse Rate: 111 (08/03 0450)  Labs:  Recent Labs  04/16/16 0415 04/16/16 0416 04/17/16 0336 04/17/16 1813 04/18/16 0753  HGB  --  9.2*  --   --  10.0*  HCT  --  32.1*  --   --  35.2*  PLT  --  229  --   --  289  LABPROT  --   --   --  14.8  --   INR  --   --   --  1.15  --   HEPARINUNFRC 0.39  --  0.45  --  0.34  CREATININE  --  1.04* 1.61*  --  1.07*    Estimated Creatinine Clearance: 51.8 mL/min (by C-G formula based on SCr of 1.07 mg/dL).  Medications: . sodium chloride    . sodium chloride 75 mL/hr at 04/18/16 0002  . heparin 1,250 Units/hr (04/17/16 2136)    Assessment: 22 yof continues on heparin for afib with plans for cath tomorrow. Heparin level remains therapeutic at 0.34. No CBC today. No bleeding noted.  Planning R/L heart cath today.  Goal of Therapy:  Heparin level 0.3-0.7 units/ml Monitor platelets by anticoagulation protocol: Yes   Plan:  1) Continue heparin at 1250 units/hr 2) Daily heparin level and CBC 3) F/u plans for oral anticoagulation eventually.  Uvaldo Rising, BCPS  Clinical Pharmacist Pager [phone]  04/18/2016 9:24 AM

## 2016-04-18 NOTE — Progress Notes (Signed)
Advanced Heart Failure Rounding Note  Referring Physician: Dia Crawford Primary Cardiologist: Nahser Reason for Consultation: SVT  Subjective:    Yesterday diuretics held. Down another pound.  Weight down 53 pounds total.  On IV abx for LE cellulitis.   Denies SOB. Remains in AF/AFL with periods of what seems like it might be sinus tach   Echo 04/09/16:  EF 50-55% with anteroseptal and interoseptal HK.  RV dilated and moderately HY  RVSP 68   Objective:   Weight Range: 171 lb 8.3 oz (77.8 kg) Body mass index is 30.38 kg/m.   Vital Signs:   Temp:  [98.3 F (36.8 C)-100.8 F (38.2 C)] 98.3 F (36.8 C) (08/03 0450) Pulse Rate:  [84-114] 111 (08/03 0450) Resp:  [18] 18 (08/03 0450) BP: (103-120)/(47-69) 120/69 (08/03 0450) SpO2:  [94 %-99 %] 98 % (08/03 0450) Weight:  [171 lb 8.3 oz (77.8 kg)] 171 lb 8.3 oz (77.8 kg) (08/03 0450) Last BM Date: 04/17/16  Weight change: Filed Weights   04/16/16 0944 04/17/16 0417 04/18/16 0450  Weight: 178 lb 9.2 oz (81 kg) 172 lb 6.4 oz (78.2 kg) 171 lb 8.3 oz (77.8 kg)    Intake/Output:   Intake/Output Summary (Last 24 hours) at 04/18/16 0751 Last data filed at 04/18/16 0700  Gross per 24 hour  Intake           3047.5 ml  Output              780 ml  Net           2267.5 ml     Physical Exam: General: In bed. NAD HEENT: normal Neck: supple. JVD flat,  Bilateral CEA scars . No thyromegaly or nodule noted.  Cor: PMI nonpalpable . Irregular.  Lungs: Diminished, but clear. Abdomen: obese soft, NT, mildly distended, no HSM. No bruits or masses. +BS  Extremities: no cyanosis, clubbing, rash,  s/p R BKA  large ecchymosis on R forearm. LLE no erythema. Neuro: alert & oriented x 3, cranial nerves grossly intact. moves all 4 extremities w/o difficulty. Affect pleasant.  Telemetry: Sinus Tach 110s  Labs: CBC  Recent Labs  04/16/16 0416  WBC 8.4  HGB 9.2*  HCT 32.1*  MCV 83.2  PLT Q000111Q   Basic Metabolic Panel  Recent  Labs  04/16/16 0416 04/17/16 0336  NA 138 134*  K 3.8 4.3  CL 89* 85*  CO2 40* 32  GLUCOSE 149* 239*  BUN 12 13  CREATININE 1.04* 1.61*  CALCIUM 9.6 9.8  MG 1.7 2.0   Liver Function Tests  Recent Labs  04/16/16 0416  AST 20  ALT 13*  ALKPHOS 51  BILITOT 0.9  PROT 6.2*  ALBUMIN 3.3*   No results for input(s): LIPASE, AMYLASE in the last 72 hours. Cardiac Enzymes No results for input(s): CKTOTAL, CKMB, CKMBINDEX, TROPONINI in the last 72 hours.  BNP: BNP (last 3 results)  Recent Labs  01/07/16 2235 03/06/16 2134 04/08/16 1531  BNP 527.0* 653.0* 920.0*    ProBNP (last 3 results) No results for input(s): PROBNP in the last 8760 hours.   D-Dimer No results for input(s): DDIMER in the last 72 hours. Hemoglobin A1C No results for input(s): HGBA1C in the last 72 hours. Fasting Lipid Panel No results for input(s): CHOL, HDL, LDLCALC, TRIG, CHOLHDL, LDLDIRECT in the last 72 hours. Thyroid Function Tests  Recent Labs  04/16/16 0416 04/17/16 0336  TSH 4.765*  --   T3FREE  --  3.7  Other results:     Imaging/Studies:  No results found.  Latest Echo  Latest Cath   Medications:     Scheduled Medications: . antiseptic oral rinse  7 mL Mouth Rinse q12n4p  . atorvastatin  20 mg Oral q1800  . budesonide (PULMICORT) nebulizer solution  0.5 mg Nebulization BID  . citalopram  20 mg Oral Daily  . digoxin  0.125 mg Oral Daily  . diltiazem  180 mg Oral Daily  . gabapentin  300 mg Oral BID  . insulin aspart  0-15 Units Subcutaneous TID WC  . levalbuterol  1.25 mg Nebulization TID  . magnesium oxide  400 mg Oral BID  . pantoprazole  40 mg Oral Q1200  . sodium chloride flush  3 mL Intravenous Q12H  . vancomycin  750 mg Intravenous Q24H    Infusions: . sodium chloride    . sodium chloride 75 mL/hr at 04/18/16 0002  . heparin 1,250 Units/hr (04/17/16 2136)    PRN Medications: sodium chloride, levalbuterol, oxyCODONE, pneumococcal 13-valent  conjugate vaccine, sodium chloride, sodium chloride flush   Assessment   1. Acute diastolic HF with R>>L symptoms and pulmonary HTN on echo  2. Acute on chronic respiratory failure 3. PAF/atrial flutter apparently new onset     This patients CHA2DS2-VASc Score and unadjusted Ischemic Stroke Rate (% per year) is equal to 7.2 % stroke rate/year from a score of 5 4. PAD s/p R BKA 5. Abnormal echo with anteroseptal/inferoseptal HK, RV HK and pulmonary HTN 6. Severe COPD 7. DM2 8. Cellulitis 9. AKI  10. Hypomagnesemia  Plan    BMET pending. Continue IV fluids. No diuretics.  If renal function ok plan R/L cath today to assess coronaries and PAH.   Continue vancomycin for LLE cellulitis.  Looks Sinus will check EKG now. Continue cardizem 180 daily. Can titrate as needed. Continue heparin for now. Eliquis after cath. Consider DC-CV in 4-6 weeks.   Length of Stay: Stephen NP-C  04/18/2016, 7:51 AM Advanced Heart Failure Team Pager [phone] (M-F; 7a - 4p)  Please contact [facility] for night-coverage after hours (4p -7a ) and weekends on [email or web address]   Patient seen and examined with Darrick Grinder, NP. We discussed all aspects of the encounter. I agree with the assessment and plan as stated above.   Renal function improved with hydration. Cellulitis improved. Rhythm appears AF/AFL. Will plan R/L cath today. We discussed procedure.   Glori Bickers MD

## 2016-04-18 NOTE — Progress Notes (Signed)
ANTIBIOTIC CONSULT NOTE - INITIAL  Pharmacy Consult for Vancomycin Indication: cellulitis  Allergies  Allergen Reactions  . Codeine Rash  . Penicillins Other (See Comments)    Has patient had a PCN reaction causing immediate rash, facial/tongue/throat swelling, SOB or lightheadedness with hypotension: unknown Has patient had a PCN reaction causing severe rash involving mucus membranes or skin necrosis: unknown Has patient had a PCN reaction that required hospitalization unknown Has patient had a PCN reaction occurring within the last 10 years: unknown If all of the above answers are "NO", then may proceed with Cephalosporin use.  . Ciprofloxacin Hives    Patient Measurements: Height: 5\' 3"  (160 cm) Weight: 171 lb 8.3 oz (77.8 kg) IBW/kg (Calculated) : 52.4  Vital Signs: Temp: 98.3 F (36.8 C) (08/03 0450) Temp Source: Oral (08/03 0450) BP: 120/69 (08/03 0450) Pulse Rate: 111 (08/03 0450) Intake/Output from previous day: 08/02 0701 - 08/03 0700 In: 3047.5 [P.O.:1135; I.V.:1762.5; IV Piggyback:150] Out: 780 [Urine:780] Intake/Output from this shift: Total I/O In: 240 [P.O.:240] Out: -   Labs:  Recent Labs  04/16/16 0416 04/17/16 0336 04/18/16 0753  WBC 8.4  --  12.5*  HGB 9.2*  --  10.0*  PLT 229  --  289  CREATININE 1.04* 1.61* 1.07*   Estimated Creatinine Clearance: 51.8 mL/min (by C-G formula based on SCr of 1.07 mg/dL).  Assessment: 64yof admitted with massive volume overload now developing a LLE cellulitis. Initially started on vancomycin 1250 mg IV q 24 hrs, then Scr worsened, and dose decreased 8/2.  Today's Scr improved.  8/1 Vancomycin >> 7/24 Cefepime >>7/28 CHG/bactroban 7/25>>7/29  7/24 BCx: neg 7/24 UCx: Negative 7/24 MRSA PCR: pos   Goal of Therapy:  Vancomycin trough level 10-15 mcg/ml  Plan:  1) Resume Vancomycin 1250mg  IV q24 2) Follow renal function, LOT, level if needed  Uvaldo Rising, BCPS  Clinical Pharmacist Pager  [phone]  04/18/2016 11:55 AM

## 2016-04-18 NOTE — Progress Notes (Addendum)
Inpatient Diabetes Program Recommendations  AACE/ADA: New Consensus Statement on Inpatient Glycemic Control (2015)  Target Ranges:  Prepandial:   less than 140 mg/dL      Peak postprandial:   less than 180 mg/dL (1-2 hours)      Critically ill patients:  140 - 180 mg/dL   Results for ALEXX, BARMANN (MRN [id number]) as of 04/18/2016 09:36  Ref. Range 04/17/2016 05:58 04/17/2016 12:04 04/17/2016 16:06 04/17/2016 20:25 04/18/2016 06:07  Glucose-Capillary Latest Ref Range: 65 - 99 mg/dL 206 (H) 175 (H) 193 (H) 246 (H) 165 (H)   Review of Glycemic Control  Diabetes history: DM2 Outpatient Diabetes medications: Lantus 30 units QHS, Novolog 10 units TID with meals, Metformin 500 mg BID Current orders for Inpatient glycemic control: Novolog 0-15 units TID with meals  Inpatient Diabetes Program Recommendations Insulin - Basal: Please consider order low dose basal insulin. Recommend starting with Lantus 7 units Q24H (based on 77 kg x 0.1 units). Insulin-Correction: Please consider ordering Novolog bedtime correction scale.  Thanks, Barnie Alderman, RN, MSN, CDE Diabetes Coordinator Inpatient Diabetes Program [phone] (Team Pager from [place] to [place]) [phone] ([facility] office) [phone] [facility] office) [phone] [facility] office)

## 2016-04-18 NOTE — H&P (View-Only) (Signed)
Advanced Heart Failure Rounding Note  Referring Physician: Dia Crawford Primary Cardiologist: Nahser Reason for Consultation: SVT  Subjective:    Continues to diurese well. Creatinine up today, Down another 6 pounds. Weight down 52 pounds total. Breathing feels good. On IV abx for LE cellulitis. Cardizem increased yesterday HR 65-105 on tele  Echo 04/09/16:  EF 50-55% with anteroseptal and interoseptal HK.  RV dilated and moderately HY  RVSP 68   Objective:   Weight Range: 172 lb 6.4 oz (78.2 kg) Body mass index is 30.54 kg/m.   Vital Signs:   Temp:  [98.1 F (36.7 C)-98.6 F (37 C)] 98.6 F (37 C) (08/02 1256) Pulse Rate:  [90-120] 114 (08/02 1256) Resp:  [18] 18 (08/02 1256) BP: (103-130)/(47-70) 103/47 (08/02 1256) SpO2:  [90 %-100 %] 94 % (08/02 1256) Weight:  [172 lb 6.4 oz (78.2 kg)] 172 lb 6.4 oz (78.2 kg) (08/02 0417) Last BM Date: 04/16/16  Weight change: Filed Weights   04/16/16 0440 04/16/16 0944 04/17/16 0417  Weight: 190 lb 4.1 oz (86.3 kg) 178 lb 9.2 oz (81 kg) 172 lb 6.4 oz (78.2 kg)    Intake/Output:   Intake/Output Summary (Last 24 hours) at 04/17/16 1354 Last data filed at 04/17/16 1255  Gross per 24 hour  Intake             1685 ml  Output             1500 ml  Net              185 ml     Physical Exam: General: Sitting up in bed. NAD HEENT: normal Neck: supple. JVD flat,  Bilateral CEA scars . No thyromegaly or nodule noted.  Cor: PMI nonpalpable . Irregular.  Lungs: Diminished, but clear. Abdomen: obese soft, NT, mildly distended, no HSM. No bruits or masses. +BS  Extremities: no cyanosis, clubbing, rash,  s/p R BKA  trace  woody edema / large ecchymosis on R forearm. LLE with improving erythema  Neuro: alert & oriented x 3, cranial nerves grossly intact. moves all 4 extremities w/o difficulty. Affect pleasant.  Telemetry: AFL with variable AV block 65-105  Labs: CBC  Recent Labs  04/15/16 0609 04/16/16 0416  WBC 6.6 8.4    NEUTROABS 3.7  --   HGB 9.0* 9.2*  HCT 32.0* 32.1*  MCV 83.8 83.2  PLT 200 Q000111Q   Basic Metabolic Panel  Recent Labs  04/16/16 0416 04/17/16 0336  NA 138 134*  K 3.8 4.3  CL 89* 85*  CO2 40* 32  GLUCOSE 149* 239*  BUN 12 13  CREATININE 1.04* 1.61*  CALCIUM 9.6 9.8  MG 1.7 2.0   Liver Function Tests  Recent Labs  04/16/16 0416  AST 20  ALT 13*  ALKPHOS 51  BILITOT 0.9  PROT 6.2*  ALBUMIN 3.3*   No results for input(s): LIPASE, AMYLASE in the last 72 hours. Cardiac Enzymes No results for input(s): CKTOTAL, CKMB, CKMBINDEX, TROPONINI in the last 72 hours.  BNP: BNP (last 3 results)  Recent Labs  01/07/16 2235 03/06/16 2134 04/08/16 1531  BNP 527.0* 653.0* 920.0*    ProBNP (last 3 results) No results for input(s): PROBNP in the last 8760 hours.   D-Dimer No results for input(s): DDIMER in the last 72 hours. Hemoglobin A1C No results for input(s): HGBA1C in the last 72 hours. Fasting Lipid Panel No results for input(s): CHOL, HDL, LDLCALC, TRIG, CHOLHDL, LDLDIRECT in the last 72 hours.  Thyroid Function Tests  Recent Labs  04/16/16 0416  TSH 4.765*    Other results:     Imaging/Studies:  No results found.  Latest Echo  Latest Cath   Medications:     Scheduled Medications: . antiseptic oral rinse  7 mL Mouth Rinse q12n4p  . atorvastatin  20 mg Oral q1800  . budesonide (PULMICORT) nebulizer solution  0.5 mg Nebulization BID  . citalopram  20 mg Oral Daily  . digoxin  0.125 mg Oral Daily  . diltiazem  180 mg Oral Daily  . gabapentin  300 mg Oral BID  . insulin aspart  0-15 Units Subcutaneous TID WC  . levalbuterol  1.25 mg Nebulization TID  . magnesium oxide  400 mg Oral BID  . pantoprazole  40 mg Oral Q1200  . vancomycin  750 mg Intravenous Q24H    Infusions: . sodium chloride 75 mL/hr at 04/17/16 1126  . heparin 1,250 Units/hr (04/16/16 2259)    PRN Medications: levalbuterol, oxyCODONE, pneumococcal 13-valent conjugate  vaccine, sodium chloride   Assessment   1. Acute diastolic HF with R>>L symptoms and pulmonary HTN on echo  2. Acute on chronic respiratory failure 3. PAF/atrial flutter apparently new onset     This patients CHA2DS2-VASc Score and unadjusted Ischemic Stroke Rate (% per year) is equal to 7.2 % stroke rate/year from a score of 5 4. PAD s/p R BKA 5. Abnormal echo with anteroseptal/inferoseptal HK, RV HK and pulmonary HTN 6. Severe COPD 7. DM2 8. Cellulitis 9. AKI  10. Hypomagnesemia  Plan    She is fully diuresed. Creatinine now up slightly. Stop IV lasix. Will give back 500cc fluid to prepare kidneys for cath tomorrow. If renal function ok plan R/L cath tomorrow to assess coronaries and PAH. Mag being supped.   Continue vancomycin for LLE cellulitis.  AFL rate improved. Continue cardizem 180 daily. Can titrate as needed. Continue heparin for now. Eliquis after cath. Consider DC-CV in 4-6 weeks.   Length of Stay: 9 Artesha Wemhoff MD 04/17/2016, 1:54 PM Advanced Heart Failure Team Pager [phone] (M-F; [place])  Please contact [facility] for night-coverage after hours (4p -7a ) and weekends on [email or web address]

## 2016-04-19 ENCOUNTER — Encounter (HOSPITAL_COMMUNITY): Payer: Self-pay | Admitting: Internal Medicine

## 2016-04-19 DIAGNOSIS — I5033 Acute on chronic diastolic (congestive) heart failure: Secondary | ICD-10-CM

## 2016-04-19 DIAGNOSIS — J9621 Acute and chronic respiratory failure with hypoxia: Secondary | ICD-10-CM

## 2016-04-19 LAB — CBC
HEMATOCRIT: 32.4 % — AB (ref 36.0–46.0)
HEMOGLOBIN: 9.4 g/dL — AB (ref 12.0–15.0)
MCH: 24.2 pg — AB (ref 26.0–34.0)
MCHC: 29 g/dL — ABNORMAL LOW (ref 30.0–36.0)
MCV: 83.5 fL (ref 78.0–100.0)
Platelets: 259 10*3/uL (ref 150–400)
RBC: 3.88 MIL/uL (ref 3.87–5.11)
RDW: 22.2 % — ABNORMAL HIGH (ref 11.5–15.5)
WBC: 9.2 10*3/uL (ref 4.0–10.5)

## 2016-04-19 LAB — URINE MICROSCOPIC-ADD ON
RBC / HPF: NONE SEEN RBC/hpf (ref 0–5)
WBC UA: NONE SEEN WBC/hpf (ref 0–5)

## 2016-04-19 LAB — TROPONIN I
TROPONIN I: 0.06 ng/mL — AB (ref <0.03)
Troponin I: 0.07 ng/mL (ref <0.03)
Troponin I: 0.11 ng/mL (ref <0.03)

## 2016-04-19 LAB — GLUCOSE, CAPILLARY
GLUCOSE-CAPILLARY: 134 mg/dL — AB (ref 65–99)
GLUCOSE-CAPILLARY: 209 mg/dL — AB (ref 65–99)
GLUCOSE-CAPILLARY: 230 mg/dL — AB (ref 65–99)
Glucose-Capillary: 190 mg/dL — ABNORMAL HIGH (ref 65–99)
Glucose-Capillary: 255 mg/dL — ABNORMAL HIGH (ref 65–99)

## 2016-04-19 LAB — BASIC METABOLIC PANEL
ANION GAP: 8 (ref 5–15)
BUN: 13 mg/dL (ref 6–20)
CALCIUM: 9.1 mg/dL (ref 8.9–10.3)
CHLORIDE: 93 mmol/L — AB (ref 101–111)
CO2: 32 mmol/L (ref 22–32)
Creatinine, Ser: 1.04 mg/dL — ABNORMAL HIGH (ref 0.44–1.00)
GFR calc non Af Amer: 55 mL/min — ABNORMAL LOW (ref >60)
Glucose, Bld: 218 mg/dL — ABNORMAL HIGH (ref 65–99)
POTASSIUM: 4 mmol/L (ref 3.5–5.1)
Sodium: 133 mmol/L — ABNORMAL LOW (ref 135–145)

## 2016-04-19 LAB — URINALYSIS, ROUTINE W REFLEX MICROSCOPIC
Glucose, UA: 250 mg/dL — AB
KETONES UR: NEGATIVE mg/dL
LEUKOCYTES UA: NEGATIVE
NITRITE: NEGATIVE
PROTEIN: 30 mg/dL — AB
Specific Gravity, Urine: 1.04 — ABNORMAL HIGH (ref 1.005–1.030)
pH: 6.5 (ref 5.0–8.0)

## 2016-04-19 LAB — HEPARIN LEVEL (UNFRACTIONATED): HEPARIN UNFRACTIONATED: 0.35 [IU]/mL (ref 0.30–0.70)

## 2016-04-19 MED ORDER — SODIUM CHLORIDE 0.9 % IV SOLN: INTRAVENOUS | Status: DC

## 2016-04-19 MED ORDER — CLOPIDOGREL BISULFATE 75 MG PO TABS
75.0000 mg | ORAL_TABLET | Freq: Once | ORAL | Status: AC
  Administered 2016-04-19: 14:00:00 75 mg via ORAL

## 2016-04-19 MED ORDER — GABAPENTIN 300 MG PO CAPS
300.0000 mg | ORAL_CAPSULE | Freq: Once | ORAL | Status: AC
  Administered 2016-04-19: 14:00:00 300 mg via ORAL
  Filled 2016-04-19: qty 1

## 2016-04-19 MED ORDER — APIXABAN 5 MG PO TABS
5.0000 mg | ORAL_TABLET | Freq: Two times a day (BID) | ORAL | Status: DC
  Administered 2016-04-19 – 2016-04-20 (×2): 5 mg via ORAL
  Filled 2016-04-19 (×2): qty 1

## 2016-04-19 MED ORDER — HEPARIN (PORCINE) IN NACL 100-0.45 UNIT/ML-% IJ SOLN
1250.0000 [IU]/h | INTRAMUSCULAR | Status: DC
  Administered 2016-04-19: 1250 [IU]/h via INTRAVENOUS
  Filled 2016-04-19: qty 250

## 2016-04-19 NOTE — Clinical Social Work Note (Signed)
CSW continuing to follow patient's progress and patient not yet ready for discharge. Call made to Cleon Dew with [facility] to provide update.  When medically stable, CSW will facilitate discharge to [facility]   Morell Mears Givens, MSW, LCSW Licensed Clinical Social Worker [place] [phone]

## 2016-04-19 NOTE — Progress Notes (Addendum)
Advanced Heart Failure Rounding Note  Referring Physician: Dia Crawford Primary Cardiologist: Nahser Reason for Consultation: SVT  Subjective:    S/p R/LHC as below. S/p DES to LAD. 04/18/16  Overall feeling OK this morning, except for nausea. Had 1 episode of emesis after taking her morning meds on empty stomach.    Creatinine improved with IVF.  Weight down 51 lbs total. On IV abx for LE cellulitis.   Echo 04/09/16:  EF 50-55% with anteroseptal and interoseptal HK.  RV dilated and moderately HY  RVSP 68   R/LHC 04/18/16 Prox RCA to Mid RCA lesion, 30 %stenosed. Mid RCA to Dist RCA lesion, 50 %stenosed. Dist RCA lesion, 30 %stenosed. Ost LAD to Prox LAD lesion, 30 %stenosed. Mid LAD lesion, 95 %stenosed.  Hemodynamics RA =  20 RV = 65/7/22 PA = 65/28 (40) PCW = 21 Fick cardiac output/index = 4.1/2.2 PVR = 4.7 WU FA sat = 92% PA sat = 50%, 48%  Assessment: 1. Moderate PAH 2. 2-vessel CAD with high-grade mid LAD lesion  Objective:   Weight Range: 173 lb 15.1 oz (78.9 kg) Body mass index is 30.81 kg/m.   Vital Signs:   Temp:  [97.2 F (36.2 C)-98.8 F (37.1 C)] 98.4 F (36.9 C) (08/04 0800) Pulse Rate:  [0-192] 117 (08/04 0800) Resp:  [0-24] 13 (08/04 0800) BP: (90-157)/(33-89) 157/61 (08/04 0800) SpO2:  [0 %-100 %] 76 % (08/04 0839) Weight:  [173 lb 15.1 oz (78.9 kg)] 173 lb 15.1 oz (78.9 kg) (08/04 0123) Last BM Date: 04/17/16  Weight change: Filed Weights   04/17/16 0417 04/18/16 0450 04/19/16 0123  Weight: 172 lb 6.4 oz (78.2 kg) 171 lb 8.3 oz (77.8 kg) 173 lb 15.1 oz (78.9 kg)    Intake/Output:   Intake/Output Summary (Last 24 hours) at 04/19/16 1129 Last data filed at 04/19/16 1020  Gross per 24 hour  Intake          1443.75 ml  Output              950 ml  Net           493.75 ml     Physical Exam: General: In bed. NAD HEENT: normal Neck: supple. JVD flat,  Bilateral CEA scars . No thyromegaly or nodule noted.  Cor: PMI nonpalpable .  Irregular, tachy at times.  R radial cath site with good pulse and no bruit.  Lungs: Diminished basilar sounds. Clear.  Abdomen: obese soft, NT, mildly distended, no HSM. No bruits or masses. +BS  Extremities: no cyanosis, clubbing, rash,  s/p R BKA  large ecchymosis on R forearm. LLE no erythema. Neuro: alert & oriented x 3, cranial nerves grossly intact. moves all 4 extremities w/o difficulty. Affect pleasant.  Telemetry: Sinus Tach 110s  Labs: CBC  Recent Labs  04/18/16 0753 04/19/16 0201  WBC 12.5* 9.2  HGB 10.0* 9.4*  HCT 35.2* 32.4*  MCV 84.0 83.5  PLT 289 Q000111Q   Basic Metabolic Panel  Recent Labs  04/17/16 0336 04/18/16 0753 04/19/16 0201  NA 134* 132* 133*  K 4.3 4.3 4.0  CL 85* 88* 93*  CO2 32 34* 32  GLUCOSE 239* 164* 218*  BUN 13 17 13   CREATININE 1.61* 1.07* 1.04*  CALCIUM 9.8 9.4 9.1  MG 2.0  --   --    Liver Function Tests No results for input(s): AST, ALT, ALKPHOS, BILITOT, PROT, ALBUMIN in the last 72 hours. No results for input(s): LIPASE, AMYLASE in the  last 72 hours. Cardiac Enzymes  Recent Labs  04/18/16 2034 04/19/16 0201 04/19/16 0719  TROPONINI 0.05* 0.06* 0.07*    BNP: BNP (last 3 results)  Recent Labs  01/07/16 2235 03/06/16 2134 04/08/16 1531  BNP 527.0* 653.0* 920.0*    ProBNP (last 3 results) No results for input(s): PROBNP in the last 8760 hours.   D-Dimer No results for input(s): DDIMER in the last 72 hours. Hemoglobin A1C No results for input(s): HGBA1C in the last 72 hours. Fasting Lipid Panel No results for input(s): CHOL, HDL, LDLCALC, TRIG, CHOLHDL, LDLDIRECT in the last 72 hours. Thyroid Function Tests  Recent Labs  04/17/16 0336  T3FREE 3.7    Other results:     Imaging/Studies:  Dg Chest Port 1 View  Result Date: 04/18/2016 CLINICAL DATA:  Low-grade fever.  New onset cough. EXAM: PORTABLE CHEST 1 VIEW COMPARISON:  04/11/2016 FINDINGS: The cardiac silhouette is enlarged with stable preferential  enlargement of the right atrium. Mediastinal contours appear intact. There is no evidence of pneumothorax. Decreased bilateral subpulmonic pulmonary fusions. Persistent interstitial pulmonary edema. Osseous structures are without acute abnormality. Soft tissues are grossly normal. IMPRESSION: Enlarged cardiac silhouette with persistent interstitial pulmonary edema. Decreased in size subpulmonic pulmonary effusions. Electronically Signed   By: Fidela Salisbury M.D.   On: 04/18/2016 20:21     Medications:     Scheduled Medications: . antiseptic oral rinse  7 mL Mouth Rinse q12n4p  . aspirin  81 mg Oral Daily  . atorvastatin  20 mg Oral q1800  . budesonide (PULMICORT) nebulizer solution  0.5 mg Nebulization BID  . citalopram  20 mg Oral Daily  . clopidogrel  75 mg Oral Q breakfast  . clopidogrel  75 mg Oral Once  . digoxin  0.125 mg Oral Daily  . diltiazem  240 mg Oral Daily  . gabapentin  300 mg Oral BID  . gabapentin  300 mg Oral Once  . insulin aspart  0-15 Units Subcutaneous TID WC  . levalbuterol  1.25 mg Nebulization TID  . magnesium oxide  400 mg Oral BID  . pantoprazole  40 mg Oral Q1200  . sodium chloride flush  3 mL Intravenous Q12H  . vancomycin  1,250 mg Intravenous Q24H    Infusions: . heparin 1,250 Units/hr (04/19/16 0530)    PRN Medications: sodium chloride, acetaminophen, levalbuterol, ondansetron (ZOFRAN) IV, oxyCODONE, pneumococcal 13-valent conjugate vaccine, sodium chloride, sodium chloride flush   Assessment   1. Acute diastolic HF with R>>L symptoms and pulmonary HTN on echo  2. Acute on chronic respiratory failure 3. PAF/atrial flutter apparently new onset     This patients CHA2DS2-VASc Score and unadjusted Ischemic Stroke Rate (% per year) is equal to 7.2 % stroke rate/year from a score of 5 4. PAD s/p R BKA 5. Abnormal echo with anteroseptal/inferoseptal HK, RV HK and pulmonary HTN 6. Severe COPD 7. DM2 8. Cellulitis 9. AKI  10.  Hypomagnesemia  Plan    BMET much improved with gentle IVF and holding off diuretics.   Foley placed this am for urinary retention. Will leave in for this am.  Can likely remove tonight.   R/LHC yesterday with 95% LAD lesion s/p DES and hemodynamics consistent with moderate PAH.   BP elevated this morning but threw up a questionable amount of her morning meds.  States there was only 1 pill (yellow/white: Gabapentin) in the emesis basin.   Continue vancomycin for LLE cellulitis.  Continue cardizem 180 daily. Can titrate as needed. Continue  heparin this am and will transition to Eliquis this evening.   Consider DC-CV in 4-6 weeks.   Length of Stay: [place] PA-C 04/19/2016, 11:29 AM Advanced Heart Failure Team Pager [phone] (M-F; 7a - 4p)  Please contact [facility] for night-coverage after hours (4p -7a ) and weekends on [email or web address]  Patient seen and examined with Oda Kilts, PA-C. We discussed all aspects of the encounter. I agree with the assessment and plan as stated above.   Cath results reviewed with her and family. She looks good today. On cath has significant PAH and RV failure due to her lung disease. Will need to be aggressive about keeping her well oxygenated and keeping fluid off. Not candidate for pulmonary vasodilators.  AF is well rate controlled. She is s/p LAD stent yesterday.   She is stable for d/c from our standpoint:  D/c on  1) ECASA 81 daily (stop after 30 days) 2) Plavix 75mg  daily 3) Eliquis 5 bid 4) Torsemide 40 daily 5) spiro 12.5 daily 6) Atorva 20 7) Diltiazem 240 daily 8) Stop lisinopril and metoprolol for now   Will need BMET in 3 days and 7 days after d/c to watch for volume depletion  F/u in HF clinic  Denene Alamillo,MD 6:31 PM

## 2016-04-19 NOTE — Progress Notes (Addendum)
Paged D.Crosley ([phone]) and Dr. Hal Hope ([phone]) REASON: the patient is unable to void since 04/18/2016 at 0600. No call back from either rounding provider.   Bladder is distended and pt c/o pain r/t inability to urinate; also fluids ordered at 74ml/hr x 8 more days; pt has 2-3+ pitting edema RUE and LLE, also new onset CHF.  Paged cardiology- Dr. Raiford Simmonds, returned call stat- pt case discussed, fluids d/c for now, and inserted urinary foley to gravity as per physician orders.

## 2016-04-19 NOTE — Progress Notes (Addendum)
CARDIAC REHAB PHASE I   Son and friend present to help receive education. Discussed importance of meds and Plavix, daily wts (if she is able to start standing and weighing), low sodium diet. Voiced understanding but she will need more reiteration. Son sts his wife helps her the most and that her husband has had a brain injury that he can't help. Pt is mildly confused. Gave HF booklet and explained. She would benefit from video as well. Will f/u for more education as time allows. Pt is not appropriate for CRPII at this time due to her therapy with prosthesis. [place], ACSM 04/19/2016 2:21 PM

## 2016-04-19 NOTE — Care Management Note (Signed)
Case Management Note  Patient Details  Name: [name] MRN: [id number] Date of Birth: 10-30-1950  Subjective/Objective:   Patient is on ASA and Plavix, plan is for SNF,  CSW following has a bed at [facility] in [place].                 Action/Plan:   Expected Discharge Date:                  Expected Discharge Plan:  Skilled Nursing Facility  In-House Referral:  Clinical Social Work  Discharge planning Services  CM Consult  Post Acute Care Choice:    Choice offered to:     DME Arranged:    DME Agency:     HH Arranged:    [place] Agency:     Status of Service:  Completed, signed off  If discussed at [employer], dates discussed:    Additional Comments:  Zenon Mayo, RN 04/19/2016, 3:29 PM

## 2016-04-19 NOTE — Progress Notes (Signed)
PROGRESS NOTE  [name] [id number] DOB: [birth year] DOA: 04/08/2016 PCP: Maricela Curet, MD  Brief History:  65 y.o.femalewith HxAnxiety; CVA, HTN, HLD, PVD, Right BKA, COPD/Chronic Respiratory Failure on 2 L [place], and DM2 who presented to [facility] ED 7/24 with AMS, generalized edema, and SOB. Symptoms started earlier that morning and got to the point where she was cyanotic in her face and lips. She later developed nausea with 3 episodes of vomiting.  In the ED she was found to have mild hypercarbia (7.23 / 64 / 100), AoCKD, lactic acidosis, and a troponin bump. CT of the head was unremarkable and CT of the abdomen and pelvis demonstrated anasarca with mild ascites and small bilateral pleural effusions. She was also noted to hav a left foot that was cool to touch with no pulses palpable or dopplerable. She was transferred to [facility] for further evaluation.  She was treated with iv lasix, and IV heparin with good clinical response.  L/R heart cath on 8/3 showed 95% mid LAD stenosis s/p DES.  She was then started on DAPT. She developed some erythema left lower extremity which she states this has been going on intermittently, vanc on 04/16/16 started per cardiology recs  Assessment/Plan: Acute hypoxic and hypercarbic respiratory failure -due to CHF exacerbation in setting of baseline O2 dependent COPD -patient is now back on her home O2 regimen at 2L  Acute exacerbation of chronic diastolic congestive heart failure - pulmonary hypertension -Previous Baseline weight 183 pounds - diuresis continues per Cardiology  -TTE this admission no significant worsening of pulmonary hypertension -IV lasix on hold due to increased creatinine and for cath 8/3 -neg 46 pounds for the admission  ParoxysmalA fib with RVR  -CHADSVASC 5 -continue cardizem/dig -TSH elevated, FT4 1.38 -Heparin drip-->apixaban after cath -Cardiology following, plan to transition to elliquis 04/19/16  evening -04/19/16--HR up a little as pt vomited up pills this am  CAD -04/18/16 cath--showed 95% mid LAD stenosis s/p DES -continue DAPT  New low grade fever (8/3) -PCXR--no infiltrates -UA --no pyuria -WBC trending down, hemodynamically stable  L-leg cellulitis -continue vancomycin D#4 -improved -plan to d/c with doxy and cephalexin (PCN reaction = rash)  COPD/Chronic respiratory failure with hypoxia -No evidence of acute exacerbation presently -maintained on 2 L at home  Urine retention -foley place early am 8/4 -d/c foley for voiding trial  Probable OSA / OHS -CPAP at night  Pulmonary nodules bilateral upper lobes -Noted on CT scan February 2017 with recommendation for follow-up in 6-12 months  Right pleural effusion -Secondary to fluid overload - not clinically significant at present -stable on home 2 L  Cirrhosis of the liver -Noted on CT scan chest February 2017  -small esophageal varices also appreciated at that time  -due to chronic hepatic congestion related to pulmonary hypertension/CHF - interestingly not noted on CT abdomen this admission  -Viral Hepatitis panel unremarkable  Cold LLE -seen by Vascular Surgery - no emergent surgery needed  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 + ACEi + NSAID's,+vomiting+ hemodynamic changes Baseline creatinine 0.9-1.1- -am BMP  Diabetes mellitus, type II  -A1c 10/27/2015 8.6 -Continue insulin sliding scale and CBG monitoring -continue lantus 5 units q hs -pt did not get her am 8/4 ISS coverage  Normocytic anemia -Likely related to chronic kidney disease  -no evidence of acute blood loss  -Anemia panel shows adequate iron sat 15%, ferritin 106 -Continue to monitor CBC  Anxiety -Stable  History of stroke  Hypomagnesemia -repleted -stable   DVT ProphylaxisIV Heparin  Code Status:Full  Family Communication:Husband at bedside  Disposition Plan:SNF  when cleared by cardiology  Consultants:  Cardiology  Procedures: As Listed in Progress Note Above  Antibiotics: Vancomycin 04/16/16>>>    Subjective: Pt had one episode of N/V this am-->improved.  Had urine retention-->foley placed.  Patient denies fevers, chills, headache, chest pain, dyspnea, nausea, vomiting, diarrhea, abdominal pain, dysuria, hematuria, hematochezia, and melena.   Objective: Vitals:   04/19/16 0200 04/19/16 0800 04/19/16 0839 04/19/16 1200  BP: (!) 126/53 (!) 157/61  (!) 154/64  Pulse:  (!) 117  (!) 113  Resp: 14 13  14   Temp:  98.4 F (36.9 C)  98.9 F (37.2 C)  TempSrc:  Oral  Oral  SpO2:  (!) 55% (!) 76% 94%  Weight:      Height:        Intake/Output Summary (Last 24 hours) at 04/19/16 1245 Last data filed at 04/19/16 1227  Gross per 24 hour  Intake          1803.75 ml  Output              950 ml  Net           853.75 ml   Weight change: 1.1 kg (2 lb 6.8 oz) Exam:   General:  Pt is alert, follows commands appropriately, not in acute distress  HEENT: No icterus, No thrush, No neck mass, [place]/AT  Cardiovascular: RRR, S1/S2, no rubs, no gallops  Respiratory: fine bibasilar crackles, no wheeze  Abdomen: Soft/+BS, non tender, non distended, no guarding  Extremities: trace LLE edema, No lymphangitis, No petechiae, No rashes, no synovitis   Data Reviewed: I have personally reviewed following labs and imaging studies Basic Metabolic Panel:  Recent Labs Lab 04/13/16 0352 04/14/16 0349 04/15/16 0609 04/16/16 0416 04/17/16 0336 04/18/16 0753 04/19/16 0201  NA 138 140 137 138 134* 132* 133*  K 4.2 4.0 3.8 3.8 4.3 4.3 4.0  CL 92* 90* 90* 89* 85* 88* 93*  CO2 38* 42* 41* 40* 32 34* 32  GLUCOSE 79 123* 137* 149* 239* 164* 218*  BUN 13 15 13 12 13 17 13   CREATININE 1.12* 1.14* 1.10* 1.04* 1.61* 1.07* 1.04*  CALCIUM 9.3 9.6 9.5 9.6 9.8 9.4 9.1  MG 1.8 1.5* 1.4* 1.7 2.0  --   --    Liver Function Tests:  Recent Labs Lab  04/16/16 0416  AST 20  ALT 13*  ALKPHOS 51  BILITOT 0.9  PROT 6.2*  ALBUMIN 3.3*   No results for input(s): LIPASE, AMYLASE in the last 168 hours. No results for input(s): AMMONIA in the last 168 hours. Coagulation Profile:  Recent Labs Lab 04/17/16 1813  INR 1.15   CBC:  Recent Labs Lab 04/13/16 0352 04/14/16 0349 04/15/16 0609 04/16/16 0416 04/18/16 0753 04/19/16 0201  WBC 4.8 6.9 6.6 8.4 12.5* 9.2  NEUTROABS 2.6 3.6 3.7  --   --   --   HGB 9.2* 9.3* 9.0* 9.2* 10.0* 9.4*  HCT 32.4* 33.5* 32.0* 32.1* 35.2* 32.4*  MCV 82.2 83.3 83.8 83.2 84.0 83.5  PLT 177 201 200 229 289 259   Cardiac Enzymes:  Recent Labs Lab 04/18/16 2034 04/19/16 0201 04/19/16 0719  TROPONINI 0.05* 0.06* 0.07*   BNP: Invalid input(s): POCBNP CBG:  Recent Labs Lab 04/18/16 1604 04/18/16 1855 04/18/16 2109 04/19/16 0620 04/19/16 1223  GLUCAP 155* 134* 142* 190* 209*  HbA1C: No results for input(s): HGBA1C in the last 72 hours. Urine analysis:    Component Value Date/Time   COLORURINE YELLOW 04/19/2016 0140   APPEARANCEUR CLOUDY (A) 04/19/2016 0140   LABSPEC 1.040 (H) 04/19/2016 0140   PHURINE 6.5 04/19/2016 0140   GLUCOSEU 250 (A) 04/19/2016 0140   HGBUR TRACE (A) 04/19/2016 0140   BILIRUBINUR SMALL (A) 04/19/2016 0140   KETONESUR NEGATIVE 04/19/2016 0140   PROTEINUR 30 (A) 04/19/2016 0140   UROBILINOGEN 1.0 04/12/2015 0915   NITRITE NEGATIVE 04/19/2016 0140   LEUKOCYTESUR NEGATIVE 04/19/2016 0140   Sepsis Labs: @LABRCNTIP (procalcitonin:4,lacticidven:4) )No results found for this or any previous visit (from the past 240 hour(s)).   Scheduled Meds: . antiseptic oral rinse  7 mL Mouth Rinse q12n4p  . aspirin  81 mg Oral Daily  . atorvastatin  20 mg Oral q1800  . budesonide (PULMICORT) nebulizer solution  0.5 mg Nebulization BID  . citalopram  20 mg Oral Daily  . clopidogrel  75 mg Oral Q breakfast  . clopidogrel  75 mg Oral Once  . digoxin  0.125 mg Oral Daily   . diltiazem  240 mg Oral Daily  . gabapentin  300 mg Oral BID  . gabapentin  300 mg Oral Once  . insulin aspart  0-15 Units Subcutaneous TID WC  . levalbuterol  1.25 mg Nebulization TID  . magnesium oxide  400 mg Oral BID  . pantoprazole  40 mg Oral Q1200  . sodium chloride flush  3 mL Intravenous Q12H  . vancomycin  1,250 mg Intravenous Q24H   Continuous Infusions: . heparin 1,250 Units/hr (04/19/16 0530)    Procedures/Studies: Ct Abdomen Pelvis Wo Contrast  Result Date: 04/08/2016 CLINICAL DATA:  Altered mental status with generalized body edema and facial cyanosis. EXAM: CT ABDOMEN AND PELVIS WITHOUT CONTRAST TECHNIQUE: Multidetector CT imaging of the abdomen and pelvis was performed following the standard protocol without IV contrast. COMPARISON:  CT 12/20/2012.  Ultrasound 12/26/2013. FINDINGS: Lower chest: There are moderate size dependent pleural effusions, larger on the right. No significant pericardial effusion. There is atelectasis at both lung bases, worst in the right lower lobe. Hepatobiliary: The liver appears unremarkable as imaged in the noncontrast state. Previous cholecystectomy without evidence of significant biliary dilatation. Pancreas: The pancreas appears mildly atrophy, but unchanged. No focal surrounding inflammation. Spleen: Normal in size without focal abnormality. There is a stable small splenule. Adrenals/Urinary Tract: Both adrenal glands appear normal. Both kidneys appear unremarkable as imaged in the noncontrast state. No evidence of urinary tract calculus or hydronephrosis. The bladder is partially collapsed, likely accounting for mild prominence of its wall. Stomach/Bowel: No evidence of bowel wall thickening, distention or surrounding inflammatory change. Retrocecal surgical clips consistent with previous appendectomy. Vascular/Lymphatic: There are no enlarged abdominal or pelvic lymph nodes. Small retroperitoneal lymph nodes are not significantly enlarged.  Stable aortic and branch vessel atherosclerosis. Reproductive: The uterus and ovaries appear stable. No evidence of adnexal mass. Other: Generalized anasarca with extensive subcutaneous edema. There is lesser mesenteric and retroperitoneal edema. A small amount of ascites is present. Musculoskeletal: No acute or significant osseous findings. Postsurgical changes status post lower lumbar fusion. There are injection granulomas in the buttocks bilaterally. IMPRESSION: 1. Anasarca with generalized soft tissue edema and moderate size bilateral pleural effusions. Mild ascites. 2. No acute intra-abdominal findings identified. 3. Aortic atherosclerosis and postsurgical changes as described. Electronically Signed   By: Richardean Sale M.D.   On: 04/08/2016 18:00  Ct Head Wo Contrast  Result Date:  04/08/2016 CLINICAL DATA:  Altered mental status and cyanosis EXAM: CT HEAD WITHOUT CONTRAST TECHNIQUE: Contiguous axial images were obtained from the base of the skull through the vertex without intravenous contrast. COMPARISON:  04/11/2008 FINDINGS: Bony calvarium is intact. No findings to suggest acute hemorrhage, acute infarction or space-occupying mass lesion are noted. IMPRESSION: No acute abnormality noted. Electronically Signed   By: Inez Catalina M.D.   On: 04/08/2016 17:53  Dg Chest Port 1 View  Result Date: 04/18/2016 CLINICAL DATA:  Low-grade fever.  New onset cough. EXAM: PORTABLE CHEST 1 VIEW COMPARISON:  04/11/2016 FINDINGS: The cardiac silhouette is enlarged with stable preferential enlargement of the right atrium. Mediastinal contours appear intact. There is no evidence of pneumothorax. Decreased bilateral subpulmonic pulmonary fusions. Persistent interstitial pulmonary edema. Osseous structures are without acute abnormality. Soft tissues are grossly normal. IMPRESSION: Enlarged cardiac silhouette with persistent interstitial pulmonary edema. Decreased in size subpulmonic pulmonary effusions. Electronically  Signed   By: Fidela Salisbury M.D.   On: 04/18/2016 20:21   Dg Chest Port 1 View  Result Date: 04/13/2016 CLINICAL DATA:  Pulmonary edema EXAM: PORTABLE CHEST 1 VIEW COMPARISON:  04/11/2016 FINDINGS: Cardiomegaly with vascular congestion and interstitial prominence, likely interstitial edema. Small right pleural effusion with right base atelectasis or infiltrate. IMPRESSION: Suspect mild interstitial edema/CHF. Persistent right basilar atelectasis or infiltrate with right effusion. Electronically Signed   By: Rolm Baptise M.D.   On: 04/13/2016 07:49  Dg Chest Port 1 View  Result Date: 04/11/2016 CLINICAL DATA:  Decreasing oxygen saturation, COPD, right pleural effusion, CHF EXAM: PORTABLE CHEST 1 VIEW COMPARISON:  Portable chest x-ray of April 09, 2016 FINDINGS: The patient is positioned in a lordotic fashion. The lungs are mildly hyperinflated. Persistent increased density at the right lung base is compatible with a posterior layering pleural effusion. There is no pneumothorax. The cardiac silhouette is enlarged. The pulmonary vascularity is less prominent today. There may be a small left pleural effusion laterally. The observed bony thorax is unremarkable. IMPRESSION: Interval improvement in pulmonary interstitial edema. Persistent moderate-sized right-sided pleural effusion layering posteriorly. A small left pleural effusion is likely present. Stable cardiomegaly. Electronically Signed   By: Kemarion Abbey  [place] M.D.   On: 04/11/2016 07:55  Dg Chest Port 1 View  Result Date: 04/09/2016 CLINICAL DATA:  Respiratory failure, history of COPD, CHF, diabetes EXAM: PORTABLE CHEST 1 VIEW COMPARISON:  Portable chest x-ray of April 08, 2016 FINDINGS: The lungs are reasonably well inflated. There is increased density in the right mid and lower lung worrisome for interstitial edema. Bilateral pleural effusions layering posteriorly are suspected. The cardiac silhouette is enlarged. The pulmonary vascularity is mildly  engorged. There is no pneumothorax. IMPRESSION: CHF with slight increased prominence of interstitial edema today especially on the right. Right basilar atelectasis or pneumonia. Probable smaller moderate-sized bilateral pleural effusions layering posteriorly. Electronically Signed   By: Marquese Burkland  [place] M.D.   On: 04/09/2016 07:33  Dg Chest Portable 1 View  Result Date: 04/08/2016 CLINICAL DATA:  Altered mental status with generalized body edema. Periorbital swelling with facial cyanosis. EXAM: PORTABLE CHEST 1 VIEW COMPARISON:  03/06/2016 and 01/07/2016. FINDINGS: 1545 hours. Overall lower lung volumes. The heart is enlarged. There is chronic vascular congestion with lesser aeration of the lung bases. A small amount of pleural fluid remains likely. There is no pneumothorax or overt pulmonary edema. The bones appear unchanged status post distal right clavicle resection. Surgical clips are present in the lower neck. IMPRESSION: Cardiomegaly with chronic vascular congestion. There  is lesser aeration of the lung bases with possible bilateral pleural effusions. Electronically Signed   By: Richardean Sale M.D.   On: 04/08/2016 16:02   Azriella Mattia, DO  [facility] Pager [phone]  If 7PM-7AM, please contact night-coverage [email or web address] Password [email or web address] 04/19/2016, 12:45 PM   LOS: 11 days

## 2016-04-19 NOTE — Progress Notes (Signed)
ANTICOAGULATION CONSULT NOTE - Follow Up Consult  Pharmacy Consult for Heparin > Eliquis Indication: atrial fibrillation  Patient Measurements: Height: 5\' 3"  (160 cm) Weight: 173 lb 15.1 oz (78.9 kg) IBW/kg (Calculated) : 52.4 Heparin Dosing Weight: 74 kg  Vital Signs: Temp: 98.9 F (37.2 C) (08/04 1200) Temp Source: Oral (08/04 1200) BP: 154/64 (08/04 1200) Pulse Rate: 113 (08/04 1200)  Labs:  Recent Labs  04/17/16 0336 04/17/16 1813 04/18/16 0753 04/18/16 2034 04/19/16 0201 04/19/16 0719  HGB  --   --  10.0*  --  9.4*  --   HCT  --   --  35.2*  --  32.4*  --   PLT  --   --  289  --  259  --   LABPROT  --  14.8  --   --   --   --   INR  --  1.15  --   --   --   --   HEPARINUNFRC 0.45  --  0.34  --   --   --   CREATININE 1.61*  --  1.07*  --  1.04*  --   TROPONINI  --   --   --  0.05* 0.06* 0.07*    Estimated Creatinine Clearance: 53.6 mL/min (by C-G formula based on SCr of 1.04 mg/dL).  Medications: . heparin 1,250 Units/hr (04/19/16 0530)    Assessment: 48 yof continues on heparin for afib. S/p cath with PCI yesterday.  Heparin resumed last night, heparin level pending for 2 PM.  Discussed with Dr. Haroldine Laws, will transition to Eliquis tonight at bedtime.    Goal of Therapy:  Heparin level 0.3-0.7 units/ml Monitor platelets by anticoagulation protocol: Yes   Plan:  1) F/u heparin level at 1400 PM. 2) D/c heparin at 10 PM, and give Eliquis 5 mg at this same time. 3) Monitor for bleeding.  Uvaldo Rising, BCPS  Clinical Pharmacist Pager [phone]  04/19/2016 3:09 PM

## 2016-04-19 NOTE — Progress Notes (Signed)
Site area: right brachial vein    Site Prior to Removal:  Level 0  Pressure Applied For 15 MINUTES    Minutes Beginning at 2045  Manual:   Yes.    Patient Status During Pull:  Stable; follows directions   Post Pull Site:  Level 0  Post Pull Instructions Given:  Yes.    Post Pull Pulses Present:  Yes.    Dressing Applied:  Yes.     Comments:  Sterile 4x4 and transparent.  TR BAND REMOVAL  LOCATION:    right radial  DEFLATED PER PROTOCOL:    Yes.    TIME BAND OFF / DRESSING APPLIED:    2245    SITE UPON ARRIVAL:    Level 0   SITE AFTER BAND REMOVAL:    Level 0  CIRCULATION SENSATION AND MOVEMENT:    Within Normal Limits   Yes.    COMMENTS:   Applied sterile 2x2, and transparent dressing. Follows TR/site teaching and verbalizes understanding

## 2016-04-19 NOTE — Progress Notes (Signed)
ANTICOAGULATION CONSULT NOTE - Follow Up Consult  Pharmacy Consult for Heparin > Eliquis Indication: atrial fibrillation  Patient Measurements: Height: 5\' 3"  (160 cm) Weight: 173 lb 15.1 oz (78.9 kg) IBW/kg (Calculated) : 52.4 Heparin Dosing Weight: 74 kg  Vital Signs: Temp: 98.9 F (37.2 C) (08/04 1200) Temp Source: Oral (08/04 1200) BP: 154/64 (08/04 1200) Pulse Rate: 113 (08/04 1200)  Labs:  Recent Labs  04/17/16 0336 04/17/16 1813 04/18/16 0753  04/19/16 0201 04/19/16 0719 04/19/16 1420  HGB  --   --  10.0*  --  9.4*  --   --   HCT  --   --  35.2*  --  32.4*  --   --   PLT  --   --  289  --  259  --   --   LABPROT  --  14.8  --   --   --   --   --   INR  --  1.15  --   --   --   --   --   HEPARINUNFRC 0.45  --  0.34  --   --   --  0.35  CREATININE 1.61*  --  1.07*  --  1.04*  --   --   TROPONINI  --   --   --   < > 0.06* 0.07* 0.11*  < > = values in this interval not displayed.  Estimated Creatinine Clearance: 53.6 mL/min (by C-G formula based on SCr of 1.04 mg/dL).  Medications: . heparin 1,250 Units/hr (04/19/16 0530)    Assessment: 21 yof continues on heparin for afib. S/p cath with PCI yesterday.  Heparin resumed last night, heparin level at goal this afternoon.  Discussed with Dr. Haroldine Laws, will transition to Eliquis tonight at bedtime.    Goal of Therapy:  Heparin level 0.3-0.7 units/ml Monitor platelets by anticoagulation protocol: Yes   Plan:  1)No changes to heparin 2) D/c heparin at 10 PM, and give Eliquis 5 mg at this same time. 3) Monitor for bleeding.  Erin Hearing PharmD., BCPS Clinical Pharmacist Pager [phone] 04/19/2016 4:17 PM

## 2016-04-19 NOTE — Progress Notes (Signed)
Pt with transfer order to 3E16. Report given to [facility]. V/S stable. No c/o any chest pain/ discomfort. Family made aware. Transferred @ 22:25.

## 2016-04-19 NOTE — Progress Notes (Signed)
Unable to give patient vancomycin at 1200. No IV access and unable to obtain IV access for infusion. IV site available infusing heparin infusion and not compatible with vancomycin. Called pharmacist Tillie Rung and notified that vancomycin infusion was given at 1900. Heparin infusion discontinued at 1830 so IV access available to infuse vanco.

## 2016-04-20 DIAGNOSIS — Z794 Long term (current) use of insulin: Secondary | ICD-10-CM

## 2016-04-20 DIAGNOSIS — J90 Pleural effusion, not elsewhere classified: Secondary | ICD-10-CM

## 2016-04-20 DIAGNOSIS — J431 Panlobular emphysema: Secondary | ICD-10-CM

## 2016-04-20 DIAGNOSIS — E662 Morbid (severe) obesity with alveolar hypoventilation: Secondary | ICD-10-CM

## 2016-04-20 DIAGNOSIS — E1165 Type 2 diabetes mellitus with hyperglycemia: Secondary | ICD-10-CM

## 2016-04-20 DIAGNOSIS — E118 Type 2 diabetes mellitus with unspecified complications: Secondary | ICD-10-CM

## 2016-04-20 DIAGNOSIS — J9601 Acute respiratory failure with hypoxia: Secondary | ICD-10-CM

## 2016-04-20 LAB — CBC
HEMATOCRIT: 34.3 % — AB (ref 36.0–46.0)
HEMOGLOBIN: 9.7 g/dL — AB (ref 12.0–15.0)
MCH: 24 pg — ABNORMAL LOW (ref 26.0–34.0)
MCHC: 28.3 g/dL — ABNORMAL LOW (ref 30.0–36.0)
MCV: 84.7 fL (ref 78.0–100.0)
Platelets: 318 10*3/uL (ref 150–400)
RBC: 4.05 MIL/uL (ref 3.87–5.11)
RDW: 22.8 % — ABNORMAL HIGH (ref 11.5–15.5)
WBC: 11 10*3/uL — AB (ref 4.0–10.5)

## 2016-04-20 LAB — BASIC METABOLIC PANEL
ANION GAP: 12 (ref 5–15)
BUN: 12 mg/dL (ref 6–20)
CO2: 31 mmol/L (ref 22–32)
Calcium: 9.8 mg/dL (ref 8.9–10.3)
Chloride: 93 mmol/L — ABNORMAL LOW (ref 101–111)
Creatinine, Ser: 0.91 mg/dL (ref 0.44–1.00)
GFR calc non Af Amer: >60 mL/min (ref >60)
GLUCOSE: 205 mg/dL — AB (ref 65–99)
POTASSIUM: 4.4 mmol/L (ref 3.5–5.1)
Sodium: 136 mmol/L (ref 135–145)

## 2016-04-20 LAB — URINE CULTURE: Culture: NO GROWTH

## 2016-04-20 LAB — MAGNESIUM: Magnesium: 1.8 mg/dL (ref 1.7–2.4)

## 2016-04-20 LAB — GLUCOSE, CAPILLARY
GLUCOSE-CAPILLARY: 208 mg/dL — AB (ref 65–99)
GLUCOSE-CAPILLARY: 225 mg/dL — AB (ref 65–99)

## 2016-04-20 MED ORDER — DOXYCYCLINE HYCLATE 100 MG PO TABS
100.0000 mg | ORAL_TABLET | Freq: Two times a day (BID) | ORAL | Status: DC
  Administered 2016-04-20: 100 mg via ORAL
  Filled 2016-04-20: qty 1

## 2016-04-20 MED ORDER — CEPHALEXIN 500 MG PO CAPS
500.0000 mg | ORAL_CAPSULE | Freq: Three times a day (TID) | ORAL | Status: DC
  Administered 2016-04-20: 500 mg via ORAL
  Filled 2016-04-20: qty 1

## 2016-04-20 MED ORDER — OXYCODONE-ACETAMINOPHEN 10-325 MG PO TABS: 1.0000 | ORAL_TABLET | Freq: Four times a day (QID) | ORAL | 0 refills | Status: DC | PRN

## 2016-04-20 MED ORDER — DILTIAZEM HCL ER COATED BEADS 240 MG PO CP24: 240.0000 mg | ORAL_CAPSULE | Freq: Every day | ORAL | 1 refills | Status: AC

## 2016-04-20 MED ORDER — CLOPIDOGREL BISULFATE 75 MG PO TABS: 75.0000 mg | ORAL_TABLET | Freq: Every day | ORAL | 1 refills | Status: DC

## 2016-04-20 MED ORDER — CEPHALEXIN 500 MG PO CAPS
500.0000 mg | ORAL_CAPSULE | Freq: Three times a day (TID) | ORAL | 0 refills | Status: DC
Start: 2016-04-20 — End: 2016-05-16

## 2016-04-20 MED ORDER — APIXABAN 5 MG PO TABS: 5.0000 mg | ORAL_TABLET | Freq: Two times a day (BID) | ORAL | 0 refills | Status: DC

## 2016-04-20 MED ORDER — DOXYCYCLINE HYCLATE 100 MG PO TABS: 100.0000 mg | ORAL_TABLET | Freq: Two times a day (BID) | ORAL | 0 refills | Status: DC

## 2016-04-20 MED ORDER — AMIODARONE HCL 200 MG PO TABS
200.0000 mg | ORAL_TABLET | Freq: Every day | ORAL | Status: DC
  Administered 2016-04-20: 200 mg via ORAL
  Filled 2016-04-20: qty 1

## 2016-04-20 MED ORDER — SPIRONOLACTONE 25 MG PO TABS: 12.5000 mg | ORAL_TABLET | Freq: Every day | ORAL | 0 refills | Status: DC

## 2016-04-20 MED ORDER — LEVALBUTEROL HCL 1.25 MG/0.5ML IN NEBU: 1.2500 mg | INHALATION_SOLUTION | Freq: Two times a day (BID) | RESPIRATORY_TRACT | Status: DC

## 2016-04-20 MED ORDER — TORSEMIDE 20 MG PO TABS: 40.0000 mg | ORAL_TABLET | Freq: Every day | ORAL | 0 refills | Status: DC

## 2016-04-20 MED ORDER — AMIODARONE HCL 200 MG PO TABS: 200.0000 mg | ORAL_TABLET | Freq: Every day | ORAL | 1 refills | Status: DC

## 2016-04-20 MED ORDER — ASPIRIN 81 MG PO CHEW: 81.0000 mg | CHEWABLE_TABLET | Freq: Every day | ORAL | 0 refills | Status: DC

## 2016-04-20 MED ORDER — ALPRAZOLAM 0.25 MG PO TABS: 0.2500 mg | ORAL_TABLET | Freq: Two times a day (BID) | ORAL | 0 refills | Status: AC

## 2016-04-20 MED ORDER — INSULIN GLARGINE 100 UNIT/ML SOLOSTAR PEN: 15.0000 [IU] | PEN_INJECTOR | Freq: Every day | SUBCUTANEOUS | 0 refills | Status: DC

## 2016-04-20 NOTE — Clinical Social Work Placement (Signed)
   CLINICAL SOCIAL WORK PLACEMENT  NOTE  Date:  04/20/2016  Patient Details  Name: [name] MRN: [id number] Date of Birth: April 23, 1951  Clinical Social Work is seeking post-discharge placement for this patient at the [place] level of care (*CSW will initial, date and re-position this form in  chart as items are completed):  Yes   Patient/family provided with [facility] Work Department's list of facilities offering this level of care within the geographic area requested by the patient (or if unable, by the patient's family).  Yes   Patient/family informed of their freedom to choose among providers that offer the needed level of care, that participate in Medicare, Medicaid or managed care program needed by the patient, have an available bed and are willing to accept the patient.  Yes   Patient/family informed of [place]'s ownership interest in [facility] and [facility], as well as of the fact that they are under no obligation to receive care at these facilities.  PASRR submitted to EDS on 04/12/16     PASRR number received on       Existing PASRR number confirmed on 04/12/16     FL2 transmitted to all facilities in geographic area requested by pt/family on 04/12/16     FL2 transmitted to all facilities within larger geographic area on       Patient informed that his/her managed care company has contracts with or will negotiate with certain facilities, including the following:        Yes   Patient/family informed of bed offers received.  Patient chooses bed at [facility]     Physician recommends and patient chooses bed at      Patient to be transferred to [facility] on 04/20/16.  Patient to be transferred to facility by [facility]     Patient family notified on 04/20/16 of transfer.  Name of family member notified:  Constance Holster and [name] (Patient called them while CSW in room)     PHYSICIAN Please prepare prescriptions      Additional Comment:    _______________________________________________ Candie Chroman, LCSW 04/20/2016, 12:59 PM

## 2016-04-20 NOTE — Progress Notes (Signed)
Pt d/c'd to [facility] via [place] in no distress. sats 95% on 2 l [place]. Report called to [place] at [facility].

## 2016-04-20 NOTE — Discharge Summary (Signed)
Physician Discharge Summary  [name] [id number] DOB: Jan 22, 1951 DOA: 04/08/2016  PCP: Maricela Curet, MD  Admit date: 04/08/2016 Discharge date: 04/20/2016  Admitted From: Home Disposition:  SNF  Recommendations for Outpatient Follow-up:  1. Follow up with PCP in 1-2 weeks 2. Please obtain BMP/CBC in one week 3. Maintain patient on 2 L nasal cannula   Equipment/Devices:2L Mack  Discharge Condition:stable CODE STATUS:FULL Diet recommendation: Heart Healthy / Carb Modified    Brief/Interim Summary: 65 y.o.femalewith HxAnxiety; CVA, HTN, HLD, PVD, Right BKA, COPD/Chronic Respiratory Failure on 2 L , and DM2 who presented to [facility] ED 7/24 with AMS, generalized edema, and SOB. Symptoms started earlier that morning and got to the point where she was cyanotic in her face and lips. She later developed nausea with 3 episodes of vomiting.  In the ED she was found to have mild hypercarbia (7.23 / 64 / 100), AoCKD, lactic acidosis, and a troponin bump. CT of the head was unremarkable and CT of the abdomen and pelvis demonstrated anasarca with mild ascites and small bilateral pleural effusions. She was also noted to hav a left foot that was cool to touch with no pulses palpable or dopplerable. She was transferred to [facility] for further evaluation.  She was treated with iv lasix, and IV heparin with good clinical response.  L/R heart cath on 8/3 showed 95% mid LAD stenosis s/p DES.  She was then started on DAPT. She developed some erythema left lower extremity which she states this has been going on intermittently, vanc on 04/16/16 started per cardiology recs  Discharge Diagnoses:  Acute hypoxic and hypercarbic respiratory failure -due to CHF exacerbation in setting of baseline O2 dependent COPD -patient is now back on her home O2 regimen at 2L  Acute exacerbation of chronic diastolic congestive heart failure - pulmonary hypertension -Previous Baseline weight 183 pounds -  diuresis continues per Cardiology  -TTE this admission no significant worsening of pulmonary hypertension -IV lasix on hold due to increased creatinine and for cath 8/3 -neg 46 pounds for the admission -d/c with torsemide 40 mg daily with aldactone per cardiology  ParoxysmalA fib with RVR  -CHADSVASC 5 -continue cardizem cd -TSH elevated, FT4 1.38 -Heparin drip-->apixaban after cath -Cardiology following, plan to transition to elliquis 04/19/16 evening -d/c with amiodarone 200 mg daily per cardiology  CAD -04/18/16 cath--showed 95% mid LAD stenosis s/p DES -continue DAPT--ASA for 30 days only per cardiology  New low grade fever (8/3) -PCXR--no infiltrates -UA --no pyuria -WBC trending down, hemodynamically stable  L-leg cellulitis -continue vancomycin D#4 -improved -plan to d/c with doxy and cephalexin (PCN reaction = rash) x 3 more days to finish 7 days of tx  COPD/Chronic respiratory failure with hypoxia -No evidence of acute exacerbation presently -maintained on 2 L at home  Urine retention -foley place early am 8/4 -d/c foley for voiding trial-->able to urinate spontaneously  Probable OSA / OHS -CPAP at night during hospitalization -will need outpt sleep study  Pulmonary nodules bilateral upper lobes -Noted on CT scan February 2017 with recommendation for follow-up in 6-12 months  Right pleural effusion -Secondary to fluid overload - not clinically significant at present -stable on home 2 L  Cirrhosis of the liver -Noted on CT scan chest February 2017  -small esophageal varices also appreciated at that time  -due to chronic hepatic congestion related to pulmonary hypertension/CHF - interestingly not noted on CT abdomen this admission  -Viral Hepatitis panel unremarkable  Cold LLE -seen by Vascular Surgery -  no emergent surgery needed  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   Pt did not receive any insulin glargine during hospitalization.  Please check CBGs q ac and hs and adjust as necessary  Normocytic anemia -Likely related to chronic kidney disease  -no evidence of acute blood loss  -Anemia panel shows adequate iron sat 15%, ferritin 106 -Continue to monitor CBC  Anxiety -Stable  History of stroke  Hypomagnesemia -repleted -stable   Discharge Instructions  Discharge Instructions    Diet - low sodium heart healthy    Complete by:  As directed   Increase activity slowly    Complete by:  As directed       Medication List    STOP taking these medications   aspirin 325 MG tablet Replaced by:  aspirin 81 MG chewable tablet   furosemide 20 MG tablet Commonly known as:  LASIX   lisinopril 20 MG tablet Commonly known as:  PRINIVIL,ZESTRIL   metoprolol tartrate 25 MG tablet Commonly known as:  LOPRESSOR   naproxen 500 MG tablet Commonly known as:  NAPROSYN   predniSONE 20 MG tablet Commonly known as:  DELTASONE     TAKE these medications   albuterol 108 (90 Base) MCG/ACT inhaler Commonly known as:  PROVENTIL HFA;VENTOLIN HFA Inhale 2 puffs into the lungs every 6 (six) hours as needed for wheezing or shortness of breath.   ALPRAZolam 0.25 MG tablet Commonly known as:  XANAX Take 1 tablet (0.25 mg total) by mouth 2 (two) times daily.   amiodarone 200 MG tablet Commonly known as:  PACERONE Take 1 tablet (200 mg total) by mouth daily.   apixaban 5 MG Tabs tablet Commonly known as:  ELIQUIS Take 1 tablet (5 mg total) by mouth 2 (two) times daily.   aspirin 81 MG chewable tablet Chew 1 tablet (81 mg total) by mouth daily. X 30 days only Replaces:  aspirin 325 MG tablet   atorvastatin 20 MG tablet Commonly  known as:  LIPITOR Take 1 tablet (20 mg total) by mouth daily at 6 PM.   cephALEXin 500 MG capsule Commonly known as:  KEFLEX Take 1 capsule (500 mg total) by mouth every 8 (eight) hours.   citalopram 20 MG tablet Commonly known as:  CELEXA Take 20 mg by mouth daily.   clopidogrel 75 MG tablet Commonly known as:  PLAVIX Take 1 tablet (75 mg total) by mouth daily with breakfast.   diltiazem 240 MG 24 hr capsule Commonly known as:  CARDIZEM CD Take 1 capsule (240 mg total) by mouth daily.   doxycycline 100 MG tablet Commonly known as:  VIBRA-TABS Take 1 tablet (100 mg total) by mouth every 12 (twelve) hours.   fluticasone furoate-vilanterol 100-25 MCG/INH Aepb Commonly known as:  BREO ELLIPTA Inhale 1 puff into the lungs daily.   gabapentin 300 MG capsule Commonly known as:  NEURONTIN Take 300 mg by mouth 2 (two) times daily.   insulin aspart 100 UNIT/ML injection Commonly known as:  novoLOG Inject 10 Units into the skin 3 (three) times daily before meals.   Insulin Glargine 100 UNIT/ML Solostar Pen Commonly known as:  LANTUS SOLOSTAR Inject 15 Units into the skin at bedtime. What changed:  how much to  take   ipratropium-albuterol 0.5-2.5 (3) MG/3ML Soln Commonly known as:  DUONEB Take 3 mLs by nebulization every 6 (six) hours as needed (wheezing).   metFORMIN 500 MG tablet Commonly known as:  GLUCOPHAGE Take 500 mg by mouth 2 (two) times daily with a meal.   oxyCODONE-acetaminophen 10-325 MG tablet Commonly known as:  PERCOCET Take 1 tablet by mouth 4 (four) times daily as needed for pain.   pantoprazole 40 MG tablet Commonly known as:  PROTONIX Take 40 mg by mouth daily.   spironolactone 25 MG tablet Commonly known as:  ALDACTONE Take 0.5 tablets (12.5 mg total) by mouth daily.   torsemide 20 MG tablet Commonly known as:  DEMADEX Take 2 tablets (40 mg total) by mouth daily.       Allergies  Allergen Reactions  . Codeine Rash  . Penicillins Other  (See Comments)    Has patient had a PCN reaction causing immediate rash, facial/tongue/throat swelling, SOB or lightheadedness with hypotension: unknown Has patient had a PCN reaction causing severe rash involving mucus membranes or skin necrosis: unknown Has patient had a PCN reaction that required hospitalization unknown Has patient had a PCN reaction occurring within the last 10 years: unknown If all of the above answers are "NO", then may proceed with Cephalosporin use.  . Ciprofloxacin Hives    Consultations:  cardiology   Procedures/Studies: Ct Abdomen Pelvis Wo Contrast  Result Date: 04/08/2016 CLINICAL DATA:  Altered mental status with generalized body edema and facial cyanosis. EXAM: CT ABDOMEN AND PELVIS WITHOUT CONTRAST TECHNIQUE: Multidetector CT imaging of the abdomen and pelvis was performed following the standard protocol without IV contrast. COMPARISON:  CT 12/20/2012.  Ultrasound 12/26/2013. FINDINGS: Lower chest: There are moderate size dependent pleural effusions, larger on the right. No significant pericardial effusion. There is atelectasis at both lung bases, worst in the right lower lobe. Hepatobiliary: The liver appears unremarkable as imaged in the noncontrast state. Previous cholecystectomy without evidence of significant biliary dilatation. Pancreas: The pancreas appears mildly atrophy, but unchanged. No focal surrounding inflammation. Spleen: Normal in size without focal abnormality. There is a stable small splenule. Adrenals/Urinary Tract: Both adrenal glands appear normal. Both kidneys appear unremarkable as imaged in the noncontrast state. No evidence of urinary tract calculus or hydronephrosis. The bladder is partially collapsed, likely accounting for mild prominence of its wall. Stomach/Bowel: No evidence of bowel wall thickening, distention or surrounding inflammatory change. Retrocecal surgical clips consistent with previous appendectomy. Vascular/Lymphatic: There  are no enlarged abdominal or pelvic lymph nodes. Small retroperitoneal lymph nodes are not significantly enlarged. Stable aortic and branch vessel atherosclerosis. Reproductive: The uterus and ovaries appear stable. No evidence of adnexal mass. Other: Generalized anasarca with extensive subcutaneous edema. There is lesser mesenteric and retroperitoneal edema. A small amount of ascites is present. Musculoskeletal: No acute or significant osseous findings. Postsurgical changes status post lower lumbar fusion. There are injection granulomas in the buttocks bilaterally. IMPRESSION: 1. Anasarca with generalized soft tissue edema and moderate size bilateral pleural effusions. Mild ascites. 2. No acute intra-abdominal findings identified. 3. Aortic atherosclerosis and postsurgical changes as described. Electronically Signed   By: Richardean Sale M.D.   On: 04/08/2016 18:00  Ct Head Wo Contrast  Result Date: 04/08/2016 CLINICAL DATA:  Altered mental status and cyanosis EXAM: CT HEAD WITHOUT CONTRAST TECHNIQUE: Contiguous axial images were obtained from the base of the skull through the vertex without intravenous contrast. COMPARISON:  04/11/2008 FINDINGS: Bony calvarium is intact. No findings to suggest acute hemorrhage,  acute infarction or space-occupying mass lesion are noted. IMPRESSION: No acute abnormality noted. Electronically Signed   By: Inez Catalina M.D.   On: 04/08/2016 17:53  Dg Chest Port 1 View  Result Date: 04/18/2016 CLINICAL DATA:  Low-grade fever.  New onset cough. EXAM: PORTABLE CHEST 1 VIEW COMPARISON:  04/11/2016 FINDINGS: The cardiac silhouette is enlarged with stable preferential enlargement of the right atrium. Mediastinal contours appear intact. There is no evidence of pneumothorax. Decreased bilateral subpulmonic pulmonary fusions. Persistent interstitial pulmonary edema. Osseous structures are without acute abnormality. Soft tissues are grossly normal. IMPRESSION: Enlarged cardiac silhouette  with persistent interstitial pulmonary edema. Decreased in size subpulmonic pulmonary effusions. Electronically Signed   By: Fidela Salisbury M.D.   On: 04/18/2016 20:21   Dg Chest Port 1 View  Result Date: 04/13/2016 CLINICAL DATA:  Pulmonary edema EXAM: PORTABLE CHEST 1 VIEW COMPARISON:  04/11/2016 FINDINGS: Cardiomegaly with vascular congestion and interstitial prominence, likely interstitial edema. Small right pleural effusion with right base atelectasis or infiltrate. IMPRESSION: Suspect mild interstitial edema/CHF. Persistent right basilar atelectasis or infiltrate with right effusion. Electronically Signed   By: Rolm Baptise M.D.   On: 04/13/2016 07:49  Dg Chest Port 1 View  Result Date: 04/11/2016 CLINICAL DATA:  Decreasing oxygen saturation, COPD, right pleural effusion, CHF EXAM: PORTABLE CHEST 1 VIEW COMPARISON:  Portable chest x-ray of April 09, 2016 FINDINGS: The patient is positioned in a lordotic fashion. The lungs are mildly hyperinflated. Persistent increased density at the right lung base is compatible with a posterior layering pleural effusion. There is no pneumothorax. The cardiac silhouette is enlarged. The pulmonary vascularity is less prominent today. There may be a small left pleural effusion laterally. The observed bony thorax is unremarkable. IMPRESSION: Interval improvement in pulmonary interstitial edema. Persistent moderate-sized right-sided pleural effusion layering posteriorly. A small left pleural effusion is likely present. Stable cardiomegaly. Electronically Signed   By: Aubriana Ravelo  [place] M.D.   On: 04/11/2016 07:55  Dg Chest Port 1 View  Result Date: 04/09/2016 CLINICAL DATA:  Respiratory failure, history of COPD, CHF, diabetes EXAM: PORTABLE CHEST 1 VIEW COMPARISON:  Portable chest x-ray of April 08, 2016 FINDINGS: The lungs are reasonably well inflated. There is increased density in the right mid and lower lung worrisome for interstitial edema. Bilateral pleural  effusions layering posteriorly are suspected. The cardiac silhouette is enlarged. The pulmonary vascularity is mildly engorged. There is no pneumothorax. IMPRESSION: CHF with slight increased prominence of interstitial edema today especially on the right. Right basilar atelectasis or pneumonia. Probable smaller moderate-sized bilateral pleural effusions layering posteriorly. Electronically Signed   By: Thaddeus Evitts  [place] M.D.   On: 04/09/2016 07:33  Dg Chest Portable 1 View  Result Date: 04/08/2016 CLINICAL DATA:  Altered mental status with generalized body edema. Periorbital swelling with facial cyanosis. EXAM: PORTABLE CHEST 1 VIEW COMPARISON:  03/06/2016 and 01/07/2016. FINDINGS: 1545 hours. Overall lower lung volumes. The heart is enlarged. There is chronic vascular congestion with lesser aeration of the lung bases. A small amount of pleural fluid remains likely. There is no pneumothorax or overt pulmonary edema. The bones appear unchanged status post distal right clavicle resection. Surgical clips are present in the lower neck. IMPRESSION: Cardiomegaly with chronic vascular congestion. There is lesser aeration of the lung bases with possible bilateral pleural effusions. Electronically Signed   By: Richardean Sale M.D.   On: 04/08/2016 16:02        Discharge Exam: Vitals:   04/19/16 2222 04/20/16 0625  BP: (!) 141/106 Marland Kitchen)  154/65  Pulse: 78 (!) 118  Resp: 16 18  Temp: 98.2 F (36.8 C) 98.7 F (37.1 C)   Vitals:   04/19/16 2000 04/19/16 2222 04/20/16 0625 04/20/16 0832  BP:  (!) 141/106 (!) 154/65   Pulse: 78 78 (!) 118   Resp: 15 16 18    Temp:  98.2 F (36.8 C) 98.7 F (37.1 C)   TempSrc:  Oral Oral   SpO2: 95% 96% 90% 91%  Weight:   82.6 kg (182 lb 1.6 oz)   Height:        General: Pt is alert, awake, not in acute distress Cardiovascular: RRR, S1/S2 +, no rubs, no gallops Respiratory: bibasilar rales without wheeze Abdominal: Soft, NT, ND, bowel sounds + Extremities: trace LLE  edema, no cyanosis   The results of significant diagnostics from this hospitalization (including imaging, microbiology, ancillary and laboratory) are listed below for reference.    Significant Diagnostic Studies: Ct Abdomen Pelvis Wo Contrast  Result Date: 04/08/2016 CLINICAL DATA:  Altered mental status with generalized body edema and facial cyanosis. EXAM: CT ABDOMEN AND PELVIS WITHOUT CONTRAST TECHNIQUE: Multidetector CT imaging of the abdomen and pelvis was performed following the standard protocol without IV contrast. COMPARISON:  CT 12/20/2012.  Ultrasound 12/26/2013. FINDINGS: Lower chest: There are moderate size dependent pleural effusions, larger on the right. No significant pericardial effusion. There is atelectasis at both lung bases, worst in the right lower lobe. Hepatobiliary: The liver appears unremarkable as imaged in the noncontrast state. Previous cholecystectomy without evidence of significant biliary dilatation. Pancreas: The pancreas appears mildly atrophy, but unchanged. No focal surrounding inflammation. Spleen: Normal in size without focal abnormality. There is a stable small splenule. Adrenals/Urinary Tract: Both adrenal glands appear normal. Both kidneys appear unremarkable as imaged in the noncontrast state. No evidence of urinary tract calculus or hydronephrosis. The bladder is partially collapsed, likely accounting for mild prominence of its wall. Stomach/Bowel: No evidence of bowel wall thickening, distention or surrounding inflammatory change. Retrocecal surgical clips consistent with previous appendectomy. Vascular/Lymphatic: There are no enlarged abdominal or pelvic lymph nodes. Small retroperitoneal lymph nodes are not significantly enlarged. Stable aortic and branch vessel atherosclerosis. Reproductive: The uterus and ovaries appear stable. No evidence of adnexal mass. Other: Generalized anasarca with extensive subcutaneous edema. There is lesser mesenteric and  retroperitoneal edema. A small amount of ascites is present. Musculoskeletal: No acute or significant osseous findings. Postsurgical changes status post lower lumbar fusion. There are injection granulomas in the buttocks bilaterally. IMPRESSION: 1. Anasarca with generalized soft tissue edema and moderate size bilateral pleural effusions. Mild ascites. 2. No acute intra-abdominal findings identified. 3. Aortic atherosclerosis and postsurgical changes as described. Electronically Signed   By: Richardean Sale M.D.   On: 04/08/2016 18:00  Ct Head Wo Contrast  Result Date: 04/08/2016 CLINICAL DATA:  Altered mental status and cyanosis EXAM: CT HEAD WITHOUT CONTRAST TECHNIQUE: Contiguous axial images were obtained from the base of the skull through the vertex without intravenous contrast. COMPARISON:  04/11/2008 FINDINGS: Bony calvarium is intact. No findings to suggest acute hemorrhage, acute infarction or space-occupying mass lesion are noted. IMPRESSION: No acute abnormality noted. Electronically Signed   By: Inez Catalina M.D.   On: 04/08/2016 17:53  Dg Chest Port 1 View  Result Date: 04/18/2016 CLINICAL DATA:  Low-grade fever.  New onset cough. EXAM: PORTABLE CHEST 1 VIEW COMPARISON:  04/11/2016 FINDINGS: The cardiac silhouette is enlarged with stable preferential enlargement of the right atrium. Mediastinal contours appear intact. There is no  evidence of pneumothorax. Decreased bilateral subpulmonic pulmonary fusions. Persistent interstitial pulmonary edema. Osseous structures are without acute abnormality. Soft tissues are grossly normal. IMPRESSION: Enlarged cardiac silhouette with persistent interstitial pulmonary edema. Decreased in size subpulmonic pulmonary effusions. Electronically Signed   By: Fidela Salisbury M.D.   On: 04/18/2016 20:21   Dg Chest Port 1 View  Result Date: 04/13/2016 CLINICAL DATA:  Pulmonary edema EXAM: PORTABLE CHEST 1 VIEW COMPARISON:  04/11/2016 FINDINGS: Cardiomegaly with  vascular congestion and interstitial prominence, likely interstitial edema. Small right pleural effusion with right base atelectasis or infiltrate. IMPRESSION: Suspect mild interstitial edema/CHF. Persistent right basilar atelectasis or infiltrate with right effusion. Electronically Signed   By: Rolm Baptise M.D.   On: 04/13/2016 07:49  Dg Chest Port 1 View  Result Date: 04/11/2016 CLINICAL DATA:  Decreasing oxygen saturation, COPD, right pleural effusion, CHF EXAM: PORTABLE CHEST 1 VIEW COMPARISON:  Portable chest x-ray of April 09, 2016 FINDINGS: The patient is positioned in a lordotic fashion. The lungs are mildly hyperinflated. Persistent increased density at the right lung base is compatible with a posterior layering pleural effusion. There is no pneumothorax. The cardiac silhouette is enlarged. The pulmonary vascularity is less prominent today. There may be a small left pleural effusion laterally. The observed bony thorax is unremarkable. IMPRESSION: Interval improvement in pulmonary interstitial edema. Persistent moderate-sized right-sided pleural effusion layering posteriorly. A small left pleural effusion is likely present. Stable cardiomegaly. Electronically Signed   By: Chelesea Weiand  [place] M.D.   On: 04/11/2016 07:55  Dg Chest Port 1 View  Result Date: 04/09/2016 CLINICAL DATA:  Respiratory failure, history of COPD, CHF, diabetes EXAM: PORTABLE CHEST 1 VIEW COMPARISON:  Portable chest x-ray of April 08, 2016 FINDINGS: The lungs are reasonably well inflated. There is increased density in the right mid and lower lung worrisome for interstitial edema. Bilateral pleural effusions layering posteriorly are suspected. The cardiac silhouette is enlarged. The pulmonary vascularity is mildly engorged. There is no pneumothorax. IMPRESSION: CHF with slight increased prominence of interstitial edema today especially on the right. Right basilar atelectasis or pneumonia. Probable smaller moderate-sized bilateral pleural  effusions layering posteriorly. Electronically Signed   By: Christee Mervine  [place] M.D.   On: 04/09/2016 07:33  Dg Chest Portable 1 View  Result Date: 04/08/2016 CLINICAL DATA:  Altered mental status with generalized body edema. Periorbital swelling with facial cyanosis. EXAM: PORTABLE CHEST 1 VIEW COMPARISON:  03/06/2016 and 01/07/2016. FINDINGS: 1545 hours. Overall lower lung volumes. The heart is enlarged. There is chronic vascular congestion with lesser aeration of the lung bases. A small amount of pleural fluid remains likely. There is no pneumothorax or overt pulmonary edema. The bones appear unchanged status post distal right clavicle resection. Surgical clips are present in the lower neck. IMPRESSION: Cardiomegaly with chronic vascular congestion. There is lesser aeration of the lung bases with possible bilateral pleural effusions. Electronically Signed   By: Richardean Sale M.D.   On: 04/08/2016 16:02    Microbiology: Recent Results (from the past 240 hour(s))  Urine culture     Status: None   Collection Time: 04/19/16  1:40 AM  Result Value Ref Range Status   Specimen Description URINE, RANDOM  Final   Special Requests NONE  Final   Culture NO GROWTH  Final   Report Status 04/20/2016 FINAL  Final     Labs: Basic Metabolic Panel:  Recent Labs Lab 04/14/16 0349 04/15/16 [id number] 04/16/16 0416 04/17/16 0336 04/18/16 0753 04/19/16 0201 04/20/16 0445  NA 140 137 138  134* 132* 133* 136  K 4.0 3.8 3.8 4.3 4.3 4.0 4.4  CL 90* 90* 89* 85* 88* 93* 93*  CO2 42* 41* 40* 32 34* 32 31  GLUCOSE 123* 137* 149* 239* 164* 218* 205*  BUN 15 13 12 13 17 13 12   CREATININE 1.14* 1.10* 1.04* 1.61* 1.07* 1.04* 0.91  CALCIUM 9.6 9.5 9.6 9.8 9.4 9.1 9.8  MG 1.5* 1.4* 1.7 2.0  --   --  1.8   Liver Function Tests:  Recent Labs Lab 04/16/16 0416  AST 20  ALT 13*  ALKPHOS 51  BILITOT 0.9  PROT 6.2*  ALBUMIN 3.3*   No results for input(s): LIPASE, AMYLASE in the last 168 hours. No results for  input(s): AMMONIA in the last 168 hours. CBC:  Recent Labs Lab 04/14/16 0349 04/15/16 0609 04/16/16 0416 04/18/16 0753 04/19/16 0201 04/20/16 0445  WBC 6.9 6.6 8.4 12.5* 9.2 11.0*  NEUTROABS 3.6 3.7  --   --   --   --   HGB 9.3* 9.0* 9.2* 10.0* 9.4* 9.7*  HCT 33.5* 32.0* 32.1* 35.2* 32.4* 34.3*  MCV 83.3 83.8 83.2 84.0 83.5 84.7  PLT 201 200 229 289 259 318   Cardiac Enzymes:  Recent Labs Lab 04/18/16 2034 04/19/16 0201 04/19/16 0719 04/19/16 1420  TROPONINI 0.05* 0.06* 0.07* 0.11*   BNP: Invalid input(s): POCBNP CBG:  Recent Labs Lab 04/19/16 0620 04/19/16 1223 04/19/16 1722 04/19/16 2054 04/20/16 0554  GLUCAP 190* 209* 230* 255* 208*    Time coordinating discharge:  Greater than 30 minutes  Signed:  Maisa Bedingfield, DO [facility] Pager: [phone] 04/20/2016, 11:35 AM

## 2016-04-20 NOTE — Discharge Instructions (Signed)

## 2016-04-20 NOTE — Progress Notes (Signed)
Advanced Heart Failure Rounding Note  Referring Physician: Dia Crawford Primary Cardiologist: Nahser Reason for Consultation: SVT  Subjective:    S/p R/LHC as below. S/p DES to LAD. 04/18/16  Feeling well. Denies SOB. Weight up 9 pounds but on a different scale. Says she is peeing well. Has 3 drinks on her tray including a bottle of Dr. Malachi Bonds.    Echo 04/09/16:  EF 50-55% with anteroseptal and interoseptal HK.  RV dilated and moderately HY  RVSP 68   R/LHC 04/18/16 Prox RCA to Mid RCA lesion, 30 %stenosed. Mid RCA to Dist RCA lesion, 50 %stenosed. Dist RCA lesion, 30 %stenosed. Ost LAD to Prox LAD lesion, 30 %stenosed. Mid LAD lesion, 95 %stenosed.  Hemodynamics RA =  20 RV = 65/7/22 PA = 65/28 (40) PCW = 21 Fick cardiac output/index = 4.1/2.2 PVR = 4.7 WU FA sat = 92% PA sat = 50%, 48%  Assessment: 1. Moderate PAH 2. 2-vessel CAD with high-grade mid LAD lesion  Objective:   Weight Range: 82.6 kg (182 lb 1.6 oz) Body mass index is 32.26 kg/m.   Vital Signs:   Temp:  [97.4 F (36.3 C)-98.9 F (37.2 C)] 98.7 F (37.1 C) (08/05 0625) Pulse Rate:  [78-118] 118 (08/05 0625) Resp:  [13-32] 18 (08/05 0625) BP: (118-156)/(41-106) 154/65 (08/05 0625) SpO2:  [90 %-97 %] 91 % (08/05 0832) Weight:  [82.6 kg (182 lb 1.6 oz)] 82.6 kg (182 lb 1.6 oz) (08/05 0625) Last BM Date: 04/17/16  Weight change: Filed Weights   04/18/16 0450 04/19/16 0123 04/20/16 0625  Weight: 77.8 kg (171 lb 8.3 oz) 78.9 kg (173 lb 15.1 oz) 82.6 kg (182 lb 1.6 oz)    Intake/Output:   Intake/Output Summary (Last 24 hours) at 04/20/16 0902 Last data filed at 04/20/16 0600  Gross per 24 hour  Intake            282.5 ml  Output              550 ml  Net           -267.5 ml     Physical Exam: General: In bed. NAD HEENT: normal Neck: supple. JVD hard to seeBilateral CEA scars . No thyromegaly or nodule noted.  Cor: PMI nonpalpable . Irregular, tachy at times.  R radial cath site with good  pulse and no bruit.  Lungs: Diminished basilar sounds. Clear.  Abdomen: obese soft, NT, mildly distended, no HSM. No bruits or masses. +BS  Extremities: no cyanosis, clubbing, rash,  s/p R BKA  large ecchymosis on R forearm. LLE no erythema. Trace-1+ edema Neuro: alert & oriented x 3, cranial nerves grossly intact. moves all 4 extremities w/o difficulty. Affect pleasant.  Telemetry: AFL/atrial tach 75-120  Labs: CBC  Recent Labs  04/19/16 0201 04/20/16 0445  WBC 9.2 11.0*  HGB 9.4* 9.7*  HCT 32.4* 34.3*  MCV 83.5 84.7  PLT 259 0000000   Basic Metabolic Panel  Recent Labs  04/19/16 0201 04/20/16 0445  NA 133* 136  K 4.0 4.4  CL 93* 93*  CO2 32 31  GLUCOSE 218* 205*  BUN 13 12  CREATININE 1.04* 0.91  CALCIUM 9.1 9.8  MG  --  1.8   Liver Function Tests No results for input(s): AST, ALT, ALKPHOS, BILITOT, PROT, ALBUMIN in the last 72 hours. No results for input(s): LIPASE, AMYLASE in the last 72 hours. Cardiac Enzymes  Recent Labs  04/19/16 0201 04/19/16 0719 04/19/16 1420  TROPONINI 0.06*  0.07* 0.11*    BNP: BNP (last 3 results)  Recent Labs  01/07/16 2235 03/06/16 2134 04/08/16 1531  BNP 527.0* 653.0* 920.0*    ProBNP (last 3 results) No results for input(s): PROBNP in the last 8760 hours.   D-Dimer No results for input(s): DDIMER in the last 72 hours. Hemoglobin A1C No results for input(s): HGBA1C in the last 72 hours. Fasting Lipid Panel No results for input(s): CHOL, HDL, LDLCALC, TRIG, CHOLHDL, LDLDIRECT in the last 72 hours. Thyroid Function Tests No results for input(s): TSH, T4TOTAL, T3FREE, THYROIDAB in the last 72 hours.  Invalid input(s): FREET3  Other results:     Imaging/Studies:  Dg Chest Port 1 View  Result Date: 04/18/2016 CLINICAL DATA:  Low-grade fever.  New onset cough. EXAM: PORTABLE CHEST 1 VIEW COMPARISON:  04/11/2016 FINDINGS: The cardiac silhouette is enlarged with stable preferential enlargement of the right atrium.  Mediastinal contours appear intact. There is no evidence of pneumothorax. Decreased bilateral subpulmonic pulmonary fusions. Persistent interstitial pulmonary edema. Osseous structures are without acute abnormality. Soft tissues are grossly normal. IMPRESSION: Enlarged cardiac silhouette with persistent interstitial pulmonary edema. Decreased in size subpulmonic pulmonary effusions. Electronically Signed   By: Fidela Salisbury M.D.   On: 04/18/2016 20:21     Medications:     Scheduled Medications: . antiseptic oral rinse  7 mL Mouth Rinse q12n4p  . apixaban  5 mg Oral BID  . aspirin  81 mg Oral Daily  . atorvastatin  20 mg Oral q1800  . budesonide (PULMICORT) nebulizer solution  0.5 mg Nebulization BID  . citalopram  20 mg Oral Daily  . clopidogrel  75 mg Oral Q breakfast  . digoxin  0.125 mg Oral Daily  . diltiazem  240 mg Oral Daily  . gabapentin  300 mg Oral BID  . insulin aspart  0-15 Units Subcutaneous TID WC  . levalbuterol  1.25 mg Nebulization TID  . magnesium oxide  400 mg Oral BID  . pantoprazole  40 mg Oral Q1200  . sodium chloride flush  3 mL Intravenous Q12H  . vancomycin  1,250 mg Intravenous Q24H    Infusions:    PRN Medications: sodium chloride, acetaminophen, levalbuterol, ondansetron (ZOFRAN) IV, oxyCODONE, pneumococcal 13-valent conjugate vaccine, sodium chloride, sodium chloride flush   Assessment   1. Acute diastolic HF with R>>L symptoms and pulmonary HTN on echo  2. Acute on chronic respiratory failure 3. PAF/atrial flutter apparently new onset     This patients CHA2DS2-VASc Score and unadjusted Ischemic Stroke Rate (% per year) is equal to 7.2 % stroke rate/year from a score of 5 4. PAD s/p R BKA 5. Abnormal echo with anteroseptal/inferoseptal HK, RV HK and pulmonary HTN 6. Severe COPD 7. DM2 8. Cellulitis 9. AKI  10. Hypomagnesemia  Plan    Doing well. On cath has significant PAH and RV failure due to her lung disease. She is s/p LAD stent.  Will need to be aggressive about keeping her well oxygenated and keeping fluid off. Not candidate for pulmonary vasodilators.  Long talk about need for fluid restriction. I have started her on torsemide 40 daily. We can adjust as needed. She continues to have variable rate with AFL/atrial tach despite diltiazem. Will add low dose amio to help with rate control. Will be cautious given degree of lung disease. May consider DC-CV after 4 weeks of AC.   Cellulitis resolved. Can stop vancomycin   D/c on  1) ECASA 81 daily (stop after 30 days) 2)  Plavix 75mg  daily 3) Eliquis 5 bid 4) Torsemide 40 daily 5) spiro 12.5 daily 6) Atorva 20 7) Diltiazem 240 daily 8) Amio 200 daily.  9) Stop lisinopril and metoprolol for now   Can go to SNF fro our standpoint. Will need BMET in 3 days and 7 days. Watch volume status closely and adjust diuretics as needed  F/u in HF clinic  Bensimhon, Daniel,MD 9:02 AM Advanced Heart Failure Team Pager [phone] (M-F; 7a - 4p)  Please contact [facility] for night-coverage after hours (4p -7a ) and weekends on [email or web address]

## 2016-04-20 NOTE — Clinical Social Work Note (Signed)
CSW facilitated patient discharge including contacting patient family and facility to confirm patient discharge plans. Clinical information faxed to facility and family agreeable with plan. CSW arranged ambulance transport via [place] to [facility] in [place] around 2:00 pm. RN to call report prior to discharge [phone]).  CSW will sign off for now as social work intervention is no longer needed. Please consult [place] again if new needs arise.  [place] Scrape, Cyl[place]

## 2016-05-02 ENCOUNTER — Inpatient Hospital Stay (HOSPITAL_COMMUNITY): Admission: RE | Admit: 2016-05-02 | Payer: Medicare Other | Source: Ambulatory Visit

## 2016-05-10 ENCOUNTER — Other Ambulatory Visit: Payer: Self-pay | Admitting: Cardiology

## 2016-05-10 ENCOUNTER — Telehealth: Payer: Self-pay | Admitting: Cardiology

## 2016-05-10 ENCOUNTER — Telehealth (HOSPITAL_COMMUNITY): Payer: Self-pay | Admitting: Vascular Surgery

## 2016-05-10 MED ORDER — APIXABAN 5 MG PO TABS: 5.0000 mg | ORAL_TABLET | Freq: Two times a day (BID) | ORAL | 2 refills | Status: DC

## 2016-05-10 MED ORDER — CLOPIDOGREL BISULFATE 75 MG PO TABS: 75.0000 mg | ORAL_TABLET | Freq: Every day | ORAL | 12 refills | Status: AC

## 2016-05-10 NOTE — Telephone Encounter (Signed)
Returned call to resch pt hosp f/u

## 2016-05-10 NOTE — Telephone Encounter (Signed)
Received call about patient's medications. Patient returned home today from rehab center and her daughter in law (whom I spoke with) went to pharmacy to pick up the patient's medications. Eliquis cost about $500 so they did not pick up, patient's daughter wanted me to call in Coumadin per pharmacist recommendation. I advised her that I could not dose the Coumadin without seeing the patient first and doing some blood work.   In talking with her I learned that the patient had not taken Plavix (got DES to her LAD at the beginning of this month). She tells me that the doctor at the rehab facility stopped the Plavix as the patient was having frequent nose bleeds. I told the patient's daughter in law that it is imperative that the patient take aspirin and Plavix as she just had a stent placed and she could risk having a heart attack if she did not take it. I escribed the Plavix Rx to the pharmacy and urged patient to pick up, she agreed.   I advised the patient's daughter in law to perhaps pay for a weekend's worth of Eliquis (4 tabs) and call office on Monday to schedule an appointment. She is on Eliquis for PAF. We discussed the risks of stroke with no anticoagulation and PAF. Daughter in law verbalized understanding.

## 2016-05-13 ENCOUNTER — Encounter (HOSPITAL_COMMUNITY): Payer: Self-pay

## 2016-05-13 ENCOUNTER — Telehealth (HOSPITAL_COMMUNITY): Payer: Self-pay | Admitting: *Deleted

## 2016-05-13 ENCOUNTER — Emergency Department (HOSPITAL_COMMUNITY): Payer: Medicare Other

## 2016-05-13 ENCOUNTER — Inpatient Hospital Stay (HOSPITAL_COMMUNITY)
Admission: EM | Admit: 2016-05-13 | Discharge: 2016-05-16 | DRG: 291 | Disposition: A | Discharge status: Home / Self Care | Payer: Medicare Other | Attending: Family Medicine | Admitting: Family Medicine

## 2016-05-13 DIAGNOSIS — Z955 Presence of coronary angioplasty implant and graft: Secondary | ICD-10-CM

## 2016-05-13 DIAGNOSIS — I739 Peripheral vascular disease, unspecified: Secondary | ICD-10-CM | POA: Diagnosis present

## 2016-05-13 DIAGNOSIS — J9621 Acute and chronic respiratory failure with hypoxia: Secondary | ICD-10-CM

## 2016-05-13 DIAGNOSIS — M25551 Pain in right hip: Secondary | ICD-10-CM | POA: Diagnosis present

## 2016-05-13 DIAGNOSIS — D649 Anemia, unspecified: Secondary | ICD-10-CM | POA: Diagnosis present

## 2016-05-13 DIAGNOSIS — Z87891 Personal history of nicotine dependence: Secondary | ICD-10-CM

## 2016-05-13 DIAGNOSIS — Z8673 Personal history of transient ischemic attack (TIA), and cerebral infarction without residual deficits: Secondary | ICD-10-CM

## 2016-05-13 DIAGNOSIS — M545 Low back pain, unspecified: Secondary | ICD-10-CM

## 2016-05-13 DIAGNOSIS — J410 Simple chronic bronchitis: Secondary | ICD-10-CM

## 2016-05-13 DIAGNOSIS — I480 Paroxysmal atrial fibrillation: Secondary | ICD-10-CM | POA: Diagnosis present

## 2016-05-13 DIAGNOSIS — J9601 Acute respiratory failure with hypoxia: Secondary | ICD-10-CM

## 2016-05-13 DIAGNOSIS — M25559 Pain in unspecified hip: Secondary | ICD-10-CM

## 2016-05-13 DIAGNOSIS — J9622 Acute and chronic respiratory failure with hypercapnia: Secondary | ICD-10-CM | POA: Diagnosis present

## 2016-05-13 DIAGNOSIS — Z89611 Acquired absence of right leg above knee: Secondary | ICD-10-CM

## 2016-05-13 DIAGNOSIS — R0902 Hypoxemia: Secondary | ICD-10-CM

## 2016-05-13 DIAGNOSIS — E118 Type 2 diabetes mellitus with unspecified complications: Secondary | ICD-10-CM

## 2016-05-13 DIAGNOSIS — Z89619 Acquired absence of unspecified leg above knee: Secondary | ICD-10-CM

## 2016-05-13 DIAGNOSIS — K219 Gastro-esophageal reflux disease without esophagitis: Secondary | ICD-10-CM | POA: Diagnosis present

## 2016-05-13 DIAGNOSIS — G546 Phantom limb syndrome with pain: Secondary | ICD-10-CM

## 2016-05-13 DIAGNOSIS — E1165 Type 2 diabetes mellitus with hyperglycemia: Secondary | ICD-10-CM | POA: Diagnosis present

## 2016-05-13 DIAGNOSIS — Z8249 Family history of ischemic heart disease and other diseases of the circulatory system: Secondary | ICD-10-CM

## 2016-05-13 DIAGNOSIS — G8929 Other chronic pain: Secondary | ICD-10-CM

## 2016-05-13 DIAGNOSIS — I2510 Atherosclerotic heart disease of native coronary artery without angina pectoris: Secondary | ICD-10-CM | POA: Diagnosis present

## 2016-05-13 DIAGNOSIS — E0800 Diabetes mellitus due to underlying condition with hyperosmolarity without nonketotic hyperglycemic-hyperosmolar coma (NKHHC): Secondary | ICD-10-CM

## 2016-05-13 DIAGNOSIS — I509 Heart failure, unspecified: Secondary | ICD-10-CM

## 2016-05-13 DIAGNOSIS — Z794 Long term (current) use of insulin: Secondary | ICD-10-CM

## 2016-05-13 DIAGNOSIS — I5033 Acute on chronic diastolic (congestive) heart failure: Secondary | ICD-10-CM | POA: Diagnosis present

## 2016-05-13 DIAGNOSIS — Z9981 Dependence on supplemental oxygen: Secondary | ICD-10-CM

## 2016-05-13 DIAGNOSIS — E785 Hyperlipidemia, unspecified: Secondary | ICD-10-CM | POA: Diagnosis present

## 2016-05-13 DIAGNOSIS — J441 Chronic obstructive pulmonary disease with (acute) exacerbation: Secondary | ICD-10-CM

## 2016-05-13 DIAGNOSIS — I5031 Acute diastolic (congestive) heart failure: Secondary | ICD-10-CM | POA: Diagnosis present

## 2016-05-13 DIAGNOSIS — Z809 Family history of malignant neoplasm, unspecified: Secondary | ICD-10-CM

## 2016-05-13 DIAGNOSIS — Z7902 Long term (current) use of antithrombotics/antiplatelets: Secondary | ICD-10-CM

## 2016-05-13 DIAGNOSIS — E119 Type 2 diabetes mellitus without complications: Secondary | ICD-10-CM

## 2016-05-13 DIAGNOSIS — I110 Hypertensive heart disease with heart failure: Secondary | ICD-10-CM | POA: Diagnosis not present

## 2016-05-13 DIAGNOSIS — R269 Unspecified abnormalities of gait and mobility: Secondary | ICD-10-CM

## 2016-05-13 DIAGNOSIS — Z7982 Long term (current) use of aspirin: Secondary | ICD-10-CM

## 2016-05-13 DIAGNOSIS — Z823 Family history of stroke: Secondary | ICD-10-CM

## 2016-05-13 DIAGNOSIS — Z7901 Long term (current) use of anticoagulants: Secondary | ICD-10-CM

## 2016-05-13 DIAGNOSIS — I10 Essential (primary) hypertension: Secondary | ICD-10-CM | POA: Diagnosis present

## 2016-05-13 DIAGNOSIS — IMO0002 Reserved for concepts with insufficient information to code with codable children: Secondary | ICD-10-CM | POA: Diagnosis present

## 2016-05-13 LAB — CBC WITH DIFFERENTIAL/PLATELET
BASOS PCT: 0 %
Basophils Absolute: 0 10*3/uL (ref 0.0–0.1)
EOS ABS: 0.2 10*3/uL (ref 0.0–0.7)
EOS PCT: 1 %
HCT: 33 % — ABNORMAL LOW (ref 36.0–46.0)
Hemoglobin: 9.9 g/dL — ABNORMAL LOW (ref 12.0–15.0)
Lymphocytes Relative: 21 %
Lymphs Abs: 2.5 10*3/uL (ref 0.7–4.0)
MCH: 25.4 pg — ABNORMAL LOW (ref 26.0–34.0)
MCHC: 30 g/dL (ref 30.0–36.0)
MCV: 84.8 fL (ref 78.0–100.0)
MONO ABS: 1.1 10*3/uL — AB (ref 0.1–1.0)
MONOS PCT: 10 %
Neutro Abs: 7.9 10*3/uL — ABNORMAL HIGH (ref 1.7–7.7)
Neutrophils Relative %: 68 %
Platelets: 304 10*3/uL (ref 150–400)
RBC: 3.89 MIL/uL (ref 3.87–5.11)
RDW: 19.7 % — AB (ref 11.5–15.5)
WBC: 11.7 10*3/uL — ABNORMAL HIGH (ref 4.0–10.5)

## 2016-05-13 LAB — PROTIME-INR
INR: 1.37
Prothrombin Time: 17 seconds — ABNORMAL HIGH (ref 11.4–15.2)

## 2016-05-13 LAB — BASIC METABOLIC PANEL
Anion gap: 10 (ref 5–15)
BUN: 24 mg/dL — ABNORMAL HIGH (ref 6–20)
CALCIUM: 9.2 mg/dL (ref 8.9–10.3)
CO2: 32 mmol/L (ref 22–32)
CREATININE: 1.31 mg/dL — AB (ref 0.44–1.00)
Chloride: 89 mmol/L — ABNORMAL LOW (ref 101–111)
GFR calc non Af Amer: 42 mL/min — ABNORMAL LOW (ref >60)
GFR, EST AFRICAN AMERICAN: 48 mL/min — AB (ref >60)
Glucose, Bld: 165 mg/dL — ABNORMAL HIGH (ref 65–99)
Potassium: 3.7 mmol/L (ref 3.5–5.1)
SODIUM: 131 mmol/L — AB (ref 135–145)

## 2016-05-13 LAB — BLOOD GAS, ARTERIAL
ACID-BASE EXCESS: 6.6 mmol/L — AB (ref 0.0–2.0)
ALLENS TEST (PASS/FAIL): POSITIVE — AB
BICARBONATE: 29.4 meq/L — AB (ref 20.0–24.0)
DRAWN BY: 382351
FIO2: 36
O2 Saturation: 90.7 %
PCO2 ART: 61.6 mmHg — AB (ref 35.0–45.0)
PH ART: 7.338 — AB (ref 7.350–7.450)
Patient temperature: 37
pO2, Arterial: 70.7 mmHg — ABNORMAL LOW (ref 80.0–100.0)

## 2016-05-13 LAB — TROPONIN I

## 2016-05-13 LAB — BRAIN NATRIURETIC PEPTIDE: B NATRIURETIC PEPTIDE 5: 240 pg/mL — AB (ref 0.0–100.0)

## 2016-05-13 MED ORDER — OXYCODONE-ACETAMINOPHEN 5-325 MG PO TABS
2.0000 | ORAL_TABLET | Freq: Once | ORAL | Status: AC
  Administered 2016-05-13: 2 via ORAL
  Filled 2016-05-13: qty 2

## 2016-05-13 MED ORDER — FUROSEMIDE 10 MG/ML IJ SOLN
20.0000 mg | Freq: Once | INTRAMUSCULAR | Status: AC
  Administered 2016-05-13: 20 mg via INTRAVENOUS
  Filled 2016-05-13: qty 2

## 2016-05-13 NOTE — ED Triage Notes (Signed)
Her oxygen dropped down to 67% and she is on 4 liters of oxygen.  When she takes really deep breaths it will come up.  Her sugar was up 286 and I gave her 10 units of novolog.  I do not feel short of breath today.  She is on a fluid restriction due to having 2 stents placed today.  She has been sleeping a lot today.  They told me to bring her to the ER if her oxygen dropped below 91%.  Her home pulse ox does not read the same as the triage equipment.

## 2016-05-13 NOTE — ED Notes (Signed)
Informed EDP of pts blood gas results.

## 2016-05-13 NOTE — Telephone Encounter (Signed)
Pt's daughter-in-law to let us know she went to p/u pt's Eliquis this weekend and it was $500 which she can not afford.  She states has [employer]  Called pharmacy, they state med needs PA  [employer] at [phone], completed PA, med approved through 09/15/16, ref # [id number]  Pharmacy aware and state med has a $45 copay  Pt's daughter-in-law is aware and will get med for pt today

## 2016-05-13 NOTE — ED Provider Notes (Signed)
[facility] DEPT Provider Note   CSN: [id number] Arrival date & time: 05/13/16  1918  By signing my name below, I, Gwenlyn Fudge, attest that this documentation has been prepared under the direction and in the presence of Ezequiel Essex, MD. Electronically Signed: Gwenlyn Fudge, ED Scribe. 05/13/16. 8:37 PM.   History   Chief Complaint No chief complaint on file.  The history is provided by the patient and a relative. No language interpreter was used.    HPI Comments: [name] is a 64 y.o. female with PMHx of COPD, GERD, CHF, DM, Hypoxia, HTN, HLD, and Stroke who presents to the Emergency Department complaining of gradual onset, consant hypoxia onset earlier today. She states she has been breathing better now than at home. Family states pt's oxygen saturation dropped down to 67% on 4 L. Pt states she is normally on 4 L of oxygen at home. She states her lips started to turn purple which prompted her family to check her oxygen saturation. Pt states she has lower back pain that is baseline. Sister states the pt has been sleeping a lot today. Family states her blood sugar has been running above normal last week. Family states her sugar was 286 and she received 10 units of Novolog about 10-20 minutes PTA. Pt last took Eliquis last night and Plavix this morning. Pt had 2 stents placed in her heart 3 weeks ago. Pt denies chest pain, leg swelling, leg pain, fever, vomiting, diarrhea, abdominal pain.  Past Medical History:  Diagnosis Date  . Anxiety   . Arthritis    "back" (10/30/2015)  . CHF (congestive heart failure) ([place])   . Chronic lower back pain   . COPD (chronic obstructive pulmonary disease) ([place])   . GERD (gastroesophageal reflux disease)   . Hyperlipidemia   . Hypertension   . Hypoxia   . Peripheral vascular disease ([place])   . Shingles Dec. 2016   Scalp  . Stroke ([place])   . Type II diabetes mellitus [facility])     Patient Active Problem List   Diagnosis Date Noted  . Acute on  chronic respiratory failure ([place]) 04/18/2016  . Acute on chronic diastolic CHF (congestive heart failure) ([place]) 04/18/2016  . CAD in native artery   . Atrial flutter ([place]) 04/15/2016  . Anemia, unspecified 04/15/2016  . Pleural effusion   . Congestive dilated cardiomyopathy ([place])   . Pulmonary hypertension ([place])   . Acute on chronic renal failure ([place])   . Uncontrolled type 2 diabetes mellitus with complication ([place])   . Anasarca   . Acute diastolic heart failure ([place])   . PAF (paroxysmal atrial fibrillation) ([place])   . Metabolic acidosis 99991111  . Altered mental state 04/08/2016  . COPD exacerbation ([place]) 03/06/2016  . COPD with acute exacerbation ([place]) 01/08/2016  . Acute respiratory failure with hypoxia ([place]) 01/08/2016  . Sepsis ([place]) 11/14/2015  . Pressure ulcer 11/14/2015  . Hypoxia 11/03/2015  . CHF (congestive heart failure) ([place]) 11/03/2015  . S/P AKA (above knee amputation) unilateral ([place])   . Abnormality of gait   . Acute blood loss anemia   . Postoperative pain of extremity   . Phantom limb pain ([place])   . Essential hypertension   . Chronic obstructive pulmonary disease ([place])   . Chronic low back pain   . Poorly controlled type 2 diabetes mellitus with peripheral neuropathy ([place])   . PVD (peripheral vascular disease) ([place])   . Respiratory rate decreased   . Leukocytosis   .  Atherosclerosis of right leg ([place]) 10/30/2015  . Acute pulmonary edema ([place]) 08/18/2015  . CHF exacerbation ([place]) 08/18/2015  . SOB (shortness of breath) 08/18/2015  . Open wound of knee, leg (except thigh), and ankle 08/18/2015  . Abnormal echocardiogram   . SVT (supraventricular tachycardia) ([place])   . Lower limb ischemia 04/09/2015  . Critical lower limb ischemia 04/09/2015  . AKI (acute kidney injury) ([place]) 04/09/2015  . Hyperkalemia 04/09/2015  . Pancreatitis 12/26/2013  . Tachycardia 12/26/2013  . Dehydration 12/26/2013  . Vomiting 12/26/2013  . COPD (chronic obstructive  pulmonary disease) ([place]) 12/26/2013  . Essential hypertension, benign 12/26/2013  . Diabetes ([place]) 12/26/2013  . Hypomagnesemia 12/26/2013    Past Surgical History:  Procedure Laterality Date  . AMPUTATION Right 10/30/2015   Procedure: RIGHT ABOVE KNEE AMPUTATION;  Surgeon: Elam Dutch, MD;  Location: [facility];  Service: Vascular;  Laterality: Right;  . BACK SURGERY    . CARDIAC CATHETERIZATION    . CARDIAC CATHETERIZATION N/A 04/18/2016   Procedure: Right/Left Heart Cath and Coronary Angiography;  Surgeon: Jolaine Artist, MD;  Location: [facility];  Service: Cardiovascular;  Laterality: N/A;  . CARDIAC CATHETERIZATION N/A 04/18/2016   Procedure: Coronary Stent Intervention;  Surgeon: Belva Crome, MD;  Location: [facility];  Service: Cardiovascular;  Laterality: N/A;  . CAROTID ENDARTERECTOMY Right 2010   Dr Kellie Simmering   . CAROTID ENDARTERECTOMY Left 03/2008   Archie Endo 01/15/2011  . FASCIOTOMY Right 04/10/2015   Procedure: FOUR COMPARTMENT FASCIOTOMY;  Surgeon: Elam Dutch, MD;  Location: [facility];  Service: Vascular;  Laterality: Right;  . FEMORAL-POPLITEAL BYPASS GRAFT Right 04/10/2015   Procedure: RIGHT  FEMORAL-BELOW THE KNEE POPLITEAL ARTERY BYPASS GRAFT USING 6 MM X 80 CM PROPATEN GRAFT;  Surgeon: Elam Dutch, MD;  Location: [facility];  Service: Vascular;  Laterality: Right;  . FOOT SURGERY Bilateral    "from standing on concrete all day"  . INTRAOPERATIVE ARTERIOGRAM Right 04/10/2015   Procedure: INTRA OPERATIVE ARTERIOGRAM;  Surgeon: Elam Dutch, MD;  Location: [facility];  Service: Vascular;  Laterality: Right;  . LAPAROSCOPIC CHOLECYSTECTOMY    . LEG AMPUTATION THROUGH KNEE Right 10/30/2015  . PERIPHERAL VASCULAR CATHETERIZATION N/A 04/10/2015   Procedure: Abdominal Aortogram w/Lower Extremity;  Surgeon: Conrad [place], MD;  Location: [facility];  Service: Cardiovascular;  Laterality: N/A;  . PERIPHERAL VASCULAR CATHETERIZATION N/A 10/20/2015   Procedure:  Abdominal Aortogram;  Surgeon: Elam Dutch, MD;  Location: [facility];  Service: Cardiovascular;  Laterality: N/A;  . POSTERIOR LUMBAR FUSION    . SHOULDER OPEN ROTATOR CUFF REPAIR Right 02/2006   Archie Endo 01/29/2011  . THROMBECTOMY FEMORAL ARTERY Right 04/10/2015   Procedure: THROMBECTOMY RIGHT TIBIAL  ARTERY WITH VEIN PATCH ANGIOPLASTY;  Surgeon: Elam Dutch, MD;  Location: [facility];  Service: Vascular;  Laterality: Right;    OB History    Gravida Para Term Preterm AB Living   3 3 3     3    SAB TAB Ectopic Multiple Live Births                   Home Medications    Prior to Admission medications   Medication Sig Start Date End Date Taking? Authorizing Provider  albuterol (PROVENTIL HFA;VENTOLIN HFA) 108 (90 Base) MCG/ACT inhaler Inhale 2 puffs into the lungs every 6 (six) hours as needed for wheezing or shortness of breath.   Yes Historical Provider, MD  ALPRAZolam (  XANAX) 0.25 MG tablet Take 1 tablet (0.25 mg total) by mouth 2 (two) times daily. 04/20/16  Yes Orson Eva, MD  amiodarone (PACERONE) 200 MG tablet Take 1 tablet (200 mg total) by mouth daily. Patient taking differently: Take 200 mg by mouth every morning.  04/20/16  Yes Orson Eva, MD  apixaban (ELIQUIS) 5 MG TABS tablet Take 1 tablet (5 mg total) by mouth 2 (two) times daily. 05/10/16  Yes Arbutus Leas, NP  atorvastatin (LIPITOR) 20 MG tablet Take 1 tablet (20 mg total) by mouth daily at 6 PM. Patient taking differently: Take 20 mg by mouth every morning.  04/15/15  Yes Thurnell Lose, MD  citalopram (CELEXA) 20 MG tablet Take 20 mg by mouth every morning.  07/04/13  Yes Historical Provider, MD  clopidogrel (PLAVIX) 75 MG tablet Take 1 tablet (75 mg total) by mouth daily with breakfast. Patient taking differently: Take 75 mg by mouth every morning.  05/10/16  Yes Arbutus Leas, NP  diltiazem (CARDIZEM CD) 240 MG 24 hr capsule Take 1 capsule (240 mg total) by mouth daily. Patient taking differently: Take 240 mg by  mouth every morning.  04/20/16  Yes David Tat, MD  fluticasone furoate-vilanterol (BREO ELLIPTA) 100-25 MCG/INH AEPB Inhale 1 puff into the lungs daily. 03/09/16  Yes Lucia Gaskins, MD  gabapentin (NEURONTIN) 300 MG capsule Take 300 mg by mouth 2 (two) times daily.    Yes Historical Provider, MD  insulin aspart (NOVOLOG) 100 UNIT/ML injection Inject 10 Units into the skin 3 (three) times daily before meals.    Yes Historical Provider, MD  Insulin Glargine (LANTUS SOLOSTAR) 100 UNIT/ML Solostar Pen Inject 15 Units into the skin at bedtime. 04/20/16  Yes Orson Eva, MD  ipratropium-albuterol (DUONEB) 0.5-2.5 (3) MG/3ML SOLN Take 3 mLs by nebulization every 6 (six) hours as needed (wheezing).    Yes Historical Provider, MD  metFORMIN (GLUCOPHAGE) 500 MG tablet Take 500 mg by mouth 2 (two) times daily with a meal.   Yes Historical Provider, MD  oxyCODONE-acetaminophen (PERCOCET) 10-325 MG tablet Take 1 tablet by mouth 4 (four) times daily as needed for pain. Patient taking differently: Take 1 tablet by mouth every 4 (four) hours as needed for pain.  04/20/16  Yes Orson Eva, MD  pantoprazole (PROTONIX) 40 MG tablet Take 40 mg by mouth every morning.    Yes Historical Provider, MD  sitaGLIPtin (JANUVIA) 100 MG tablet Take 100 mg by mouth every morning.   Yes Historical Provider, MD  spironolactone (ALDACTONE) 25 MG tablet Take 0.5 tablets (12.5 mg total) by mouth daily. Patient taking differently: Take 12.5 mg by mouth every morning.  04/20/16  Yes Orson Eva, MD  torsemide (DEMADEX) 20 MG tablet Take 2 tablets (40 mg total) by mouth daily. Patient taking differently: Take 20 mg by mouth 2 (two) times daily.  04/20/16  Yes Orson Eva, MD  aspirin 81 MG chewable tablet Chew 1 tablet (81 mg total) by mouth daily. X 30 days only Patient not taking: Reported on 05/13/2016 04/20/16   Orson Eva, MD  cephALEXin (KEFLEX) 500 MG capsule Take 1 capsule (500 mg total) by mouth every 8 (eight) hours. Patient not taking: Reported  on 05/13/2016 04/20/16   Orson Eva, MD  doxycycline (VIBRA-TABS) 100 MG tablet Take 1 tablet (100 mg total) by mouth every 12 (twelve) hours. Patient not taking: Reported on 05/13/2016 04/20/16   Orson Eva, MD    Family History Family History  Problem Relation Age of  Onset  . Cancer Father   . Heart attack Sister   . Stroke Sister   . Hypertension Sister   . Hypertension Brother     Social History Social History  Substance Use Topics  . Smoking status: Former Smoker    Packs/day: 0.12    Years: 49.00    Types: Cigarettes  . Smokeless tobacco: Never Used  . Alcohol use No     Allergies   Codeine; Penicillins; and Ciprofloxacin   Review of Systems Review of Systems A complete 10 system review of systems was obtained and all systems are negative except as noted in the HPI and PMH.    Physical Exam Updated Vital Signs BP (!) 122/43   Pulse 63   Temp 98.1 F (36.7 C) (Tympanic)   Resp 17   Ht 5\' 6"  (1.676 m)   Wt 158 lb (71.7 kg)   SpO2 98%   BMI 25.50 kg/m   Physical Exam  Constitutional: She is oriented to person, place, and time. She appears well-developed and well-nourished. No distress.  appears comfortable  HENT:  Head: Normocephalic and atraumatic.  Mouth/Throat: Oropharynx is clear and moist. No oropharyngeal exudate.  Eyes: Conjunctivae and EOM are normal. Pupils are equal, round, and reactive to light.  Neck: Normal range of motion. Neck supple.  No meningismus.  Cardiovascular: Normal rate, regular rhythm, normal heart sounds and intact distal pulses.   No murmur heard. difficult to feel dt and dp pulses  Pulmonary/Chest: Effort normal and breath sounds normal. No respiratory distress.  diminished breath sounds without wheezing  Abdominal: Soft. There is no tenderness. There is no rebound and no guarding.  Musculoskeletal: Normal range of motion. She exhibits no edema or tenderness.  right aka is wll healed trace edema of left leg  Neurological: She  is alert and oriented to person, place, and time. No cranial nerve deficit. She exhibits normal muscle tone. Coordination normal.  No ataxia on finger to nose bilaterally. No pronator drift. 5/5 strength throughout. CN 2-12 intact.Equal grip strength. Sensation intact.   Skin: Skin is warm.  Psychiatric: She has a normal mood and affect. Her behavior is normal.  Nursing note and vitals reviewed.   ED Treatments / Results  DIAGNOSTIC STUDIES: Oxygen Saturation is 97% on [place] @ 5 L/m, adequate by my interpretation.    COORDINATION OF CARE: 8:21 PM Discussed treatment plan with pt at bedside which includes arterial blood gas and pt agreed to plan.  Labs (all labs ordered are listed, but only abnormal results are displayed) Labs Reviewed  CBC WITH DIFFERENTIAL/PLATELET - Abnormal; Notable for the following:       Result Value   WBC 11.7 (*)    Hemoglobin 9.9 (*)    HCT 33.0 (*)    MCH 25.4 (*)    RDW 19.7 (*)    Neutro Abs 7.9 (*)    Monocytes Absolute 1.1 (*)    All other components within normal limits  BASIC METABOLIC PANEL - Abnormal; Notable for the following:    Sodium 131 (*)    Chloride 89 (*)    Glucose, Bld 165 (*)    BUN 24 (*)    Creatinine, Ser 1.31 (*)    GFR calc non Af Amer 42 (*)    GFR calc Af Amer 48 (*)    All other components within normal limits  BRAIN NATRIURETIC PEPTIDE - Abnormal; Notable for the following:    B Natriuretic Peptide 240.0 (*)  All other components within normal limits  PROTIME-INR - Abnormal; Notable for the following:    Prothrombin Time 17.0 (*)    All other components within normal limits  BLOOD GAS, ARTERIAL - Abnormal; Notable for the following:    pH, Arterial 7.338 (*)    pCO2 arterial 61.6 (*)    pO2, Arterial 70.7 (*)    Bicarbonate 29.4 (*)    Acid-Base Excess 6.6 (*)    Allens test (pass/fail) POSITIVE (*)    All other components within normal limits  TROPONIN I    EKG  EKG Interpretation  Date/Time:  Monday  May 13 2016 20:13:20 EDT Ventricular Rate:  64 PR Interval:    QRS Duration: 115 QT Interval:  439 QTC Calculation: 453 R Axis:   -68 Text Interpretation:  Atrial fibrillation LAD, consider left anterior fascicular block Lateral infarct, old now atrial fibrillation  Confirmed by Wyvonnia Dusky  MD, Neomi Laidler [phone]) on 05/13/2016 8:42:43 PM       Radiology Dg Chest Portable 1 View  Result Date: 05/13/2016 CLINICAL DATA:  Shortness of breath. History of COPD, CHF, hypertension, diabetes, cardiac catheterization, former smoker. EXAM: PORTABLE CHEST 1 VIEW COMPARISON:  04/18/2016 FINDINGS: Cardiac enlargement. Pulmonary vascularity appears normal. Mild interstitial pattern to the lung bases may represent mild edema. Similar appearance to previous study. No focal airspace disease or consolidation. Probable small bilateral pleural effusions. No pneumothorax. Calcification of the aorta. Previous resection or resorption of the distal right clavicle. IMPRESSION: Cardiac enlargement with mild interstitial pattern to the lung bases probably representing edema. Small effusions. Similar appearance to previous study. Electronically Signed   By: Lucienne Capers M.D.   On: 05/13/2016 20:38    Procedures Procedures (including critical care time)  Medications Ordered in ED Medications - No data to display   Initial Impression / Assessment and Plan / ED Course  I have reviewed the triage vital signs and the nursing notes.  Pertinent labs & imaging results that were available during my care of the patient were reviewed by me and considered in my medical decision making (see chart for details).  Clinical Course  patient with Afib, COPD, CHF on home O2 presenting with hypoxia and cyanosis at home.  Now improved on 6L [place] here. No chest pain. Contrary to nursing note, cardiac stents were 25 days ago, not today.  Decreased breath sounds at bases CXR with slight edema and congestion. IV lasix given.  Slight  CO2 retention on ABG but normal mentation. Hold bipap at this time. States she is breathing at her baseline and O2 weaned to 4L.  On eliquis, doubt PE.  Plan admission for diuresis given tenuous status and recent hospitalizations. D/w Dr. Darrick Meigs.  Final Clinical Impressions(s) / ED Diagnoses   Final diagnoses:  Acute on chronic diastolic congestive heart failure (HCC)  Hypoxia    New Prescriptions New Prescriptions   No medications on file   I personally performed the services described in this documentation, which was scribed in my presence. The recorded information has been reviewed and is accurate.   Ezequiel Essex, MD 05/14/16 0200

## 2016-05-13 NOTE — ED Notes (Signed)
Xray at the bedside, called RT for ABG

## 2016-05-13 NOTE — ED Notes (Signed)
Per daughter in law, pt has been very sleepy today.  Recent hospitalization and been home a week from the  [facility]

## 2016-05-14 ENCOUNTER — Observation Stay (HOSPITAL_COMMUNITY): Payer: Medicare Other

## 2016-05-14 DIAGNOSIS — M25551 Pain in right hip: Secondary | ICD-10-CM | POA: Diagnosis present

## 2016-05-14 DIAGNOSIS — I110 Hypertensive heart disease with heart failure: Secondary | ICD-10-CM | POA: Diagnosis present

## 2016-05-14 DIAGNOSIS — J9621 Acute and chronic respiratory failure with hypoxia: Secondary | ICD-10-CM | POA: Diagnosis present

## 2016-05-14 DIAGNOSIS — Z8673 Personal history of transient ischemic attack (TIA), and cerebral infarction without residual deficits: Secondary | ICD-10-CM | POA: Diagnosis not present

## 2016-05-14 DIAGNOSIS — R0902 Hypoxemia: Secondary | ICD-10-CM | POA: Diagnosis present

## 2016-05-14 DIAGNOSIS — I5031 Acute diastolic (congestive) heart failure: Secondary | ICD-10-CM

## 2016-05-14 DIAGNOSIS — Z809 Family history of malignant neoplasm, unspecified: Secondary | ICD-10-CM | POA: Diagnosis not present

## 2016-05-14 DIAGNOSIS — E1165 Type 2 diabetes mellitus with hyperglycemia: Secondary | ICD-10-CM | POA: Diagnosis present

## 2016-05-14 DIAGNOSIS — J441 Chronic obstructive pulmonary disease with (acute) exacerbation: Secondary | ICD-10-CM | POA: Diagnosis not present

## 2016-05-14 DIAGNOSIS — J9622 Acute and chronic respiratory failure with hypercapnia: Secondary | ICD-10-CM | POA: Diagnosis present

## 2016-05-14 DIAGNOSIS — E785 Hyperlipidemia, unspecified: Secondary | ICD-10-CM | POA: Diagnosis present

## 2016-05-14 DIAGNOSIS — Z823 Family history of stroke: Secondary | ICD-10-CM | POA: Diagnosis not present

## 2016-05-14 DIAGNOSIS — I5033 Acute on chronic diastolic (congestive) heart failure: Secondary | ICD-10-CM | POA: Diagnosis present

## 2016-05-14 DIAGNOSIS — D649 Anemia, unspecified: Secondary | ICD-10-CM | POA: Diagnosis present

## 2016-05-14 DIAGNOSIS — I2510 Atherosclerotic heart disease of native coronary artery without angina pectoris: Secondary | ICD-10-CM | POA: Diagnosis present

## 2016-05-14 DIAGNOSIS — Z7901 Long term (current) use of anticoagulants: Secondary | ICD-10-CM | POA: Diagnosis not present

## 2016-05-14 DIAGNOSIS — Z89611 Acquired absence of right leg above knee: Secondary | ICD-10-CM | POA: Diagnosis not present

## 2016-05-14 DIAGNOSIS — I480 Paroxysmal atrial fibrillation: Secondary | ICD-10-CM | POA: Diagnosis present

## 2016-05-14 DIAGNOSIS — Z794 Long term (current) use of insulin: Secondary | ICD-10-CM

## 2016-05-14 DIAGNOSIS — Z955 Presence of coronary angioplasty implant and graft: Secondary | ICD-10-CM | POA: Diagnosis not present

## 2016-05-14 DIAGNOSIS — E0810 Diabetes mellitus due to underlying condition with ketoacidosis without coma: Secondary | ICD-10-CM | POA: Diagnosis not present

## 2016-05-14 DIAGNOSIS — Z7902 Long term (current) use of antithrombotics/antiplatelets: Secondary | ICD-10-CM | POA: Diagnosis not present

## 2016-05-14 DIAGNOSIS — K219 Gastro-esophageal reflux disease without esophagitis: Secondary | ICD-10-CM | POA: Diagnosis present

## 2016-05-14 DIAGNOSIS — Z9981 Dependence on supplemental oxygen: Secondary | ICD-10-CM | POA: Diagnosis not present

## 2016-05-14 DIAGNOSIS — Z7982 Long term (current) use of aspirin: Secondary | ICD-10-CM | POA: Diagnosis not present

## 2016-05-14 DIAGNOSIS — Z8249 Family history of ischemic heart disease and other diseases of the circulatory system: Secondary | ICD-10-CM | POA: Diagnosis not present

## 2016-05-14 DIAGNOSIS — Z87891 Personal history of nicotine dependence: Secondary | ICD-10-CM | POA: Diagnosis not present

## 2016-05-14 LAB — CBC
HCT: 31.2 % — ABNORMAL LOW (ref 36.0–46.0)
HEMOGLOBIN: 9.5 g/dL — AB (ref 12.0–15.0)
MCH: 25.7 pg — ABNORMAL LOW (ref 26.0–34.0)
MCHC: 30.4 g/dL (ref 30.0–36.0)
MCV: 84.3 fL (ref 78.0–100.0)
Platelets: 250 10*3/uL (ref 150–400)
RBC: 3.7 MIL/uL — ABNORMAL LOW (ref 3.87–5.11)
RDW: 19.5 % — ABNORMAL HIGH (ref 11.5–15.5)
WBC: 9.5 10*3/uL (ref 4.0–10.5)

## 2016-05-14 LAB — GLUCOSE, CAPILLARY
GLUCOSE-CAPILLARY: 422 mg/dL — AB (ref 65–99)
Glucose-Capillary: 157 mg/dL — ABNORMAL HIGH (ref 65–99)
Glucose-Capillary: 389 mg/dL — ABNORMAL HIGH (ref 65–99)
Glucose-Capillary: 409 mg/dL — ABNORMAL HIGH (ref 65–99)

## 2016-05-14 LAB — COMPREHENSIVE METABOLIC PANEL
ALBUMIN: 3.9 g/dL (ref 3.5–5.0)
ALK PHOS: 66 U/L (ref 38–126)
ALT: 12 U/L — ABNORMAL LOW (ref 14–54)
ANION GAP: 8 (ref 5–15)
AST: 20 U/L (ref 15–41)
BUN: 22 mg/dL — AB (ref 6–20)
CALCIUM: 9.4 mg/dL (ref 8.9–10.3)
CO2: 34 mmol/L — AB (ref 22–32)
Chloride: 92 mmol/L — ABNORMAL LOW (ref 101–111)
Creatinine, Ser: 1.12 mg/dL — ABNORMAL HIGH (ref 0.44–1.00)
GFR calc Af Amer: 58 mL/min — ABNORMAL LOW (ref >60)
GFR calc non Af Amer: 50 mL/min — ABNORMAL LOW (ref >60)
GLUCOSE: 107 mg/dL — AB (ref 65–99)
POTASSIUM: 3.8 mmol/L (ref 3.5–5.1)
SODIUM: 134 mmol/L — AB (ref 135–145)
Total Bilirubin: 0.8 mg/dL (ref 0.3–1.2)
Total Protein: 7.1 g/dL (ref 6.5–8.1)

## 2016-05-14 LAB — IRON AND TIBC
IRON: 42 ug/dL (ref 28–170)
Saturation Ratios: 10 % — ABNORMAL LOW (ref 10.4–31.8)
TIBC: 428 ug/dL (ref 250–450)
UIBC: 386 ug/dL

## 2016-05-14 LAB — RETICULOCYTES
RBC.: 3.79 MIL/uL — ABNORMAL LOW (ref 3.87–5.11)
Retic Count, Absolute: 106.1 10*3/uL (ref 19.0–186.0)
Retic Ct Pct: 2.8 % (ref 0.4–3.1)

## 2016-05-14 LAB — FERRITIN: Ferritin: 83 ng/mL (ref 11–307)

## 2016-05-14 LAB — VITAMIN B12: VITAMIN B 12: 643 pg/mL (ref 180–914)

## 2016-05-14 LAB — FOLATE: Folate: 13.1 ng/mL (ref >5.9)

## 2016-05-14 MED ORDER — CLOPIDOGREL BISULFATE 75 MG PO TABS
75.0000 mg | ORAL_TABLET | Freq: Every morning | ORAL | Status: DC
  Administered 2016-05-14 – 2016-05-16 (×3): 75 mg via ORAL
  Filled 2016-05-14 (×3): qty 1

## 2016-05-14 MED ORDER — OXYCODONE-ACETAMINOPHEN 5-325 MG PO TABS
2.0000 | ORAL_TABLET | ORAL | Status: DC | PRN
Start: 2016-05-14 — End: 2016-05-16
  Administered 2016-05-14 – 2016-05-16 (×9): 2 via ORAL
  Filled 2016-05-14 (×9): qty 2

## 2016-05-14 MED ORDER — INSULIN ASPART 100 UNIT/ML ~~LOC~~ SOLN
0.0000 [IU] | Freq: Three times a day (TID) | SUBCUTANEOUS | Status: DC
  Administered 2016-05-14 (×2): 15 [IU] via SUBCUTANEOUS
  Administered 2016-05-14: 3 [IU] via SUBCUTANEOUS
  Administered 2016-05-15: 15 [IU] via SUBCUTANEOUS
  Administered 2016-05-15: 8 [IU] via SUBCUTANEOUS
  Administered 2016-05-15 – 2016-05-16 (×2): 15 [IU] via SUBCUTANEOUS

## 2016-05-14 MED ORDER — APIXABAN 5 MG PO TABS
5.0000 mg | ORAL_TABLET | Freq: Once | ORAL | Status: AC
  Administered 2016-05-14: 5 mg via ORAL
  Filled 2016-05-14: qty 1

## 2016-05-14 MED ORDER — AMIODARONE HCL 200 MG PO TABS
200.0000 mg | ORAL_TABLET | Freq: Every morning | ORAL | Status: DC
  Administered 2016-05-14 – 2016-05-16 (×3): 200 mg via ORAL
  Filled 2016-05-14 (×3): qty 1

## 2016-05-14 MED ORDER — FUROSEMIDE 10 MG/ML IJ SOLN
20.0000 mg | Freq: Two times a day (BID) | INTRAMUSCULAR | Status: DC
  Administered 2016-05-14 – 2016-05-16 (×7): 20 mg via INTRAVENOUS
  Filled 2016-05-14 (×6): qty 2

## 2016-05-14 MED ORDER — ALPRAZOLAM 0.25 MG PO TABS
0.2500 mg | ORAL_TABLET | Freq: Two times a day (BID) | ORAL | Status: DC
  Administered 2016-05-14 – 2016-05-16 (×6): 0.25 mg via ORAL
  Filled 2016-05-14 (×6): qty 1

## 2016-05-14 MED ORDER — APIXABAN 5 MG PO TABS
5.0000 mg | ORAL_TABLET | Freq: Two times a day (BID) | ORAL | Status: DC
  Administered 2016-05-14 – 2016-05-16 (×5): 5 mg via ORAL
  Filled 2016-05-14 (×5): qty 1

## 2016-05-14 MED ORDER — SPIRONOLACTONE 25 MG PO TABS
12.5000 mg | ORAL_TABLET | Freq: Every morning | ORAL | Status: DC
  Administered 2016-05-14 – 2016-05-16 (×3): 12.5 mg via ORAL
  Filled 2016-05-14 (×3): qty 1

## 2016-05-14 MED ORDER — IPRATROPIUM-ALBUTEROL 0.5-2.5 (3) MG/3ML IN SOLN
3.0000 mL | Freq: Four times a day (QID) | RESPIRATORY_TRACT | Status: DC
  Administered 2016-05-14 (×3): 3 mL via RESPIRATORY_TRACT
  Filled 2016-05-14 (×3): qty 3

## 2016-05-14 MED ORDER — ASPIRIN 81 MG PO CHEW
81.0000 mg | CHEWABLE_TABLET | Freq: Every day | ORAL | Status: DC
  Administered 2016-05-14 – 2016-05-15 (×2): 81 mg via ORAL
  Filled 2016-05-14 (×2): qty 1

## 2016-05-14 MED ORDER — PANTOPRAZOLE SODIUM 40 MG PO TBEC
40.0000 mg | DELAYED_RELEASE_TABLET | Freq: Every morning | ORAL | Status: DC
  Administered 2016-05-14 – 2016-05-16 (×3): 40 mg via ORAL
  Filled 2016-05-14 (×3): qty 1

## 2016-05-14 MED ORDER — IPRATROPIUM-ALBUTEROL 0.5-2.5 (3) MG/3ML IN SOLN
3.0000 mL | Freq: Four times a day (QID) | RESPIRATORY_TRACT | Status: DC
  Administered 2016-05-15 – 2016-05-16 (×5): 3 mL via RESPIRATORY_TRACT
  Filled 2016-05-14 (×5): qty 3

## 2016-05-14 MED ORDER — ALBUTEROL SULFATE (2.5 MG/3ML) 0.083% IN NEBU: 2.5000 mg | INHALATION_SOLUTION | RESPIRATORY_TRACT | Status: DC | PRN

## 2016-05-14 MED ORDER — METHYLPREDNISOLONE SODIUM SUCC 125 MG IJ SOLR
60.0000 mg | Freq: Four times a day (QID) | INTRAMUSCULAR | Status: DC
  Administered 2016-05-14: 60 mg via INTRAVENOUS
  Administered 2016-05-14: 09:00:00 via INTRAVENOUS
  Administered 2016-05-14 – 2016-05-16 (×7): 60 mg via INTRAVENOUS
  Filled 2016-05-14 (×9): qty 2

## 2016-05-14 MED ORDER — INSULIN GLARGINE 100 UNIT/ML ~~LOC~~ SOLN
15.0000 [IU] | Freq: Every day | SUBCUTANEOUS | Status: DC
  Administered 2016-05-14 – 2016-05-15 (×2): 15 [IU] via SUBCUTANEOUS
  Filled 2016-05-14 (×3): qty 0.15

## 2016-05-14 MED ORDER — ATORVASTATIN CALCIUM 20 MG PO TABS
20.0000 mg | ORAL_TABLET | Freq: Every day | ORAL | Status: DC
  Administered 2016-05-14 – 2016-05-15 (×2): 20 mg via ORAL
  Filled 2016-05-14 (×2): qty 1

## 2016-05-14 MED ORDER — GABAPENTIN 300 MG PO CAPS
300.0000 mg | ORAL_CAPSULE | Freq: Two times a day (BID) | ORAL | Status: DC
  Administered 2016-05-14 (×2): 300 mg via ORAL
  Administered 2016-05-14: 200 mg via ORAL
  Administered 2016-05-15 – 2016-05-16 (×3): 300 mg via ORAL
  Filled 2016-05-14 (×2): qty 1
  Filled 2016-05-14: qty 3
  Filled 2016-05-14 (×3): qty 1

## 2016-05-14 MED ORDER — CITALOPRAM HYDROBROMIDE 20 MG PO TABS
20.0000 mg | ORAL_TABLET | Freq: Every morning | ORAL | Status: DC
  Administered 2016-05-14 – 2016-05-16 (×3): 20 mg via ORAL
  Filled 2016-05-14 (×3): qty 1

## 2016-05-14 MED ORDER — DILTIAZEM HCL ER COATED BEADS 240 MG PO CP24
240.0000 mg | ORAL_CAPSULE | Freq: Every morning | ORAL | Status: DC
  Administered 2016-05-14 – 2016-05-16 (×3): 240 mg via ORAL
  Filled 2016-05-14 (×3): qty 1

## 2016-05-14 NOTE — Progress Notes (Signed)
Nutrition Brief Note  Patient identified on the Malnutrition Screening Tool (MST) Report  Wt Readings from Last 15 Encounters:  05/14/16 166 lb 7.2 oz (75.5 kg)  04/20/16 182 lb 1.6 oz (82.6 kg)  03/07/16 201 lb 11.9 oz (91.5 kg)  01/12/16 185 lb 10 oz (84.2 kg)  12/14/15 174 lb (78.9 kg)  11/23/15 174 lb (78.9 kg)  11/15/15 174 lb 2.6 oz (79 kg)  10/30/15 168 lb (76.2 kg)  10/20/15 170 lb (77.1 kg)  10/19/15 179 lb (81.2 kg)  10/06/15 183 lb (83 kg)  08/23/15 199 lb 15.3 oz (90.7 kg)  07/13/15 182 lb (82.6 kg)  06/08/15 172 lb (78 kg)  05/30/15 174 lb (78.9 kg)   Body mass index is 26.87 kg/m. Patient meets criteria for overweight based on current BMI.   Pt had stated she had a poor appetite and had lost some weight on the MST.   Today she states her po intake "was just that one day...yesterday morning".  In regards to her weight loss, she says she lost "96 lbs of fluid in 1 month" . She was referring to her CHF exacerbation at the end of July for which she was admitted to [facility]. Her weight got as high 224 lbs.  She states her appetite is fine now. She admits to drinking too much fluid at home. She says she "I love my drinks". She believes she has been getting better with this; she says she has not had a soda in over a month. She requested a diet soda for her meals.   Current diet order is Heart Healthy, patient consumed 100% of meals at this time.   No nutrition interventions warranted at this time. If nutrition issues arise, please consult RD.   Burtis Junes RD, LDN, CNSC Clinical Nutrition Pager: [phone] 05/14/2016 11:53 AM

## 2016-05-14 NOTE — H&P (Addendum)
TRH H&P   Patient Demographics:    [name], is a 65 y.o. female  MRN: [id number]  DOB - 19-Jul-1951  Admit Date - 05/13/2016  Outpatient Primary MD for the patient is Maricela Curet, MD  Referring MD/NP/PA:  Dr Wyvonnia Dusky  Patient coming from: Home  No chief complaint on file.     HPI:    [name]  is a 65 y.o. female, History of COPD, GERD, congestive heart failure, diabetes mellitus, hypertension, stroke who came to the ED with hypoxic episode at home. Patient usually is on 4 L oxygen via nasal cannula, and a daughter in law  noted that her lips were purple and her O2 sats  were 66% on 4 L.Patient has been sleeping more than usual today. She was just discharged from the skilled facility. Patient had left and right heart cath on 04/18/2016 which showed 95% mid LAD stenosis status post drug-eluting stent. Patient also started on Eliquis for paroxysmal atrial fibrillation with RVR.  She denies chest pain, no nausea vomiting or diarrhea. No fever or dysuria     Review  of systems:    In addition to the HPI above,  No Fever-chills, No Headache, No changes with Vision or hearing, No problems swallowing food or Liquids, No Abdominal pain, No Nausea or Vomiting, bowel movements are regular, No Blood in stool or Urine, No dysuria, No new skin rashes or bruises, No new joints pains-aches,  No new weakness, tingling, numbness in any extremity, No recent weight gain or loss, No polyuria, polydypsia or polyphagia, No significant Mental Stressors.  A full 10 point Review of Systems was done, except as stated above, all other Review of Systems were negative.   With Past History of the following :    Past Medical History:  Diagnosis Date  . Anxiety   . Arthritis    "back" (10/30/2015)  . CHF (congestive heart failure) ([place])   . Chronic lower back pain   . COPD (chronic obstructive pulmonary  disease) ([place])   . GERD (gastroesophageal reflux disease)   . Hyperlipidemia   . Hypertension   . Hypoxia   . Peripheral vascular disease ([place])   . Shingles Dec. 2016   Scalp  . Stroke ([place])   . Type II diabetes mellitus ([place])       Past Surgical History:  Procedure Laterality Date  . AMPUTATION Right 10/30/2015   Procedure: RIGHT ABOVE KNEE AMPUTATION;  Surgeon: Elam Dutch, MD;  Location: [facility];  Service: Vascular;  Laterality: Right;  . BACK SURGERY    . CARDIAC CATHETERIZATION    . CARDIAC CATHETERIZATION N/A 04/18/2016   Procedure: Right/Left Heart Cath and Coronary Angiography;  Surgeon: Jolaine Artist, MD;  Location: [facility];  Service: Cardiovascular;  Laterality: N/A;  . CARDIAC CATHETERIZATION N/A 04/18/2016   Procedure: Coronary Stent Intervention;  Surgeon: Belva Crome, MD;  Location: [facility];  Service: Cardiovascular;  Laterality: N/A;  . CAROTID ENDARTERECTOMY Right 2010  Dr Kellie Simmering   . CAROTID ENDARTERECTOMY Left 03/2008   Archie Endo 01/15/2011  . FASCIOTOMY Right 04/10/2015   Procedure: FOUR COMPARTMENT FASCIOTOMY;  Surgeon: Elam Dutch, MD;  Location: [facility];  Service: Vascular;  Laterality: Right;  . FEMORAL-POPLITEAL BYPASS GRAFT Right 04/10/2015   Procedure: RIGHT  FEMORAL-BELOW THE KNEE POPLITEAL ARTERY BYPASS GRAFT USING 6 MM X 80 CM PROPATEN GRAFT;  Surgeon: Elam Dutch, MD;  Location: [facility];  Service: Vascular;  Laterality: Right;  . FOOT SURGERY Bilateral    "from standing on concrete all day"  . INTRAOPERATIVE ARTERIOGRAM Right 04/10/2015   Procedure: INTRA OPERATIVE ARTERIOGRAM;  Surgeon: Elam Dutch, MD;  Location: [facility];  Service: Vascular;  Laterality: Right;  . LAPAROSCOPIC CHOLECYSTECTOMY    . LEG AMPUTATION THROUGH KNEE Right 10/30/2015  . PERIPHERAL VASCULAR CATHETERIZATION N/A 04/10/2015   Procedure: Abdominal Aortogram w/Lower Extremity;  Surgeon: Conrad Whitwell, MD;  Location: [facility];  Service:  Cardiovascular;  Laterality: N/A;  . PERIPHERAL VASCULAR CATHETERIZATION N/A 10/20/2015   Procedure: Abdominal Aortogram;  Surgeon: Elam Dutch, MD;  Location: [facility];  Service: Cardiovascular;  Laterality: N/A;  . POSTERIOR LUMBAR FUSION    . SHOULDER OPEN ROTATOR CUFF REPAIR Right 02/2006   Archie Endo 01/29/2011  . THROMBECTOMY FEMORAL ARTERY Right 04/10/2015   Procedure: THROMBECTOMY RIGHT TIBIAL  ARTERY WITH VEIN PATCH ANGIOPLASTY;  Surgeon: Elam Dutch, MD;  Location: [facility];  Service: Vascular;  Laterality: Right;      Social History:     Social History  Substance Use Topics  . Smoking status: Former Smoker    Packs/day: 0.12    Years: 49.00    Types: Cigarettes  . Smokeless tobacco: Never Used  . Alcohol use No       Family History :     Family History  Problem Relation Age of Onset  . Cancer Father   . Heart attack Sister   . Stroke Sister   . Hypertension Sister   . Hypertension Brother       Home Medications:   Prior to Admission medications   Medication Sig Start Date End Date Taking? Authorizing Provider  albuterol (PROVENTIL HFA;VENTOLIN HFA) 108 (90 Base) MCG/ACT inhaler Inhale 2 puffs into the lungs every 6 (six) hours as needed for wheezing or shortness of breath.   Yes Historical Provider, MD  ALPRAZolam (XANAX) 0.25 MG tablet Take 1 tablet (0.25 mg total) by mouth 2 (two) times daily. 04/20/16  Yes Orson Eva, MD  amiodarone (PACERONE) 200 MG tablet Take 1 tablet (200 mg total) by mouth daily. Patient taking differently: Take 200 mg by mouth every morning.  04/20/16  Yes Orson Eva, MD  apixaban (ELIQUIS) 5 MG TABS tablet Take 1 tablet (5 mg total) by mouth 2 (two) times daily. 05/10/16  Yes Arbutus Leas, NP  atorvastatin (LIPITOR) 20 MG tablet Take 1 tablet (20 mg total) by mouth daily at 6 PM. Patient taking differently: Take 20 mg by mouth every morning.  04/15/15  Yes Thurnell Lose, MD  citalopram (CELEXA) 20 MG tablet Take 20 mg by mouth  every morning.  07/04/13  Yes Historical Provider, MD  clopidogrel (PLAVIX) 75 MG tablet Take 1 tablet (75 mg total) by mouth daily with breakfast. Patient taking differently: Take 75 mg by mouth every morning.  05/10/16  Yes Arbutus Leas, NP  diltiazem (CARDIZEM CD) 240 MG 24 hr capsule Take 1 capsule (240 mg total)  by mouth daily. Patient taking differently: Take 240 mg by mouth every morning.  04/20/16  Yes David Tat, MD  fluticasone furoate-vilanterol (BREO ELLIPTA) 100-25 MCG/INH AEPB Inhale 1 puff into the lungs daily. 03/09/16  Yes Lucia Gaskins, MD  gabapentin (NEURONTIN) 300 MG capsule Take 300 mg by mouth 2 (two) times daily.    Yes Historical Provider, MD  insulin aspart (NOVOLOG) 100 UNIT/ML injection Inject 10 Units into the skin 3 (three) times daily before meals.    Yes Historical Provider, MD  Insulin Glargine (LANTUS SOLOSTAR) 100 UNIT/ML Solostar Pen Inject 15 Units into the skin at bedtime. 04/20/16  Yes Orson Eva, MD  ipratropium-albuterol (DUONEB) 0.5-2.5 (3) MG/3ML SOLN Take 3 mLs by nebulization every 6 (six) hours as needed (wheezing).    Yes Historical Provider, MD  metFORMIN (GLUCOPHAGE) 500 MG tablet Take 500 mg by mouth 2 (two) times daily with a meal.   Yes Historical Provider, MD  oxyCODONE-acetaminophen (PERCOCET) 10-325 MG tablet Take 1 tablet by mouth 4 (four) times daily as needed for pain. Patient taking differently: Take 1 tablet by mouth every 4 (four) hours as needed for pain.  04/20/16  Yes Orson Eva, MD  pantoprazole (PROTONIX) 40 MG tablet Take 40 mg by mouth every morning.    Yes Historical Provider, MD  sitaGLIPtin (JANUVIA) 100 MG tablet Take 100 mg by mouth every morning.   Yes Historical Provider, MD  spironolactone (ALDACTONE) 25 MG tablet Take 0.5 tablets (12.5 mg total) by mouth daily. Patient taking differently: Take 12.5 mg by mouth every morning.  04/20/16  Yes Orson Eva, MD  torsemide (DEMADEX) 20 MG tablet Take 2 tablets (40 mg total) by mouth  daily. Patient taking differently: Take 20 mg by mouth 2 (two) times daily.  04/20/16  Yes Orson Eva, MD  aspirin 81 MG chewable tablet Chew 1 tablet (81 mg total) by mouth daily. X 30 days only Patient not taking: Reported on 05/13/2016 04/20/16   Orson Eva, MD  cephALEXin (KEFLEX) 500 MG capsule Take 1 capsule (500 mg total) by mouth every 8 (eight) hours. Patient not taking: Reported on 05/13/2016 04/20/16   Orson Eva, MD  doxycycline (VIBRA-TABS) 100 MG tablet Take 1 tablet (100 mg total) by mouth every 12 (twelve) hours. Patient not taking: Reported on 05/13/2016 04/20/16   Orson Eva, MD     Allergies:     Allergies  Allergen Reactions  . Codeine Rash  . Penicillins Other (See Comments)    Has patient had a PCN reaction causing immediate rash, facial/tongue/throat swelling, SOB or lightheadedness with hypotension: unknown Has patient had a PCN reaction causing severe rash involving mucus membranes or skin necrosis: unknown Has patient had a PCN reaction that required hospitalization unknown Has patient had a PCN reaction occurring within the last 10 years: unknown If all of the above answers are "NO", then may proceed with Cephalosporin use.  . Ciprofloxacin Hives     Physical Exam:   Vitals  Blood pressure 131/68, pulse 65, temperature 98.1 F (36.7 C), temperature source Tympanic, resp. rate 16, height 5\' 6"  (1.676 m), weight 71.7 kg (158 lb), SpO2 100 %.   1. General Caucasian female* lying in bed in NAD, cooperative*with exam   2. Normal affect and insight, Awake Alert, Oriented X 3.  3. No F.N deficits, ALL C.Nerves Intact, Strength 5/5 all 4 extremities, Sensation intact all 4 extremities, Plantars down going.  4. Ears and Eyes appear Normal, Conjunctivae clear, PERRLA. Moist Oral Mucosa.  5. Supple Neck, No JVD, No cervical lymphadenopathy appriciated, No Carotid Bruits.  6. Symmetrical Chest wall movement, Good air movement bilaterally, Bibibasilar rhonchi  7. RRR, No  Gallops, Rubs or Murmurs, No Parasternal Heave.No Leg edema  8. Positive Bowel Sounds, Abdomen Soft, No tenderness, No organomegaly appriciated,No rebound -guarding or rigidity.  9.  No Cyanosis, Normal Skin Turgor, No Skin Rash or Bruise.  10. Status post right above-knee amputation       Data Review:    CBC  Recent Labs Lab 05/13/16 2030  WBC 11.7*  HGB 9.9*  HCT 33.0*  PLT 304  MCV 84.8  MCH 25.4*  MCHC 30.0  RDW 19.7*  LYMPHSABS 2.5  MONOABS 1.1*  EOSABS 0.2  BASOSABS 0.0   ------------------------------------------------------------------------------------------------------------------  Chemistries   Recent Labs Lab 05/13/16 2030  NA 131*  K 3.7  CL 89*  CO2 32  GLUCOSE 165*  BUN 24*  CREATININE 1.31*  CALCIUM 9.2   ------------------------------------------------------------------------------------------------------------------  ------------------------------------------------------------------------------------------------------------------ Coagulation Profile:  Recent Labs Lab 05/13/16 2030  INR 1.37   Cardiac Enzymes:  Recent Labs Lab 05/13/16 2030  TROPONINI <0.03    --------------------------------------------------------------------------------------------------------------- Urine analysis:    Component Value Date/Time   COLORURINE YELLOW 04/19/2016 0140   APPEARANCEUR CLOUDY (A) 04/19/2016 0140   LABSPEC 1.040 (H) 04/19/2016 0140   PHURINE 6.5 04/19/2016 0140   GLUCOSEU 250 (A) 04/19/2016 0140   HGBUR TRACE (A) 04/19/2016 0140   BILIRUBINUR SMALL (A) 04/19/2016 0140   KETONESUR NEGATIVE 04/19/2016 0140   PROTEINUR 30 (A) 04/19/2016 0140   UROBILINOGEN 1.0 04/12/2015 0915   NITRITE NEGATIVE 04/19/2016 0140   LEUKOCYTESUR NEGATIVE 04/19/2016 0140      ----------------------------------------------------------------------------------------------------------------   Imaging Results:    Dg Chest Portable 1 View  Result  Date: 05/13/2016 CLINICAL DATA:  Shortness of breath. History of COPD, CHF, hypertension, diabetes, cardiac catheterization, former smoker. EXAM: PORTABLE CHEST 1 VIEW COMPARISON:  04/18/2016 FINDINGS: Cardiac enlargement. Pulmonary vascularity appears normal. Mild interstitial pattern to the lung bases may represent mild edema. Similar appearance to previous study. No focal airspace disease or consolidation. Probable small bilateral pleural effusions. No pneumothorax. Calcification of the aorta. Previous resection or resorption of the distal right clavicle. IMPRESSION: Cardiac enlargement with mild interstitial pattern to the lung bases probably representing edema. Small effusions. Similar appearance to previous study. Electronically Signed   By: Lucienne Capers M.D.   On: 05/13/2016 20:38    My personal review of EKG: Rhythm atrial fibrillation    Assessment & Plan:    Active Problems:   Essential hypertension, benign   Diabetes (HCC)   CHF exacerbation (HCC)   S/P AKA (above knee amputation) unilateral (HCC)   CHF (congestive heart failure) (HCC)   Acute respiratory failure with hypoxia (HCC)   COPD exacerbation (HCC)   Uncontrolled type 2 diabetes mellitus with complication (HCC)   Acute diastolic heart failure (HCC)   PAF (paroxysmal atrial fibrillation) (HCC)   Acute on chronic respiratory failure with hypoxemia ([place])   1. Acute on chronic hypoxic and hypercarbic respiratory failure - likely multifactorial from diastolic heart failure as well as COPD exacerbation. Continue Lasix 20 mg IV every 12 hours, strict intake and output, Foley catheter to gravity. Will also start Solu-Medrol 60 g IV every 6 hours, DuoNeb nebulizers every 6 hours.  2.  Diabetes mellitus - start sliding scale insulin with NovoLog, continue Lantus \ 3. Paroxysmal atrial fibrillation - heart rate is controlled, continue amiodarone , eliquis  for anticoagulation  4.  Right hip pain- patient fell at the skilled  facility 3 days ago, still complains of right hip pain. Check x-ray of the hip. 5. CAD- status post cardiac cath on 8-17 which showed 95% mid LAD stenosis and patient is status post DES. Continue aspirin and Plavix    DVT Prophylaxis-   Eliquis  AM Labs Ordered, also please review Full Orders  Family Communication: Admission, patients condition and plan of care including tests being ordered have been discussed with the patient and *her daughter-in-law at bedside* who indicate understanding and agree with the plan and Code Status.  Code Status:  Full code  Admission status: Observation    Time spent in minutes : 55 minutes   Idalie Canto S M.D on 05/14/2016 at 1:03 AM  Between 7am to 7pm - Pager - [phone]. After 7pm go to [email or web address] - password [email or web address]  [facility] - Office  [phone]

## 2016-05-14 NOTE — Care Management Obs Status (Signed)
[facility] NOTIFICATION   Patient Details  Name: [name] MRN: [id number] Date of Birth: December 16, 1950   Medicare Observation Status Notification Given:  Yes    Abdulhamid Olgin, Chauncey Reading, RN 05/14/2016, 3:15 PM

## 2016-05-14 NOTE — Progress Notes (Signed)
Patient admitted with the combination of volume overload and COPD exacerbation currently on DuoNeb nebulizer intravenous Solu-Medrol as well as IV Lasix will monitor electrolytes and respiratory status serially [name] [id number] DOB: [birth year] DOA: 05/13/2016 PCP: Maricela Curet, MD   Physical Exam: Blood pressure (!) 120/52, pulse 63, temperature 98.4 F (36.9 C), temperature source Oral, resp. rate 16, height _0  (1.676 m), weight 75.5 kg (166 lb 7.2 oz), SpO2 92 %. Lungs show mild inspiratory next 3 wheezes scattered rhonchi no rales appreciable heart regular rhythm no S3-S4 no heaves thrills rubs   Investigations:  No results found for this or any previous visit (from the past 240 hour(s)).   Basic Metabolic Panel:  Recent Labs  05/13/16 2030 05/14/16 0557  NA 131* 134*  K 3.7 3.8  CL 89* 92*  CO2 32 34*  GLUCOSE 165* 107*  BUN 24* 22*  CREATININE 1.31* 1.12*  CALCIUM 9.2 9.4   Liver Function Tests:  Recent Labs  05/14/16 0557  AST 20  ALT 12*  ALKPHOS 66  BILITOT 0.8  PROT 7.1  ALBUMIN 3.9     CBC:  Recent Labs  05/13/16 2030 05/14/16 0557  WBC 11.7* 9.5  NEUTROABS 7.9*  --   HGB 9.9* 9.5*  HCT 33.0* 31.2*  MCV 84.8 84.3  PLT 304 250    Dg Chest Portable 1 View  Result Date: 05/13/2016 CLINICAL DATA:  Shortness of breath. History of COPD, CHF, hypertension, diabetes, cardiac catheterization, former smoker. EXAM: PORTABLE CHEST 1 VIEW COMPARISON:  04/18/2016 FINDINGS: Cardiac enlargement. Pulmonary vascularity appears normal. Mild interstitial pattern to the lung bases may represent mild edema. Similar appearance to previous study. No focal airspace disease or consolidation. Probable small bilateral pleural effusions. No pneumothorax. Calcification of the aorta. Previous resection or resorption of the distal right clavicle. IMPRESSION: Cardiac enlargement with mild interstitial pattern to the lung bases probably representing edema. Small  effusions. Similar appearance to previous study. Electronically Signed   By: Lucienne Capers M.D.   On: 05/13/2016 20:38   Dg Hip Unilat With Pelvis 2-3 Views Right  Result Date: 05/14/2016 CLINICAL DATA:  Patient fell 1 week ago. Right hip pain since the fall. EXAM: DG HIP (WITH OR WITHOUT PELVIS) 2-3V RIGHT COMPARISON:  None. FINDINGS: No evidence of acute fracture or dislocation of the pelvis or right hip. SI joints and symphysis pubis are not displaced. Degenerative changes demonstrated in the right hip. Vascular calcifications. Surgical clips in right groin region. Postoperative changes in the lower lumbar spine. Soft tissue calcifications likely representing injection granulomas. IMPRESSION: Degenerative changes in the right hip. No acute displaced fractures identified. Electronically Signed   By: Lucienne Capers M.D.   On: 05/14/2016 02:01      Medications  Impression:  Active Problems:   Essential hypertension, benign   Diabetes (HCC)   CHF exacerbation (HCC)   S/P AKA (above knee amputation) unilateral (HCC)   CHF (congestive heart failure) (HCC)   Acute respiratory failure with hypoxia (HCC)   COPD exacerbation (HCC)   Uncontrolled type 2 diabetes mellitus with complication (HCC)   Acute diastolic heart failure (HCC)   PAF (paroxysmal atrial fibrillation) (HCC)   Acute on chronic respiratory failure with hypoxemia (HCC)     Plan: Anemia profile continue Solu-Medrol IV continue DuoNeb nebulizers IV continue Lasix IV monitor be met daily as well as magnesium  Consultants:    Procedures   Antibiotics:  Time spent:   LOS: 0 days   Embry Huss M   05/14/2016, 1:08 PM

## 2016-05-14 NOTE — Care Management Note (Signed)
Case Management Note  Patient Details  Name: [name] MRN: [id number] Date of Birth: Jul 14, 1951  Subjective/Objective: Patient is from home with husband. Recently discharged from [facility]. She has home oxygen provided by [employer]. She has a portable tank that can be brought from home to use when discharged. She has a PCP and insurance. She reports close family that is an Therapist, sports that checks on her daily.                    Action/Plan: Anticipate DC home with self care. No CM needs.    Expected Discharge Date:                  Expected Discharge Plan:  Home/Self Care  In-House Referral:  NA  Discharge planning Services  CM Consult  Post Acute Care Choice:  NA Choice offered to:  NA  DME Arranged:    DME Agency:     HH Arranged:    HH Agency:     Status of Service:  Completed, signed off  If discussed at [employer] of Stay Meetings, dates discussed:    Additional Comments:  Juda Lajeunesse, Chauncey Reading, RN 05/14/2016, 3:11 PM

## 2016-05-15 LAB — BASIC METABOLIC PANEL
Anion gap: 12 (ref 5–15)
BUN: 36 mg/dL — AB (ref 6–20)
CALCIUM: 9.8 mg/dL (ref 8.9–10.3)
CO2: 30 mmol/L (ref 22–32)
CREATININE: 1.32 mg/dL — AB (ref 0.44–1.00)
Chloride: 88 mmol/L — ABNORMAL LOW (ref 101–111)
GFR calc Af Amer: 48 mL/min — ABNORMAL LOW (ref >60)
GFR calc non Af Amer: 41 mL/min — ABNORMAL LOW (ref >60)
GLUCOSE: 399 mg/dL — AB (ref 65–99)
Potassium: 4.4 mmol/L (ref 3.5–5.1)
Sodium: 130 mmol/L — ABNORMAL LOW (ref 135–145)

## 2016-05-15 LAB — GLUCOSE, CAPILLARY
GLUCOSE-CAPILLARY: 386 mg/dL — AB (ref 65–99)
Glucose-Capillary: 400 mg/dL — ABNORMAL HIGH (ref 65–99)
Glucose-Capillary: 274 mg/dL — ABNORMAL HIGH (ref 65–99)
Glucose-Capillary: 308 mg/dL — ABNORMAL HIGH (ref 65–99)

## 2016-05-15 LAB — MRSA PCR SCREENING: MRSA by PCR: NEGATIVE

## 2016-05-15 MED ORDER — ONDANSETRON HCL 4 MG/2ML IJ SOLN
4.0000 mg | Freq: Four times a day (QID) | INTRAMUSCULAR | Status: DC | PRN
  Administered 2016-05-15: 4 mg via INTRAVENOUS
  Filled 2016-05-15: qty 2

## 2016-05-15 NOTE — Progress Notes (Signed)
Inpatient Diabetes Program Recommendations  AACE/ADA: New Consensus Statement on Inpatient Glycemic Control (2015)  Target Ranges:  Prepandial:   less than 140 mg/dL      Peak postprandial:   less than 180 mg/dL (1-2 hours)      Critically ill patients:  140 - 180 mg/dL   Lab Results  Component Value Date   GLUCAP 400 (H) 05/15/2016   HGBA1C 8.6 (H) 10/27/2015    Review of Glycemic Control Results for [name], [name] (MRN [id number]) as of 05/15/2016 09:16  Ref. Range 05/14/2016 07:47 05/14/2016 11:47 05/14/2016 16:38 05/14/2016 20:46 05/15/2016 08:10  Glucose-Capillary Latest Ref Range: 65 - 99 mg/dL 157 (H) 389 (H) 422 (H) 409 (H) 400 (H)   Outpatient Diabetes medications:  Metformin 500 bid, Januvia 100 mg, Lantus 15 units daily, 10 units Novolog tid with meals  Current orders for Inpatient glycemic control: Lantus 15 units daily, Moderate correction tid with meals.  Solu-Medrol 60 mg q 6 hours  Inpatient Diabetes Program Recommendations:  While on current steroids, consider increasing Lantus to 25 units daily and correction to resistant scale tid and add hs coverage.  Also, consider adding meal coverage.   Add CHO mod to current diet order.  Bertrand, CDE. M.Ed. Pager [phone] Inpatient Diabetes Coordinator

## 2016-05-15 NOTE — Progress Notes (Signed)
Bronchospasm significantly improved patient complains of some nausea postprandially status post cholecystectomy no right upper quadrant pain no abdominal pain elicited KYOMI FRUSH [id number] DOB: 02-19-1951 DOA: 05/13/2016 PCP: Maricela Curet, MD   Physical Exam: Blood pressure 133/67, pulse 70, temperature 97.8 F (36.6 C), temperature source Oral, resp. rate 18, height 5\' 6"  (1.676 m), weight 75.5 kg (166 lb 7.2 oz), SpO2 98 %. Lungs show prolonged phase mild index to wheeze no rhonchi no rales appreciable heart regular rhythm no S3 S1 heaves thrills rubs abdomen soft nontender bowel sounds normoactive no guarding or rebound masses no megaly   Investigations:  Recent Results (from the past 240 hour(s))  MRSA PCR Screening     Status: None   Collection Time: 05/14/16  9:35 PM  Result Value Ref Range Status   MRSA by PCR NEGATIVE NEGATIVE Final    Comment:        The GeneXpert MRSA Assay (FDA approved for NASAL specimens only), is one component of a comprehensive MRSA colonization surveillance program. It is not intended to diagnose MRSA infection nor to guide or monitor treatment for MRSA infections.      Basic Metabolic Panel:  Recent Labs  05/14/16 0557 05/15/16 0605  NA 134* 130*  K 3.8 4.4  CL 92* 88*  CO2 34* 30  GLUCOSE 107* 399*  BUN 22* 36*  CREATININE 1.12* 1.32*  CALCIUM 9.4 9.8   Liver Function Tests:  Recent Labs  05/14/16 0557  AST 20  ALT 12*  ALKPHOS 66  BILITOT 0.8  PROT 7.1  ALBUMIN 3.9     CBC:  Recent Labs  05/13/16 2030 05/14/16 0557  WBC 11.7* 9.5  NEUTROABS 7.9*  --   HGB 9.9* 9.5*  HCT 33.0* 31.2*  MCV 84.8 84.3  PLT 304 250    Dg Chest Portable 1 View  Result Date: 05/13/2016 CLINICAL DATA:  Shortness of breath. History of COPD, CHF, hypertension, diabetes, cardiac catheterization, former smoker. EXAM: PORTABLE CHEST 1 VIEW COMPARISON:  04/18/2016 FINDINGS: Cardiac enlargement. Pulmonary vascularity appears  normal. Mild interstitial pattern to the lung bases may represent mild edema. Similar appearance to previous study. No focal airspace disease or consolidation. Probable small bilateral pleural effusions. No pneumothorax. Calcification of the aorta. Previous resection or resorption of the distal right clavicle. IMPRESSION: Cardiac enlargement with mild interstitial pattern to the lung bases probably representing edema. Small effusions. Similar appearance to previous study. Electronically Signed   By: Lucienne Capers M.D.   On: 05/13/2016 20:38   Dg Hip Unilat With Pelvis 2-3 Views Right  Result Date: 05/14/2016 CLINICAL DATA:  Patient fell 1 week ago. Right hip pain since the fall. EXAM: DG HIP (WITH OR WITHOUT PELVIS) 2-3V RIGHT COMPARISON:  None. FINDINGS: No evidence of acute fracture or dislocation of the pelvis or right hip. SI joints and symphysis pubis are not displaced. Degenerative changes demonstrated in the right hip. Vascular calcifications. Surgical clips in right groin region. Postoperative changes in the lower lumbar spine. Soft tissue calcifications likely representing injection granulomas. IMPRESSION: Degenerative changes in the right hip. No acute displaced fractures identified. Electronically Signed   By: Lucienne Capers M.D.   On: 05/14/2016 02:01      Medications:   Impression:  Active Problems:   Essential hypertension, benign   Diabetes (HCC)   CHF exacerbation (HCC)   S/P AKA (above knee amputation) unilateral (HCC)   CHF (congestive heart failure) ([place])   Acute respiratory failure with hypoxia ([place])  COPD exacerbation ([place])   Uncontrolled type 2 diabetes mellitus with complication (HCC)   Acute diastolic heart failure (HCC)   PAF (paroxysmal atrial fibrillation) (HCC)   Acute on chronic respiratory failure with hypoxemia (HCC)     Plan: Push her oral steroids tomorrow consider discharge with nebulizer at home  Consultants:      Procedures   Antibiotics:            Time spent: 30 minutes   LOS: 1 day   Ruble Pumphrey M   05/15/2016, 12:58 PM

## 2016-05-15 NOTE — Care Management Important Message (Signed)
Important Message  Patient Details  Name: [name] MRN: [id number] Date of Birth: 1951/05/21   Medicare Important Message Given:  Yes    Tristian Bouska, Chauncey [place], RN 05/15/2016, 2:44 PM

## 2016-05-15 NOTE — Care Management Important Message (Signed)
Important Message  Patient Details  Name: SACOYA LICHTI MRN: [id number] Date of Birth: 06-20-1951   Medicare Important Message Given:  Yes    Malessa Zartman, Chauncey [place], RN 05/15/2016, 2:42 PM

## 2016-05-16 ENCOUNTER — Encounter (HOSPITAL_COMMUNITY): Payer: Medicare Other

## 2016-05-16 LAB — BASIC METABOLIC PANEL
ANION GAP: 11 (ref 5–15)
BUN: 37 mg/dL — ABNORMAL HIGH (ref 6–20)
CHLORIDE: 90 mmol/L — AB (ref 101–111)
CO2: 32 mmol/L (ref 22–32)
Calcium: 9.3 mg/dL (ref 8.9–10.3)
Creatinine, Ser: 1.18 mg/dL — ABNORMAL HIGH (ref 0.44–1.00)
GFR calc Af Amer: 55 mL/min — ABNORMAL LOW (ref >60)
GFR calc non Af Amer: 47 mL/min — ABNORMAL LOW (ref >60)
GLUCOSE: 366 mg/dL — AB (ref 65–99)
POTASSIUM: 4.1 mmol/L (ref 3.5–5.1)
Sodium: 133 mmol/L — ABNORMAL LOW (ref 135–145)

## 2016-05-16 LAB — GLUCOSE, CAPILLARY: Glucose-Capillary: 445 mg/dL — ABNORMAL HIGH (ref 65–99)

## 2016-05-16 MED ORDER — PREDNISONE 20 MG PO TABS: ORAL_TABLET | ORAL | 1 refills | Status: DC

## 2016-05-16 MED ORDER — FUROSEMIDE 10 MG/ML IJ SOLN: 20.0000 mg | Freq: Two times a day (BID) | INTRAMUSCULAR | 0 refills | Status: DC

## 2016-05-16 NOTE — Discharge Summary (Signed)
Physician Discharge Summary  [name] [id number] DOB: March 19, 1951 DOA: 05/13/2016  PCP: Maricela Curet, MD  Admit date: 05/13/2016 Discharge date: 05/16/2016   Recommendations for Outpatient Follow-up:  Patient is instructed to take prednisone 20 mg tablets 2 by mouth daily for 7 days then decrease to one tablet daily for the next 7 days then discontinue likewise to take her nebulizer therapy at home take all resume all of her prior to admission hospital medicines including Eliquis anti-lipidemia current and antihypertensive drugs she is to follow-up in office on Monday in 3 days' time with all of her bottles of medicines to organize her patient was recently discharged from skilled nursing facility after obtaining a stent in her LAD she likewise will need physical therapy for right AKA which is benign recent months she is essentially a vasculopath and will need aggressive care of lipids ischemic control hypertension Discharge Diagnoses:  Active Problems:   Essential hypertension, benign   Diabetes (HCC)   CHF exacerbation (HCC)   S/P AKA (above knee amputation) unilateral (HCC)   CHF (congestive heart failure) (HCC)   Acute respiratory failure with hypoxia (HCC)   COPD exacerbation (HCC)   Uncontrolled type 2 diabetes mellitus with complication (HCC)   Acute diastolic heart failure (HCC)   PAF (paroxysmal atrial fibrillation) (HCC)   Acute on chronic respiratory failure with hypoxemia [facility])   Discharge Condition: Good  Filed Weights   05/13/16 1939 05/14/16 0200  Weight: 71.7 kg (158 lb) 75.5 kg (166 lb 7.2 oz)    History of present illness: Patient 65 year old white female status post PTCA is status post stent to proximal LAD who is essentially a vasculopath had multiple carotid endarterectomies right left side of full arterial disease right AKA along with insulin minute diabetes hypertension hyperlipidemia she quit smoking approximately 18 months ago which is the good  news she has COPD exacerbation asthmatic bronchitis while in hospital chest x-ray on admission did show some volume overload per systolic function is near normal certain what's going on she is on dual minimal diuretic spironolactone and Demadex as placed on IV steroids nebulizer therapy and seemed to improve dramatically over a three-day period she was subtotally discharged on prednisone 40 a day for 7 days then decreasing to 20 mg per day for 7 days she'll follow-up my office in 3 days' time  Hospital Course:  See history of present illness above  Procedures:     Consultations:    Discharge Instructions  Discharge Instructions    Discharge instructions    Complete by:  As directed   Discharge patient    Complete by:  As directed       Medication List    STOP taking these medications   cephALEXin 500 MG capsule Commonly known as:  KEFLEX   doxycycline 100 MG tablet Commonly known as:  VIBRA-TABS     TAKE these medications   albuterol 108 (90 Base) MCG/ACT inhaler Commonly known as:  PROVENTIL HFA;VENTOLIN HFA Inhale 2 puffs into the lungs every 6 (six) hours as needed for wheezing or shortness of breath.   ALPRAZolam 0.25 MG tablet Commonly known as:  XANAX Take 1 tablet (0.25 mg total) by mouth 2 (two) times daily.   amiodarone 200 MG tablet Commonly known as:  PACERONE Take 1 tablet (200 mg total) by mouth daily. What changed:  when to take this   apixaban 5 MG Tabs tablet Commonly known as:  ELIQUIS Take 1 tablet (5 mg total) by  mouth 2 (two) times daily.   aspirin 81 MG chewable tablet Chew 1 tablet (81 mg total) by mouth daily. X 30 days only   atorvastatin 20 MG tablet Commonly known as:  LIPITOR Take 1 tablet (20 mg total) by mouth daily at 6 PM. What changed:  when to take this   citalopram 20 MG tablet Commonly known as:  CELEXA Take 20 mg by mouth every morning.   clopidogrel 75 MG tablet Commonly known as:  PLAVIX Take 1 tablet (75 mg total)  by mouth daily with breakfast. What changed:  when to take this   diltiazem 240 MG 24 hr capsule Commonly known as:  CARDIZEM CD Take 1 capsule (240 mg total) by mouth daily. What changed:  when to take this   fluticasone furoate-vilanterol 100-25 MCG/INH Aepb Commonly known as:  BREO ELLIPTA Inhale 1 puff into the lungs daily.   furosemide 10 MG/ML injection Commonly known as:  LASIX Inject 2 mLs (20 mg total) into the vein every 12 (twelve) hours.   gabapentin 300 MG capsule Commonly known as:  NEURONTIN Take 300 mg by mouth 2 (two) times daily.   insulin aspart 100 UNIT/ML injection Commonly known as:  novoLOG Inject 10 Units into the skin 3 (three) times daily before meals.   Insulin Glargine 100 UNIT/ML Solostar Pen Commonly known as:  LANTUS SOLOSTAR Inject 15 Units into the skin at bedtime.   ipratropium-albuterol 0.5-2.5 (3) MG/3ML Soln Commonly known as:  DUONEB Take 3 mLs by nebulization every 6 (six) hours as needed (wheezing).   metFORMIN 500 MG tablet Commonly known as:  GLUCOPHAGE Take 500 mg by mouth 2 (two) times daily with a meal.   oxyCODONE-acetaminophen 10-325 MG tablet Commonly known as:  PERCOCET Take 1 tablet by mouth 4 (four) times daily as needed for pain. What changed:  when to take this   pantoprazole 40 MG tablet Commonly known as:  PROTONIX Take 40 mg by mouth every morning.   predniSONE 20 MG tablet Commonly known as:  DELTASONE Take 2 tabs P.O. Days 1-7 Then take 1 tab P.O. Days 8-14   sitaGLIPtin 100 MG tablet Commonly known as:  JANUVIA Take 100 mg by mouth every morning.   spironolactone 25 MG tablet Commonly known as:  ALDACTONE Take 0.5 tablets (12.5 mg total) by mouth daily. What changed:  when to take this   torsemide 20 MG tablet Commonly known as:  DEMADEX Take 2 tablets (40 mg total) by mouth daily. What changed:  how much to take  when to take this      Allergies  Allergen Reactions  . Codeine Rash  .  Penicillins Other (See Comments)    Has patient had a PCN reaction causing immediate rash, facial/tongue/throat swelling, SOB or lightheadedness with hypotension: unknown Has patient had a PCN reaction causing severe rash involving mucus membranes or skin necrosis: unknown Has patient had a PCN reaction that required hospitalization unknown Has patient had a PCN reaction occurring within the last 10 years: unknown If all of the above answers are "NO", then may proceed with Cephalosporin use.  . Ciprofloxacin Hives      The results of significant diagnostics from this hospitalization (including imaging, microbiology, ancillary and laboratory) are listed below for reference.    Significant Diagnostic Studies: Dg Chest Portable 1 View  Result Date: 05/13/2016 CLINICAL DATA:  Shortness of breath. History of COPD, CHF, hypertension, diabetes, cardiac catheterization, former smoker. EXAM: PORTABLE CHEST 1 VIEW COMPARISON:  04/18/2016  FINDINGS: Cardiac enlargement. Pulmonary vascularity appears normal. Mild interstitial pattern to the lung bases may represent mild edema. Similar appearance to previous study. No focal airspace disease or consolidation. Probable small bilateral pleural effusions. No pneumothorax. Calcification of the aorta. Previous resection or resorption of the distal right clavicle. IMPRESSION: Cardiac enlargement with mild interstitial pattern to the lung bases probably representing edema. Small effusions. Similar appearance to previous study. Electronically Signed   By: Lucienne Capers M.D.   On: 05/13/2016 20:38   Dg Chest Port 1 View  Result Date: 04/18/2016 CLINICAL DATA:  Low-grade fever.  New onset cough. EXAM: PORTABLE CHEST 1 VIEW COMPARISON:  04/11/2016 FINDINGS: The cardiac silhouette is enlarged with stable preferential enlargement of the right atrium. Mediastinal contours appear intact. There is no evidence of pneumothorax. Decreased bilateral subpulmonic pulmonary  fusions. Persistent interstitial pulmonary edema. Osseous structures are without acute abnormality. Soft tissues are grossly normal. IMPRESSION: Enlarged cardiac silhouette with persistent interstitial pulmonary edema. Decreased in size subpulmonic pulmonary effusions. Electronically Signed   By: Fidela Salisbury M.D.   On: 04/18/2016 20:21   Dg Hip Unilat With Pelvis 2-3 Views Right  Result Date: 05/14/2016 CLINICAL DATA:  Patient fell 1 week ago. Right hip pain since the fall. EXAM: DG HIP (WITH OR WITHOUT PELVIS) 2-3V RIGHT COMPARISON:  None. FINDINGS: No evidence of acute fracture or dislocation of the pelvis or right hip. SI joints and symphysis pubis are not displaced. Degenerative changes demonstrated in the right hip. Vascular calcifications. Surgical clips in right groin region. Postoperative changes in the lower lumbar spine. Soft tissue calcifications likely representing injection granulomas. IMPRESSION: Degenerative changes in the right hip. No acute displaced fractures identified. Electronically Signed   By: Lucienne Capers M.D.   On: 05/14/2016 02:01    Microbiology: Recent Results (from the past 240 hour(s))  MRSA PCR Screening     Status: None   Collection Time: 05/14/16  9:35 PM  Result Value Ref Range Status   MRSA by PCR NEGATIVE NEGATIVE Final    Comment:        The GeneXpert MRSA Assay (FDA approved for NASAL specimens only), is one component of a comprehensive MRSA colonization surveillance program. It is not intended to diagnose MRSA infection nor to guide or monitor treatment for MRSA infections.      Labs: Basic Metabolic Panel:  Recent Labs Lab 05/13/16 2030 05/14/16 0557 05/15/16 0605 05/16/16 0548  NA 131* 134* 130* 133*  K 3.7 3.8 4.4 4.1  CL 89* 92* 88* 90*  CO2 32 34* 30 32  GLUCOSE 165* 107* 399* 366*  BUN 24* 22* 36* 37*  CREATININE 1.31* 1.12* 1.32* 1.18*  CALCIUM 9.2 9.4 9.8 9.3   Liver Function Tests:  Recent Labs Lab  05/14/16 0557  AST 20  ALT 12*  ALKPHOS 66  BILITOT 0.8  PROT 7.1  ALBUMIN 3.9   No results for input(s): LIPASE, AMYLASE in the last 168 hours. No results for input(s): AMMONIA in the last 168 hours. CBC:  Recent Labs Lab 05/13/16 2030 05/14/16 0557  WBC 11.7* 9.5  NEUTROABS 7.9*  --   HGB 9.9* 9.5*  HCT 33.0* 31.2*  MCV 84.8 84.3  PLT 304 250   Cardiac Enzymes:  Recent Labs Lab 05/13/16 2030  TROPONINI <0.03   BNP: BNP (last 3 results)  Recent Labs  03/06/16 2134 04/08/16 1531 05/13/16 2030  BNP 653.0* 920.0* 240.0*    ProBNP (last 3 results) No results for input(s): PROBNP in the  last 8760 hours.  CBG:  Recent Labs Lab 05/14/16 2046 05/15/16 0810 05/15/16 1144 05/15/16 1600 05/15/16 2020  GLUCAP 409* 400* 386* 274* 308*       Signed:  [facility] Pager: [phone] 05/16/2016, 7:08 AM

## 2016-05-16 NOTE — Progress Notes (Signed)
Reviewed discharge instructions and prescriptions with patient and family. Answered all questions. Pt transported via wheelchair with oxygen to car with family.

## 2016-05-18 DIAGNOSIS — J431 Panlobular emphysema: Secondary | ICD-10-CM | POA: Diagnosis present

## 2016-05-18 DIAGNOSIS — Z9989 Dependence on other enabling machines and devices: Secondary | ICD-10-CM

## 2016-05-18 DIAGNOSIS — E662 Morbid (severe) obesity with alveolar hypoventilation: Secondary | ICD-10-CM | POA: Diagnosis present

## 2016-05-18 DIAGNOSIS — G4733 Obstructive sleep apnea (adult) (pediatric): Secondary | ICD-10-CM | POA: Diagnosis present

## 2016-06-03 NOTE — Progress Notes (Signed)
Advanced Heart Failure Clinic Note   Primary Care: Lucia Gaskins, MD Primary Cardiologist: Dr. Acie Fredrickson Primary HF: Dr. Haroldine Laws   HPI:  [name] is a 65 y.o. 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 She has been followed by Dr. Acie Fredrickson for sinus tachycardia (versus SVT) and diastolic HF.   She presented to [facility] 7/24 with AMS, generalized edema, and SOB. Became cyanotic in her face and lips. Had mild hypercarbia (7.23 / 64 / 100), AoCKD and lactic acidosis. CT of head was unremarkable. CT ABD/Pelv showed anasarca with mild ascites and small bilateral pleural effusions.  Left foot noted to be cool to touch with no palpable or dopplerable pulses. Subsequently transferred to [facility] for further evaluation and management. Seen by [facility] who did not feel that there was any evidence of acute left limb ischemia.    Diuresed with IV lasix from 224 lbs to 182 lbs on discharge.  Pt also noted to have new onset PAF/Atrial flutter with CHA2DS2-VASc Score of 5 (7.2% per year) which continued despite Diltiazem.  Started on low dose amio to help with rate control, and started on Eliquis for anticoag.  Pt went for [facility] 04/18/16 with marginal cardiac output, moderate PAH, and 2 vessel CAD with high-grade mid LAD lesion.  Pt underwent successful PCI with stenting of the mid LAD with 0% residual stenosis.  Admitted 05/13/16 - 05/16/16 with COPD and CHF exacerbation. CXR with volume overload.  Treated with IV steroids and then prednisone taper.  Discharge weight 166 lbs.   Pt presents today for hospital follow up.  She missed initial follow up in August.  Says breathing is alright as long as she has the 02 on. Denies SOB changing clothes or bathing.  She is not SOB rolling herself in her wheelchair around the house or in a grocery store. She broke her  prosthesis several months ago, but hasn't been back up to therapy. Drinks a lot of fluid, regularly over 2 L. Avoids salt. She does have orthopnea. Though has a great appetite. Denies lightheadedness or dizziness. No CAP. Denies palpitations. Is not smoking.   R/LHC 04/18/16 - Prox RCA to Mid RCA lesion, 30 %stenosed. - Mid RCA to Dist RCA lesion, 50 %stenosed. - Dist RCA lesion, 30 %stenosed. - Ost LAD to Prox LAD lesion, 30 %stenosed. - Mid LAD lesion, 95 %stenosed.   Hemodynamic Findings: RA =  20 RV = 65/7/22 PA = 65/28 (40) PCW = 21 Fick cardiac output/index = 4.1/2.2 PVR = 4.7 WU FA sat = 92% PA sat = 50%, 48%   Past Medical History:  Diagnosis Date  . Anxiety   . Arthritis    "back" (10/30/2015)  . CHF (congestive heart failure) ([place])   . Chronic lower back pain   . COPD (chronic obstructive pulmonary disease) ([place])   . GERD (gastroesophageal reflux disease)   . Hyperlipidemia   . Hypertension   . Hypoxia   . Peripheral vascular disease ([place])   . Shingles Dec. 2016   Scalp  . Stroke ([place])   . Type II diabetes mellitus ([place])     Current Outpatient Prescriptions  Medication Sig Dispense Refill  . albuterol (PROVENTIL HFA;VENTOLIN HFA) 108 (90 Base) MCG/ACT inhaler Inhale 2 puffs into the lungs every 6 (six) hours as needed for wheezing or  shortness of breath.    . ALPRAZolam (XANAX) 0.25 MG tablet Take 1 tablet (0.25 mg total) by mouth 2 (two) times daily. 30 tablet 0  . amiodarone (PACERONE) 200 MG tablet Take 1 tablet (200 mg total) by mouth daily. 30 tablet 1  . apixaban (ELIQUIS) 5 MG TABS tablet Take 1 tablet (5 mg total) by mouth 2 (two) times daily. 60 tablet 2  . atorvastatin (LIPITOR) 20 MG tablet Take 20 mg by mouth daily.    . citalopram (CELEXA) 20 MG tablet Take 20 mg by mouth every morning.     . clopidogrel (PLAVIX) 75 MG tablet Take 1 tablet (75 mg total) by mouth daily with breakfast. 30 tablet 12  . diltiazem (CARDIZEM CD) 240 MG 24 hr capsule Take  1 capsule (240 mg total) by mouth daily. 39 capsule 1  . fluticasone furoate-vilanterol (BREO ELLIPTA) 100-25 MCG/INH AEPB Inhale 1 puff into the lungs daily. 1 each 5  . furosemide (LASIX) 20 MG tablet Take 20 mg by mouth 2 (two) times daily.    Marland Kitchen gabapentin (NEURONTIN) 300 MG capsule Take 300 mg by mouth 2 (two) times daily.     . insulin aspart (NOVOLOG) 100 UNIT/ML injection Inject 10 Units into the skin 3 (three) times daily before meals.     . Insulin Glargine (LANTUS SOLOSTAR) 100 UNIT/ML Solostar Pen Inject 25 Units into the skin daily at 10 pm.    . ipratropium-albuterol (DUONEB) 0.5-2.5 (3) MG/3ML SOLN Take 3 mLs by nebulization every 6 (six) hours as needed (wheezing).     . metFORMIN (GLUCOPHAGE) 500 MG tablet Take 500 mg by mouth 2 (two) times daily with a meal.    . oxyCODONE-acetaminophen (PERCOCET) 10-325 MG tablet Take 1 tablet by mouth every 4 (four) hours as needed for pain.    . pantoprazole (PROTONIX) 40 MG tablet Take 40 mg by mouth every morning.     . sitaGLIPtin (JANUVIA) 100 MG tablet Take 100 mg by mouth every morning.    Marland Kitchen spironolactone (ALDACTONE) 25 MG tablet Take 0.5 tablets (12.5 mg total) by mouth daily. 30 tablet 0   No current facility-administered medications for this encounter.     Allergies  Allergen Reactions  . Codeine Rash  . Penicillins Other (See Comments)    Has patient had a PCN reaction causing immediate rash, facial/tongue/throat swelling, SOB or lightheadedness with hypotension: unknown Has patient had a PCN reaction causing severe rash involving mucus membranes or skin necrosis: unknown Has patient had a PCN reaction that required hospitalization unknown Has patient had a PCN reaction occurring within the last 10 years: unknown If all of the above answers are "NO", then may proceed with Cephalosporin use.  . Ciprofloxacin Hives      Social History   Social History  . Marital status: Married    Spouse name: N/A  . Number of children:  N/A  . Years of education: N/A   Occupational History  . Not on file.   Social History Main Topics  . Smoking status: Former Smoker    Packs/day: 0.12    Years: 49.00    Types: Cigarettes  . Smokeless tobacco: Never Used  . Alcohol use No  . Drug use: No  . Sexual activity: No   Other Topics Concern  . Not on file   Social History Narrative  . No narrative on file      Family History  Problem Relation Age of Onset  . Cancer Father   .  Heart attack Sister   . Stroke Sister   . Hypertension Sister   . Hypertension Brother     Vitals:   06/04/16 1404  BP: 135/61  Pulse: 66  Resp: 18  SpO2: 97%  Weight: 171 lb (77.6 kg)    PHYSICAL EXAM: General:  Elderly appearing HEENT: normal Neck: supple. JVD difficult to assess. Carotids 2+ bilat; no bruits. No thyromegaly or nodule noted.  Cor: PMI nondisplaced. Irregularly irregular. No M/G/R appreciated Lungs: Diminished throughout.  Abdomen: NT, mild/mod distention, no HSM. No bruits or masses. +BS  Extremities: no cyanosis, clubbing, rash. S/p R BKA. L leg with 1-2+ edema.   Neuro: alert & oriented x 3, cranial nerves grossly intact. moves all 4 extremities w/o difficulty. Affect pleasant.  ECG: Afib 72 bpm  ASSESSMENT & PLAN:   1. Chronic diastolic HF with R>L symptoms and pulmonary and HTN on Echo 2. Chronic respiratory failure 3. PAF/Atrial Flutter - CHA2DS2-VASc Score of at least 5 4. PAD s/p R BKA 5. Abnormal Echo with anteroseptal/inferoseptal HK, RV HK, and pulmonary HTN 6. Severe COPD 7. DM2  EKG today shows Afib 72 bpm.  She has been on Eliquis for nearly 2 months without missing a dose.   Will schedule for DCCV with Dr. Haroldine Laws.   She has some volume overload on exam with peripheral edema and abdominal distention. She was discharged on torsemide. It is unclear when she ended up back on lasix, but this was confirmed x 2 by her pharmacy today. Will stop lasix. Switch to torsemide 40 mg daily.  BMET  today and repeat next week.   BMET/BNP today. Repeat BMET next week in [place].   Recommend she restarts gait training/rehab.   Follow up 2 weeks for s/p DCCV and volume status assessment.   Shirley Friar, PA-C 06/04/16     Total time spent > 25 minutes. Over half this time spent discussing the above.

## 2016-06-04 ENCOUNTER — Encounter (HOSPITAL_COMMUNITY): Payer: Self-pay

## 2016-06-04 ENCOUNTER — Ambulatory Visit (HOSPITAL_COMMUNITY)
Admission: RE | Admit: 2016-06-04 | Discharge: 2016-06-04 | Disposition: A | Discharge status: Home / Self Care | Payer: Medicare Other | Source: Ambulatory Visit | Attending: Cardiology | Admitting: Cardiology

## 2016-06-04 VITALS — BP 135/61 | HR 66 | Resp 18 | Wt 171.0 lb

## 2016-06-04 DIAGNOSIS — J9610 Chronic respiratory failure, unspecified whether with hypoxia or hypercapnia: Secondary | ICD-10-CM | POA: Insufficient documentation

## 2016-06-04 DIAGNOSIS — I480 Paroxysmal atrial fibrillation: Secondary | ICD-10-CM | POA: Diagnosis not present

## 2016-06-04 DIAGNOSIS — I110 Hypertensive heart disease with heart failure: Secondary | ICD-10-CM | POA: Diagnosis present

## 2016-06-04 DIAGNOSIS — K219 Gastro-esophageal reflux disease without esophagitis: Secondary | ICD-10-CM | POA: Diagnosis not present

## 2016-06-04 DIAGNOSIS — E119 Type 2 diabetes mellitus without complications: Secondary | ICD-10-CM | POA: Insufficient documentation

## 2016-06-04 DIAGNOSIS — Z880 Allergy status to penicillin: Secondary | ICD-10-CM | POA: Diagnosis not present

## 2016-06-04 DIAGNOSIS — I4891 Unspecified atrial fibrillation: Secondary | ICD-10-CM | POA: Insufficient documentation

## 2016-06-04 DIAGNOSIS — G4733 Obstructive sleep apnea (adult) (pediatric): Secondary | ICD-10-CM

## 2016-06-04 DIAGNOSIS — G8929 Other chronic pain: Secondary | ICD-10-CM

## 2016-06-04 DIAGNOSIS — Z79899 Other long term (current) drug therapy: Secondary | ICD-10-CM | POA: Diagnosis not present

## 2016-06-04 DIAGNOSIS — I5032 Chronic diastolic (congestive) heart failure: Secondary | ICD-10-CM

## 2016-06-04 DIAGNOSIS — I739 Peripheral vascular disease, unspecified: Secondary | ICD-10-CM

## 2016-06-04 DIAGNOSIS — E785 Hyperlipidemia, unspecified: Secondary | ICD-10-CM | POA: Insufficient documentation

## 2016-06-04 DIAGNOSIS — J449 Chronic obstructive pulmonary disease, unspecified: Secondary | ICD-10-CM

## 2016-06-04 DIAGNOSIS — I2510 Atherosclerotic heart disease of native coronary artery without angina pectoris: Secondary | ICD-10-CM

## 2016-06-04 DIAGNOSIS — Z794 Long term (current) use of insulin: Secondary | ICD-10-CM | POA: Diagnosis not present

## 2016-06-04 DIAGNOSIS — M545 Low back pain: Secondary | ICD-10-CM

## 2016-06-04 DIAGNOSIS — Z7901 Long term (current) use of anticoagulants: Secondary | ICD-10-CM | POA: Diagnosis not present

## 2016-06-04 DIAGNOSIS — I4892 Unspecified atrial flutter: Secondary | ICD-10-CM | POA: Insufficient documentation

## 2016-06-04 DIAGNOSIS — Z87891 Personal history of nicotine dependence: Secondary | ICD-10-CM | POA: Diagnosis not present

## 2016-06-04 DIAGNOSIS — Z9989 Dependence on other enabling machines and devices: Secondary | ICD-10-CM

## 2016-06-04 MED ORDER — TORSEMIDE 20 MG PO TABS: 40.0000 mg | ORAL_TABLET | Freq: Every day | ORAL | 3 refills | Status: AC

## 2016-06-04 NOTE — Patient Instructions (Addendum)
STOP Lasix.  START Torsemide 40 mg once daily.  You have been scheduled for a cardioversion next week. See intruction sheet for additional details.  Have labs drawn next week.  Follow up 2 weeks with Oda Kilts PA-C.  Do the following things EVERYDAY: 1) Weigh yourself in the morning before breakfast. Write it down and keep it in a log. 2) Take your medicines as prescribed 3) Eat low salt foods-Limit salt (sodium) to 2000 mg per day.  4) Stay as active as you can everyday 5) Limit all fluids for the day to less than 2 liters

## 2016-06-04 NOTE — Progress Notes (Signed)
Advanced Heart Failure Medication Review by a Pharmacist  Does the patient  feel that his/her medications are working for him/her?  yes  Has the patient been experiencing any side effects to the medications prescribed?  no  Does the patient measure his/her own blood pressure or blood glucose at home?  yes   Does the patient have any problems obtaining medications due to transportation or finances?   no  Understanding of regimen: fair Understanding of indications: fair Potential of compliance: good Patient understands to avoid NSAIDs. Patient understands to avoid decongestants.  Issues to address at subsequent visits: None   Pharmacist comments:  [name] is a pleasant 64 yo F presenting with her family friend who helps to manage her medications. They report good compliance with her regimen but were confused about her furosemide dose. I called [employer] and they stated that she has been filling furosemide 40 mg daily. No other medication-related questions or concerns for me at this time.   Ruta Hinds. Velva Harman, PharmD, BCPS, CPP Clinical Pharmacist Pager: [phone] Phone: [phone] 06/04/2016 3:16 PM      Time with patient: 10 minutes Preparation and documentation time: 6 minutes Total time: 16 minutes

## 2016-06-07 ENCOUNTER — Other Ambulatory Visit (HOSPITAL_COMMUNITY): Payer: Self-pay

## 2016-06-07 ENCOUNTER — Telehealth (HOSPITAL_COMMUNITY): Payer: Self-pay | Admitting: *Deleted

## 2016-06-07 DIAGNOSIS — I480 Paroxysmal atrial fibrillation: Secondary | ICD-10-CM

## 2016-06-07 NOTE — Progress Notes (Signed)
PreDCCV orders placed in epic for pending release  Leory Plowman, Guinevere Ferrari, RN

## 2016-06-07 NOTE — Telephone Encounter (Signed)
Pt's daughter in law Constance Holster called concerned about pt's swelling, she reports pt unable to wt so not sure what wt is but pt seems more swollen all over than she did at appt this week.  Pt was supposed to have labs done this week but where not done, she states pt did go to [place] lab today for labs, I attempted to call them but results are not available yet.  Discussed w/Dr Bensimhon he recommends pt increase Torsemide to 40 mg Twice daily over the weekend.  Constance Holster is aware and agreeable, she will c/b on Mon w/update on pt

## 2016-06-12 ENCOUNTER — Other Ambulatory Visit (HOSPITAL_COMMUNITY): Payer: Self-pay | Admitting: *Deleted

## 2016-06-12 MED ORDER — APIXABAN 5 MG PO TABS: 5.0000 mg | ORAL_TABLET | Freq: Two times a day (BID) | ORAL | 2 refills | Status: AC

## 2016-06-14 ENCOUNTER — Ambulatory Visit (HOSPITAL_COMMUNITY): Payer: Medicare Other | Admitting: Certified Registered Nurse Anesthetist

## 2016-06-14 ENCOUNTER — Encounter (HOSPITAL_COMMUNITY): Payer: Self-pay | Admitting: *Deleted

## 2016-06-14 ENCOUNTER — Encounter (HOSPITAL_COMMUNITY)
Admission: RE | Disposition: A | Discharge status: Home / Self Care | Payer: Self-pay | Source: Ambulatory Visit | Attending: Internal Medicine

## 2016-06-14 ENCOUNTER — Ambulatory Visit (HOSPITAL_COMMUNITY)
Admission: RE | Admit: 2016-06-14 | Discharge: 2016-06-14 | Disposition: A | Discharge status: Home / Self Care | Payer: Medicare Other | Source: Ambulatory Visit | Attending: Internal Medicine | Admitting: Internal Medicine

## 2016-06-14 DIAGNOSIS — Z7984 Long term (current) use of oral hypoglycemic drugs: Secondary | ICD-10-CM | POA: Insufficient documentation

## 2016-06-14 DIAGNOSIS — M1990 Unspecified osteoarthritis, unspecified site: Secondary | ICD-10-CM | POA: Diagnosis not present

## 2016-06-14 DIAGNOSIS — J449 Chronic obstructive pulmonary disease, unspecified: Secondary | ICD-10-CM | POA: Diagnosis not present

## 2016-06-14 DIAGNOSIS — J9610 Chronic respiratory failure, unspecified whether with hypoxia or hypercapnia: Secondary | ICD-10-CM | POA: Diagnosis not present

## 2016-06-14 DIAGNOSIS — E785 Hyperlipidemia, unspecified: Secondary | ICD-10-CM | POA: Diagnosis not present

## 2016-06-14 DIAGNOSIS — Z79899 Other long term (current) drug therapy: Secondary | ICD-10-CM | POA: Insufficient documentation

## 2016-06-14 DIAGNOSIS — I480 Paroxysmal atrial fibrillation: Secondary | ICD-10-CM | POA: Diagnosis present

## 2016-06-14 DIAGNOSIS — I4892 Unspecified atrial flutter: Secondary | ICD-10-CM | POA: Diagnosis not present

## 2016-06-14 DIAGNOSIS — Z794 Long term (current) use of insulin: Secondary | ICD-10-CM | POA: Insufficient documentation

## 2016-06-14 DIAGNOSIS — I110 Hypertensive heart disease with heart failure: Secondary | ICD-10-CM | POA: Diagnosis not present

## 2016-06-14 DIAGNOSIS — K219 Gastro-esophageal reflux disease without esophagitis: Secondary | ICD-10-CM | POA: Insufficient documentation

## 2016-06-14 DIAGNOSIS — E1151 Type 2 diabetes mellitus with diabetic peripheral angiopathy without gangrene: Secondary | ICD-10-CM | POA: Diagnosis not present

## 2016-06-14 DIAGNOSIS — Z880 Allergy status to penicillin: Secondary | ICD-10-CM | POA: Insufficient documentation

## 2016-06-14 DIAGNOSIS — Z8673 Personal history of transient ischemic attack (TIA), and cerebral infarction without residual deficits: Secondary | ICD-10-CM | POA: Insufficient documentation

## 2016-06-14 DIAGNOSIS — Z87891 Personal history of nicotine dependence: Secondary | ICD-10-CM | POA: Insufficient documentation

## 2016-06-14 DIAGNOSIS — F419 Anxiety disorder, unspecified: Secondary | ICD-10-CM | POA: Diagnosis not present

## 2016-06-14 DIAGNOSIS — Z7901 Long term (current) use of anticoagulants: Secondary | ICD-10-CM | POA: Insufficient documentation

## 2016-06-14 DIAGNOSIS — I5032 Chronic diastolic (congestive) heart failure: Secondary | ICD-10-CM | POA: Insufficient documentation

## 2016-06-14 HISTORY — PX: CARDIOVERSION: SHX1299

## 2016-06-14 LAB — POCT I-STAT, CHEM 8
BUN: 18 mg/dL (ref 6–20)
CREATININE: 1.3 mg/dL — AB (ref 0.44–1.00)
Calcium, Ion: 1.17 mmol/L (ref 1.15–1.40)
Chloride: 93 mmol/L — ABNORMAL LOW (ref 101–111)
Glucose, Bld: 100 mg/dL — ABNORMAL HIGH (ref 65–99)
HEMATOCRIT: 31 % — AB (ref 36.0–46.0)
Hemoglobin: 10.5 g/dL — ABNORMAL LOW (ref 12.0–15.0)
POTASSIUM: 4 mmol/L (ref 3.5–5.1)
Sodium: 136 mmol/L (ref 135–145)
TCO2: 35 mmol/L (ref 0–100)

## 2016-06-14 SURGERY — CARDIOVERSION
Anesthesia: Monitor Anesthesia Care

## 2016-06-14 MED ORDER — LIDOCAINE HCL (CARDIAC) 20 MG/ML IV SOLN
INTRAVENOUS | Status: DC | PRN
  Administered 2016-06-14: 20 mg via INTRAVENOUS

## 2016-06-14 MED ORDER — PROPOFOL 10 MG/ML IV BOLUS
INTRAVENOUS | Status: DC | PRN
  Administered 2016-06-14: 90 mg via INTRAVENOUS

## 2016-06-14 MED ORDER — SODIUM CHLORIDE 0.9 % IV SOLN
INTRAVENOUS | Status: DC
  Administered 2016-06-14 (×2): via INTRAVENOUS

## 2016-06-14 NOTE — Interval H&P Note (Signed)
History and Physical Interval Note:  06/14/2016 9:00 AM  [name]  has presented today for surgery, with the diagnosis of afib  The various methods of treatment have been discussed with the patient and family. After consideration of risks, benefits and other options for treatment, the patient has consented to  Procedure(s): CARDIOVERSION (N/A) as a surgical intervention .  The patient's history has been reviewed, patient examined, no change in status, stable for surgery.  I have reviewed the patient's chart and labs.  Questions were answered to the patient's satisfaction.     Bensimhon, Quillian Quince

## 2016-06-14 NOTE — H&P (View-Only) (Signed)
Advanced Heart Failure Clinic Note   Primary Care: Lucia Gaskins, MD Primary Cardiologist: Dr. Acie Fredrickson Primary HF: Dr. Haroldine Laws   HPI:  [name] is a 65 y.o. 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 She has been followed by Dr. Acie Fredrickson for sinus tachycardia (versus SVT) and diastolic HF.   She presented to [facility] 7/24 with AMS, generalized edema, and SOB. Became cyanotic in her face and lips. Had mild hypercarbia (7.23 / 64 / 100), AoCKD and lactic acidosis. CT of head was unremarkable. CT ABD/Pelv showed anasarca with mild ascites and small bilateral pleural effusions.  Left foot noted to be cool to touch with no palpable or dopplerable pulses. Subsequently transferred to [facility] for further evaluation and management. Seen by VVS who did not feel that there was any evidence of acute left limb ischemia.    Diuresed with IV lasix from 224 lbs to 182 lbs on discharge.  Pt also noted to have new onset PAF/Atrial flutter with CHA2DS2-VASc Score of 5 (7.2% per year) which continued despite Diltiazem.  Started on low dose amio to help with rate control, and started on Eliquis for anticoag.  Pt went for [facility] 04/18/16 with marginal cardiac output, moderate PAH, and 2 vessel CAD with high-grade mid LAD lesion.  Pt underwent successful PCI with stenting of the mid LAD with 0% residual stenosis.  Admitted 05/13/16 - 05/16/16 with COPD and CHF exacerbation. CXR with volume overload.  Treated with IV steroids and then prednisone taper.  Discharge weight 166 lbs.   Pt presents today for hospital follow up.  She missed initial follow up in August.  Says breathing is alright as long as she has the 02 on. Denies SOB changing clothes or bathing.  She is not SOB rolling herself in her wheelchair around the house or in a grocery store. She broke her  prosthesis several months ago, but hasn't been back up to therapy. Drinks a lot of fluid, regularly over 2 L. Avoids salt. She does have orthopnea. Though has a great appetite. Denies lightheadedness or dizziness. No CAP. Denies palpitations. Is not smoking.   R/LHC 04/18/16 - Prox RCA to Mid RCA lesion, 30 %stenosed. - Mid RCA to Dist RCA lesion, 50 %stenosed. - Dist RCA lesion, 30 %stenosed. - Ost LAD to Prox LAD lesion, 30 %stenosed. - Mid LAD lesion, 95 %stenosed.   Hemodynamic Findings: RA =  20 RV = 65/7/22 PA = 65/28 (40) PCW = 21 Fick cardiac output/index = 4.1/2.2 PVR = 4.7 WU FA sat = 92% PA sat = 50%, 48%   Past Medical History:  Diagnosis Date  . Anxiety   . Arthritis    "back" (10/30/2015)  . CHF (congestive heart failure) ([place])   . Chronic lower back pain   . COPD (chronic obstructive pulmonary disease) ([place])   . GERD (gastroesophageal reflux disease)   . Hyperlipidemia   . Hypertension   . Hypoxia   . Peripheral vascular disease ([place])   . Shingles Dec. 2016   Scalp  . Stroke ([place])   . Type II diabetes mellitus ([place])     Current Outpatient Prescriptions  Medication Sig Dispense Refill  . albuterol (PROVENTIL HFA;VENTOLIN HFA) 108 (90 Base) MCG/ACT inhaler Inhale 2 puffs into the lungs every 6 (six) hours as needed for wheezing or  shortness of breath.    . ALPRAZolam (XANAX) 0.25 MG tablet Take 1 tablet (0.25 mg total) by mouth 2 (two) times daily. 30 tablet 0  . amiodarone (PACERONE) 200 MG tablet Take 1 tablet (200 mg total) by mouth daily. 30 tablet 1  . apixaban (ELIQUIS) 5 MG TABS tablet Take 1 tablet (5 mg total) by mouth 2 (two) times daily. 60 tablet 2  . atorvastatin (LIPITOR) 20 MG tablet Take 20 mg by mouth daily.    . citalopram (CELEXA) 20 MG tablet Take 20 mg by mouth every morning.     . clopidogrel (PLAVIX) 75 MG tablet Take 1 tablet (75 mg total) by mouth daily with breakfast. 30 tablet 12  . diltiazem (CARDIZEM CD) 240 MG 24 hr capsule Take  1 capsule (240 mg total) by mouth daily. 39 capsule 1  . fluticasone furoate-vilanterol (BREO ELLIPTA) 100-25 MCG/INH AEPB Inhale 1 puff into the lungs daily. 1 each 5  . furosemide (LASIX) 20 MG tablet Take 20 mg by mouth 2 (two) times daily.    Marland Kitchen gabapentin (NEURONTIN) 300 MG capsule Take 300 mg by mouth 2 (two) times daily.     . insulin aspart (NOVOLOG) 100 UNIT/ML injection Inject 10 Units into the skin 3 (three) times daily before meals.     . Insulin Glargine (LANTUS SOLOSTAR) 100 UNIT/ML Solostar Pen Inject 25 Units into the skin daily at 10 pm.    . ipratropium-albuterol (DUONEB) 0.5-2.5 (3) MG/3ML SOLN Take 3 mLs by nebulization every 6 (six) hours as needed (wheezing).     . metFORMIN (GLUCOPHAGE) 500 MG tablet Take 500 mg by mouth 2 (two) times daily with a meal.    . oxyCODONE-acetaminophen (PERCOCET) 10-325 MG tablet Take 1 tablet by mouth every 4 (four) hours as needed for pain.    . pantoprazole (PROTONIX) 40 MG tablet Take 40 mg by mouth every morning.     . sitaGLIPtin (JANUVIA) 100 MG tablet Take 100 mg by mouth every morning.    Marland Kitchen spironolactone (ALDACTONE) 25 MG tablet Take 0.5 tablets (12.5 mg total) by mouth daily. 30 tablet 0   No current facility-administered medications for this encounter.     Allergies  Allergen Reactions  . Codeine Rash  . Penicillins Other (See Comments)    Has patient had a PCN reaction causing immediate rash, facial/tongue/throat swelling, SOB or lightheadedness with hypotension: unknown Has patient had a PCN reaction causing severe rash involving mucus membranes or skin necrosis: unknown Has patient had a PCN reaction that required hospitalization unknown Has patient had a PCN reaction occurring within the last 10 years: unknown If all of the above answers are "NO", then may proceed with Cephalosporin use.  . Ciprofloxacin Hives      Social History   Social History  . Marital status: Married    Spouse name: N/A  . Number of children:  N/A  . Years of education: N/A   Occupational History  . Not on file.   Social History Main Topics  . Smoking status: Former Smoker    Packs/day: 0.12    Years: 49.00    Types: Cigarettes  . Smokeless tobacco: Never Used  . Alcohol use No  . Drug use: No  . Sexual activity: No   Other Topics Concern  . Not on file   Social History Narrative  . No narrative on file      Family History  Problem Relation Age of Onset  . Cancer Father   .  Heart attack Sister   . Stroke Sister   . Hypertension Sister   . Hypertension Brother     Vitals:   06/04/16 1404  BP: 135/61  Pulse: 66  Resp: 18  SpO2: 97%  Weight: 171 lb (77.6 kg)    PHYSICAL EXAM: General:  Elderly appearing HEENT: normal Neck: supple. JVD difficult to assess. Carotids 2+ bilat; no bruits. No thyromegaly or nodule noted.  Cor: PMI nondisplaced. Irregularly irregular. No M/G/R appreciated Lungs: Diminished throughout.  Abdomen: NT, mild/mod distention, no HSM. No bruits or masses. +BS  Extremities: no cyanosis, clubbing, rash. S/p R BKA. L leg with 1-2+ edema.   Neuro: alert & oriented x 3, cranial nerves grossly intact. moves all 4 extremities w/o difficulty. Affect pleasant.  ECG: Afib 72 bpm  ASSESSMENT & PLAN:   1. Chronic diastolic HF with R>L symptoms and pulmonary and HTN on Echo 2. Chronic respiratory failure 3. PAF/Atrial Flutter - CHA2DS2-VASc Score of at least 5 4. PAD s/p R BKA 5. Abnormal Echo with anteroseptal/inferoseptal HK, RV HK, and pulmonary HTN 6. Severe COPD 7. DM2  EKG today shows Afib 72 bpm.  She has been on Eliquis for nearly 2 months without missing a dose.   Will schedule for DCCV with Dr. Haroldine Laws.   She has some volume overload on exam with peripheral edema and abdominal distention. She was discharged on torsemide. It is unclear when she ended up back on lasix, but this was confirmed x 2 by her pharmacy today. Will stop lasix. Switch to torsemide 40 mg daily.  BMET  today and repeat next week.   BMET/BNP today. Repeat BMET next week in [address].   Recommend she restarts gait training/rehab.   Follow up 2 weeks for s/p DCCV and volume status assessment.   Shirley Friar, PA-C 06/04/16     Total time spent > 25 minutes. Over half this time spent discussing the above.

## 2016-06-14 NOTE — Discharge Instructions (Signed)

## 2016-06-14 NOTE — CV Procedure (Signed)
      DIRECT CURRENT CARDIOVERSION  NAME:  [name]   MRN: [id number] DOB:  11/12/1950   ADMIT DATE: 06/14/2016   INDICATIONS: Atrial fibrillation    PROCEDURE:   Informed consent was obtained prior to the procedure. The risks, benefits and alternatives for the procedure were discussed and the patient comprehended these risks. Once an appropriate time out was taken, the patient had the defibrillator pads placed in the anterior and posterior position. The patient then underwent sedation by the anesthesia service. Once an appropriate level of sedation was achieved, the patient received a single biphasic, synchronized 200J shock with prompt conversion to sinus rhythm. No apparent complications.   Apphia Cropley,MD 9:33 AM

## 2016-06-14 NOTE — Anesthesia Preprocedure Evaluation (Addendum)
Anesthesia Evaluation  Patient identified by MRN, date of birth, ID band Patient awake    Reviewed: Allergy & Precautions, NPO status , Patient's Chart, lab work & pertinent test results  Airway Mallampati: II       Dental  (+) Edentulous Upper, Edentulous Lower   Pulmonary former smoker,    breath sounds clear to auscultation       Cardiovascular hypertension,  Rhythm:Irregular Rate:Normal     Neuro/Psych    GI/Hepatic   Endo/Other  diabetes  Renal/GU      Musculoskeletal   Abdominal   Peds  Hematology   Anesthesia Other Findings   Reproductive/Obstetrics                             Anesthesia Physical Anesthesia Plan  ASA: III  Anesthesia Plan: General   Post-op Pain Management:    Induction: Intravenous  Airway Management Planned: Mask  Additional Equipment:   Intra-op Plan:   Post-operative Plan:   Informed Consent: I have reviewed the patients History and Physical, chart, labs and discussed the procedure including the risks, benefits and alternatives for the proposed anesthesia with the patient or authorized representative who has indicated his/her understanding and acceptance.     Plan Discussed with: CRNA and Anesthesiologist  Anesthesia Plan Comments:         Anesthesia Quick Evaluation

## 2016-06-14 NOTE — Transfer of Care (Signed)
Immediate Anesthesia Transfer of Care Note  Patient: [name]  Procedure(s) Performed: Procedure(s): CARDIOVERSION (N/A)  Patient Location: Endoscopy Unit  Anesthesia Type:General  Level of Consciousness: awake, alert , oriented and patient cooperative  Airway & Oxygen Therapy: Patient Spontanous Breathing and Patient connected to nasal cannula oxygen  Post-op Assessment: Report given to RN and Post -op Vital signs reviewed and stable  Post vital signs: Reviewed and stable  Last Vitals:  Vitals:   06/14/16 0730  BP: (!) 130/57  Pulse: 68  Resp: 12  Temp: 37.1 C    Last Pain:  Vitals:   06/14/16 0730  PainSc: 7          Complications: No apparent anesthesia complications

## 2016-06-15 NOTE — Anesthesia Postprocedure Evaluation (Signed)
Anesthesia Post Note  Patient: [name]  Procedure(s) Performed: Procedure(s) (LRB): CARDIOVERSION (N/A)  Patient location during evaluation: Endoscopy Anesthesia Type: General Level of consciousness: awake, awake and alert and oriented Pain management: pain level controlled Respiratory status: spontaneous breathing, nonlabored ventilation, respiratory function stable and patient connected to nasal cannula oxygen Cardiovascular status: blood pressure returned to baseline Anesthetic complications: no    Last Vitals:  Vitals:   06/14/16 0925 06/14/16 0935  BP: (!) 116/59 114/75  Pulse: 81 80  Resp: 13 14  Temp:      Last Pain:  Vitals:   06/14/16 0922  TempSrc: Oral  PainSc:                  Anihya Tuma COKER

## 2016-06-18 ENCOUNTER — Ambulatory Visit (HOSPITAL_COMMUNITY)
Admission: RE | Admit: 2016-06-18 | Discharge: 2016-06-18 | Disposition: A | Discharge status: Home / Self Care | Payer: Medicare Other | Source: Ambulatory Visit | Attending: Cardiology | Admitting: Cardiology

## 2016-06-18 VITALS — BP 120/64 | HR 57 | Wt 169.4 lb

## 2016-06-18 DIAGNOSIS — Z79899 Other long term (current) drug therapy: Secondary | ICD-10-CM | POA: Diagnosis not present

## 2016-06-18 DIAGNOSIS — E785 Hyperlipidemia, unspecified: Secondary | ICD-10-CM | POA: Diagnosis not present

## 2016-06-18 DIAGNOSIS — Z87891 Personal history of nicotine dependence: Secondary | ICD-10-CM | POA: Insufficient documentation

## 2016-06-18 DIAGNOSIS — I2510 Atherosclerotic heart disease of native coronary artery without angina pectoris: Secondary | ICD-10-CM | POA: Diagnosis not present

## 2016-06-18 DIAGNOSIS — I739 Peripheral vascular disease, unspecified: Secondary | ICD-10-CM | POA: Diagnosis not present

## 2016-06-18 DIAGNOSIS — Z794 Long term (current) use of insulin: Secondary | ICD-10-CM | POA: Diagnosis not present

## 2016-06-18 DIAGNOSIS — I5032 Chronic diastolic (congestive) heart failure: Secondary | ICD-10-CM

## 2016-06-18 DIAGNOSIS — Z9989 Dependence on other enabling machines and devices: Secondary | ICD-10-CM

## 2016-06-18 DIAGNOSIS — I272 Pulmonary hypertension, unspecified: Secondary | ICD-10-CM | POA: Diagnosis not present

## 2016-06-18 DIAGNOSIS — E119 Type 2 diabetes mellitus without complications: Secondary | ICD-10-CM | POA: Diagnosis not present

## 2016-06-18 DIAGNOSIS — I110 Hypertensive heart disease with heart failure: Secondary | ICD-10-CM | POA: Insufficient documentation

## 2016-06-18 DIAGNOSIS — G4733 Obstructive sleep apnea (adult) (pediatric): Secondary | ICD-10-CM

## 2016-06-18 DIAGNOSIS — Z7901 Long term (current) use of anticoagulants: Secondary | ICD-10-CM | POA: Insufficient documentation

## 2016-06-18 DIAGNOSIS — J449 Chronic obstructive pulmonary disease, unspecified: Secondary | ICD-10-CM | POA: Insufficient documentation

## 2016-06-18 DIAGNOSIS — I481 Persistent atrial fibrillation: Secondary | ICD-10-CM | POA: Diagnosis not present

## 2016-06-18 DIAGNOSIS — I480 Paroxysmal atrial fibrillation: Secondary | ICD-10-CM

## 2016-06-18 DIAGNOSIS — Z880 Allergy status to penicillin: Secondary | ICD-10-CM | POA: Insufficient documentation

## 2016-06-18 DIAGNOSIS — J9610 Chronic respiratory failure, unspecified whether with hypoxia or hypercapnia: Secondary | ICD-10-CM | POA: Diagnosis not present

## 2016-06-18 DIAGNOSIS — Z89511 Acquired absence of right leg below knee: Secondary | ICD-10-CM | POA: Diagnosis not present

## 2016-06-18 DIAGNOSIS — G8929 Other chronic pain: Secondary | ICD-10-CM | POA: Insufficient documentation

## 2016-06-18 NOTE — Patient Instructions (Signed)
No changes in medication.  No lab work. Please have your PCP forward results of labs to our office. Fax: [phone]  Follow up 4 weeks with [name], Certified 52 Assistant.  Do the following things EVERYDAY: 1) Weigh yourself in the morning before breakfast. Write it down and keep it in a log. 2) Take your medicines as prescribed 3) Eat low salt foods-Limit salt (sodium) to 2000 mg per day.  4) Stay as active as you can everyday 5) Limit all fluids for the day to less than 2 liters

## 2016-06-18 NOTE — Progress Notes (Signed)
Advanced Heart Failure Clinic Note   Primary Care: Lucia Gaskins, MD Primary Cardiologist: Dr. Acie Fredrickson Primary HF: Dr. Haroldine Laws   HPI:  [name] is a 65 y.o. 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 She has been followed by Dr. Acie Fredrickson for sinus tachycardia (versus SVT) and diastolic HF.   She presented to [facility] ED 7/24 with AMS, generalized edema, and SOB. Became cyanotic in her face and lips. Had mild hypercarbia (7.23 / 64 / 100), AoCKD and lactic acidosis. CT of head was unremarkable. CT ABD/Pelv showed anasarca with mild ascites and small bilateral pleural effusions.  Left foot noted to be cool to touch with no palpable or dopplerable pulses. Subsequently transferred to [facility] for further evaluation and management. Seen by VVS who did not feel that there was any evidence of acute left limb ischemia.    Diuresed with IV lasix from 224 lbs to 182 l bs on discharge.  Pt also noted to have new onset PAF/Atrial flutter with CHA2DS2-VASc Score of 5 (7.2% per year) which continued despite Diltiazem.  Started on low dose amio to help with rate control, and started on Eliquis for anticoag.  Pt went for [facility] 04/18/16 with marginal cardiac output, moderate PAH, and 2 vessel CAD with high-grade mid LAD lesion.  Pt underwent successful PCI with stenting of the mid LAD with 0% residual stenosis.  Admitted 05/13/16 - 05/16/16 with COPD and CHF exacerbation. CXR with volume overload.  Treated with IV steroids and then prednisone taper.  Discharge weight 166 lbs.   S/p DCCV 06/14/16. Remains in NSR by EKG today.   Pt presents today for post DCCV follow up.  Weight down 2 lbs. Peeing much better on the torsemide than she was on lasix. Can't weight at home with BKA. Feels like her breathing is better, but doesn't have any objective thing that she  has done that is easier.  No SOB changing or bathing. Still doesn't have her prosthesis, so not doing PT. Continues to drink > 2L. Drinks glasses of water, 3 cups of ice, plus diet mountain dew. Watching salt. Not smoking at all. Denies lightheadedness or dizziness. Can't lay on back, chronically, with back problems.   R/LHC 04/18/16 - Prox RCA to Mid RCA lesion, 30 %stenosed. - Mid RCA to Dist RCA lesion, 50 %stenosed. - Dist RCA lesion, 30 %stenosed. - Ost LAD to Prox LAD lesion, 30 %stenosed. - Mid LAD lesion, 95 %stenosed.   Hemodynamic Findings: RA =  20 RV = 65/7/22 PA = 65/28 (40) PCW = 21 Fick cardiac output/index = 4.1/2.2 PVR = 4.7 WU FA sat = 92% PA sat = 50%, 48%   Past Medical History:  Diagnosis Date  . Anxiety   . Arthritis    "back" (10/30/2015)  . CHF (congestive heart failure) ([place])   . Chronic lower back pain   . COPD (chronic obstructive pulmonary disease) ([place])   . GERD (gastroesophageal reflux disease)   . Hyperlipidemia   . Hypertension   . Hypoxia   . Peripheral vascular disease ([place])   . Shingles Dec. 2016   Scalp  . Stroke ([place])   . Type II diabetes mellitus ([place])     Current Outpatient Prescriptions  Medication Sig Dispense Refill  . albuterol (PROVENTIL HFA;VENTOLIN HFA) 108 (90 Base) MCG/ACT inhaler Inhale  2 puffs into the lungs every 6 (six) hours as needed for wheezing or shortness of breath.    . ALPRAZolam (XANAX) 0.25 MG tablet Take 1 tablet (0.25 mg total) by mouth 2 (two) times daily. 30 tablet 0  . amiodarone (PACERONE) 200 MG tablet Take 1 tablet (200 mg total) by mouth daily. 30 tablet 1  . apixaban (ELIQUIS) 5 MG TABS tablet Take 1 tablet (5 mg total) by mouth 2 (two) times daily. 60 tablet 2  . atorvastatin (LIPITOR) 20 MG tablet Take 20 mg by mouth daily.    . citalopram (CELEXA) 20 MG tablet Take 20 mg by mouth every morning.     . clopidogrel (PLAVIX) 75 MG tablet Take 1 tablet (75 mg total) by mouth daily with breakfast. 30  tablet 12  . diltiazem (CARDIZEM CD) 240 MG 24 hr capsule Take 1 capsule (240 mg total) by mouth daily. 39 capsule 1  . fluticasone furoate-vilanterol (BREO ELLIPTA) 100-25 MCG/INH AEPB Inhale 1 puff into the lungs daily. 1 each 5  . gabapentin (NEURONTIN) 300 MG capsule Take 300 mg by mouth 2 (two) times daily.     . insulin aspart (NOVOLOG) 100 UNIT/ML injection Inject 10 Units into the skin 3 (three) times daily before meals.     . Insulin Glargine (LANTUS SOLOSTAR) 100 UNIT/ML Solostar Pen Inject 25 Units into the skin daily at 10 pm.    . ipratropium-albuterol (DUONEB) 0.5-2.5 (3) MG/3ML SOLN Take 3 mLs by nebulization every 6 (six) hours as needed (wheezing).     . metFORMIN (GLUCOPHAGE) 500 MG tablet Take 500 mg by mouth 2 (two) times daily with a meal.    . oxyCODONE-acetaminophen (PERCOCET) 10-325 MG tablet Take 1 tablet by mouth every 4 (four) hours as needed for pain.    . pantoprazole (PROTONIX) 40 MG tablet Take 40 mg by mouth every morning.     . sitaGLIPtin (JANUVIA) 100 MG tablet Take 100 mg by mouth every morning.    Marland Kitchen spironolactone (ALDACTONE) 25 MG tablet Take 0.5 tablets (12.5 mg total) by mouth daily. 30 tablet 0  . torsemide (DEMADEX) 20 MG tablet Take 2 tablets (40 mg total) by mouth daily. 60 tablet 3   No current facility-administered medications for this encounter.     Allergies  Allergen Reactions  . Codeine Rash  . Penicillins Other (See Comments)    Has patient had a PCN reaction causing immediate rash, facial/tongue/throat swelling, SOB or lightheadedness with hypotension: unknown Has patient had a PCN reaction causing severe rash involving mucus membranes or skin necrosis: unknown Has patient had a PCN reaction that required hospitalization unknown Has patient had a PCN reaction occurring within the last 10 years: unknown If all of the above answers are "NO", then may proceed with Cephalosporin use.  . Ciprofloxacin Hives      Social History   Social  History  . Marital status: Married    Spouse name: N/A  . Number of children: N/A  . Years of education: N/A   Occupational History  . Not on file.   Social History Main Topics  . Smoking status: Former Smoker    Packs/day: 0.12    Years: 49.00    Types: Cigarettes  . Smokeless tobacco: Never Used  . Alcohol use No  . Drug use: No  . Sexual activity: No   Other Topics Concern  . Not on file   Social History Narrative  . No narrative on file  Family History  Problem Relation Age of Onset  . Cancer Father   . Heart attack Sister   . Stroke Sister   . Hypertension Sister   . Hypertension Brother     Vitals:   06/18/16 1409  BP: 120/64  Pulse: (!) 57  SpO2: 92%  Weight: 169 lb 6.4 oz (76.8 kg)   Wt Readings from Last 3 Encounters:  06/18/16 169 lb 6.4 oz (76.8 kg)  06/14/16 163 lb (73.9 kg)  06/04/16 171 lb (77.6 kg)     PHYSICAL EXAM: General:  Elderly appearing HEENT: normal Neck: supple. JVD difficult to assess due to body habitus.  Carotids 2+ bilat; no bruits. No thyromegaly or lymphadenopathy noted.  Cor: PMI nondisplaced. Regular, slightly brady. No M/G/R appreciated Lungs: Diminished throughout.  Abdomen: NT, mild/mod distention, no HSM. No bruits or masses. +BS  Extremities: no cyanosis, clubbing, rash. S/p R BKA. L leg with 1+ edema.   Neuro: alert & oriented x 3, cranial nerves grossly intact. moves all 4 extremities w/o difficulty. Affect pleasant.  ECG: sinus brady 56 bpm  ASSESSMENT & PLAN:   1. Chronic diastolic HF with R>L symptoms and pulmonary and HTN on Echo 2. Chronic respiratory failure 3. PAF/Atrial Flutter - CHA2DS2-VASc Score of at least 5 4. PAD s/p R BKA 5. Abnormal Echo with anteroseptal/inferoseptal HK, RV HK, and pulmonary HTN 6. Severe COPD 7. DM2  Remains in NSR s/p DCCV 06/14/16. EKG today shows Sinus bradycardia 56 bpm   Volume status somewhat improved s/p DCCV.  Continues to drink > 2L. Stressed importance of  limiting fluid intake to allow for adjustment of her diuretics.    Recommend she restarts gait training/rehab and expedite repair of her prosthesis if possible.   Follow up 4 weeks.  Will have PCP send labs tomorrow.   Shirley Friar, PA-C 06/18/16

## 2016-07-16 ENCOUNTER — Encounter (HOSPITAL_COMMUNITY): Payer: Medicare Other

## 2016-07-17 ENCOUNTER — Ambulatory Visit (HOSPITAL_COMMUNITY)
Admission: RE | Admit: 2016-07-17 | Discharge: 2016-07-17 | Disposition: A | Discharge status: Home / Self Care | Payer: Medicare Other | Source: Ambulatory Visit | Attending: Cardiology | Admitting: Cardiology

## 2016-07-17 ENCOUNTER — Encounter (HOSPITAL_COMMUNITY): Payer: Self-pay | Admitting: Cardiology

## 2016-07-17 VITALS — BP 152/68 | HR 66 | Wt 164.8 lb

## 2016-07-17 DIAGNOSIS — E785 Hyperlipidemia, unspecified: Secondary | ICD-10-CM | POA: Diagnosis not present

## 2016-07-17 DIAGNOSIS — I739 Peripheral vascular disease, unspecified: Secondary | ICD-10-CM

## 2016-07-17 DIAGNOSIS — Z89511 Acquired absence of right leg below knee: Secondary | ICD-10-CM | POA: Insufficient documentation

## 2016-07-17 DIAGNOSIS — Z8673 Personal history of transient ischemic attack (TIA), and cerebral infarction without residual deficits: Secondary | ICD-10-CM | POA: Insufficient documentation

## 2016-07-17 DIAGNOSIS — Z794 Long term (current) use of insulin: Secondary | ICD-10-CM | POA: Diagnosis not present

## 2016-07-17 DIAGNOSIS — I110 Hypertensive heart disease with heart failure: Secondary | ICD-10-CM | POA: Diagnosis present

## 2016-07-17 DIAGNOSIS — Z87891 Personal history of nicotine dependence: Secondary | ICD-10-CM | POA: Insufficient documentation

## 2016-07-17 DIAGNOSIS — E1151 Type 2 diabetes mellitus with diabetic peripheral angiopathy without gangrene: Secondary | ICD-10-CM | POA: Insufficient documentation

## 2016-07-17 DIAGNOSIS — M545 Low back pain: Secondary | ICD-10-CM | POA: Diagnosis not present

## 2016-07-17 DIAGNOSIS — I2510 Atherosclerotic heart disease of native coronary artery without angina pectoris: Secondary | ICD-10-CM

## 2016-07-17 DIAGNOSIS — E1142 Type 2 diabetes mellitus with diabetic polyneuropathy: Secondary | ICD-10-CM | POA: Diagnosis not present

## 2016-07-17 DIAGNOSIS — J449 Chronic obstructive pulmonary disease, unspecified: Secondary | ICD-10-CM | POA: Diagnosis not present

## 2016-07-17 DIAGNOSIS — Z880 Allergy status to penicillin: Secondary | ICD-10-CM | POA: Diagnosis not present

## 2016-07-17 DIAGNOSIS — I5032 Chronic diastolic (congestive) heart failure: Secondary | ICD-10-CM

## 2016-07-17 DIAGNOSIS — I4892 Unspecified atrial flutter: Secondary | ICD-10-CM

## 2016-07-17 DIAGNOSIS — J9610 Chronic respiratory failure, unspecified whether with hypoxia or hypercapnia: Secondary | ICD-10-CM | POA: Insufficient documentation

## 2016-07-17 DIAGNOSIS — K219 Gastro-esophageal reflux disease without esophagitis: Secondary | ICD-10-CM | POA: Diagnosis not present

## 2016-07-17 DIAGNOSIS — Z7901 Long term (current) use of anticoagulants: Secondary | ICD-10-CM | POA: Diagnosis not present

## 2016-07-17 DIAGNOSIS — Z79899 Other long term (current) drug therapy: Secondary | ICD-10-CM | POA: Diagnosis not present

## 2016-07-17 DIAGNOSIS — E1165 Type 2 diabetes mellitus with hyperglycemia: Secondary | ICD-10-CM

## 2016-07-17 DIAGNOSIS — G8929 Other chronic pain: Secondary | ICD-10-CM

## 2016-07-17 LAB — BASIC METABOLIC PANEL
ANION GAP: 9 (ref 5–15)
BUN: 15 mg/dL (ref 6–20)
CALCIUM: 9.8 mg/dL (ref 8.9–10.3)
CO2: 34 mmol/L — ABNORMAL HIGH (ref 22–32)
Chloride: 94 mmol/L — ABNORMAL LOW (ref 101–111)
Creatinine, Ser: 1.11 mg/dL — ABNORMAL HIGH (ref 0.44–1.00)
GFR, EST AFRICAN AMERICAN: 59 mL/min — AB (ref >60)
GFR, EST NON AFRICAN AMERICAN: 51 mL/min — AB (ref >60)
Glucose, Bld: 125 mg/dL — ABNORMAL HIGH (ref 65–99)
POTASSIUM: 3.9 mmol/L (ref 3.5–5.1)
Sodium: 137 mmol/L (ref 135–145)

## 2016-07-17 MED ORDER — AMIODARONE HCL 200 MG PO TABS: 200.0000 mg | ORAL_TABLET | Freq: Two times a day (BID) | ORAL | 3 refills | Status: AC

## 2016-07-17 NOTE — Patient Instructions (Addendum)
INCREASE Amiodarone to 200 mg, one tab twice a day  Labs today We will only contact you if something comes back abnormal or we need to make some changes. Otherwise no news is good news!  You have been referred to [facility] Dr Lovena Le for electrophysiology  Your physician has recommended that you have a Cardioversion (DCCV). Electrical Cardioversion uses a jolt of electricity to your heart either through paddles or wired patches attached to your chest. This is a controlled, usually prescheduled, procedure. Defibrillation is done under light anesthesia in the hospital, and you usually go home the day of the procedure. This is done to get your heart back into a normal rhythm. You are not awake for the procedure. Please see the instruction sheet given to you today.    Your physician recommends that you schedule a follow-up appointment in: 2 weeks after your procedure

## 2016-07-17 NOTE — Progress Notes (Signed)
Advanced Heart Failure Clinic Note   Primary Care: Lucia Gaskins, MD Primary Cardiologist: Dr. Acie Fredrickson Primary HF: Dr. Haroldine Laws   HPI:  [name] is a 65 y.o. 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 She has been followed by Dr. Acie Fredrickson for sinus tachycardia (versus SVT) and diastolic HF.   She presented to [facility] ED 7/24 with AMS, generalized edema, and SOB. Became cyanotic in her face and lips. Had mild hypercarbia (7.23 / 64 / 100), AoCKD and lactic acidosis. CT of head was unremarkable. CT ABD/Pelv showed anasarca with mild ascites and small bilateral pleural effusions.  Left foot noted to be cool to touch with no palpable or dopplerable pulses. Subsequently transferred to [facility] for further evaluation and management. Seen by [facility] who did not feel that there was any evidence of acute left limb ischemia.    Diuresed with IV lasix from 224 lbs to 182 l bs on discharge.  Pt also noted to have new onset PAF/Atrial flutter with CHA2DS2-VASc Score of 5 (7.2% per year) which continued despite Diltiazem.  Started on low dose amio to help with rate control, and started on Eliquis for anticoag.  Pt went for [facility] 04/18/16 with marginal cardiac output, moderate PAH, and 2 vessel CAD with high-grade mid LAD lesion.  Pt underwent successful PCI with stenting of the mid LAD with 0% residual stenosis.  Admitted 05/13/16 - 05/16/16 with COPD and CHF exacerbation. CXR with volume overload.  Treated with IV steroids and then prednisone taper.  Discharge weight 166 lbs.   S/p DCCV 06/14/16. Remains in NSR by EKG today.   She presents today for regular follow up. Weight down 7 lbs from last visit. BP mildly elevated but only took her medications about 30 minutes prior to arrival.  Saw eye doctor last week and needs to get cataract surgery.  Wears 02 to  sleep and when moving around house. In [place]. Occasionally gets SOB with changing clothes and bathing. Denies dizziness or lightheadedness. Denies palpitations. Still drinking well over 2L daily, but peeing very frequently. No added salt, limits intake. No smoking. Doesn't lie flat chronically with back problems. Still holding off on rehab. Has been having bad phantom pains.   R/LHC 04/18/16 - Prox RCA to Mid RCA lesion, 30 %stenosed. - Mid RCA to Dist RCA lesion, 50 %stenosed. - Dist RCA lesion, 30 %stenosed. - Ost LAD to Prox LAD lesion, 30 %stenosed. - Mid LAD lesion, 95 %stenosed.   Hemodynamic Findings: RA =  20 RV = 65/7/22 PA = 65/28 (40) PCW = 21 Fick cardiac output/index = 4.1/2.2 PVR = 4.7 WU FA sat = 92% PA sat = 50%, 48%   Past Medical History:  Diagnosis Date  . Anxiety   . Arthritis    "back" (10/30/2015)  . CHF (congestive heart failure) ([place])   . Chronic lower back pain   . COPD (chronic obstructive pulmonary disease) ([place])   . GERD (gastroesophageal reflux disease)   . Hyperlipidemia   . Hypertension   . Hypoxia   . Peripheral vascular disease ([place])   . Shingles Dec. 2016   Scalp  . Stroke ([place])   . Type II diabetes mellitus ([place])     Current Outpatient Prescriptions  Medication Sig Dispense Refill  . albuterol (PROVENTIL HFA;VENTOLIN HFA) 108 (90 Base) MCG/ACT  inhaler Inhale 2 puffs into the lungs every 6 (six) hours as needed for wheezing or shortness of breath.    . ALPRAZolam (XANAX) 0.25 MG tablet Take 1 tablet (0.25 mg total) by mouth 2 (two) times daily. 30 tablet 0  . amiodarone (PACERONE) 200 MG tablet Take 1 tablet (200 mg total) by mouth daily. 30 tablet 1  . apixaban (ELIQUIS) 5 MG TABS tablet Take 1 tablet (5 mg total) by mouth 2 (two) times daily. 60 tablet 2  . atorvastatin (LIPITOR) 20 MG tablet Take 20 mg by mouth daily.    . citalopram (CELEXA) 20 MG tablet Take 20 mg by mouth every morning.     . clopidogrel (PLAVIX) 75 MG tablet Take 1  tablet (75 mg total) by mouth daily with breakfast. 30 tablet 12  . diltiazem (CARDIZEM CD) 240 MG 24 hr capsule Take 1 capsule (240 mg total) by mouth daily. 39 capsule 1  . fluticasone furoate-vilanterol (BREO ELLIPTA) 100-25 MCG/INH AEPB Inhale 1 puff into the lungs daily. 1 each 5  . gabapentin (NEURONTIN) 300 MG capsule Take 300 mg by mouth 2 (two) times daily.     . insulin aspart (NOVOLOG) 100 UNIT/ML injection Inject 10 Units into the skin 3 (three) times daily before meals.     . Insulin Glargine (LANTUS SOLOSTAR) 100 UNIT/ML Solostar Pen Inject 25 Units into the skin daily at 10 pm.    . ipratropium-albuterol (DUONEB) 0.5-2.5 (3) MG/3ML SOLN Take 3 mLs by nebulization every 6 (six) hours as needed (wheezing).     . metFORMIN (GLUCOPHAGE) 500 MG tablet Take 500 mg by mouth 2 (two) times daily with a meal.    . oxyCODONE-acetaminophen (PERCOCET) 10-325 MG tablet Take 1 tablet by mouth every 4 (four) hours as needed for pain.    . pantoprazole (PROTONIX) 40 MG tablet Take 40 mg by mouth every morning.     . sitaGLIPtin (JANUVIA) 100 MG tablet Take 100 mg by mouth every morning.    Marland Kitchen spironolactone (ALDACTONE) 25 MG tablet Take 0.5 tablets (12.5 mg total) by mouth daily. 30 tablet 0  . torsemide (DEMADEX) 20 MG tablet Take 2 tablets (40 mg total) by mouth daily. 60 tablet 3   No current facility-administered medications for this encounter.     Allergies  Allergen Reactions  . Codeine Rash  . Penicillins Other (See Comments)    Has patient had a PCN reaction causing immediate rash, facial/tongue/throat swelling, SOB or lightheadedness with hypotension: unknown Has patient had a PCN reaction causing severe rash involving mucus membranes or skin necrosis: unknown Has patient had a PCN reaction that required hospitalization unknown Has patient had a PCN reaction occurring within the last 10 years: unknown If all of the above answers are "NO", then may proceed with Cephalosporin use.  .  Ciprofloxacin Hives      Social History   Social History  . Marital status: Married    Spouse name: N/A  . Number of children: N/A  . Years of education: N/A   Occupational History  . Not on file.   Social History Main Topics  . Smoking status: Former Smoker    Packs/day: 0.12    Years: 49.00    Types: Cigarettes  . Smokeless tobacco: Never Used  . Alcohol use No  . Drug use: No  . Sexual activity: No   Other Topics Concern  . Not on file   Social History Narrative  . No narrative on file  Family History  Problem Relation Age of Onset  . Cancer Father   . Heart attack Sister   . Stroke Sister   . Hypertension Sister   . Hypertension Brother     Vitals:   07/17/16 1111  BP: (!) 152/68  Pulse: 66  SpO2: 94%  Weight: 164 lb 12.8 oz (74.8 kg)   Wt Readings from Last 3 Encounters:  07/17/16 164 lb 12.8 oz (74.8 kg)  06/18/16 169 lb 6.4 oz (76.8 kg)  06/14/16 163 lb (73.9 kg)     PHYSICAL EXAM: General:  Elderly appearing HEENT: normal Neck: supple. JVD difficult to assess. Appears 6-7 cm. Carotids 2+ bilat; no bruits. No thyromegaly or nodule noted.   Cor: PMI nondisplaced. Regular, slightly brady. No M/G/R noted.  Lungs: Mildly diminished basilar sounds. Abdomen: soft, NT, ND, no HSM. No bruits or masses. +BS  Extremities: no cyanosis, clubbing, rash. S/p R BKA. L leg with trace edema.    Neuro: alert & oriented x 3, cranial nerves grossly intact. moves all 4 extremities w/o difficulty. Affect pleasant.  ASSESSMENT & PLAN:   1. Chronic diastolic HF with R>L symptoms and pulmonary and HTN on Echo 2. Chronic respiratory failure 3. PAF/Atrial Flutter - CHA2DS2-VASc Score of at least 5 4. PAD s/p R BKA 5. Abnormal Echo with anteroseptal/inferoseptal HK, RV HK, and pulmonary HTN 6. Severe COPD 7. DM2  EKG today shows Aflutter with controlled ventricular rate at 67 bpm. As it has been only ~ 1 month, will plan repeat DCCV and refer to Dr Lovena Le to  consider AF ablation.  Despite being in Aflutter, volume status looks stable. Down 5 lbs from last visit. Continue torsemide 40 mg daily and spironolactone 12.5 mg daily. She does continue to drink large amounts of fluid. Stressed importance of fluid and salt restriction.   Follow up in 2 weeks s/p DCCV.  Anticipate increasing spiro if BP remains elevated.   Shirley Friar, PA-C 07/17/16

## 2016-07-19 ENCOUNTER — Other Ambulatory Visit (HOSPITAL_COMMUNITY): Payer: Self-pay

## 2016-07-19 ENCOUNTER — Telehealth (HOSPITAL_COMMUNITY): Payer: Self-pay | Admitting: Vascular Surgery

## 2016-07-19 DIAGNOSIS — I480 Paroxysmal atrial fibrillation: Secondary | ICD-10-CM

## 2016-07-19 NOTE — Telephone Encounter (Signed)
Left second message on [facility] [place] office, also sent Alphonsus Sias in basket message

## 2016-07-22 ENCOUNTER — Encounter (HOSPITAL_COMMUNITY)
Admission: RE | Disposition: A | Discharge status: Home / Self Care | Payer: Self-pay | Source: Ambulatory Visit | Attending: Internal Medicine

## 2016-07-22 ENCOUNTER — Inpatient Hospital Stay (HOSPITAL_COMMUNITY)
Admission: RE | Admit: 2016-07-22 | Discharge: 2016-07-24 | DRG: 291 | Disposition: A | Discharge status: Home / Self Care | Payer: Medicare Other | Source: Ambulatory Visit | Attending: Internal Medicine | Admitting: Internal Medicine

## 2016-07-22 ENCOUNTER — Ambulatory Visit (HOSPITAL_COMMUNITY): Payer: Medicare Other | Admitting: Anesthesiology

## 2016-07-22 ENCOUNTER — Encounter (HOSPITAL_COMMUNITY): Payer: Self-pay | Admitting: *Deleted

## 2016-07-22 DIAGNOSIS — Z79891 Long term (current) use of opiate analgesic: Secondary | ICD-10-CM

## 2016-07-22 DIAGNOSIS — R000 Tachycardia, unspecified: Secondary | ICD-10-CM | POA: Diagnosis present

## 2016-07-22 DIAGNOSIS — G4730 Sleep apnea, unspecified: Secondary | ICD-10-CM | POA: Diagnosis present

## 2016-07-22 DIAGNOSIS — Z87891 Personal history of nicotine dependence: Secondary | ICD-10-CM

## 2016-07-22 DIAGNOSIS — E1151 Type 2 diabetes mellitus with diabetic peripheral angiopathy without gangrene: Secondary | ICD-10-CM | POA: Diagnosis present

## 2016-07-22 DIAGNOSIS — I110 Hypertensive heart disease with heart failure: Principal | ICD-10-CM | POA: Diagnosis present

## 2016-07-22 DIAGNOSIS — I509 Heart failure, unspecified: Secondary | ICD-10-CM

## 2016-07-22 DIAGNOSIS — Z972 Presence of dental prosthetic device (complete) (partial): Secondary | ICD-10-CM

## 2016-07-22 DIAGNOSIS — Z823 Family history of stroke: Secondary | ICD-10-CM

## 2016-07-22 DIAGNOSIS — Z79899 Other long term (current) drug therapy: Secondary | ICD-10-CM | POA: Diagnosis not present

## 2016-07-22 DIAGNOSIS — I5033 Acute on chronic diastolic (congestive) heart failure: Secondary | ICD-10-CM | POA: Diagnosis present

## 2016-07-22 DIAGNOSIS — I2510 Atherosclerotic heart disease of native coronary artery without angina pectoris: Secondary | ICD-10-CM | POA: Diagnosis present

## 2016-07-22 DIAGNOSIS — G8929 Other chronic pain: Secondary | ICD-10-CM | POA: Diagnosis present

## 2016-07-22 DIAGNOSIS — E785 Hyperlipidemia, unspecified: Secondary | ICD-10-CM | POA: Diagnosis present

## 2016-07-22 DIAGNOSIS — I5032 Chronic diastolic (congestive) heart failure: Secondary | ICD-10-CM

## 2016-07-22 DIAGNOSIS — J9621 Acute and chronic respiratory failure with hypoxia: Secondary | ICD-10-CM | POA: Diagnosis not present

## 2016-07-22 DIAGNOSIS — I4892 Unspecified atrial flutter: Secondary | ICD-10-CM | POA: Diagnosis present

## 2016-07-22 DIAGNOSIS — I6529 Occlusion and stenosis of unspecified carotid artery: Secondary | ICD-10-CM | POA: Diagnosis present

## 2016-07-22 DIAGNOSIS — I4891 Unspecified atrial fibrillation: Secondary | ICD-10-CM | POA: Diagnosis not present

## 2016-07-22 DIAGNOSIS — Z794 Long term (current) use of insulin: Secondary | ICD-10-CM | POA: Diagnosis not present

## 2016-07-22 DIAGNOSIS — I739 Peripheral vascular disease, unspecified: Secondary | ICD-10-CM | POA: Diagnosis present

## 2016-07-22 DIAGNOSIS — Z7901 Long term (current) use of anticoagulants: Secondary | ICD-10-CM | POA: Diagnosis not present

## 2016-07-22 DIAGNOSIS — J9620 Acute and chronic respiratory failure, unspecified whether with hypoxia or hypercapnia: Secondary | ICD-10-CM | POA: Diagnosis present

## 2016-07-22 DIAGNOSIS — M545 Low back pain: Secondary | ICD-10-CM | POA: Diagnosis present

## 2016-07-22 DIAGNOSIS — Z8249 Family history of ischemic heart disease and other diseases of the circulatory system: Secondary | ICD-10-CM

## 2016-07-22 DIAGNOSIS — Z7902 Long term (current) use of antithrombotics/antiplatelets: Secondary | ICD-10-CM | POA: Diagnosis not present

## 2016-07-22 DIAGNOSIS — J449 Chronic obstructive pulmonary disease, unspecified: Secondary | ICD-10-CM | POA: Diagnosis present

## 2016-07-22 DIAGNOSIS — Z7951 Long term (current) use of inhaled steroids: Secondary | ICD-10-CM | POA: Diagnosis not present

## 2016-07-22 DIAGNOSIS — Z955 Presence of coronary angioplasty implant and graft: Secondary | ICD-10-CM

## 2016-07-22 DIAGNOSIS — Z885 Allergy status to narcotic agent status: Secondary | ICD-10-CM

## 2016-07-22 DIAGNOSIS — Z880 Allergy status to penicillin: Secondary | ICD-10-CM

## 2016-07-22 DIAGNOSIS — F419 Anxiety disorder, unspecified: Secondary | ICD-10-CM | POA: Diagnosis present

## 2016-07-22 DIAGNOSIS — Z881 Allergy status to other antibiotic agents status: Secondary | ICD-10-CM

## 2016-07-22 DIAGNOSIS — I480 Paroxysmal atrial fibrillation: Secondary | ICD-10-CM | POA: Diagnosis present

## 2016-07-22 DIAGNOSIS — K219 Gastro-esophageal reflux disease without esophagitis: Secondary | ICD-10-CM | POA: Diagnosis present

## 2016-07-22 DIAGNOSIS — I459 Conduction disorder, unspecified: Secondary | ICD-10-CM | POA: Diagnosis present

## 2016-07-22 DIAGNOSIS — Z89511 Acquired absence of right leg below knee: Secondary | ICD-10-CM

## 2016-07-22 DIAGNOSIS — Z981 Arthrodesis status: Secondary | ICD-10-CM

## 2016-07-22 DIAGNOSIS — J9622 Acute and chronic respiratory failure with hypercapnia: Secondary | ICD-10-CM | POA: Diagnosis present

## 2016-07-22 DIAGNOSIS — Z8673 Personal history of transient ischemic attack (TIA), and cerebral infarction without residual deficits: Secondary | ICD-10-CM

## 2016-07-22 HISTORY — DX: Dependence on supplemental oxygen: Z99.81

## 2016-07-22 LAB — CBC WITH DIFFERENTIAL/PLATELET
BASOS ABS: 0 10*3/uL (ref 0.0–0.1)
BASOS PCT: 0 %
EOS ABS: 0 10*3/uL (ref 0.0–0.7)
EOS PCT: 0 %
HCT: 28.2 % — ABNORMAL LOW (ref 36.0–46.0)
Hemoglobin: 8.7 g/dL — ABNORMAL LOW (ref 12.0–15.0)
LYMPHS PCT: 11 %
Lymphs Abs: 1.3 10*3/uL (ref 0.7–4.0)
MCH: 24.1 pg — ABNORMAL LOW (ref 26.0–34.0)
MCHC: 30.9 g/dL (ref 30.0–36.0)
MCV: 78.1 fL (ref 78.0–100.0)
Monocytes Absolute: 0.5 10*3/uL (ref 0.1–1.0)
Monocytes Relative: 4 %
Neutro Abs: 9.2 10*3/uL — ABNORMAL HIGH (ref 1.7–7.7)
Neutrophils Relative %: 85 %
PLATELETS: 393 10*3/uL (ref 150–400)
RBC: 3.61 MIL/uL — AB (ref 3.87–5.11)
RDW: 16.4 % — ABNORMAL HIGH (ref 11.5–15.5)
WBC: 11 10*3/uL — AB (ref 4.0–10.5)

## 2016-07-22 LAB — COMPREHENSIVE METABOLIC PANEL
ALT: 10 U/L — ABNORMAL LOW (ref 14–54)
AST: 20 U/L (ref 15–41)
Albumin: 4.1 g/dL (ref 3.5–5.0)
Alkaline Phosphatase: 84 U/L (ref 38–126)
Anion gap: 12 (ref 5–15)
BILIRUBIN TOTAL: 0.8 mg/dL (ref 0.3–1.2)
BUN: 23 mg/dL — AB (ref 6–20)
CO2: 32 mmol/L (ref 22–32)
CREATININE: 1.36 mg/dL — AB (ref 0.44–1.00)
Calcium: 10 mg/dL (ref 8.9–10.3)
Chloride: 85 mmol/L — ABNORMAL LOW (ref 101–111)
GFR calc Af Amer: 46 mL/min — ABNORMAL LOW (ref >60)
GFR, EST NON AFRICAN AMERICAN: 40 mL/min — AB (ref >60)
Glucose, Bld: 130 mg/dL — ABNORMAL HIGH (ref 65–99)
POTASSIUM: 5.1 mmol/L (ref 3.5–5.1)
Sodium: 129 mmol/L — ABNORMAL LOW (ref 135–145)
TOTAL PROTEIN: 7 g/dL (ref 6.5–8.1)

## 2016-07-22 LAB — GLUCOSE, CAPILLARY
GLUCOSE-CAPILLARY: 133 mg/dL — AB (ref 65–99)
GLUCOSE-CAPILLARY: 170 mg/dL — AB (ref 65–99)
GLUCOSE-CAPILLARY: 69 mg/dL (ref 65–99)
GLUCOSE-CAPILLARY: 102 mg/dL — AB (ref 65–99)

## 2016-07-22 LAB — SURGICAL PCR SCREEN
MRSA, PCR: POSITIVE — AB
STAPHYLOCOCCUS AUREUS: POSITIVE — AB

## 2016-07-22 LAB — PROTIME-INR
INR: 1.89
Prothrombin Time: 22 seconds — ABNORMAL HIGH (ref 11.4–15.2)

## 2016-07-22 LAB — BRAIN NATRIURETIC PEPTIDE: B NATRIURETIC PEPTIDE 5: 326 pg/mL — AB (ref 0.0–100.0)

## 2016-07-22 LAB — MAGNESIUM: MAGNESIUM: 1.5 mg/dL — AB (ref 1.7–2.4)

## 2016-07-22 SURGERY — CANCELLED PROCEDURE

## 2016-07-22 MED ORDER — ACETAMINOPHEN 325 MG PO TABS
650.0000 mg | ORAL_TABLET | ORAL | Status: DC | PRN
  Administered 2016-07-22: 650 mg via ORAL
  Filled 2016-07-22: qty 2

## 2016-07-22 MED ORDER — ATORVASTATIN CALCIUM 20 MG PO TABS
20.0000 mg | ORAL_TABLET | Freq: Every day | ORAL | Status: DC
  Administered 2016-07-23: 20 mg via ORAL
  Filled 2016-07-22: qty 1

## 2016-07-22 MED ORDER — MAGNESIUM SULFATE 50 % IJ SOLN
3.0000 g | Freq: Once | INTRAVENOUS | Status: AC
  Administered 2016-07-22: 3 g via INTRAVENOUS
  Filled 2016-07-22: qty 6

## 2016-07-22 MED ORDER — IPRATROPIUM-ALBUTEROL 0.5-2.5 (3) MG/3ML IN SOLN: 3.0000 mL | Freq: Four times a day (QID) | RESPIRATORY_TRACT | Status: DC | PRN

## 2016-07-22 MED ORDER — SODIUM CHLORIDE 0.9% FLUSH: 3.0000 mL | INTRAVENOUS | Status: DC | PRN

## 2016-07-22 MED ORDER — LINAGLIPTIN 5 MG PO TABS
5.0000 mg | ORAL_TABLET | Freq: Every day | ORAL | Status: DC
  Administered 2016-07-23: 5 mg via ORAL
  Filled 2016-07-22: qty 1

## 2016-07-22 MED ORDER — MUPIROCIN 2 % EX OINT
1.0000 "application " | TOPICAL_OINTMENT | Freq: Two times a day (BID) | CUTANEOUS | Status: DC
  Administered 2016-07-22 – 2016-07-23 (×3): 1 via NASAL
  Filled 2016-07-22: qty 22

## 2016-07-22 MED ORDER — SODIUM CHLORIDE 0.9 % IV SOLN
INTRAVENOUS | Status: DC
  Administered 2016-07-22: 15:00:00 via INTRAVENOUS

## 2016-07-22 MED ORDER — METFORMIN HCL 500 MG PO TABS
500.0000 mg | ORAL_TABLET | Freq: Two times a day (BID) | ORAL | Status: DC
  Administered 2016-07-22 – 2016-07-23 (×3): 500 mg via ORAL
  Filled 2016-07-22 (×3): qty 1

## 2016-07-22 MED ORDER — SODIUM CHLORIDE 0.9% FLUSH
3.0000 mL | Freq: Two times a day (BID) | INTRAVENOUS | Status: DC
  Administered 2016-07-22: 3 mL via INTRAVENOUS

## 2016-07-22 MED ORDER — OXYCODONE HCL 5 MG PO TABS
5.0000 mg | ORAL_TABLET | ORAL | Status: DC | PRN
  Administered 2016-07-23 (×2): 5 mg via ORAL
  Filled 2016-07-22 (×2): qty 1

## 2016-07-22 MED ORDER — SODIUM CHLORIDE 0.9% FLUSH: 3.0000 mL | Freq: Two times a day (BID) | INTRAVENOUS | Status: DC

## 2016-07-22 MED ORDER — SODIUM CHLORIDE 0.9 % IV SOLN: 250.0000 mL | INTRAVENOUS | Status: DC

## 2016-07-22 MED ORDER — SPIRONOLACTONE 25 MG PO TABS
12.5000 mg | ORAL_TABLET | Freq: Every day | ORAL | Status: DC
  Administered 2016-07-23: 12.5 mg via ORAL
  Filled 2016-07-22 (×2): qty 1

## 2016-07-22 MED ORDER — FUROSEMIDE 10 MG/ML IJ SOLN
80.0000 mg | Freq: Two times a day (BID) | INTRAMUSCULAR | Status: DC
  Administered 2016-07-22 – 2016-07-23 (×4): 80 mg via INTRAVENOUS
  Filled 2016-07-22 (×4): qty 8

## 2016-07-22 MED ORDER — HYDROCORTISONE 1 % EX CREA
1.0000 "application " | TOPICAL_CREAM | Freq: Three times a day (TID) | CUTANEOUS | Status: DC | PRN
  Filled 2016-07-22: qty 28

## 2016-07-22 MED ORDER — OXYCODONE-ACETAMINOPHEN 5-325 MG PO TABS
1.0000 | ORAL_TABLET | ORAL | Status: DC | PRN
  Administered 2016-07-22 – 2016-07-23 (×3): 1 via ORAL
  Filled 2016-07-22 (×3): qty 1

## 2016-07-22 MED ORDER — ALPRAZOLAM 0.25 MG PO TABS
0.2500 mg | ORAL_TABLET | Freq: Two times a day (BID) | ORAL | Status: DC
  Administered 2016-07-22 – 2016-07-23 (×3): 0.25 mg via ORAL
  Filled 2016-07-22 (×3): qty 1

## 2016-07-22 MED ORDER — AMIODARONE HCL 200 MG PO TABS
200.0000 mg | ORAL_TABLET | Freq: Two times a day (BID) | ORAL | Status: DC
  Administered 2016-07-22 – 2016-07-23 (×3): 200 mg via ORAL
  Filled 2016-07-22 (×3): qty 1

## 2016-07-22 MED ORDER — CLOPIDOGREL BISULFATE 75 MG PO TABS
75.0000 mg | ORAL_TABLET | Freq: Every day | ORAL | Status: DC
  Administered 2016-07-23: 75 mg via ORAL
  Filled 2016-07-22: qty 1

## 2016-07-22 MED ORDER — CHLORHEXIDINE GLUCONATE CLOTH 2 % EX PADS
6.0000 | MEDICATED_PAD | Freq: Every day | CUTANEOUS | Status: DC
  Administered 2016-07-23: 6 via TOPICAL

## 2016-07-22 MED ORDER — DILTIAZEM HCL ER COATED BEADS 240 MG PO CP24
240.0000 mg | ORAL_CAPSULE | Freq: Every day | ORAL | Status: DC
  Administered 2016-07-23: 240 mg via ORAL
  Filled 2016-07-22: qty 1

## 2016-07-22 MED ORDER — FLUTICASONE FUROATE-VILANTEROL 100-25 MCG/INH IN AEPB
1.0000 | INHALATION_SPRAY | Freq: Every day | RESPIRATORY_TRACT | Status: DC
  Filled 2016-07-22: qty 28

## 2016-07-22 MED ORDER — ALBUTEROL SULFATE (2.5 MG/3ML) 0.083% IN NEBU: 3.0000 mL | INHALATION_SOLUTION | Freq: Four times a day (QID) | RESPIRATORY_TRACT | Status: DC | PRN

## 2016-07-22 MED ORDER — INSULIN GLARGINE 100 UNIT/ML ~~LOC~~ SOLN
15.0000 [IU] | Freq: Every day | SUBCUTANEOUS | Status: DC
  Administered 2016-07-23: 15 [IU] via SUBCUTANEOUS
  Filled 2016-07-22 (×3): qty 0.15

## 2016-07-22 MED ORDER — GABAPENTIN 300 MG PO CAPS
300.0000 mg | ORAL_CAPSULE | Freq: Two times a day (BID) | ORAL | Status: DC
  Administered 2016-07-22 – 2016-07-23 (×3): 300 mg via ORAL
  Filled 2016-07-22 (×3): qty 1

## 2016-07-22 MED ORDER — APIXABAN 5 MG PO TABS
5.0000 mg | ORAL_TABLET | Freq: Two times a day (BID) | ORAL | Status: DC
  Administered 2016-07-22 – 2016-07-24 (×4): 5 mg via ORAL
  Filled 2016-07-22 (×4): qty 1

## 2016-07-22 MED ORDER — SODIUM CHLORIDE 0.9 % IV SOLN
250.0000 mL | INTRAVENOUS | Status: DC | PRN
  Administered 2016-07-24: 08:00:00 via INTRAVENOUS

## 2016-07-22 MED ORDER — INSULIN ASPART 100 UNIT/ML ~~LOC~~ SOLN
0.0000 [IU] | Freq: Three times a day (TID) | SUBCUTANEOUS | Status: DC
  Administered 2016-07-22 (×2): 3 [IU] via SUBCUTANEOUS
  Administered 2016-07-23: 2 [IU] via SUBCUTANEOUS

## 2016-07-22 MED ORDER — INSULIN GLARGINE 100 UNIT/ML ~~LOC~~ SOLN
25.0000 [IU] | Freq: Every day | SUBCUTANEOUS | Status: DC
  Filled 2016-07-22: qty 0.25

## 2016-07-22 MED ORDER — ONDANSETRON HCL 4 MG/2ML IJ SOLN: 4.0000 mg | Freq: Four times a day (QID) | INTRAMUSCULAR | Status: DC | PRN

## 2016-07-22 MED ORDER — INSULIN ASPART 100 UNIT/ML ~~LOC~~ SOLN: 0.0000 [IU] | Freq: Every day | SUBCUTANEOUS | Status: DC

## 2016-07-22 MED ORDER — CITALOPRAM HYDROBROMIDE 20 MG PO TABS
20.0000 mg | ORAL_TABLET | Freq: Every morning | ORAL | Status: DC
  Administered 2016-07-23: 20 mg via ORAL
  Filled 2016-07-22: qty 1

## 2016-07-22 MED ORDER — OXYCODONE-ACETAMINOPHEN 10-325 MG PO TABS: 1.0000 | ORAL_TABLET | Freq: Four times a day (QID) | ORAL | Status: DC | PRN

## 2016-07-22 MED ORDER — PANTOPRAZOLE SODIUM 40 MG PO TBEC
40.0000 mg | DELAYED_RELEASE_TABLET | Freq: Every morning | ORAL | Status: DC
  Administered 2016-07-23: 40 mg via ORAL
  Filled 2016-07-22: qty 1

## 2016-07-22 NOTE — H&P (Signed)
Advanced Heart Failure Team History and Physical Note  Primary Care: Lucia Gaskins, MD Primary Cardiologist: Dr. Acie Fredrickson Primary HF: Dr. Haroldine Laws   Reason for Admission: CHF   HPI:    [name] is a 65 y.o. 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 She has been followed by Dr. Acie Fredrickson for sinus tachycardia (versus SVT) and diastolic HF.   She presented to [facility] ED 04/08/16 with AMS, generalized edema, and SOB. Became cyanotic in her face and lips. Had mild hypercarbia (7.23 / 64 / 100), AoCKD and lactic acidosis.  Diuresed with IV lasix from 224 lbs to 182 l bs on discharge.  Pt also noted to have new onset PAF/Atrial flutter with CHA2DS2-VASc Score of 5 (7.2% per year) which continued despite Diltiazem.  Started on low dose amio to help with rate control, and started on Eliquis for anticoag.  Pt went for [facility] 04/18/16 with marginal cardiac output, moderate PAH, and 2 vessel CAD with high-grade mid LAD lesion.  Pt underwent successful PCI with stenting of the mid LAD with 0% residual stenosis.  Admitted 05/13/16 - 05/16/16 with COPD and CHF exacerbation. CXR with volume overload.  Treated with IV steroids and then prednisone taper.  Discharge weight 166 lbs.   Seen in HF Clinic recently. Volume status ok but found to be back in AFL. Scheduled for elective DC-CV.  She presented today for elective DC-CV. On arrival noted to be significantly volume overloaded with progressive respiratory distress. Sats down to 60% when taken off O2 for transport from wheelchair to bed. +orthopnea and PND. Non-productive cough. No fevers or chills.    Review of Systems: [y] = yes, [ ]  = no   General: Weight gain [ y]; Weight loss [ ] ; Anorexia [ ] ; Fatigue [ y]; Fever [ ] ; Chills [ ] ; Weakness Blue.Reese ]  Cardiac: Chest pain/pressure [ ] ; Resting SOB Blue.Reese ]; Exertional SOB  [ y]; Orthopnea [ y]; Pedal Edema [ y]; Palpitations [ ] ; Syncope [ ] ; Presyncope [ ] ; Paroxysmal nocturnal dyspnea[ ]   Pulmonary: Cough Blue.Reese ]; [facility] ]; Hemoptysis[ ] ; Sputum [ ] ; Snoring [ ]   GI: Vomiting[ ] ; Dysphagia[ ] ; Melena[ ] ; Hematochezia [ ] ; Heartburn[ ] ; Abdominal pain [ ] ; Constipation [ ] ; Diarrhea [ ] ; BRBPR [ ]   GU: Hematuria[ ] ; Dysuria [ ] ; Nocturia[ ]   Vascular: Pain in legs with walking [ ] ; Pain in feet with lying flat [ ] ; Non-healing sores [ ] ; Stroke [ ] ; TIA [ ] ; Slurred speech [ ] ;  Neuro: Headaches[ ] ; Vertigo[ ] ; Seizures[ ] ; Paresthesias[ ] ;Blurred vision [ ] ; Diplopia [ ] ; Vision changes [ ]   Ortho/Skin: Arthritis Blue.Reese ]; Joint pain Blue.Reese ]; Muscle pain [ ] ; Joint swelling [ ] ; Back Pain [ ] ; Rash [ ]   Psych: Depression[y ]; Anxiety[ y]  Heme: Bleeding problems [ ] ; Clotting disorders [ ] ; Anemia [ ]   Endocrine: Diabetes [ y]; Thyroid dysfunction[ ]   Home Medications Prior to Admission medications   Medication Sig Start Date End Date Taking? Authorizing Provider  albuterol (PROVENTIL HFA;VENTOLIN HFA) 108 (90 Base) MCG/ACT inhaler Inhale 2 puffs into the lungs every 6 (six) hours as needed for wheezing or shortness of breath.   Yes Historical Provider, MD  ALPRAZolam (XANAX) 0.25 MG tablet Take 1 tablet (0.25 mg total) by mouth 2 (two) times daily.  04/20/16  Yes Orson Eva, MD  amiodarone (PACERONE) 200 MG tablet Take 1 tablet (200 mg total) by mouth 2 (two) times daily. 07/17/16  Yes Shirley Friar, PA-C  apixaban (ELIQUIS) 5 MG TABS tablet Take 1 tablet (5 mg total) by mouth 2 (two) times daily. 06/12/16  Yes Larey Dresser, MD  atorvastatin (LIPITOR) 20 MG tablet Take 20 mg by mouth daily.   Yes Historical Provider, MD  citalopram (CELEXA) 20 MG tablet Take 20 mg by mouth every morning.  07/04/13  Yes Historical Provider, MD  clopidogrel (PLAVIX) 75 MG tablet Take 1 tablet (75 mg total) by mouth daily with breakfast. 05/10/16  Yes Arbutus Leas, NP  diltiazem  (CARDIZEM CD) 240 MG 24 hr capsule Take 1 capsule (240 mg total) by mouth daily. 04/20/16  Yes David Tat, MD  fluticasone furoate-vilanterol (BREO ELLIPTA) 100-25 MCG/INH AEPB Inhale 1 puff into the lungs daily. 03/09/16  Yes Lucia Gaskins, MD  gabapentin (NEURONTIN) 300 MG capsule Take 300 mg by mouth 2 (two) times daily.    Yes Historical Provider, MD  insulin aspart (NOVOLOG) 100 UNIT/ML injection Inject 10 Units into the skin 3 (three) times daily before meals.    Yes Historical Provider, MD  Insulin Glargine (LANTUS SOLOSTAR) 100 UNIT/ML Solostar Pen Inject 25 Units into the skin daily at 10 pm.   Yes Historical Provider, MD  ipratropium-albuterol (DUONEB) 0.5-2.5 (3) MG/3ML SOLN Take 3 mLs by nebulization every 6 (six) hours as needed (wheezing).    Yes Historical Provider, MD  metFORMIN (GLUCOPHAGE) 500 MG tablet Take 500 mg by mouth 2 (two) times daily with a meal.   Yes Historical Provider, MD  oxyCODONE-acetaminophen (PERCOCET) 10-325 MG tablet Take 1 tablet by mouth every 4 (four) hours as needed for pain.   Yes Historical Provider, MD  pantoprazole (PROTONIX) 40 MG tablet Take 40 mg by mouth every morning.    Yes Historical Provider, MD  sitaGLIPtin (JANUVIA) 100 MG tablet Take 100 mg by mouth every morning.   Yes Historical Provider, MD  spironolactone (ALDACTONE) 25 MG tablet Take 0.5 tablets (12.5 mg total) by mouth daily. 04/20/16  Yes Orson Eva, MD  torsemide (DEMADEX) 20 MG tablet Take 2 tablets (40 mg total) by mouth daily. 06/04/16  Yes Shirley Friar, PA-C    Past Medical History: Past Medical History:  Diagnosis Date  . Anxiety   . Arthritis    "back" (10/30/2015)  . CHF (congestive heart failure) ([place])   . Chronic lower back pain   . COPD (chronic obstructive pulmonary disease) ([place])   . GERD (gastroesophageal reflux disease)   . Hyperlipidemia   . Hypertension   . Hypoxia   . Peripheral vascular disease ([place])   . Shingles Dec. 2016   Scalp  . Stroke ([place])     . Type II diabetes mellitus ([place])     Past Surgical History: Past Surgical History:  Procedure Laterality Date  . AMPUTATION Right 10/30/2015   Procedure: RIGHT ABOVE KNEE AMPUTATION;  Surgeon: Elam Dutch, MD;  Location: [place];  Service: Vascular;  Laterality: Right;  . BACK SURGERY    . CARDIAC CATHETERIZATION    . CARDIAC CATHETERIZATION N/A 04/18/2016   Procedure: Right/Left Heart Cath and Coronary Angiography;  Surgeon: Jolaine Artist, MD;  Location: [place] CV LAB;  Service: Cardiovascular;  Laterality: N/A;  . CARDIAC CATHETERIZATION N/A 04/18/2016   Procedure: Coronary Stent Intervention;  Surgeon: Belva Crome, MD;  Location: [place]  CV LAB;  Service: Cardiovascular;  Laterality: N/A;  . CARDIOVERSION N/A 06/14/2016   Procedure: CARDIOVERSION;  Surgeon: Jolaine Artist, MD;  [facility] ENDOSCOPY;  Service: Cardiovascular;  Laterality: N/A;  . CAROTID ENDARTERECTOMY Right 2010   Dr Kellie Simmering   . CAROTID ENDARTERECTOMY Left 03/2008   Archie Endo 01/15/2011  . FASCIOTOMY Right 04/10/2015   Procedure: FOUR COMPARTMENT FASCIOTOMY;  Surgeon: Elam Dutch, MD;  Location: [place];  Service: Vascular;  Laterality: Right;  . FEMORAL-POPLITEAL BYPASS GRAFT Right 04/10/2015   Procedure: RIGHT  FEMORAL-BELOW THE KNEE POPLITEAL ARTERY BYPASS GRAFT USING 6 MM X 80 CM PROPATEN GRAFT;  Surgeon: Elam Dutch, MD;  Location: [place];  Service: Vascular;  Laterality: Right;  . FOOT SURGERY Bilateral    "from standing on concrete all day"  . INTRAOPERATIVE ARTERIOGRAM Right 04/10/2015   Procedure: INTRA OPERATIVE ARTERIOGRAM;  Surgeon: Elam Dutch, MD;  Location: [place];  Service: Vascular;  Laterality: Right;  . LAPAROSCOPIC CHOLECYSTECTOMY    . LEG AMPUTATION THROUGH KNEE Right 10/30/2015  . PERIPHERAL VASCULAR CATHETERIZATION N/A 04/10/2015   Procedure: Abdominal Aortogram w/Lower Extremity;  Surgeon: Conrad Longtown, MD;  Location: [place] CV LAB;  Service: Cardiovascular;   Laterality: N/A;  . PERIPHERAL VASCULAR CATHETERIZATION N/A 10/20/2015   Procedure: Abdominal Aortogram;  Surgeon: Elam Dutch, MD;  Location: [place] CV LAB;  Service: Cardiovascular;  Laterality: N/A;  . POSTERIOR LUMBAR FUSION    . SHOULDER OPEN ROTATOR CUFF REPAIR Right 02/2006   Archie Endo 01/29/2011  . THROMBECTOMY FEMORAL ARTERY Right 04/10/2015   Procedure: THROMBECTOMY RIGHT TIBIAL  ARTERY WITH VEIN PATCH ANGIOPLASTY;  Surgeon: Elam Dutch, MD;  [facility] OR;  Service: Vascular;  Laterality: Right;    Family History: Family History  Problem Relation Age of Onset  . Cancer Father   . Heart attack Sister   . Stroke Sister   . Hypertension Sister   . Hypertension Brother     Social History: Social History   Social History  . Marital status: Married    Spouse name: N/A  . Number of children: N/A  . Years of education: N/A   Social History Main Topics  . Smoking status: Former Smoker    Packs/day: 0.12    Years: 49.00    Types: Cigarettes  . Smokeless tobacco: Never Used  . Alcohol use No  . Drug use: No  . Sexual activity: No   Other Topics Concern  . None   Social History Narrative  . None    Allergies:  Allergies  Allergen Reactions  . Codeine Rash  . Penicillins Other (See Comments)    Has patient had a PCN reaction causing immediate rash, facial/tongue/throat swelling, SOB or lightheadedness with hypotension: unknown Has patient had a PCN reaction causing severe rash involving mucus membranes or skin necrosis: unknown Has patient had a PCN reaction that required hospitalization unknown Has patient had a PCN reaction occurring within the last 10 years: unknown If all of the above answers are "NO", then may proceed with Cephalosporin use.  . Ciprofloxacin Hives    Objective:    Vital Signs:   Pulse Rate:  [76] 76 (11/06 0958) Resp:  [14] 14 (11/06 0958) BP: (143)/(51) 143/51 (11/06 0958) SpO2:  [97 %] 97 % (11/06 0958) Weight:  [74.4 kg  (164 lb)] 74.4 kg (164 lb) (11/06 0958)   Filed Weights   07/22/16 0958  Weight: 74.4 kg (164 lb)    Physical Exam:  General:  Chronically ill appearing. SOB with any movement  On [place] HEENT: normal Neck: supple. JVP 10 . Carotids 2+ bilat; no bruits. No lymphadenopathy or thryomegaly appreciated. Cor: PMI nondisplaced. Irregular rate & rhythm. Distant Lungs: Decreased BS throughout. Basilar crackles. Minimal wheeze Abdomen: obese soft, nontender, nondistended. No hepatosplenomegaly. No bruits or masses. Good bowel sounds. Extremities: no cyanosis, clubbing, rash, R BKA  3+ edema on L Neuro: alert & orientedx3, cranial nerves grossly intact. moves all 4 extremities w/o difficulty. Affect pleasant  Telemetry: AFL 60-70s  Labs: Basic Metabolic Panel:  Recent Labs Lab 07/17/16 1223  NA 137  K 3.9  CL 94*  CO2 34*  GLUCOSE 125*  BUN 15  CREATININE 1.11*  CALCIUM 9.8    Liver Function Tests: No results for input(s): AST, ALT, ALKPHOS, BILITOT, PROT, ALBUMIN in the last 168 hours. No results for input(s): LIPASE, AMYLASE in the last 168 hours. No results for input(s): AMMONIA in the last 168 hours.  CBC: No results for input(s): WBC, NEUTROABS, HGB, HCT, MCV, PLT in the last 168 hours.  Cardiac Enzymes: No results for input(s): CKTOTAL, CKMB, CKMBINDEX, TROPONINI in the last 168 hours.  BNP: BNP (last 3 results)  Recent Labs  03/06/16 2134 04/08/16 1531 05/13/16 2030  BNP 653.0* 920.0* 240.0*    ProBNP (last 3 results) No results for input(s): PROBNP in the last 8760 hours.   CBG: No results for input(s): GLUCAP in the last 168 hours.  Coagulation Studies: No results for input(s): LABPROT, INR in the last 72 hours.  Other results: EKG: atrial flutter  Imaging:  No results found.      Assessment:   1. Acute on chronic diastolic HF 2. Atrial flutter, paroxysmal    --CHADSVASC 5  3. Acute on chronic respiratory failure 4. PAD s/p R BKA 5.  Severe COPD 6. DM2 7. CAD    --s/p LAD stent in 8/17  Plan/Discussion:    She presents today for elective DC-CV of paroxysmal AFL. On arrival noted to be significantly volume overloaded with progressive respiratory distress. Sats down to 60% when taken off O2 for transport from wheelchair to bed. Will admit for IV diuresis. Plan DC-CV prior to d/c when respiratory status more stable. Continue Plavix and apixaban. Increase amio to 200 bid. Long-term might consider switch to Tikosyn given severe COPD but would require amio washout. Will d/w with EP.    Cover DM2 with lantus and SSI.   She denies tobacco use but smells heavily like smoke. Check cotinine level.    Length of Stay: 0   Glori Bickers MD 07/22/2016, 10:31 AM  Advanced Heart Failure Team Pager [phone] (M-F; 7a - 4p)  Please contact [facility] for night-coverage after hours (4p -7a ) and weekends on [email or web address]

## 2016-07-23 ENCOUNTER — Inpatient Hospital Stay (HOSPITAL_COMMUNITY): Payer: Medicare Other

## 2016-07-23 LAB — BASIC METABOLIC PANEL
ANION GAP: 14 (ref 5–15)
BUN: 21 mg/dL — ABNORMAL HIGH (ref 6–20)
CALCIUM: 9.5 mg/dL (ref 8.9–10.3)
CO2: 32 mmol/L (ref 22–32)
CREATININE: 1.26 mg/dL — AB (ref 0.44–1.00)
Chloride: 89 mmol/L — ABNORMAL LOW (ref 101–111)
GFR, EST AFRICAN AMERICAN: 50 mL/min — AB (ref >60)
GFR, EST NON AFRICAN AMERICAN: 43 mL/min — AB (ref >60)
Glucose, Bld: 94 mg/dL (ref 65–99)
Potassium: 4.1 mmol/L (ref 3.5–5.1)
SODIUM: 135 mmol/L (ref 135–145)

## 2016-07-23 LAB — GLUCOSE, CAPILLARY
GLUCOSE-CAPILLARY: 93 mg/dL (ref 65–99)
GLUCOSE-CAPILLARY: 158 mg/dL — AB (ref 65–99)
Glucose-Capillary: 149 mg/dL — ABNORMAL HIGH (ref 65–99)
Glucose-Capillary: 173 mg/dL — ABNORMAL HIGH (ref 65–99)

## 2016-07-23 LAB — MAGNESIUM: Magnesium: 2 mg/dL (ref 1.7–2.4)

## 2016-07-23 MED ORDER — SODIUM CHLORIDE 0.9 % IV SOLN: 250.0000 mL | INTRAVENOUS | Status: DC

## 2016-07-23 MED ORDER — SODIUM CHLORIDE 0.9% FLUSH: 3.0000 mL | Freq: Two times a day (BID) | INTRAVENOUS | Status: DC

## 2016-07-23 MED ORDER — SODIUM CHLORIDE 0.9% FLUSH: 3.0000 mL | INTRAVENOUS | Status: DC | PRN

## 2016-07-23 NOTE — Care Management Note (Signed)
Case Management Note  Patient Details  Name: [name] MRN: [id number] Date of Birth: [birth year]  Subjective/Objective:  Pt presented from Endo 07-22-16 with volume overload. Pt was to have an elective cardioversion. Pt initiated on IV Lasix bid. Per MD notes: possible DCCV 07-24-16 if volume status improved. Hx R AKA, CHF, COPD. Pt has 02 2 L via [employer]. Pt has DME Hospital Bed, RW and Prosthesis for AKA. Pt is from home with husband and son and they provide transportation to MD visits. Pt had a previous stay at the [facility] in [place] and was d/c home without [place] due to refusal of patient. Once stable pt will benefit from PT/OT Consult.                 Action/Plan: CM will continue to monitor for additional needs. CSW to follow as well.   Expected Discharge Date:                  Expected Discharge Plan:  Skilled Nursing Facility  In-House Referral:  Clinical Social Work  Discharge planning Services  CM Consult  Post Acute Care Choice:    Choice offered to:     DME Arranged:    DME Agency:     HH Arranged:    [place] Agency:     Status of Service:  In process, will continue to follow  If discussed at Long Length of Stay Meetings, dates discussed:    Additional Comments:  Bethena Roys, RN 07/23/2016, 12:47 PM

## 2016-07-23 NOTE — Progress Notes (Signed)
Advanced Heart Failure Rounding Note  PCP: Lucia Gaskins, MD Primary Cardiologist: Dr. Acie Fredrickson Primary HF: Dr. Haroldine Laws   Subjective:    Admitted from Endo yesterday with volume overload.  DCCV deferred.   Feeling somewhat better.   Out 1.3 L so far.  Weight shows down 7 lbs.   Objective:   Weight Range: 172 lb 6.4 oz (78.2 kg) Body mass index is 27.83 kg/m.   Vital Signs:   Temp:  [98.2 F (36.8 C)-98.6 F (37 C)] 98.2 F (36.8 C) (11/07 0837) Pulse Rate:  [61-76] 67 (11/07 0821) Resp:  [10-18] 13 (11/07 0821) BP: (96-143)/(46-70) 132/58 (11/07 0821) SpO2:  [92 %-98 %] 98 % (11/07 0821) Weight:  [164 lb (74.4 kg)-179 lb 8 oz (81.4 kg)] 172 lb 6.4 oz (78.2 kg) (11/07 0619) Last BM Date: 07/22/16  Weight change: Filed Weights   07/22/16 0958 07/22/16 1147 07/23/16 0619  Weight: 164 lb (74.4 kg) 179 lb 8 oz (81.4 kg) 172 lb 6.4 oz (78.2 kg)    Intake/Output:   Intake/Output Summary (Last 24 hours) at 07/23/16 0907 Last data filed at 07/23/16 0837  Gross per 24 hour  Intake           663.33 ml  Output             2000 ml  Net         -1336.67 ml     Physical Exam: General:  Fatigued appearing. NAD at rest.  HEENT: normal Neck: supple. JVP 8-9. Carotids 2+ bilat; no bruits. No thyromegaly or nodule noted.  Cor: PMI nondisplaced. Irregular rate and rhythm. Distant Lungs: Diminished throughout. Basilar crackles. Scant wheezing Abdomen: soft, NT, ND, no HSM. No bruits or masses. +BS  Extremities: no cyanosis, clubbing, rash, 2+ BLE edema.  Neuro: alert & orientedx3, cranial nerves grossly intact. moves all 4 extremities w/o difficulty. Affect pleasant   Telemetry: Reviewed, remains in AFL 60-70s  Labs: CBC  Recent Labs  07/22/16 1244  WBC 11.0*  NEUTROABS 9.2*  HGB 8.7*  HCT 28.2*  MCV 78.1  PLT [id number]   Basic Metabolic Panel  Recent Labs  07/22/16 1244 07/23/16 0650  NA 129* 135  K 5.1 4.1  CL 85* 89*  CO2 32 32  GLUCOSE 130* 94    BUN 23* 21*  CREATININE 1.36* 1.26*  CALCIUM 10.0 9.5  MG 1.5* 2.0   Liver Function Tests  Recent Labs  07/22/16 1244  AST 20  ALT 10*  ALKPHOS 84  BILITOT 0.8  PROT 7.0  ALBUMIN 4.1   No results for input(s): LIPASE, AMYLASE in the last 72 hours. Cardiac Enzymes No results for input(s): CKTOTAL, CKMB, CKMBINDEX, TROPONINI in the last 72 hours.  BNP: BNP (last 3 results)  Recent Labs  04/08/16 1531 05/13/16 2030 07/22/16 1244  BNP 920.0* 240.0* 326.0*    ProBNP (last 3 results) No results for input(s): PROBNP in the last 8760 hours.   D-Dimer No results for input(s): DDIMER in the last 72 hours. Hemoglobin A1C No results for input(s): HGBA1C in the last 72 hours. Fasting Lipid Panel No results for input(s): CHOL, HDL, LDLCALC, TRIG, CHOLHDL, LDLDIRECT in the last 72 hours. Thyroid Function Tests No results for input(s): TSH, T4TOTAL, T3FREE, THYROIDAB in the last 72 hours.  Invalid input(s): FREET3  Other results:     Imaging/Studies:   No results found.  Latest Echo  Latest Cath   Medications:     Scheduled Medications: . ALPRAZolam  0.25 mg Oral BID  . amiodarone  200 mg Oral BID  . apixaban  5 mg Oral BID  . atorvastatin  20 mg Oral Daily  . Chlorhexidine Gluconate Cloth  6 each Topical Q0600  . citalopram  20 mg Oral q morning - 10a  . clopidogrel  75 mg Oral Q breakfast  . diltiazem  240 mg Oral Daily  . fluticasone furoate-vilanterol  1 puff Inhalation Daily  . furosemide  80 mg Intravenous BID  . gabapentin  300 mg Oral BID  . insulin aspart  0-15 Units Subcutaneous TID WC  . insulin aspart  0-5 Units Subcutaneous QHS  . insulin glargine  15 Units Subcutaneous Q2200  . linagliptin  5 mg Oral Daily  . metFORMIN  500 mg Oral BID WC  . mupirocin ointment  1 application Nasal BID  . pantoprazole  40 mg Oral q morning - 10a  . sodium chloride flush  3 mL Intravenous Q12H  . sodium chloride flush  3 mL Intravenous Q12H  .  spironolactone  12.5 mg Oral Daily     Infusions: . sodium chloride 10 mL/hr at 07/22/16 1528  . sodium chloride       PRN Medications:  sodium chloride, acetaminophen, albuterol, hydrocortisone cream, ipratropium-albuterol, ondansetron (ZOFRAN) IV, oxyCODONE-acetaminophen **AND** oxyCODONE, sodium chloride flush, sodium chloride flush   Assessment/Plan   [name] is a 65 y.o. female with hx of Diastolic CHF, Paroxysmal Aflutter, PAD, COPD, DM2, and CAD who presented for DCCV 07/22/16. This was deferred and she was admitted for diuresis with volume overload.   1. Acute on chronic diastolic HF - Remains volume overloaded. Continue lasix 80 mg IV BID for now.  2. Atrial flutter, paroxysmal    --CHADSVASC 5  - Can eat today with ongoing volume. Will plan for DCCV tomorrow.  - Continue eliquis.  3. Acute on chronic respiratory failure - Improved with diuresis.  4. PAD s/p R BKA - Stable.  5. Severe COPD - Continue home meds 6. DM2 - SSI while in house.  7. CAD    --s/p LAD stent in 8/17 - No ACS. No ASA with stable disease on eliquis.   Remains volume overloaded. Continue eliquis. Continue IV lasix today. Plan for DCCV tomorrow.   Length of Stay: 1  Shirley Friar PA-C 07/23/2016, 9:07 AM  Advanced Heart Failure Team Pager [phone] (M-F; 7a - 4p)  Please contact [facility] for night-coverage after hours (4p -7a ) and weekends on [email or web address]  Patient seen and examined with Oda Kilts, PA-C. We discussed all aspects of the encounter. I agree with the assessment and plan as stated above.   Volume status improving but still volume overloaded. Breathing better. Renal function stable. Continue IV diuresis one more day. Plan DC-CV tomorrow. Continue Eliquis.   Fransico Sciandra,MD 11:26 AM

## 2016-07-24 ENCOUNTER — Encounter (HOSPITAL_COMMUNITY)
Admission: RE | Disposition: A | Discharge status: Home / Self Care | Payer: Self-pay | Source: Ambulatory Visit | Attending: Internal Medicine

## 2016-07-24 ENCOUNTER — Inpatient Hospital Stay (HOSPITAL_COMMUNITY): Payer: Medicare Other | Admitting: Certified Registered Nurse Anesthetist

## 2016-07-24 ENCOUNTER — Encounter (HOSPITAL_COMMUNITY): Payer: Self-pay

## 2016-07-24 DIAGNOSIS — I4891 Unspecified atrial fibrillation: Secondary | ICD-10-CM

## 2016-07-24 HISTORY — PX: CARDIOVERSION: SHX1299

## 2016-07-24 LAB — GLUCOSE, CAPILLARY: GLUCOSE-CAPILLARY: 131 mg/dL — AB (ref 65–99)

## 2016-07-24 LAB — BASIC METABOLIC PANEL
ANION GAP: 8 (ref 5–15)
BUN: 23 mg/dL — ABNORMAL HIGH (ref 6–20)
CHLORIDE: 90 mmol/L — AB (ref 101–111)
CO2: 37 mmol/L — AB (ref 22–32)
CREATININE: 1.46 mg/dL — AB (ref 0.44–1.00)
Calcium: 9.4 mg/dL (ref 8.9–10.3)
GFR calc non Af Amer: 36 mL/min — ABNORMAL LOW (ref >60)
GFR, EST AFRICAN AMERICAN: 42 mL/min — AB (ref >60)
Glucose, Bld: 119 mg/dL — ABNORMAL HIGH (ref 65–99)
POTASSIUM: 3.8 mmol/L (ref 3.5–5.1)
SODIUM: 135 mmol/L (ref 135–145)

## 2016-07-24 LAB — NICOTINE/COTININE METABOLITES
COTININE: 1.3 ng/mL
Nicotine: NOT DETECTED ng/mL

## 2016-07-24 SURGERY — CARDIOVERSION
Anesthesia: General

## 2016-07-24 MED ORDER — CLOPIDOGREL BISULFATE 75 MG PO TABS
75.0000 mg | ORAL_TABLET | Freq: Every day | ORAL | Status: DC
  Administered 2016-07-24: 75 mg via ORAL
  Filled 2016-07-24: qty 1

## 2016-07-24 MED ORDER — DILTIAZEM HCL ER COATED BEADS 240 MG PO CP24
240.0000 mg | ORAL_CAPSULE | Freq: Every day | ORAL | Status: DC
  Administered 2016-07-24: 240 mg via ORAL
  Filled 2016-07-24: qty 1

## 2016-07-24 MED ORDER — OXYCODONE HCL 5 MG PO TABS: 5.0000 mg | ORAL_TABLET | ORAL | Status: DC | PRN

## 2016-07-24 MED ORDER — FLUTICASONE FUROATE-VILANTEROL 100-25 MCG/INH IN AEPB
1.0000 | INHALATION_SPRAY | Freq: Every day | RESPIRATORY_TRACT | Status: DC
  Filled 2016-07-24: qty 28

## 2016-07-24 MED ORDER — PANTOPRAZOLE SODIUM 40 MG PO TBEC
40.0000 mg | DELAYED_RELEASE_TABLET | Freq: Every morning | ORAL | Status: DC
  Administered 2016-07-24: 40 mg via ORAL
  Filled 2016-07-24: qty 1

## 2016-07-24 MED ORDER — CITALOPRAM HYDROBROMIDE 20 MG PO TABS
20.0000 mg | ORAL_TABLET | Freq: Every morning | ORAL | Status: DC
  Administered 2016-07-24: 20 mg via ORAL
  Filled 2016-07-24: qty 1

## 2016-07-24 MED ORDER — INSULIN ASPART 100 UNIT/ML ~~LOC~~ SOLN: 10.0000 [IU] | Freq: Three times a day (TID) | SUBCUTANEOUS | Status: DC | PRN

## 2016-07-24 MED ORDER — SPIRONOLACTONE 25 MG PO TABS
12.5000 mg | ORAL_TABLET | Freq: Every day | ORAL | Status: DC
  Administered 2016-07-24: 12.5 mg via ORAL
  Filled 2016-07-24: qty 1

## 2016-07-24 MED ORDER — GABAPENTIN 600 MG PO TABS: 600.0000 mg | ORAL_TABLET | Freq: Three times a day (TID) | ORAL | Status: DC

## 2016-07-24 MED ORDER — TORSEMIDE 20 MG PO TABS: 40.0000 mg | ORAL_TABLET | Freq: Every day | ORAL | Status: DC

## 2016-07-24 MED ORDER — ALPRAZOLAM 0.25 MG PO TABS
0.2500 mg | ORAL_TABLET | Freq: Two times a day (BID) | ORAL | Status: DC
  Administered 2016-07-24: 0.25 mg via ORAL
  Filled 2016-07-24: qty 1

## 2016-07-24 MED ORDER — LIDOCAINE 2% (20 MG/ML) 5 ML SYRINGE
INTRAMUSCULAR | Status: DC | PRN
  Administered 2016-07-24: 80 mg via INTRAVENOUS

## 2016-07-24 MED ORDER — SPIRONOLACTONE 25 MG PO TABS: 12.5000 mg | ORAL_TABLET | Freq: Every day | ORAL | 0 refills | Status: AC

## 2016-07-24 MED ORDER — APIXABAN 5 MG PO TABS: 5.0000 mg | ORAL_TABLET | Freq: Two times a day (BID) | ORAL | Status: DC

## 2016-07-24 MED ORDER — PHENYLEPHRINE HCL 10 MG/ML IJ SOLN
INTRAMUSCULAR | Status: DC | PRN
  Administered 2016-07-24: 80 ug via INTRAVENOUS

## 2016-07-24 MED ORDER — OXYCODONE-ACETAMINOPHEN 5-325 MG PO TABS: 1.0000 | ORAL_TABLET | ORAL | Status: DC | PRN

## 2016-07-24 MED ORDER — OXYCODONE-ACETAMINOPHEN 10-325 MG PO TABS: 1.0000 | ORAL_TABLET | Freq: Four times a day (QID) | ORAL | Status: DC | PRN

## 2016-07-24 MED ORDER — AMIODARONE HCL 200 MG PO TABS
200.0000 mg | ORAL_TABLET | Freq: Two times a day (BID) | ORAL | Status: DC
  Administered 2016-07-24: 200 mg via ORAL
  Filled 2016-07-24: qty 1

## 2016-07-24 MED ORDER — ATORVASTATIN CALCIUM 20 MG PO TABS
20.0000 mg | ORAL_TABLET | Freq: Every day | ORAL | Status: DC
Start: 2016-07-24 — End: 2016-07-24
  Administered 2016-07-24: 20 mg via ORAL
  Filled 2016-07-24: qty 1

## 2016-07-24 MED ORDER — IPRATROPIUM-ALBUTEROL 0.5-2.5 (3) MG/3ML IN SOLN: 3.0000 mL | Freq: Four times a day (QID) | RESPIRATORY_TRACT | Status: DC | PRN

## 2016-07-24 MED ORDER — LINAGLIPTIN 5 MG PO TABS
5.0000 mg | ORAL_TABLET | Freq: Every day | ORAL | Status: DC
  Administered 2016-07-24: 5 mg via ORAL
  Filled 2016-07-24: qty 1

## 2016-07-24 MED ORDER — PROPOFOL 10 MG/ML IV BOLUS
INTRAVENOUS | Status: DC | PRN
  Administered 2016-07-24: 50 mg via INTRAVENOUS
  Administered 2016-07-24: 30 mg via INTRAVENOUS

## 2016-07-24 MED ORDER — CLOPIDOGREL BISULFATE 75 MG PO TABS: 75.0000 mg | ORAL_TABLET | Freq: Every day | ORAL | Status: DC

## 2016-07-24 MED ORDER — ALBUTEROL SULFATE (2.5 MG/3ML) 0.083% IN NEBU: 3.0000 mL | INHALATION_SOLUTION | Freq: Four times a day (QID) | RESPIRATORY_TRACT | Status: DC | PRN

## 2016-07-24 MED ORDER — INSULIN GLARGINE 100 UNIT/ML ~~LOC~~ SOLN: 25.0000 [IU] | Freq: Every day | SUBCUTANEOUS | Status: DC

## 2016-07-24 MED ORDER — METFORMIN HCL 500 MG PO TABS: 500.0000 mg | ORAL_TABLET | Freq: Two times a day (BID) | ORAL | Status: DC

## 2016-07-24 NOTE — Anesthesia Preprocedure Evaluation (Signed)
Anesthesia Evaluation  Patient identified by MRN, date of birth, ID band Patient awake    Reviewed: Allergy & Precautions, NPO status , Patient's Chart, lab work & pertinent test results  History of Anesthesia Complications Negative for: history of anesthetic complications  Airway Mallampati: II       Dental  (+) Edentulous Upper, Edentulous Lower   Pulmonary sleep apnea , COPD, former smoker,    breath sounds clear to auscultation       Cardiovascular hypertension, + CAD and + Peripheral Vascular Disease   Rhythm:Irregular Rate:Normal     Neuro/Psych Anxiety CVA    GI/Hepatic Neg liver ROS, GERD  ,  Endo/Other  diabetes  Renal/GU negative Renal ROS     Musculoskeletal   Abdominal   Peds  Hematology   Anesthesia Other Findings   Reproductive/Obstetrics                             Anesthesia Physical  Anesthesia Plan  ASA: III  Anesthesia Plan: General   Post-op Pain Management:    Induction: Intravenous  Airway Management Planned: Mask  Additional Equipment:   Intra-op Plan:   Post-operative Plan:   Informed Consent: I have reviewed the patients History and Physical, chart, labs and discussed the procedure including the risks, benefits and alternatives for the proposed anesthesia with the patient or authorized representative who has indicated his/her understanding and acceptance.   Dental advisory given  Plan Discussed with: CRNA and Anesthesiologist  Anesthesia Plan Comments:         Anesthesia Quick Evaluation

## 2016-07-24 NOTE — Interval H&P Note (Signed)
History and Physical Interval Note:  07/24/2016 8:30 AM  [name]  has presented today for surgery, with the diagnosis of atrial flutter  The various methods of treatment have been discussed with the patient and family. After consideration of risks, benefits and other options for treatment, the patient has consented to  Procedure(s): CARDIOVERSION (N/A) as a surgical intervention .  The patient's history has been reviewed, patient examined, no change in status, stable for surgery.  I have reviewed the patient's chart and labs.  Questions were answered to the patient's satisfaction.     Marcos Peloso, Quillian Quince

## 2016-07-24 NOTE — CV Procedure (Signed)
     DIRECT CURRENT CARDIOVERSION  NAME:  [name]   MRN: [id number] DOB:  05-11-1951   ADMIT DATE: 07/22/2016   INDICATIONS: Atrial fibrillation    PROCEDURE:   Informed consent was obtained prior to the procedure. The risks, benefits and alternatives for the procedure were discussed and the patient comprehended these risks. Once an appropriate time out was taken, the patient had the defibrillator pads placed in the anterior and posterior position. The patient then underwent sedation by the anesthesia service. Once an appropriate level of sedation was achieved, the patient received a single biphasic, synchronized 200J shock with prompt conversion to sinus rhythm. No apparent complications.  Lanijah Warzecha,MD 8:31 AM

## 2016-07-24 NOTE — H&P (View-Only) (Signed)
Advanced Heart Failure Rounding Note  PCP: Lucia Gaskins, MD Primary Cardiologist: Dr. Acie Fredrickson Primary HF: Dr. Haroldine Laws   Subjective:    Admitted from Endo yesterday with volume overload.  DCCV deferred.   Feeling somewhat better.   Out 1.3 L so far.  Weight shows down 7 lbs.   Objective:   Weight Range: 172 lb 6.4 oz (78.2 kg) Body mass index is 27.83 kg/m.   Vital Signs:   Temp:  [98.2 F (36.8 C)-98.6 F (37 C)] 98.2 F (36.8 C) (11/07 0837) Pulse Rate:  [61-76] 67 (11/07 0821) Resp:  [10-18] 13 (11/07 0821) BP: (96-143)/(46-70) 132/58 (11/07 0821) SpO2:  [92 %-98 %] 98 % (11/07 0821) Weight:  [164 lb (74.4 kg)-179 lb 8 oz (81.4 kg)] 172 lb 6.4 oz (78.2 kg) (11/07 0619) Last BM Date: 07/22/16  Weight change: Filed Weights   07/22/16 0958 07/22/16 1147 07/23/16 0619  Weight: 164 lb (74.4 kg) 179 lb 8 oz (81.4 kg) 172 lb 6.4 oz (78.2 kg)    Intake/Output:   Intake/Output Summary (Last 24 hours) at 07/23/16 0907 Last data filed at 07/23/16 0837  Gross per 24 hour  Intake           663.33 ml  Output             2000 ml  Net         -1336.67 ml     Physical Exam: General:  Fatigued appearing. NAD at rest.  HEENT: normal Neck: supple. JVP 8-9. Carotids 2+ bilat; no bruits. No thyromegaly or nodule noted.  Cor: PMI nondisplaced. Irregular rate and rhythm. Distant Lungs: Diminished throughout. Basilar crackles. Scant wheezing Abdomen: soft, NT, ND, no HSM. No bruits or masses. +BS  Extremities: no cyanosis, clubbing, rash, 2+ BLE edema.  Neuro: alert & orientedx3, cranial nerves grossly intact. moves all 4 extremities w/o difficulty. Affect pleasant   Telemetry: Reviewed, remains in AFL 60-70s  Labs: CBC  Recent Labs  07/22/16 1244  WBC 11.0*  NEUTROABS 9.2*  HGB 8.7*  HCT 28.2*  MCV 78.1  PLT [id number]   Basic Metabolic Panel  Recent Labs  07/22/16 1244 07/23/16 0650  NA 129* 135  K 5.1 4.1  CL 85* 89*  CO2 32 32  GLUCOSE 130* 94    BUN 23* 21*  CREATININE 1.36* 1.26*  CALCIUM 10.0 9.5  MG 1.5* 2.0   Liver Function Tests  Recent Labs  07/22/16 1244  AST 20  ALT 10*  ALKPHOS 84  BILITOT 0.8  PROT 7.0  ALBUMIN 4.1   No results for input(s): LIPASE, AMYLASE in the last 72 hours. Cardiac Enzymes No results for input(s): CKTOTAL, CKMB, CKMBINDEX, TROPONINI in the last 72 hours.  BNP: BNP (last 3 results)  Recent Labs  04/08/16 1531 05/13/16 2030 07/22/16 1244  BNP 920.0* 240.0* 326.0*    ProBNP (last 3 results) No results for input(s): PROBNP in the last 8760 hours.   D-Dimer No results for input(s): DDIMER in the last 72 hours. Hemoglobin A1C No results for input(s): HGBA1C in the last 72 hours. Fasting Lipid Panel No results for input(s): CHOL, HDL, LDLCALC, TRIG, CHOLHDL, LDLDIRECT in the last 72 hours. Thyroid Function Tests No results for input(s): TSH, T4TOTAL, T3FREE, THYROIDAB in the last 72 hours.  Invalid input(s): FREET3  Other results:     Imaging/Studies:   No results found.  Latest Echo  Latest Cath   Medications:     Scheduled Medications: . ALPRAZolam  0.25 mg Oral BID  . amiodarone  200 mg Oral BID  . apixaban  5 mg Oral BID  . atorvastatin  20 mg Oral Daily  . Chlorhexidine Gluconate Cloth  6 each Topical Q0600  . citalopram  20 mg Oral q morning - 10a  . clopidogrel  75 mg Oral Q breakfast  . diltiazem  240 mg Oral Daily  . fluticasone furoate-vilanterol  1 puff Inhalation Daily  . furosemide  80 mg Intravenous BID  . gabapentin  300 mg Oral BID  . insulin aspart  0-15 Units Subcutaneous TID WC  . insulin aspart  0-5 Units Subcutaneous QHS  . insulin glargine  15 Units Subcutaneous Q2200  . linagliptin  5 mg Oral Daily  . metFORMIN  500 mg Oral BID WC  . mupirocin ointment  1 application Nasal BID  . pantoprazole  40 mg Oral q morning - 10a  . sodium chloride flush  3 mL Intravenous Q12H  . sodium chloride flush  3 mL Intravenous Q12H  .  spironolactone  12.5 mg Oral Daily     Infusions: . sodium chloride 10 mL/hr at 07/22/16 1528  . sodium chloride       PRN Medications:  sodium chloride, acetaminophen, albuterol, hydrocortisone cream, ipratropium-albuterol, ondansetron (ZOFRAN) IV, oxyCODONE-acetaminophen **AND** oxyCODONE, sodium chloride flush, sodium chloride flush   Assessment/Plan   MALAYIA DEBARI is a 65 y.o. female with hx of Diastolic CHF, Paroxysmal Aflutter, PAD, COPD, DM2, and CAD who presented for DCCV 07/22/16. This was deferred and she was admitted for diuresis with volume overload.   1. Acute on chronic diastolic HF - Remains volume overloaded. Continue lasix 80 mg IV BID for now.  2. Atrial flutter, paroxysmal    --CHADSVASC 5  - Can eat today with ongoing volume. Will plan for DCCV tomorrow.  - Continue eliquis.  3. Acute on chronic respiratory failure - Improved with diuresis.  4. PAD s/p R BKA - Stable.  5. Severe COPD - Continue home meds 6. DM2 - SSI while in house.  7. CAD    --s/p LAD stent in 8/17 - No ACS. No ASA with stable disease on eliquis.   Remains volume overloaded. Continue eliquis. Continue IV lasix today. Plan for DCCV tomorrow.   Length of Stay: 1  Shirley Friar PA-C 07/23/2016, 9:07 AM  Advanced Heart Failure Team Pager [phone] (M-F; 7a - 4p)  Please contact [facility] for night-coverage after hours (4p -7a ) and weekends on [email or web address]  Patient seen and examined with Oda Kilts, PA-C. We discussed all aspects of the encounter. I agree with the assessment and plan as stated above.   Volume status improving but still volume overloaded. Breathing better. Renal function stable. Continue IV diuresis one more day. Plan DC-CV tomorrow. Continue Eliquis.   Doral Ventrella,MD 11:26 AM

## 2016-07-24 NOTE — Anesthesia Postprocedure Evaluation (Signed)
Anesthesia Post Note  Patient: [name]  Procedure(s) Performed: Procedure(s) (LRB): CARDIOVERSION (N/A)  Patient location during evaluation: Endoscopy Anesthesia Type: General Level of consciousness: sedated Pain management: pain level controlled Vital Signs Assessment: post-procedure vital signs reviewed and stable Respiratory status: spontaneous breathing and respiratory function stable Cardiovascular status: stable Anesthetic complications: no    Last Vitals:  Vitals:   07/24/16 0902 07/24/16 0915  BP: (!) 136/53 (!) 126/59  Pulse: 72 76  Resp: 17 16  Temp:  36.9 C    Last Pain:  Vitals:   07/24/16 1000  TempSrc:   PainSc: 0-No pain                 Shakeia Krus DANIEL

## 2016-07-24 NOTE — Progress Notes (Addendum)
Advanced Heart Failure Rounding Note  PCP: Lucia Gaskins, MD Primary Cardiologist: Dr. Acie Fredrickson Primary HF: Dr. Haroldine Laws   Subjective:    Admitted from Endo  with volume overload.  DCCV deferred.   Cardioverted this am. Feeling better. Wants to go home.  Weight down 9 pounds.    Objective:   Weight Range: 77.3 kg (170 lb 8 oz) Body mass index is 27.52 kg/m.   Vital Signs:   Temp:  [98 F (36.7 C)-98.5 F (36.9 C)] 98 F (36.7 C) (11/08 0500) Pulse Rate:  [55-71] 71 (11/08 0840) Resp:  [13-93] 18 (11/08 0840) BP: (77-151)/(37-61) 125/53 (11/08 0839) SpO2:  [34 %-100 %] 98 % (11/08 0840) Weight:  [77.3 kg (170 lb 8 oz)] 77.3 kg (170 lb 8 oz) (11/08 0500) Last BM Date: 07/22/16  Weight change: Filed Weights   07/22/16 1147 07/23/16 0619 07/24/16 0500  Weight: 81.4 kg (179 lb 8 oz) 78.2 kg (172 lb 6.4 oz) 77.3 kg (170 lb 8 oz)    Intake/Output:   Intake/Output Summary (Last 24 hours) at 07/24/16 0844 Last data filed at 07/24/16 0840  Gross per 24 hour  Intake              720 ml  Output             2227 ml  Net            -1507 ml     Physical Exam: General:  Lying in bed NAD at rest.  HEENT: normal Neck: supple. JVP 6-7. Carotids 2+ bilat; no bruits. No thyromegaly or nodule noted.  Cor: PMI nondisplaced. Irregular rate and rhythm. Distant Lungs: Diminished throughout.  Abdomen: soft, NT, ND, no HSM. No bruits or masses. +BS  Extremities: no cyanosis, clubbing, rash, 2+ BLE edema.  Neuro: alert & orientedx3, cranial nerves grossly intact. moves all 4 extremities w/o difficulty. Affect pleasant   Telemetry: Reviewed, remains in AFL 60-70s  Labs: CBC  Recent Labs  07/22/16 1244  WBC 11.0*  NEUTROABS 9.2*  HGB 8.7*  HCT 28.2*  MCV 78.1  PLT [id number]   Basic Metabolic Panel  Recent Labs  07/22/16 1244 07/23/16 0650 07/24/16 0508  NA 129* 135 135  K 5.1 4.1 3.8  CL 85* 89* 90*  CO2 32 32 37*  GLUCOSE 130* 94 119*  BUN 23* 21* 23*    CREATININE 1.36* 1.26* 1.46*  CALCIUM 10.0 9.5 9.4  MG 1.5* 2.0  --    Liver Function Tests  Recent Labs  07/22/16 1244  AST 20  ALT 10*  ALKPHOS 84  BILITOT 0.8  PROT 7.0  ALBUMIN 4.1   No results for input(s): LIPASE, AMYLASE in the last 72 hours. Cardiac Enzymes No results for input(s): CKTOTAL, CKMB, CKMBINDEX, TROPONINI in the last 72 hours.  BNP: BNP (last 3 results)  Recent Labs  04/08/16 1531 05/13/16 2030 07/22/16 1244  BNP 920.0* 240.0* 326.0*    ProBNP (last 3 results) No results for input(s): PROBNP in the last 8760 hours.   D-Dimer No results for input(s): DDIMER in the last 72 hours. Hemoglobin A1C No results for input(s): HGBA1C in the last 72 hours. Fasting Lipid Panel No results for input(s): CHOL, HDL, LDLCALC, TRIG, CHOLHDL, LDLDIRECT in the last 72 hours. Thyroid Function Tests No results for input(s): TSH, T4TOTAL, T3FREE, THYROIDAB in the last 72 hours.  Invalid input(s): FREET3  Other results:     Imaging/Studies:  Dg Chest 2 View  Result Date:  07/23/2016 CLINICAL DATA:  COPD EXAM: CHEST  2 VIEW COMPARISON:  05/13/2016 FINDINGS: Cardiomegaly is noted. Mild interstitial prominence bilateral without convincing pulmonary edema. Mild basilar atelectasis. No segmental infiltrate. Degenerative changes bilateral shoulders. IMPRESSION: Cardiomegaly. Mild interstitial prominence bilateral without convincing pulmonary edema. Mild basilar atelectasis. Degenerative changes bilateral shoulders. Electronically Signed   By: Lahoma Crocker M.D.   On: 07/23/2016 10:45    Latest Echo  Latest Cath   Medications:     Scheduled Medications: . [MAR Hold] ALPRAZolam  0.25 mg Oral BID  . [MAR Hold] amiodarone  200 mg Oral BID  . [MAR Hold] apixaban  5 mg Oral BID  . [MAR Hold] atorvastatin  20 mg Oral Daily  . [MAR Hold] Chlorhexidine Gluconate Cloth  6 each Topical Q0600  . [MAR Hold] citalopram  20 mg Oral q morning - 10a  . [MAR Hold]  clopidogrel  75 mg Oral Q breakfast  . [MAR Hold] diltiazem  240 mg Oral Daily  . [MAR Hold] fluticasone furoate-vilanterol  1 puff Inhalation Daily  . [MAR Hold] furosemide  80 mg Intravenous BID  . [MAR Hold] gabapentin  300 mg Oral BID  . [MAR Hold] insulin aspart  0-15 Units Subcutaneous TID WC  . [MAR Hold] insulin aspart  0-5 Units Subcutaneous QHS  . [MAR Hold] insulin glargine  15 Units Subcutaneous Q2200  . [MAR Hold] linagliptin  5 mg Oral Daily  . [MAR Hold] metFORMIN  500 mg Oral BID WC  . [MAR Hold] mupirocin ointment  1 application Nasal BID  . [MAR Hold] pantoprazole  40 mg Oral q morning - 10a  . [MAR Hold] sodium chloride flush  3 mL Intravenous Q12H  . [MAR Hold] sodium chloride flush  3 mL Intravenous Q12H  . [MAR Hold] sodium chloride flush  3 mL Intravenous Q12H  . [MAR Hold] spironolactone  12.5 mg Oral Daily    Infusions: . sodium chloride 10 mL/hr at 07/22/16 1528  . sodium chloride    . sodium chloride      PRN Medications: [MAR Hold] sodium chloride, [MAR Hold] acetaminophen, [MAR Hold] albuterol, [MAR Hold] hydrocortisone cream, [MAR Hold] ipratropium-albuterol, [MAR Hold] ondansetron (ZOFRAN) IV, [MAR Hold] oxyCODONE-acetaminophen **AND** [MAR Hold] oxyCODONE, [MAR Hold] sodium chloride flush, [MAR Hold] sodium chloride flush, [MAR Hold] sodium chloride flush   Assessment/Plan   [name] is a 65 y.o. female with hx of Diastolic CHF, Paroxysmal Aflutter, PAD, COPD, DM2, and CAD who presented for DCCV 07/22/16. This was deferred and she was admitted for diuresis with volume overload.   1. Acute on chronic diastolic HF - Volume status much improved 2. Atrial flutter (unspecified type), paroxysmal    --CHADSVASC 5  - Back in NSR after DC-CV today - Continue eliquis.  3. Acute on chronic respiratory failure - Improved with diuresis.  4. PAD s/p R BKA - Stable.  5. Severe COPD - Continue home meds 6. DM2 - SSI while in house.  7. CAD    --s/p  LAD stent in 8/17 - No ACS. No ASA with stable disease on eliquis.   Volume improved. Back in NSR. Home today.    Length of Stay: 2  Glori Bickers MD 07/24/2016, 8:44 AM  Advanced Heart Failure Team Pager [phone] (M-F; [place])  Please contact [facility] for night-coverage after hours (4p -7a ) and weekends on [email or web address]

## 2016-07-24 NOTE — Transfer of Care (Signed)
Immediate Anesthesia Transfer of Care Note  Patient: [name]  Procedure(s) Performed: Procedure(s): CARDIOVERSION (N/A)  Patient Location: Endoscopy Unit  Anesthesia Type:General  Level of Consciousness: awake, alert , oriented, patient cooperative and responds to stimulation  Airway & Oxygen Therapy: Patient Spontanous Breathing and Patient connected to nasal cannula oxygen  Post-op Assessment: Report given to RN, Post -op Vital signs reviewed and stable and Patient moving all extremities X 4  Post vital signs: Reviewed and stable  Last Vitals:  Vitals:   07/24/16 0839 07/24/16 0840  BP: (!) 125/53   Pulse: 70 71  Resp: 16 18  Temp:      Last Pain:  Vitals:   07/24/16 0738  TempSrc: Oral  PainSc: 8       Patients Stated Pain Goal: 0 ([id number])  Complications: No apparent anesthesia complications

## 2016-07-24 NOTE — Discharge Summary (Signed)
Advanced Heart Failure Discharge Note   Discharge Summary   Patient ID: [name] MRN: [id number], DOB/AGE: [birth year] 65 y.o. Admit date: 07/22/2016 D/C date:     07/24/2016   Primary Discharge Diagnoses:  1. Acute on chronic diastolic HF 2. Atrial flutter (unspecified type), paroxysmal s/p DCCV 07/24/16 with return to [facility].  --CHADSVASC 5  3. Acute on chronic respiratory failure 4. PAD s/p R BKA 5. Severe COPD 6. DM2 7. CAD  Hospital Course:   [name] a 65 y.o.with hx of Chronic Diastolic CHF (LVEF 99991111, PA peak pressure 68 mm Hg 04/09/16), COPD, severe PAD s/p R BKA 2/17, carotid stenosis s/p B CEA, DM2, HTN, HL, and Chronic pain. She has been followed by Dr. Acie Fredrickson for sinus tachycardia (versus SVT) and diastolic HF.   Pt admitted from [facility] 07/22/16 when she presented for DCCV but was found to be volume overloaded with progressive respiratory distress. Amiodarone increased to 200 mg BID and admitted for IV diuresis.   Respiratory status improved with diuresis. Overall patient diuresed 3.2 L and down 9 lbs.  She underwent successful cardioversion on 07/24/16 and was thought stable for discharge with follow up as below.   Discharge Weight Range: 170 lbs Discharge Vitals: Blood pressure (!) 126/59, pulse 76, temperature 98.4 F (36.9 C), temperature source Oral, resp. rate 16, height 5\' 6"  (1.676 m), weight 170 lb 8 oz (77.3 kg), SpO2 92 %.  Labs: Lab Results  Component Value Date   WBC 11.0 (H) 07/22/2016   HGB 8.7 (L) 07/22/2016   HCT 28.2 (L) 07/22/2016   MCV 78.1 07/22/2016   PLT 393 07/22/2016    Recent Labs Lab 07/22/16 1244  07/24/16 0508  NA 129*  < > 135  K 5.1  < > 3.8  CL 85*  < > 90*  CO2 32  < > 37*  BUN 23*  < > 23*  CREATININE 1.36*  < > 1.46*  CALCIUM 10.0  < > 9.4  PROT 7.0  --   --   BILITOT 0.8  --   --   ALKPHOS 84  --   --   ALT 10*  --   --   AST 20  --   --   GLUCOSE 130*  < > 119*  < > = values in this interval not  displayed. Lab Results  Component Value Date   CHOL 80 04/12/2015   HDL 32 (L) 04/12/2015   LDLCALC 37 04/12/2015   TRIG 53 04/12/2015   BNP (last 3 results)  Recent Labs  04/08/16 1531 05/13/16 2030 07/22/16 1244  BNP 920.0* 240.0* 326.0*    ProBNP (last 3 results) No results for input(s): PROBNP in the last 8760 hours.   Diagnostic Studies/Procedures   Dg Chest 2 View  Result Date: 07/23/2016 CLINICAL DATA:  COPD EXAM: CHEST  2 VIEW COMPARISON:  05/13/2016 FINDINGS: Cardiomegaly is noted. Mild interstitial prominence bilateral without convincing pulmonary edema. Mild basilar atelectasis. No segmental infiltrate. Degenerative changes bilateral shoulders. IMPRESSION: Cardiomegaly. Mild interstitial prominence bilateral without convincing pulmonary edema. Mild basilar atelectasis. Degenerative changes bilateral shoulders. Electronically Signed   By: Lahoma Crocker M.D.   On: 07/23/2016 10:45    Discharge Medications     Medication List    TAKE these medications   albuterol 108 (90 Base) MCG/ACT inhaler Commonly known as:  PROVENTIL HFA;VENTOLIN HFA Inhale 2 puffs into the lungs every 6 (six) hours as needed for wheezing or shortness of breath.  ALPRAZolam 0.25 MG tablet Commonly known as:  XANAX Take 1 tablet (0.25 mg total) by mouth 2 (two) times daily.   amiodarone 200 MG tablet Commonly known as:  PACERONE Take 1 tablet (200 mg total) by mouth 2 (two) times daily.   apixaban 5 MG Tabs tablet Commonly known as:  ELIQUIS Take 1 tablet (5 mg total) by mouth 2 (two) times daily.   atorvastatin 20 MG tablet Commonly known as:  LIPITOR Take 20 mg by mouth daily at 6 PM.   citalopram 20 MG tablet Commonly known as:  CELEXA Take 20 mg by mouth daily.   clopidogrel 75 MG tablet Commonly known as:  PLAVIX Take 1 tablet (75 mg total) by mouth daily with breakfast.   diltiazem 240 MG 24 hr capsule Commonly known as:  CARDIZEM CD Take 1 capsule (240 mg total) by  mouth daily.   fluticasone furoate-vilanterol 100-25 MCG/INH Aepb Commonly known as:  BREO ELLIPTA Inhale 1 puff into the lungs daily.   gabapentin 600 MG tablet Commonly known as:  NEURONTIN Take 600 mg by mouth 3 (three) times daily.   insulin aspart 100 UNIT/ML injection Commonly known as:  novoLOG Inject 10 Units into the skin 3 (three) times daily as needed for high blood sugar (CBG >150).   ipratropium-albuterol 0.5-2.5 (3) MG/3ML Soln Commonly known as:  DUONEB Take 3 mLs by nebulization every 6 (six) hours as needed (wheezing).   LANTUS SOLOSTAR 100 UNIT/ML Solostar Pen Generic drug:  Insulin Glargine Inject 15 Units into the skin daily at 10 pm.   metFORMIN 500 MG tablet Commonly known as:  GLUCOPHAGE Take 500 mg by mouth 2 (two) times daily with a meal.   oxyCODONE-acetaminophen 10-325 MG tablet Commonly known as:  PERCOCET Take 1 tablet by mouth every 4 (four) hours as needed for pain.   OXYGEN Inhale 2 L into the lungs continuous.   pantoprazole 40 MG tablet Commonly known as:  PROTONIX Take 40 mg by mouth daily.   sitaGLIPtin 100 MG tablet Commonly known as:  JANUVIA Take 100 mg by mouth daily.   spironolactone 25 MG tablet Commonly known as:  ALDACTONE Take 0.5 tablets (12.5 mg total) by mouth daily. What changed:  when to take this   torsemide 20 MG tablet Commonly known as:  DEMADEX Take 2 tablets (40 mg total) by mouth daily.       Disposition   The patient will be discharged in stable condition to home. Discharge Instructions    (HEART FAILURE PATIENTS) Call MD:  Anytime you have any of the following symptoms: 1) 3 pound weight gain in 24 hours or 5 pounds in 1 week 2) shortness of breath, with or without a dry hacking cough 3) swelling in the hands, feet or stomach 4) if you have to sleep on extra pillows at night in order to breathe.    Complete by:  As directed    Diet - low sodium heart healthy    Complete by:  As directed    Heart  Failure patients record your daily weight using the same scale at the same time of day    Complete by:  As directed    Increase activity slowly    Complete by:  As directed      Follow-up Information    [facility] Follow up on 08/05/2016.   Specialty:  Cardiology Why:  at 1200 pm for post hospital follow up.  Please bring  all of your medications to your visit. The code for parking is 0004. Contact information: [address] [id number] [place] [phone]            Duration of Discharge Encounter: Greater than 35 minutes   Signed, Annamaria Helling 07/24/2016, 12:25 PM  Patient seen and examined with Oda Kilts, PA-C. We discussed all aspects of the encounter. I agree with the assessment and plan as stated above.   Improved with DC-CV and diuresis. Maribel for d/c today with f/u in [facility] Clinic.  Albie Bazin,MD 9:52 PM

## 2016-07-25 ENCOUNTER — Encounter (HOSPITAL_COMMUNITY): Payer: Self-pay | Admitting: Internal Medicine

## 2016-07-26 ENCOUNTER — Other Ambulatory Visit (HOSPITAL_COMMUNITY): Payer: Self-pay

## 2016-07-26 ENCOUNTER — Encounter (HOSPITAL_COMMUNITY): Payer: Self-pay

## 2016-07-26 ENCOUNTER — Other Ambulatory Visit: Payer: Self-pay

## 2016-07-26 ENCOUNTER — Encounter (HOSPITAL_COMMUNITY): Payer: Self-pay | Admitting: Emergency Medicine

## 2016-07-26 ENCOUNTER — Encounter (HOSPITAL_COMMUNITY)
Admission: RE | Admit: 2016-07-26 | Discharge: 2016-07-26 | Disposition: A | Discharge status: Home / Self Care | Payer: Medicare Other | Source: Ambulatory Visit | Attending: Ophthalmology | Admitting: Ophthalmology

## 2016-07-26 ENCOUNTER — Inpatient Hospital Stay (HOSPITAL_COMMUNITY)
Admission: EM | Admit: 2016-07-26 | Discharge: 2016-08-16 | DRG: 208 | Disposition: E | Payer: Medicare Other | Attending: Pulmonary Disease | Admitting: Pulmonary Disease

## 2016-07-26 ENCOUNTER — Inpatient Hospital Stay (HOSPITAL_COMMUNITY): Payer: Medicare Other

## 2016-07-26 ENCOUNTER — Emergency Department (HOSPITAL_COMMUNITY): Payer: Medicare Other

## 2016-07-26 DIAGNOSIS — Z823 Family history of stroke: Secondary | ICD-10-CM

## 2016-07-26 DIAGNOSIS — D638 Anemia in other chronic diseases classified elsewhere: Secondary | ICD-10-CM | POA: Diagnosis present

## 2016-07-26 DIAGNOSIS — S0990XA Unspecified injury of head, initial encounter: Secondary | ICD-10-CM

## 2016-07-26 DIAGNOSIS — E1151 Type 2 diabetes mellitus with diabetic peripheral angiopathy without gangrene: Secondary | ICD-10-CM | POA: Diagnosis present

## 2016-07-26 DIAGNOSIS — I130 Hypertensive heart and chronic kidney disease with heart failure and stage 1 through stage 4 chronic kidney disease, or unspecified chronic kidney disease: Secondary | ICD-10-CM | POA: Diagnosis present

## 2016-07-26 DIAGNOSIS — Z66 Do not resuscitate: Secondary | ICD-10-CM | POA: Diagnosis not present

## 2016-07-26 DIAGNOSIS — I4901 Ventricular fibrillation: Secondary | ICD-10-CM | POA: Diagnosis not present

## 2016-07-26 DIAGNOSIS — R0602 Shortness of breath: Secondary | ICD-10-CM | POA: Diagnosis present

## 2016-07-26 DIAGNOSIS — N179 Acute kidney failure, unspecified: Secondary | ICD-10-CM | POA: Diagnosis present

## 2016-07-26 DIAGNOSIS — Z89611 Acquired absence of right leg above knee: Secondary | ICD-10-CM

## 2016-07-26 DIAGNOSIS — I472 Ventricular tachycardia: Secondary | ICD-10-CM | POA: Diagnosis not present

## 2016-07-26 DIAGNOSIS — I5032 Chronic diastolic (congestive) heart failure: Secondary | ICD-10-CM | POA: Diagnosis present

## 2016-07-26 DIAGNOSIS — Z515 Encounter for palliative care: Secondary | ICD-10-CM | POA: Diagnosis present

## 2016-07-26 DIAGNOSIS — Z7902 Long term (current) use of antithrombotics/antiplatelets: Secondary | ICD-10-CM

## 2016-07-26 DIAGNOSIS — G8929 Other chronic pain: Secondary | ICD-10-CM | POA: Diagnosis present

## 2016-07-26 DIAGNOSIS — J9601 Acute respiratory failure with hypoxia: Secondary | ICD-10-CM

## 2016-07-26 DIAGNOSIS — M545 Low back pain: Secondary | ICD-10-CM | POA: Diagnosis present

## 2016-07-26 DIAGNOSIS — K219 Gastro-esophageal reflux disease without esophagitis: Secondary | ICD-10-CM | POA: Diagnosis present

## 2016-07-26 DIAGNOSIS — I272 Pulmonary hypertension, unspecified: Secondary | ICD-10-CM | POA: Diagnosis present

## 2016-07-26 DIAGNOSIS — Z881 Allergy status to other antibiotic agents status: Secondary | ICD-10-CM

## 2016-07-26 DIAGNOSIS — I4891 Unspecified atrial fibrillation: Secondary | ICD-10-CM | POA: Diagnosis present

## 2016-07-26 DIAGNOSIS — E873 Alkalosis: Secondary | ICD-10-CM | POA: Diagnosis present

## 2016-07-26 DIAGNOSIS — J9621 Acute and chronic respiratory failure with hypoxia: Principal | ICD-10-CM | POA: Diagnosis present

## 2016-07-26 DIAGNOSIS — Y95 Nosocomial condition: Secondary | ICD-10-CM | POA: Diagnosis present

## 2016-07-26 DIAGNOSIS — E1122 Type 2 diabetes mellitus with diabetic chronic kidney disease: Secondary | ICD-10-CM | POA: Diagnosis present

## 2016-07-26 DIAGNOSIS — G92 Toxic encephalopathy: Secondary | ICD-10-CM | POA: Diagnosis present

## 2016-07-26 DIAGNOSIS — Z880 Allergy status to penicillin: Secondary | ICD-10-CM

## 2016-07-26 DIAGNOSIS — Z794 Long term (current) use of insulin: Secondary | ICD-10-CM

## 2016-07-26 DIAGNOSIS — N182 Chronic kidney disease, stage 2 (mild): Secondary | ICD-10-CM | POA: Diagnosis present

## 2016-07-26 DIAGNOSIS — J440 Chronic obstructive pulmonary disease with acute lower respiratory infection: Secondary | ICD-10-CM | POA: Diagnosis present

## 2016-07-26 DIAGNOSIS — I249 Acute ischemic heart disease, unspecified: Secondary | ICD-10-CM | POA: Diagnosis present

## 2016-07-26 DIAGNOSIS — E1142 Type 2 diabetes mellitus with diabetic polyneuropathy: Secondary | ICD-10-CM | POA: Diagnosis present

## 2016-07-26 DIAGNOSIS — I4892 Unspecified atrial flutter: Secondary | ICD-10-CM | POA: Diagnosis present

## 2016-07-26 DIAGNOSIS — I2510 Atherosclerotic heart disease of native coronary artery without angina pectoris: Secondary | ICD-10-CM | POA: Diagnosis present

## 2016-07-26 DIAGNOSIS — Z452 Encounter for adjustment and management of vascular access device: Secondary | ICD-10-CM

## 2016-07-26 DIAGNOSIS — E785 Hyperlipidemia, unspecified: Secondary | ICD-10-CM | POA: Diagnosis present

## 2016-07-26 DIAGNOSIS — J189 Pneumonia, unspecified organism: Secondary | ICD-10-CM | POA: Diagnosis present

## 2016-07-26 DIAGNOSIS — E1165 Type 2 diabetes mellitus with hyperglycemia: Secondary | ICD-10-CM | POA: Diagnosis present

## 2016-07-26 DIAGNOSIS — Z79899 Other long term (current) drug therapy: Secondary | ICD-10-CM

## 2016-07-26 DIAGNOSIS — I2781 Cor pulmonale (chronic): Secondary | ICD-10-CM | POA: Diagnosis present

## 2016-07-26 DIAGNOSIS — G4730 Sleep apnea, unspecified: Secondary | ICD-10-CM | POA: Diagnosis present

## 2016-07-26 DIAGNOSIS — R571 Hypovolemic shock: Secondary | ICD-10-CM | POA: Diagnosis present

## 2016-07-26 DIAGNOSIS — F419 Anxiety disorder, unspecified: Secondary | ICD-10-CM | POA: Diagnosis present

## 2016-07-26 DIAGNOSIS — I469 Cardiac arrest, cause unspecified: Secondary | ICD-10-CM | POA: Diagnosis present

## 2016-07-26 DIAGNOSIS — Z8249 Family history of ischemic heart disease and other diseases of the circulatory system: Secondary | ICD-10-CM

## 2016-07-26 DIAGNOSIS — Z7901 Long term (current) use of anticoagulants: Secondary | ICD-10-CM

## 2016-07-26 DIAGNOSIS — S27329A Contusion of lung, unspecified, initial encounter: Secondary | ICD-10-CM | POA: Diagnosis present

## 2016-07-26 DIAGNOSIS — Z885 Allergy status to narcotic agent status: Secondary | ICD-10-CM

## 2016-07-26 DIAGNOSIS — Z9981 Dependence on supplemental oxygen: Secondary | ICD-10-CM

## 2016-07-26 DIAGNOSIS — R001 Bradycardia, unspecified: Secondary | ICD-10-CM

## 2016-07-26 DIAGNOSIS — J9690 Respiratory failure, unspecified, unspecified whether with hypoxia or hypercapnia: Secondary | ICD-10-CM | POA: Diagnosis present

## 2016-07-26 DIAGNOSIS — Z9049 Acquired absence of other specified parts of digestive tract: Secondary | ICD-10-CM

## 2016-07-26 DIAGNOSIS — Z87891 Personal history of nicotine dependence: Secondary | ICD-10-CM

## 2016-07-26 DIAGNOSIS — Z8673 Personal history of transient ischemic attack (TIA), and cerebral infarction without residual deficits: Secondary | ICD-10-CM

## 2016-07-26 HISTORY — DX: Personal history of other diseases of the circulatory system: Z86.79

## 2016-07-26 HISTORY — DX: Sleep apnea, unspecified: G47.30

## 2016-07-26 HISTORY — DX: Anemia, unspecified: D64.9

## 2016-07-26 HISTORY — DX: Respiratory failure, unspecified, unspecified whether with hypoxia or hypercapnia: J96.90

## 2016-07-26 LAB — CBC WITH DIFFERENTIAL/PLATELET
BASOS ABS: 0 10*3/uL (ref 0.0–0.1)
BASOS PCT: 0 %
Eosinophils Absolute: 0 10*3/uL (ref 0.0–0.7)
Eosinophils Relative: 0 %
HEMATOCRIT: 32.3 % — AB (ref 36.0–46.0)
Hemoglobin: 9.8 g/dL — ABNORMAL LOW (ref 12.0–15.0)
LYMPHS PCT: 13 %
Lymphs Abs: 2.4 10*3/uL (ref 0.7–4.0)
MCH: 24.1 pg — ABNORMAL LOW (ref 26.0–34.0)
MCHC: 30.3 g/dL (ref 30.0–36.0)
MCV: 79.6 fL (ref 78.0–100.0)
Monocytes Absolute: 1.4 10*3/uL — ABNORMAL HIGH (ref 0.1–1.0)
Monocytes Relative: 8 %
NEUTROS ABS: 14.2 10*3/uL — AB (ref 1.7–7.7)
Neutrophils Relative %: 79 %
PLATELETS: 536 10*3/uL — AB (ref 150–400)
RBC: 4.06 MIL/uL (ref 3.87–5.11)
RDW: 16.3 % — ABNORMAL HIGH (ref 11.5–15.5)
WBC: 18 10*3/uL — AB (ref 4.0–10.5)

## 2016-07-26 LAB — COMPREHENSIVE METABOLIC PANEL
ALK PHOS: 81 U/L (ref 38–126)
ALT: 15 U/L (ref 14–54)
ANION GAP: 15 (ref 5–15)
AST: 28 U/L (ref 15–41)
Albumin: 4.2 g/dL (ref 3.5–5.0)
BILIRUBIN TOTAL: 1 mg/dL (ref 0.3–1.2)
BUN: 34 mg/dL — ABNORMAL HIGH (ref 6–20)
CALCIUM: 9.1 mg/dL (ref 8.9–10.3)
CO2: 27 mmol/L (ref 22–32)
Chloride: 81 mmol/L — ABNORMAL LOW (ref 101–111)
Creatinine, Ser: 1.84 mg/dL — ABNORMAL HIGH (ref 0.44–1.00)
GFR, EST AFRICAN AMERICAN: 32 mL/min — AB (ref >60)
GFR, EST NON AFRICAN AMERICAN: 27 mL/min — AB (ref >60)
GLUCOSE: 156 mg/dL — AB (ref 65–99)
POTASSIUM: 4 mmol/L (ref 3.5–5.1)
Sodium: 123 mmol/L — ABNORMAL LOW (ref 135–145)
TOTAL PROTEIN: 7.9 g/dL (ref 6.5–8.1)

## 2016-07-26 LAB — CBG MONITORING, ED
GLUCOSE-CAPILLARY: 148 mg/dL — AB (ref 65–99)
GLUCOSE-CAPILLARY: 206 mg/dL — AB (ref 65–99)

## 2016-07-26 LAB — TROPONIN I

## 2016-07-26 LAB — PROTIME-INR
INR: 1.99
Prothrombin Time: 22.9 seconds — ABNORMAL HIGH (ref 11.4–15.2)

## 2016-07-26 LAB — I-STAT CG4 LACTIC ACID, ED: LACTIC ACID, VENOUS: 5.15 mmol/L — AB (ref 0.5–1.9)

## 2016-07-26 LAB — BRAIN NATRIURETIC PEPTIDE: B Natriuretic Peptide: 380 pg/mL — ABNORMAL HIGH (ref 0.0–100.0)

## 2016-07-26 MED ORDER — MIDAZOLAM HCL 2 MG/2ML IJ SOLN: 1.0000 mg | INTRAMUSCULAR | Status: DC | PRN

## 2016-07-26 MED ORDER — CALCIUM GLUCONATE 10 % IV SOLN
INTRAVENOUS | Status: AC
  Filled 2016-07-26: qty 10

## 2016-07-26 MED ORDER — MIDAZOLAM HCL 2 MG/2ML IJ SOLN
2.0000 mg | Freq: Once | INTRAMUSCULAR | Status: AC
  Administered 2016-07-26: 2 mg via INTRAVENOUS

## 2016-07-26 MED ORDER — FUROSEMIDE 10 MG/ML IJ SOLN
80.0000 mg | Freq: Once | INTRAMUSCULAR | Status: AC
  Administered 2016-07-26: 80 mg via INTRAVENOUS
  Filled 2016-07-26: qty 8

## 2016-07-26 MED ORDER — FENTANYL CITRATE (PF) 100 MCG/2ML IJ SOLN
INTRAMUSCULAR | Status: AC
  Filled 2016-07-26: qty 2

## 2016-07-26 MED ORDER — MAGNESIUM SULFATE 50 % IJ SOLN: INTRAMUSCULAR | Status: AC | PRN

## 2016-07-26 MED ORDER — ALBUTEROL (5 MG/ML) CONTINUOUS INHALATION SOLN
10.0000 mg/h | INHALATION_SOLUTION | RESPIRATORY_TRACT | Status: DC
  Administered 2016-07-26: 10 mg/h via RESPIRATORY_TRACT
  Filled 2016-07-26: qty 20

## 2016-07-26 MED ORDER — NALOXONE HCL 0.4 MG/ML IJ SOLN
0.4000 mg | Freq: Once | INTRAMUSCULAR | Status: AC
  Administered 2016-07-26: 0.4 mg via INTRAVENOUS

## 2016-07-26 MED ORDER — LORAZEPAM 2 MG/ML IJ SOLN
2.0000 mg | Freq: Once | INTRAMUSCULAR | Status: AC
  Administered 2016-07-26: 2 mg via INTRAVENOUS

## 2016-07-26 MED ORDER — MIDAZOLAM HCL 2 MG/2ML IJ SOLN
INTRAMUSCULAR | Status: AC
  Filled 2016-07-26: qty 2

## 2016-07-26 MED ORDER — LORAZEPAM 2 MG/ML IJ SOLN
INTRAMUSCULAR | Status: AC
  Filled 2016-07-26: qty 1

## 2016-07-26 MED ORDER — CALCIUM CHLORIDE 10 % IV SOLN
INTRAVENOUS | Status: AC
  Filled 2016-07-26: qty 10

## 2016-07-26 MED ORDER — EPINEPHRINE PF 1 MG/ML IJ SOLN
INTRAMUSCULAR | Status: AC
  Filled 2016-07-26: qty 3

## 2016-07-26 MED ORDER — CALCIUM CHLORIDE 10 % IV SOLN
INTRAVENOUS | Status: AC | PRN
  Administered 2016-07-26: 1 g via INTRAVENOUS

## 2016-07-26 MED ORDER — EPINEPHRINE PF 1 MG/ML IJ SOLN
INTRAMUSCULAR | Status: AC
  Filled 2016-07-26: qty 1

## 2016-07-26 MED ORDER — SODIUM BICARBONATE 8.4 % IV SOLN
INTRAVENOUS | Status: AC | PRN
  Administered 2016-07-26: 50 meq via INTRAVENOUS

## 2016-07-26 MED ORDER — MIDAZOLAM HCL 5 MG/5ML IJ SOLN
INTRAMUSCULAR | Status: AC
  Filled 2016-07-26: qty 5

## 2016-07-26 MED ORDER — SODIUM CHLORIDE 0.9 % IV SOLN
25.0000 ug/h | INTRAVENOUS | Status: DC
  Administered 2016-07-27: 275 ug/h via INTRAVENOUS
  Filled 2016-07-26: qty 50

## 2016-07-26 MED ORDER — EPINEPHRINE PF 1 MG/10ML IJ SOSY
PREFILLED_SYRINGE | INTRAMUSCULAR | Status: AC | PRN
  Administered 2016-07-26 (×3): 1 mg via INTRAVENOUS

## 2016-07-26 MED ORDER — FENTANYL CITRATE (PF) 100 MCG/2ML IJ SOLN
50.0000 ug | Freq: Once | INTRAMUSCULAR | Status: AC
  Administered 2016-07-26: 50 ug via INTRAVENOUS
  Filled 2016-07-26: qty 2

## 2016-07-26 MED ORDER — VANCOMYCIN HCL IN DEXTROSE 1-5 GM/200ML-% IV SOLN
1000.0000 mg | Freq: Once | INTRAVENOUS | Status: AC
  Administered 2016-07-26: 1000 mg via INTRAVENOUS
  Filled 2016-07-26: qty 200

## 2016-07-26 MED ORDER — SUCCINYLCHOLINE CHLORIDE 20 MG/ML IJ SOLN
100.0000 mg | Freq: Once | INTRAMUSCULAR | Status: AC
  Administered 2016-07-26: 100 mg via INTRAVENOUS

## 2016-07-26 MED ORDER — ETOMIDATE 2 MG/ML IV SOLN
25.0000 mg | Freq: Once | INTRAVENOUS | Status: AC
  Administered 2016-07-26: 25 mg via INTRAVENOUS

## 2016-07-26 MED ORDER — MIDAZOLAM HCL 2 MG/2ML IJ SOLN
2.0000 mg | Freq: Once | INTRAMUSCULAR | Status: AC
  Administered 2016-07-26: 2 mg via INTRAVENOUS
  Filled 2016-07-26: qty 2

## 2016-07-26 MED ORDER — ATROPINE SULFATE 1 MG/ML IJ SOLN
INTRAMUSCULAR | Status: AC | PRN
  Administered 2016-07-26 (×2): 1 mg via INTRAVENOUS

## 2016-07-26 MED ORDER — FENTANYL CITRATE (PF) 100 MCG/2ML IJ SOLN
50.0000 ug | Freq: Once | INTRAMUSCULAR | Status: AC
  Administered 2016-07-26: 50 ug via INTRAVENOUS

## 2016-07-26 MED ORDER — FENTANYL BOLUS VIA INFUSION
25.0000 ug | INTRAVENOUS | Status: DC | PRN
  Administered 2016-07-28: 100 ug via INTRAVENOUS
  Filled 2016-07-26: qty 100

## 2016-07-26 MED ORDER — EPINEPHRINE PF 1 MG/ML IJ SOLN
0.5000 ug/min | INTRAVENOUS | Status: AC
  Administered 2016-07-26: 0.5 ug/min via INTRAVENOUS
  Filled 2016-07-26: qty 4

## 2016-07-26 MED ORDER — CEFEPIME HCL 2 G IJ SOLR
2.0000 g | Freq: Once | INTRAMUSCULAR | Status: AC
  Administered 2016-07-26: 2 g via INTRAVENOUS
  Filled 2016-07-26: qty 2

## 2016-07-26 MED ORDER — FENTANYL CITRATE (PF) 100 MCG/2ML IJ SOLN
INTRAMUSCULAR | Status: AC
Start: 2016-07-26 — End: 2016-07-27
  Filled 2016-07-26: qty 2

## 2016-07-26 MED ORDER — NALOXONE HCL 2 MG/2ML IJ SOSY
PREFILLED_SYRINGE | INTRAMUSCULAR | Status: AC
  Filled 2016-07-26: qty 2

## 2016-07-26 MED ORDER — FENTANYL 2500MCG IN NS 250ML (10MCG/ML) PREMIX INFUSION
INTRAVENOUS | Status: AC
  Filled 2016-07-26: qty 250

## 2016-07-26 MED ORDER — SODIUM CHLORIDE 0.9 % IV SOLN
25.0000 ug/h | INTRAVENOUS | Status: DC
  Administered 2016-07-26: 25 ug/h via INTRAVENOUS
  Filled 2016-07-26: qty 50

## 2016-07-26 NOTE — ED Notes (Signed)
Attempting to obtain airway at this time. Difficult to establish airway. Oxygen 76%. Pt begin bagged by RT at this time.

## 2016-07-26 NOTE — Code Documentation (Signed)
Pt has pulse at this time. HR 30. MD gives verbal order not to restart CPR.

## 2016-07-26 NOTE — Code Documentation (Signed)
Pt has pulse at this time 

## 2016-07-26 NOTE — Pre-Procedure Instructions (Signed)
Dr Patsey Berthold aware of extensive medical history as well as recent hospital stay. No orders given at this time.

## 2016-07-26 NOTE — Code Documentation (Signed)
Family at bedside. MD at bedside.

## 2016-07-26 NOTE — ED Notes (Signed)
CPR started.

## 2016-07-26 NOTE — ED Notes (Signed)
Antibiotic given. Second culture not drawn at this time. No delay in care.

## 2016-07-26 NOTE — Patient Instructions (Signed)
[name]  07/24/2016     @PREFPERIOPPHARMACY @   Your procedure is scheduled on 07/30/2016.  Report to [facility] at 6:45 A.M.  Call this number if you have problems the morning of surgery:  [phone]   Remember:  Do not eat food or drink liquids after midnight.  Take these medicines the morning of surgery with A SIP OF WATER : XANAX, PACERONE, CELEXA, CARDIZEM, NEURONTIN, PERCOCET AND PROTONIX.  TAKE YOUR INHALER AND NEBULIZER BEFORE LEAVING HOME.  TAKE 1/2 NIGHTIME INSULIN.  DO NOT TAKE ANY DIABETIC MEDICATION AM OF SURGERY.     Do not wear jewelry, make-up or nail polish.  Do not wear lotions, powders, or perfumes, or deoderant.  Do not shave 48 hours prior to surgery.  Men may shave face and neck.  Do not bring valuables to the hospital.  [facility] is not responsible for any belongings or valuables.  Contacts, dentures or bridgework may not be worn into surgery.  Leave your suitcase in the car.  After surgery it may be brought to your room.  For patients admitted to the hospital, discharge time will be determined by your treatment team.  Patients discharged the day of surgery will not be allowed to drive home.   Name and phone number of your driver:   HUSBANDSpecial instructions:  N/A  Please read over the following fact sheets that you were given. Care and Recovery After Surgery   PATIENT INSTRUCTIONS POST-ANESTHESIA  IMMEDIATELY FOLLOWING SURGERY:  Do not drive or operate machinery for the first twenty four hours after surgery.  Do not make any important decisions for twenty four hours after surgery or while taking narcotic pain medications or sedatives.  If you develop intractable nausea and vomiting or a severe headache please notify your doctor immediately.  FOLLOW-UP:  Please make an appointment with your surgeon as instructed. You do not need to follow up with anesthesia unless specifically instructed to do so.  WOUND CARE INSTRUCTIONS (if applicable):   Keep a dry clean dressing on the anesthesia/puncture wound site if there is drainage.  Once the wound has quit draining you may leave it open to air.  Generally you should leave the bandage intact for twenty four hours unless there is drainage.  If the epidural site drains for more than 36-48 hours please call the anesthesia department.  QUESTIONS?:  Please feel free to call your physician or the hospital operator if you have any questions, and they will be happy to assist you.        A cataract is a clouding of the lens of the eye. When a lens becomes cloudy, vision is reduced based on the degree and nature of the clouding. Surgery may be needed to improve vision. Surgery removes the cloudy lens and usually replaces it with a substitute lens (intraocular lens, IOL). LET YOUR EYE DOCTOR KNOW ABOUT:  Allergies to food or medicine.  Medicines taken including herbs, eye drops, over-the-counter medicines, and creams.  Use of steroids (by mouth or creams).  Previous problems with anesthetics or numbing medicine.  History of bleeding problems or blood clots.  Previous surgery.  Other health problems, including diabetes and kidney problems.  Possibility of pregnancy, if this applies. RISKS AND COMPLICATIONS  Infection.  Inflammation of the eyeball (endophthalmitis) that can spread to both eyes (sympathetic ophthalmia).  Poor wound healing.  If an IOL is inserted, it can later fall out of proper position. This is very uncommon.  Clouding of  the part of your eye that holds an IOL in place. This is called an "after-cataract." These are uncommon but easily treated. BEFORE THE PROCEDURE  Do not eat or drink anything except small amounts of water for 8 to 12 before your surgery, or as directed by your caregiver.  Unless you are told otherwise, continue any eye drops you have been prescribed.  Talk to your primary caregiver about all other medicines that you take (both prescription and  nonprescription). In some cases, you may need to stop or change medicines near the time of your surgery. This is most important if you are taking blood-thinning medicine.Do not stop medicines unless you are told to do so.  Arrange for someone to drive you to and from the procedure.  Do not put contact lenses in either eye on the day of your surgery. PROCEDURE There is more than one method for safely removing a cataract. Your doctor can explain the differences and help determine which is best for you. Phacoemulsification surgery is the most common form of cataract surgery.  An injection is given behind the eye or eye drops are given to make this a painless procedure.  A small cut (incision) is made on the edge of the clear, dome-shaped surface that covers the front of the eye (cornea).  A tiny probe is painlessly inserted into the eye. This device gives off ultrasound waves that soften and break up the cloudy center of the lens. This makes it easier for the cloudy lens to be removed by suction.  An IOL may be implanted.  The normal lens of the eye is covered by a clear capsule. Part of that capsule is intentionally left in the eye to support the IOL.  Your surgeon may or may not use stitches to close the incision. There are other forms of cataract surgery that require a larger incision and stitches to close the eye. This approach is taken in cases where the doctor feels that the cataract cannot be easily removed using phacoemulsification. AFTER THE PROCEDURE  When an IOL is implanted, it does not need care. It becomes a permanent part of your eye and cannot be seen or felt.  Your doctor will schedule follow-up exams to check on your progress.  Review your other medicines with your doctor to see which can be resumed after surgery.  Use eye drops or take medicine as prescribed by your doctor.   This information is not intended to replace advice given to you by your health care provider.  Make sure you discuss any questions you have with your health care provider.   Document Released: 08/22/2011 Document Revised: 09/23/2014 Document Reviewed: 08/22/2011 Elsevier Interactive Patient Education [employer].

## 2016-07-26 NOTE — ED Notes (Signed)
RT at bedside.

## 2016-07-26 NOTE — ED Notes (Signed)
Difficult to establish airway. ET established. Positive color change noted.

## 2016-07-26 NOTE — Code Documentation (Signed)
Capture at 82 mA. HR 60.

## 2016-07-26 NOTE — Code Documentation (Signed)
NG tube place. Unable to verify by xray at this time.

## 2016-07-26 NOTE — ED Notes (Signed)
MD at bedside. 

## 2016-07-26 NOTE — Code Documentation (Signed)
Pulse check. Pt has a pulse at this time.

## 2016-07-26 NOTE — ED Provider Notes (Signed)
[facility] DEPT Provider Note   CSN: [id number] Arrival date & time: 07/24/2016  1745     History   Chief Complaint Chief Complaint  Patient presents with  . Loss of Consciousness    HPI [name] is a 65 y.o. female.   Shortness of Breath  This is a recurrent problem. The problem occurs continuously.The problem has been rapidly worsening.   LEVEL V CAVEAT  Past Medical History:  Diagnosis Date  . Anemia   . Anxiety   . Arthritis    "back" (10/30/2015)  . CHF (congestive heart failure) ([place])   . Chronic lower back pain   . COPD (chronic obstructive pulmonary disease) ([place])   . GERD (gastroesophageal reflux disease)   . History of atrial fibrillation   . Hyperlipidemia   . Hypertension   . Hypoxia   . On home oxygen therapy    "2L; 24/7" (07/22/2016)  . Peripheral vascular disease ([place])   . Respiratory failure ([place])   . Shingles Dec. 2016   Scalp  . Sleep apnea    uses o2 at 3 liters at night  . Stroke [facility])    no deficits  . Type II diabetes mellitus [facility])     Patient Active Problem List   Diagnosis Date Noted  . Respiratory failure ([place]) 08/12/2016  . Panlobular emphysema ([place])   . OSA on CPAP   . Obesity hypoventilation syndrome ([place])   . Acute on chronic respiratory failure ([place]) 04/18/2016  . Acute on chronic diastolic CHF (congestive heart failure) ([place]) 04/18/2016  . CAD in native artery   . Atrial flutter ([place]) 04/15/2016  . Anemia, unspecified 04/15/2016  . Pleural effusion   . Dilated cardiomyopathy ([place])   . Pulmonary hypertension   . Uncontrolled type 2 diabetes mellitus with complication ([place])   . Paroxysmal atrial fibrillation (HCC)   . Metabolic acidosis 99991111  . Altered mental state 04/08/2016  . Pressure ulcer 11/14/2015  . CHF (congestive heart failure) ([place]) 11/03/2015  . S/P AKA (above knee amputation) unilateral ([place])   . Abnormality of gait   . Postoperative pain of extremity   . Phantom limb pain ([place])   .  Essential hypertension   . Chronic obstructive pulmonary disease ([place])   . Chronic low back pain   . Poorly controlled type 2 diabetes mellitus with peripheral neuropathy ([place])   . PVD (peripheral vascular disease) ([place])   . Respiratory rate decreased   . Leukocytosis   . Atherosclerosis of right leg ([place]) 10/30/2015  . SOB (shortness of breath) 08/18/2015  . Open wound of knee, leg (except thigh), and ankle 08/18/2015  . Abnormal echocardiogram   . Atrial fibrillation with RVR ([place])   . Lower limb ischemia 04/09/2015  . Critical lower limb ischemia 04/09/2015  . Hyperkalemia 04/09/2015  . Pancreatitis 12/26/2013  . Tachycardia 12/26/2013  . Dehydration 12/26/2013  . COPD (chronic obstructive pulmonary disease) ([place]) 12/26/2013  . Essential hypertension, benign 12/26/2013  . Diabetes ([place]) 12/26/2013  . Hypomagnesemia 12/26/2013    Past Surgical History:  Procedure Laterality Date  . AMPUTATION Right 10/30/2015   Procedure: RIGHT ABOVE KNEE AMPUTATION;  Surgeon: Elam Dutch, MD;  Location: [facility];  Service: Vascular;  Laterality: Right;  . BACK SURGERY    . CARDIAC CATHETERIZATION    . CARDIAC CATHETERIZATION N/A 04/18/2016   Procedure: Right/Left Heart Cath and Coronary Angiography;  Surgeon: Jolaine Artist, MD;  Location: [place] CV LAB;  Service: Cardiovascular;  Laterality:  N/A;  . CARDIAC CATHETERIZATION N/A 04/18/2016   Procedure: Coronary Stent Intervention;  Surgeon: Belva Crome, MD;  Location: [place] CV LAB;  Service: Cardiovascular;  Laterality: N/A;  . CARDIOVERSION N/A 06/14/2016   Procedure: CARDIOVERSION;  Surgeon: Jolaine Artist, MD;  Location: [facility] ENDOSCOPY;  Service: Cardiovascular;  Laterality: N/A;  . CARDIOVERSION N/A 07/24/2016   Procedure: CARDIOVERSION;  Surgeon: Jolaine Artist, MD;  Location: [facility] ENDOSCOPY;  Service: Cardiovascular;  Laterality: N/A;  . CAROTID ENDARTERECTOMY Right 2010   Dr Kellie Simmering   . CAROTID ENDARTERECTOMY Left  03/2008   Archie Endo 01/15/2011  . FASCIOTOMY Right 04/10/2015   Procedure: FOUR COMPARTMENT FASCIOTOMY;  Surgeon: Elam Dutch, MD;  Location: [facility];  Service: Vascular;  Laterality: Right;  . FEMORAL-POPLITEAL BYPASS GRAFT Right 04/10/2015   Procedure: RIGHT  FEMORAL-BELOW THE KNEE POPLITEAL ARTERY BYPASS GRAFT USING 6 MM X 80 CM PROPATEN GRAFT;  Surgeon: Elam Dutch, MD;  Location: [place];  Service: Vascular;  Laterality: Right;  . FOOT SURGERY Bilateral    "from standing on concrete all day"  . INTRAOPERATIVE ARTERIOGRAM Right 04/10/2015   Procedure: INTRA OPERATIVE ARTERIOGRAM;  Surgeon: Elam Dutch, MD;  Location: [facility];  Service: Vascular;  Laterality: Right;  . LAPAROSCOPIC CHOLECYSTECTOMY    . LEG AMPUTATION THROUGH KNEE Right 10/30/2015  . PERIPHERAL VASCULAR CATHETERIZATION N/A 04/10/2015   Procedure: Abdominal Aortogram w/Lower Extremity;  Surgeon: Conrad Unity, MD;  Location: [place] CV LAB;  Service: Cardiovascular;  Laterality: N/A;  . PERIPHERAL VASCULAR CATHETERIZATION N/A 10/20/2015   Procedure: Abdominal Aortogram;  Surgeon: Elam Dutch, MD;  Location: [place] CV LAB;  Service: Cardiovascular;  Laterality: N/A;  . POSTERIOR LUMBAR FUSION    . SHOULDER OPEN ROTATOR CUFF REPAIR Right 02/2006   Archie Endo 01/29/2011  . THROMBECTOMY FEMORAL ARTERY Right 04/10/2015   Procedure: THROMBECTOMY RIGHT TIBIAL  ARTERY WITH VEIN PATCH ANGIOPLASTY;  Surgeon: Elam Dutch, MD;  Location[facility] OR;  Service: Vascular;  Laterality: Right;    OB History    Gravida Para Term Preterm AB Living   3 3 3     3    SAB TAB Ectopic Multiple Live Births                   Home Medications    Prior to Admission medications   Medication Sig Start Date End Date Taking? Authorizing Provider  albuterol (PROVENTIL HFA;VENTOLIN HFA) 108 (90 Base) MCG/ACT inhaler Inhale 2 puffs into the lungs every 6 (six) hours as needed for wheezing or shortness of breath.    Historical Provider, MD    ALPRAZolam Duanne Moron) 0.25 MG tablet Take 1 tablet (0.25 mg total) by mouth 2 (two) times daily. 04/20/16   Orson Eva, MD  amiodarone (PACERONE) 200 MG tablet Take 1 tablet (200 mg total) by mouth 2 (two) times daily. 07/17/16   Shirley Friar, PA-C  apixaban (ELIQUIS) 5 MG TABS tablet Take 1 tablet (5 mg total) by mouth 2 (two) times daily. 06/12/16   Larey Dresser, MD  atorvastatin (LIPITOR) 20 MG tablet Take 20 mg by mouth daily at 6 PM.     Historical Provider, MD  citalopram (CELEXA) 20 MG tablet Take 20 mg by mouth daily.  07/04/13   Historical Provider, MD  clopidogrel (PLAVIX) 75 MG tablet Take 1 tablet (75 mg total) by mouth daily with breakfast. 05/10/16   Arbutus Leas, NP  diltiazem (CARDIZEM CD) 240 MG 24 hr  capsule Take 1 capsule (240 mg total) by mouth daily. 04/20/16   Orson Eva, MD  fluticasone furoate-vilanterol (BREO ELLIPTA) 100-25 MCG/INH AEPB Inhale 1 puff into the lungs daily. 03/09/16   Lucia Gaskins, MD  gabapentin (NEURONTIN) 600 MG tablet Take 600 mg by mouth 3 (three) times daily.    Historical Provider, MD  insulin aspart (NOVOLOG) 100 UNIT/ML injection Inject 10 Units into the skin 3 (three) times daily as needed for high blood sugar (CBG >150).     Historical Provider, MD  Insulin Glargine (LANTUS SOLOSTAR) 100 UNIT/ML Solostar Pen Inject 15 Units into the skin daily at 10 pm.     Historical Provider, MD  ipratropium-albuterol (DUONEB) 0.5-2.5 (3) MG/3ML SOLN Take 3 mLs by nebulization every 6 (six) hours as needed (wheezing).     Historical Provider, MD  metFORMIN (GLUCOPHAGE) 500 MG tablet Take 500 mg by mouth 2 (two) times daily with a meal.    Historical Provider, MD  oxyCODONE-acetaminophen (PERCOCET) 10-325 MG tablet Take 1 tablet by mouth every 4 (four) hours as needed for pain.    Historical Provider, MD  OXYGEN Inhale 2 L into the lungs continuous.    Historical Provider, MD  pantoprazole (PROTONIX) 40 MG tablet Take 40 mg by mouth daily.     Historical  Provider, MD  sitaGLIPtin (JANUVIA) 100 MG tablet Take 100 mg by mouth daily.     Historical Provider, MD  spironolactone (ALDACTONE) 25 MG tablet Take 0.5 tablets (12.5 mg total) by mouth daily. 07/24/16   Shirley Friar, PA-C  torsemide (DEMADEX) 20 MG tablet Take 2 tablets (40 mg total) by mouth daily. 06/04/16   Shirley Friar, PA-C    Family History Family History  Problem Relation Age of Onset  . Cancer Father   . Heart attack Sister   . Stroke Sister   . Hypertension Sister   . Hypertension Brother     Social History Social History  Substance Use Topics  . Smoking status: Former Smoker    Packs/day: 0.12    Years: 49.00    Types: Cigarettes    Quit date: 09/10/2015  . Smokeless tobacco: Never Used  . Alcohol use No     Allergies   Codeine; Penicillins; and Ciprofloxacin   Review of Systems Review of Systems  Unable to perform ROS: Severe respiratory distress  Respiratory: Positive for shortness of breath.      Physical Exam Updated Vital Signs BP (!) 121/44   Pulse (!) 59   Temp 99 F (37.2 C)   Resp 14   Ht 5\' 6"  (1.676 m)   Wt 170 lb (77.1 kg)   SpO2 96%   BMI 27.44 kg/m   Physical Exam  Constitutional: She appears well-developed and well-nourished. She appears distressed.  HENT:  Head: Normocephalic and atraumatic.  Right Ear: External ear normal.  Eyes: Conjunctivae are normal.  Neck: Normal range of motion.  Cardiovascular: Normal rate and regular rhythm.   No murmur heard. Pulmonary/Chest: Accessory muscle usage present. No stridor. Tachypnea noted. She is in respiratory distress. She has decreased breath sounds. She has wheezes.  Abdominal: Soft. She exhibits no distension.  Musculoskeletal: She exhibits no edema or deformity.  Neurological: GCS eye subscore is 2. GCS verbal subscore is 2. GCS motor subscore is 1.  Skin: Skin is warm and dry.  Nursing note and vitals reviewed.    ED Treatments / Results  Labs (all  labs ordered are listed, but only abnormal results are  displayed) Labs Reviewed  BRAIN NATRIURETIC PEPTIDE - Abnormal; Notable for the following:       Result Value   B Natriuretic Peptide 380.0 (*)    All other components within normal limits  COMPREHENSIVE METABOLIC PANEL - Abnormal; Notable for the following:    Sodium 123 (*)    Chloride 81 (*)    Glucose, Bld 156 (*)    BUN 34 (*)    Creatinine, Ser 1.84 (*)    GFR calc non Af Amer 27 (*)    GFR calc Af Amer 32 (*)    All other components within normal limits  CBC WITH DIFFERENTIAL/PLATELET - Abnormal; Notable for the following:    WBC 18.0 (*)    Hemoglobin 9.8 (*)    HCT 32.3 (*)    MCH 24.1 (*)    RDW 16.3 (*)    Platelets 536 (*)    Neutro Abs 14.2 (*)    Monocytes Absolute 1.4 (*)    All other components within normal limits  PROTIME-INR - Abnormal; Notable for the following:    Prothrombin Time 22.9 (*)    All other components within normal limits  BLOOD GAS, ARTERIAL - Abnormal; Notable for the following:    pCO2 arterial 52.1 (*)    pO2, Arterial 74.8 (*)    Bicarbonate 30.4 (*)    All other components within normal limits  CBG MONITORING, ED - Abnormal; Notable for the following:    Glucose-Capillary 148 (*)    All other components within normal limits  I-STAT CG4 LACTIC ACID, ED - Abnormal; Notable for the following:    Lactic Acid, Venous 5.15 (*)    All other components within normal limits  CBG MONITORING, ED - Abnormal; Notable for the following:    Glucose-Capillary 206 (*)    All other components within normal limits  CULTURE, BLOOD (ROUTINE X 2)  CULTURE, BLOOD (ROUTINE X 2)  TROPONIN I  I-STAT CG4 LACTIC ACID, ED    EKG  EKG Interpretation None       Radiology Dg Chest Portable 1 View  Result Date: 07/29/2016 CLINICAL DATA:  Patient status post intubation.  Unresponsive. EXAM: PORTABLE CHEST 1 VIEW COMPARISON:  Chest radiograph 07/23/2016. FINDINGS: ET tube terminates in the mid  trachea. Enteric tube tip and side-port project over the stomach. Multiple monitoring leads overlie the patient. Pacer pads overlie the left hemi thorax. Stable enlarged cardiac and mediastinal contours. Interval increase in diffuse bilateral predominantly interstitial pulmonary opacities. Small bilateral pleural effusions. No pneumothorax. IMPRESSION: ET tube in the mid trachea. Cardiomegaly and increased bilateral interstitial pulmonary opacities most compatible with pulmonary edema. Small bilateral pleural effusions. Electronically Signed   By: Lovey Newcomer M.D.   On: 08/03/2016 19:07   Dg Chest Port 1v Same Day  Result Date: 08/09/2016 CLINICAL DATA:  Post central line placement EXAM: PORTABLE CHEST 1 VIEW COMPARISON:  07/30/2016 FINDINGS: Endotracheal tube tip is approximately 2.7 cm superior to the carina. Interim insertion of right-sided central venous catheter with tip overlying expected location of SVC. No right pneumothorax. Esophageal to tip is below the diaphragm. There is stable cardiomegaly with central vascular congestion. There is moderate interstitial and alveolar opacity, likely representing edema. This has increased. Small bilateral effusions. Surgical clips in the neck IMPRESSION: 1. Right sided central venous catheter tip overlies the SVC. No pneumothorax. 2. Cardiomegaly with central vascular congestion and worsening interstitial and alveolar airspace disease right greater than left compatible with pulmonary edema. Small bilateral effusions.  Electronically Signed   By: Donavan Foil M.D.   On: 07/25/2016 21:56    Procedures Procedure Name: Intubation Date/Time: 07/27/2016 12:01 AM Performed by: Merrily Pew Pre-anesthesia Checklist: Emergency Drugs available Oxygen Delivery Method: Non-rebreather mask Preoxygenation: Pre-oxygenation with 100% oxygen Intubation Type: Rapid sequence Ventilation: Unable to mask ventilate and Oral airway inserted - appropriate to patient size Grade  View: Grade I Tube type: Subglottic suction tube Tube size: 7.5 mm Airway Equipment and Method: Bougie stylet,  Stylet and Video-laryngoscopy Placement Confirmation: CO2 detector and Breath sounds checked- equal and bilateral Secured at: 22 cm Tube secured with: ETT holder Dental Injury: Bloody posterior oropharynx  Difficulty Due To: Difficulty was unanticipated, Difficult Airway- due to anterior larynx and Difficult Airway- due to large tongue Future Recommendations: Recommend- awake intubation Comments: Difficult intubation 2/2 quick desaturation and inability to oxygenate with BVM evenw ith oral airway. Very anteiror airway and large tongue makes DL difficult.     .Central Line Date/Time: 07/27/2016 12:05 AM Performed by: Merrily Pew Authorized by: Merrily Pew   Consent:    Consent obtained:  Emergent situation Pre-procedure details:    Hand hygiene: Hand hygiene performed prior to insertion     Sterile barrier technique: All elements of maximal sterile technique followed     Skin preparation:  Alcohol   Skin preparation agent: Skin preparation agent completely dried prior to procedure   Sedation:    Sedation type: sedation 2/2 fentanyl drip. Anesthesia (see MAR for exact dosages):    Anesthesia method:  Local infiltration   Local anesthetic:  Lidocaine 1% w/o epi Procedure details:    Location:  R internal jugular   Patient position:  Reverse Trendelenburg   Procedural supplies:  Triple lumen   Landmarks identified: no     Ultrasound guidance: yes     Sterile ultrasound techniques: Sterile gel and sterile probe covers were used     Number of attempts:  1   Successful placement: yes   Post-procedure details:    Post-procedure:  Dressing applied and line sutured   Assessment:  Blood return through all ports, free fluid flow, no pneumothorax on x-ray and placement verified by x-ray   Patient tolerance of procedure:  Tolerated well, no immediate  complications   Cardiopulmonary Resuscitation (CPR) Procedure Note Directed/Performed by: Merrily Pew I personally directed ancillary staff and/or performed CPR in an effort to regain return of spontaneous circulation and to maintain cardiac, neuro and systemic perfusion.   CRITICAL CARE Performed by: Merrily Pew Total critical care time: 65 minutes Critical care time was exclusive of separately billable procedures and treating other patients. Critical care was necessary to treat or prevent imminent or life-threatening deterioration. Critical care was time spent personally by me on the following activities: development of treatment plan with patient and/or surrogate as well as nursing, discussions with consultants, evaluation of patient's response to treatment, examination of patient, obtaining history from patient or surrogate, ordering and performing treatments and interventions, ordering and review of laboratory studies, ordering and review of radiographic studies, pulse oximetry and re-evaluation of patient's condition.   Medications Ordered in ED Medications  naloxone [facility]) 2 MG/2ML injection (not administered)  calcium chloride injection ( Intravenous Canceled Entry 07/25/2016 1845)  EPINEPHrine (ADRENALIN) 1 MG/10ML injection (1 mg Intravenous Given 08/09/2016 1823)  sodium bicarbonate injection (50 mEq Intravenous Given 08/15/2016 1817)  magnesium sulfate (IV Push/IM) injection (2 g Intravenous Canceled Entry 08/02/2016 1817)  atropine injection (1 mg Intravenous Given 07/25/2016 1827)  calcium gluconate  10 % injection (not administered)  calcium gluconate 10 % injection (not administered)  EPINEPHrine (ADRENALIN) 4 mg in dextrose 5 % 250 mL (0.016 mg/mL) infusion (0.5 mcg/min Intravenous New Bag/Given 07/24/2016 1922)  albuterol (PROVENTIL,VENTOLIN) solution continuous neb (10 mg/hr Nebulization New Bag/Given 08/06/2016 1957)  fentaNYL (SUBLIMAZE) 100 MCG/2ML injection (not administered)   midazolam (VERSED) 2 MG/2ML injection (not administered)  fentaNYL (SUBLIMAZE) 100 MCG/2ML injection (not administered)  midazolam (VERSED) 2 MG/2ML injection (not administered)  fentaNYL (SUBLIMAZE) 2,500 mcg in sodium chloride 0.9 % 250 mL (10 mcg/mL) infusion (not administered)  fentaNYL (SUBLIMAZE) bolus via infusion 25-100 mcg (not administered)  midazolam (VERSED) injection 1 mg (not administered)  naloxone (NARCAN) injection 0.4 mg (0.4 mg Intravenous Given 08/05/2016 1756)  etomidate (AMIDATE) injection 25 mg (25 mg Intravenous Given 08/11/2016 1806)  succinylcholine (ANECTINE) injection 100 mg (100 mg Intravenous Given 07/29/2016 1806)  fentaNYL (SUBLIMAZE) injection 50 mcg (50 mcg Intravenous Given 07/27/2016 1839)  midazolam (VERSED) injection 2 mg (2 mg Intravenous Given 08/10/2016 1839)  vancomycin (VANCOCIN) IVPB 1000 mg/200 mL premix (0 mg Intravenous Stopped 07/22/2016 2219)  ceFEPIme (MAXIPIME) 2 g in dextrose 5 % 50 mL IVPB (0 g Intravenous Stopped 07/29/2016 1920)  furosemide (LASIX) injection 80 mg (80 mg Intravenous Given 07/25/2016 1948)  fentaNYL (SUBLIMAZE) injection 50 mcg (50 mcg Intravenous Given 08/02/2016 1949)  midazolam (VERSED) injection 2 mg (2 mg Intravenous Given 08/07/2016 1949)  midazolam (VERSED) injection 2 mg (2 mg Intravenous Given 07/25/2016 2147)  fentaNYL (SUBLIMAZE) injection 50 mcg (50 mcg Intravenous Given 08/08/2016 2147)  LORazepam (ATIVAN) injection 2 mg (2 mg Intravenous Given 07/22/2016 2335)     Initial Impression / Assessment and Plan / ED Course  I have reviewed the triage vital signs and the nursing notes.  Pertinent labs & imaging results that were available during my care of the patient were reviewed by me and considered in my medical decision making (see chart for details).  Clinical Course     Ambulance called out for syncope and agonal respirations. Apparently initially responded to narcan however on arrival here was hypoxic requiring nonrebreather,  respiratory distress and not following commands. Decreased BS throughout with prolonged expiratory phase c/w possible COPD exacerbation. Attempted narcan again without improvement. Intubated for hypoxic respiratory failure and respiratory distress. Difficult intuation as documented above. Patient subsequently became hypoxic. After intuation, improved but then subsequently arrested. After one round of high quality chest compressions and epinephrine she had ROSC. After 10 minutes or so she arrested again this time returning in bradycardia that didn't improve and was resistant to atropine, so was electronically paced. cxr with possible pulmonary edema. Lactic acid/wbc high so treated with antibiotics. Unsure of cause of respiratory distress, pln for admission to ICU.  After reevaluation, it appeared the patient had underlying rhythm of her own. Pacer stopped and she had HR in 60's on epinephrine. Will dc pacer for now.   Final Clinical Impressions(s) / ED Diagnoses   Final diagnoses:  Acute respiratory failure with hypoxia ([place])  Cardiac arrest [facility])    New Prescriptions New Prescriptions   No medications on file     Merrily Pew, MD 07/27/16 0022

## 2016-07-26 NOTE — ED Triage Notes (Addendum)
Pt found down by family. EMS called out pt had agonal respirations. EMS arrived administered 1mg  of narcan with minimal response. CBG 151. Pt on nonrebreather. Per EDP assessment pupils unequal, pt intermittently following commands.

## 2016-07-26 NOTE — Progress Notes (Signed)
Unable to obtain ABG. MD made aware.

## 2016-07-26 NOTE — Pre-Procedure Instructions (Signed)
Called patient to see if she had been treated for staph or MRSA when she was an inpatient and her daughter-in-law answered and states that she didn't think the patient had been treated. She is going to ask patient when she returns and if she has not been treated, she will call us on Monday and come get her medication.

## 2016-07-26 NOTE — Code Documentation (Signed)
Pt heart rate 28. Pt being placed on pacer pads at this time with MD verbal order.

## 2016-07-26 NOTE — Code Documentation (Addendum)
Pt given shock at 200 J

## 2016-07-26 NOTE — Code Documentation (Signed)
HR check. Pt has no pulse at this time. Pt receiving shock. Pt in pulseless Vtach.

## 2016-07-27 ENCOUNTER — Other Ambulatory Visit: Payer: Self-pay

## 2016-07-27 ENCOUNTER — Encounter (HOSPITAL_COMMUNITY): Payer: Self-pay | Admitting: *Deleted

## 2016-07-27 ENCOUNTER — Inpatient Hospital Stay (HOSPITAL_COMMUNITY): Payer: Medicare Other

## 2016-07-27 DIAGNOSIS — J9601 Acute respiratory failure with hypoxia: Secondary | ICD-10-CM

## 2016-07-27 DIAGNOSIS — I469 Cardiac arrest, cause unspecified: Secondary | ICD-10-CM

## 2016-07-27 DIAGNOSIS — R001 Bradycardia, unspecified: Secondary | ICD-10-CM

## 2016-07-27 LAB — GLUCOSE, CAPILLARY
GLUCOSE-CAPILLARY: 136 mg/dL — AB (ref 65–99)
GLUCOSE-CAPILLARY: 129 mg/dL — AB (ref 65–99)
GLUCOSE-CAPILLARY: 199 mg/dL — AB (ref 65–99)
Glucose-Capillary: 231 mg/dL — ABNORMAL HIGH (ref 65–99)
Glucose-Capillary: 173 mg/dL — ABNORMAL HIGH (ref 65–99)

## 2016-07-27 LAB — POCT I-STAT 3, ART BLOOD GAS (G3+)
ACID-BASE EXCESS: 11 mmol/L — AB (ref 0.0–2.0)
BICARBONATE: 36.8 mmol/L — AB (ref 20.0–28.0)
O2 SAT: 100 %
PO2 ART: 171 mmHg — AB (ref 83.0–108.0)
Patient temperature: 37.3
TCO2: 38 mmol/L (ref 0–100)
pCO2 arterial: 52.6 mmHg — ABNORMAL HIGH (ref 32.0–48.0)
pH, Arterial: 7.454 — ABNORMAL HIGH (ref 7.350–7.450)

## 2016-07-27 LAB — BLOOD GAS, ARTERIAL
ACID-BASE EXCESS: 6.2 mmol/L — AB (ref 0.0–2.0)
Bicarbonate: 30.4 mmol/L — ABNORMAL HIGH (ref 20.0–28.0)
Bicarbonate: 30.7 mmol/L — ABNORMAL HIGH (ref 20.0–28.0)
DRAWN BY: 24686
Drawn by: 22223
FIO2: 100
FIO2: 100
LHR: 16 {breaths}/min
MECHVT: 500 mL
MECHVT: 500 mL
O2 SAT: 87.7 %
O2 Saturation: 93.5 %
PATIENT TEMPERATURE: 98.6
PCO2 ART: 49 mmHg — AB (ref 32.0–48.0)
PEEP/CPAP: 10 cmH2O
PEEP/CPAP: 5 cmH2O
PH ART: 7.413 (ref 7.350–7.450)
PO2 ART: 60.7 mmHg — AB (ref 83.0–108.0)
RATE: 20 resp/min
pCO2 arterial: 52.1 mmHg — ABNORMAL HIGH (ref 32.0–48.0)
pH, Arterial: 7.403 (ref 7.350–7.450)
pO2, Arterial: 74.8 mmHg — ABNORMAL LOW (ref 83.0–108.0)

## 2016-07-27 LAB — COMPREHENSIVE METABOLIC PANEL
ALBUMIN: 3.3 g/dL — AB (ref 3.5–5.0)
ALT: 16 U/L (ref 14–54)
ANION GAP: 14 (ref 5–15)
AST: 29 U/L (ref 15–41)
Alkaline Phosphatase: 71 U/L (ref 38–126)
BUN: 26 mg/dL — ABNORMAL HIGH (ref 6–20)
CHLORIDE: 87 mmol/L — AB (ref 101–111)
CO2: 30 mmol/L (ref 22–32)
CREATININE: 1.53 mg/dL — AB (ref 0.44–1.00)
Calcium: 9.1 mg/dL (ref 8.9–10.3)
GFR calc Af Amer: 40 mL/min — ABNORMAL LOW (ref >60)
GFR calc non Af Amer: 34 mL/min — ABNORMAL LOW (ref >60)
GLUCOSE: 145 mg/dL — AB (ref 65–99)
Potassium: 3.5 mmol/L (ref 3.5–5.1)
SODIUM: 131 mmol/L — AB (ref 135–145)
Total Bilirubin: 1.1 mg/dL (ref 0.3–1.2)
Total Protein: 5.8 g/dL — ABNORMAL LOW (ref 6.5–8.1)

## 2016-07-27 LAB — CBC WITH DIFFERENTIAL/PLATELET
Basophils Absolute: 0 10*3/uL (ref 0.0–0.1)
Basophils Relative: 0 %
Eosinophils Absolute: 0 10*3/uL (ref 0.0–0.7)
Eosinophils Relative: 0 %
HEMATOCRIT: 26.3 % — AB (ref 36.0–46.0)
Hemoglobin: 8.2 g/dL — ABNORMAL LOW (ref 12.0–15.0)
LYMPHS PCT: 10 %
Lymphs Abs: 1.4 10*3/uL (ref 0.7–4.0)
MCH: 23.6 pg — ABNORMAL LOW (ref 26.0–34.0)
MCHC: 31.2 g/dL (ref 30.0–36.0)
MCV: 75.6 fL — AB (ref 78.0–100.0)
MONO ABS: 1.3 10*3/uL — AB (ref 0.1–1.0)
MONOS PCT: 9 %
NEUTROS ABS: 11.6 10*3/uL — AB (ref 1.7–7.7)
Neutrophils Relative %: 81 %
Platelets: 357 10*3/uL (ref 150–400)
RBC: 3.48 MIL/uL — ABNORMAL LOW (ref 3.87–5.11)
RDW: 16.3 % — AB (ref 11.5–15.5)
WBC: 14.3 10*3/uL — ABNORMAL HIGH (ref 4.0–10.5)

## 2016-07-27 LAB — BASIC METABOLIC PANEL
ANION GAP: 13 (ref 5–15)
BUN: 25 mg/dL — AB (ref 6–20)
CALCIUM: 9 mg/dL (ref 8.9–10.3)
CO2: 29 mmol/L (ref 22–32)
Chloride: 87 mmol/L — ABNORMAL LOW (ref 101–111)
Creatinine, Ser: 1.8 mg/dL — ABNORMAL HIGH (ref 0.44–1.00)
GFR calc Af Amer: 33 mL/min — ABNORMAL LOW (ref >60)
GFR calc non Af Amer: 28 mL/min — ABNORMAL LOW (ref >60)
GLUCOSE: 238 mg/dL — AB (ref 65–99)
Potassium: 4.2 mmol/L (ref 3.5–5.1)
Sodium: 129 mmol/L — ABNORMAL LOW (ref 135–145)

## 2016-07-27 LAB — I-STAT TROPONIN, ED: TROPONIN I, POC: 0.01 ng/mL (ref 0.00–0.08)

## 2016-07-27 LAB — MAGNESIUM: Magnesium: 1.3 mg/dL — ABNORMAL LOW (ref 1.7–2.4)

## 2016-07-27 LAB — MRSA PCR SCREENING: MRSA by PCR: POSITIVE — AB

## 2016-07-27 LAB — LACTIC ACID, PLASMA: Lactic Acid, Venous: 1.3 mmol/L (ref 0.5–1.9)

## 2016-07-27 MED ORDER — SODIUM CHLORIDE 0.9 % IV SOLN
1.0000 mg/h | INTRAVENOUS | Status: DC
  Administered 2016-07-27: 3 mg/h via INTRAVENOUS
  Administered 2016-07-27: 1 mg/h via INTRAVENOUS
  Filled 2016-07-27 (×2): qty 10

## 2016-07-27 MED ORDER — POTASSIUM CHLORIDE 10 MEQ/50ML IV SOLN
10.0000 meq | INTRAVENOUS | Status: AC
  Administered 2016-07-27 (×4): 10 meq via INTRAVENOUS
  Filled 2016-07-27 (×4): qty 50

## 2016-07-27 MED ORDER — SODIUM CHLORIDE 0.9% FLUSH: 10.0000 mL | INTRAVENOUS | Status: DC | PRN

## 2016-07-27 MED ORDER — ATROPINE SULFATE 1 MG/ML IJ SOLN: 0.4000 mg | Freq: Once | INTRAMUSCULAR | Status: AC

## 2016-07-27 MED ORDER — ATROPINE SULFATE 1 MG/10ML IJ SOSY
PREFILLED_SYRINGE | INTRAMUSCULAR | Status: AC
  Administered 2016-07-27: 0.4 mg
  Filled 2016-07-27: qty 10

## 2016-07-27 MED ORDER — AMIODARONE HCL IN DEXTROSE 360-4.14 MG/200ML-% IV SOLN: 30.0000 mg/h | INTRAVENOUS | Status: DC

## 2016-07-27 MED ORDER — ORAL CARE MOUTH RINSE
15.0000 mL | Freq: Four times a day (QID) | OROMUCOSAL | Status: DC
  Administered 2016-07-27 (×2): 15 mL via OROMUCOSAL

## 2016-07-27 MED ORDER — ACETAMINOPHEN 160 MG/5ML PO SOLN
650.0000 mg | ORAL | Status: DC | PRN
  Administered 2016-07-27: 650 mg
  Filled 2016-07-27: qty 20.3

## 2016-07-27 MED ORDER — DOPAMINE-DEXTROSE 3.2-5 MG/ML-% IV SOLN
0.0000 ug/kg/min | INTRAVENOUS | Status: DC
  Administered 2016-07-27: 10 ug/kg/min via INTRAVENOUS
  Filled 2016-07-27: qty 250

## 2016-07-27 MED ORDER — DOPAMINE-DEXTROSE 3.2-5 MG/ML-% IV SOLN
INTRAVENOUS | Status: AC
  Filled 2016-07-27: qty 250

## 2016-07-27 MED ORDER — CHLORHEXIDINE GLUCONATE 0.12% ORAL RINSE (MEDLINE KIT)
15.0000 mL | Freq: Two times a day (BID) | OROMUCOSAL | Status: DC
  Administered 2016-07-27 (×2): 15 mL via OROMUCOSAL

## 2016-07-27 MED ORDER — HEPARIN SODIUM (PORCINE) 5000 UNIT/ML IJ SOLN
5000.0000 [IU] | Freq: Three times a day (TID) | INTRAMUSCULAR | Status: DC
  Administered 2016-07-27: 5000 [IU] via SUBCUTANEOUS
  Filled 2016-07-27: qty 1

## 2016-07-27 MED ORDER — SODIUM CHLORIDE 0.9 % IV BOLUS (SEPSIS)
500.0000 mL | Freq: Once | INTRAVENOUS | Status: AC
  Administered 2016-07-27: 500 mL via INTRAVENOUS

## 2016-07-27 MED ORDER — MAGNESIUM SULFATE 4 GM/100ML IV SOLN
4.0000 g | Freq: Once | INTRAVENOUS | Status: AC
  Administered 2016-07-27: 4 g via INTRAVENOUS
  Filled 2016-07-27: qty 100

## 2016-07-27 MED ORDER — MAGNESIUM SULFATE 2 GM/50ML IV SOLN
2.0000 g | Freq: Once | INTRAVENOUS | Status: AC
  Administered 2016-07-27: 2 g via INTRAVENOUS
  Filled 2016-07-27: qty 50

## 2016-07-27 MED ORDER — MIDAZOLAM 50MG/50ML (1MG/ML) PREMIX INFUSION
INTRAVENOUS | Status: AC
  Filled 2016-07-27: qty 50

## 2016-07-27 MED ORDER — FENTANYL 2500MCG IN NS 250ML (10MCG/ML) PREMIX INFUSION
25.0000 ug/h | INTRAVENOUS | Status: DC
  Administered 2016-07-27: 100 ug/h via INTRAVENOUS
  Filled 2016-07-27: qty 250

## 2016-07-27 MED ORDER — FAMOTIDINE IN NACL 20-0.9 MG/50ML-% IV SOLN
20.0000 mg | INTRAVENOUS | Status: DC
  Administered 2016-07-27: 20 mg via INTRAVENOUS
  Filled 2016-07-27: qty 50

## 2016-07-27 MED ORDER — AMIODARONE HCL IN DEXTROSE 360-4.14 MG/200ML-% IV SOLN
60.0000 mg/h | INTRAVENOUS | Status: AC
  Administered 2016-07-27 (×2): 60 mg/h via INTRAVENOUS
  Filled 2016-07-27 (×2): qty 200

## 2016-07-27 MED ORDER — INSULIN ASPART 100 UNIT/ML ~~LOC~~ SOLN
0.0000 [IU] | SUBCUTANEOUS | Status: DC
  Administered 2016-07-27 (×2): 2 [IU] via SUBCUTANEOUS

## 2016-07-27 MED ORDER — MUPIROCIN 2 % EX OINT
1.0000 "application " | TOPICAL_OINTMENT | Freq: Two times a day (BID) | CUTANEOUS | Status: DC
  Administered 2016-07-27 (×2): 1 via NASAL
  Filled 2016-07-27: qty 22

## 2016-07-27 MED ORDER — SODIUM CHLORIDE 0.9% FLUSH
10.0000 mL | Freq: Two times a day (BID) | INTRAVENOUS | Status: DC
  Administered 2016-07-27: 10 mL

## 2016-07-27 MED ORDER — ATROPINE SULFATE 1 MG/10ML IJ SOSY
PREFILLED_SYRINGE | INTRAMUSCULAR | Status: AC
  Filled 2016-07-27: qty 20

## 2016-07-27 MED ORDER — "THROMBI-PAD 3""X3"" EX PADS"
1.0000 | MEDICATED_PAD | Freq: Once | CUTANEOUS | Status: AC
  Administered 2016-07-27: 1 via TOPICAL
  Filled 2016-07-27: qty 1

## 2016-07-27 MED ORDER — AMIODARONE LOAD VIA INFUSION
150.0000 mg | Freq: Once | INTRAVENOUS | Status: AC
  Administered 2016-07-27: 150 mg via INTRAVENOUS
  Filled 2016-07-27: qty 83.34

## 2016-07-27 MED ORDER — CHLORHEXIDINE GLUCONATE CLOTH 2 % EX PADS
6.0000 | MEDICATED_PAD | Freq: Every day | CUTANEOUS | Status: DC
  Administered 2016-07-27: 6 via TOPICAL

## 2016-07-27 NOTE — Progress Notes (Signed)
eLink Physician-Brief Progress Note Patient Name: [name] DOB: September 19, 1950 MRN: [id number]   Date of Service  07/27/2016  HPI/Events of Note  Multiple issues: 1. Hypovolemia - CVP = 7-8 and 2. No AM labs ordered.   eICU Interventions  Will order: 1.Bolus with 0.9 NaCl 500 mL IV over 30 minutes now.  2. CBC, CMP and Mg++ level at 5 AM.        Lysle Dingwall 07/27/2016, 5:02 AM

## 2016-07-27 NOTE — Progress Notes (Addendum)
  Patient Name: [name]   MRN: [id number]   Date of Birth/ Sex: March 08, 1951 , female      Admission Date: 07/21/2016  Attending Provider: Marshell Garfinkel, MD  Primary Diagnosis: <principal problem not specified>   Indication: Pt was in her usual state of health until this 10.26 AM, when she was noted to be in a systole. Code blue was subsequently called. At the time of arrival on scene, ACLS protocol was underway. She had a cardiac arrest at home, rested again in the [facility] ER intubation, again with VT/fib,This morning around 7 AM. He then had another episode of asystole at 10:26 AM, requiring 4 minutes of CPR. CODE BLUE was called again around noon. Patient again developed bradycardia and asystole, was placed on percutaneous pacing. Family decided to make her DO NOT RESUSCITATE, just pursue medical management, and comfort measures. She was bradycardic, having percutaneous pacing, maintaining her blood pressure, completely unresponsive, on ventilator when we left the room.   Technical Description:  - CPR performance duration:                                          4 minutes  - Was defibrillation or cardioversion used? NO   - Was external pacer placed?  yes   - Was patient intubated pre/post CPR? yes    Medications Administered: Y = Yes; Blank = No Amiodarone  y  Atropine  y  Calcium    Epinephrine  y  Lidocaine    Magnesium    Norepinephrine    Phenylephrine    Sodium bicarbonate    Vasopressin  y   Post CPR evaluation:  - Final Status - Was patient successfully resuscitated ? Yes - What is current rhythm? Bradycardia/Transcutaneous paced. - What is current hemodynamic status? Unstable  Miscellaneous Information:  - Labs sent, including: No  - Primary team notified?  Yes  - Family Notified? Yes  - Additional notes/ transfer status: PCCM team took over.     Lorella Nimrod, MD  07/27/2016, 10:47 AM

## 2016-07-27 NOTE — Progress Notes (Signed)
[facility] Called emergently to code blue call this morning. Chart review and case discussed with Dr. Elsworth Soho.  Advanced pulmonary hypertension, COPD, vascular disease s/p AKA who was admitted with acute respiratory failure with hypoxemia overnight.  She had a cardiac arrest at home, again in the [place] with intubation, again with VT/Fib this morning, and then again upon my arrival around 10:40.  This time she required about 4 minutes of CPR when she developed asystole.  After my arrival she was bradycardic and had a normal blood pressure.  We started transcutaneous pacing due to recurrent bradycardia.    On exam Skin Grey in color Lungs clear with vent supported breaths CV: RRR, no mgr GI: no bowel sounds, mildly distended MSK: s/p AKA  BMET    Component Value Date/Time   NA 131 (L) 07/27/2016 0530   K 3.5 07/27/2016 0530   CL 87 (L) 07/27/2016 0530   CO2 30 07/27/2016 0530   GLUCOSE 145 (H) 07/27/2016 0530   BUN 26 (H) 07/27/2016 0530   CREATININE 1.53 (H) 07/27/2016 0530   CALCIUM 9.1 07/27/2016 0530   GFRNONAA 34 (L) 07/27/2016 0530   GFRAA 40 (L) 07/27/2016 0530   Impression: Advanced cor pulmonale Acute respiratory failure with hypoxemia COPD AKI Cardiac arrest > bradycardia  Discussion: Family updated bedside at length.  They state they understand the severity of the situation and feel this is related to her weak heart. I explained that she has had several cardiac arrests in a row and the likelihood of another was high, further her likelihood of survival is very low given her multiple comorbid illnesses.  They voiced understanding.  Discussed with cardiology as well who also agrees.  Plan: Code status changed to DNR Continue current management ABG now Repeat BMET now  Suspect she will pass here today based on this time course  Additional CC time 45 minutes  Roselie Awkward, MD [facility] Pager: [phone] Cell: [phone] After 3pm or if no response, call  [phone]

## 2016-07-27 NOTE — Progress Notes (Signed)
eLink Physician-Brief Progress Note Patient Name: [name] DOB: 06-16-1951 MRN: [id number]   Date of Service  07/27/2016  HPI/Events of Note  Multiple issues: 1. Blood glucose = 145 and episode of VT - looks like [place] on monitor. Cardioverted out of rhythm.  eICU Interventions  Will order: 1. Magnesium Sulfate 2 gm IV now.  2. Q 4 hour sensitive Novolog SSI.     Intervention Category Major Interventions: Arrhythmia - evaluation and management;Hyperglycemia - active titration of insulin therapy  Jeydi Klingel Cornelia Copa 07/27/2016, 6:54 AM

## 2016-07-27 NOTE — Code Documentation (Signed)
Patient noted to be in asystole, and CPR started immediately. CPR lasted for 38min with ROSC and Transcutaneous pacing at 70. See MD note and code sheet. Family made aware and at bedside following code.

## 2016-07-27 NOTE — Consult Note (Signed)
Cardiology Consult    Patient ID: [name] MRN: [id number], DOB/AGE: [birth year]   Admit date: 08/02/2016 Date of Consult: 07/27/2016  Primary Physician: Maricela Curet, MD Primary Cardiologist: D. Bensimhon, MD  Requesting Provider: R. Elsworth Soho, MD  Patient Profile    65 y/o ? with a h/o CAD, diast CHF, COPD, severe PAD s/p R BKA, carotid dzs s/p bilat CEA, DM2, HTN, HL, Aflutter, and PAH, who we have been asked to eval 2/2 cardiac arrest.  Past Medical History   Past Medical History:  Diagnosis Date  . Anemia   . Anxiety   . Arthritis    "back" (10/30/2015)  . CHF (congestive heart failure) ([place])   . Chronic lower back pain   . COPD (chronic obstructive pulmonary disease) ([place])   . GERD (gastroesophageal reflux disease)   . History of atrial fibrillation   . Hyperlipidemia   . Hypertension   . Hypoxia   . On home oxygen therapy    "2L; 24/7" (07/22/2016)  . Peripheral vascular disease ([place])   . Respiratory failure ([place])   . Shingles Dec. 2016   Scalp  . Sleep apnea    uses o2 at 3 liters at night  . Stroke [facility])    no deficits  . Type II diabetes mellitus ([place])     Past Surgical History:  Procedure Laterality Date  . AMPUTATION Right 10/30/2015   Procedure: RIGHT ABOVE KNEE AMPUTATION;  Surgeon: Elam Dutch, MD;  Location: [place];  Service: Vascular;  Laterality: Right;  . BACK SURGERY    . CARDIAC CATHETERIZATION    . CARDIAC CATHETERIZATION N/A 04/18/2016   Procedure: Right/Left Heart Cath and Coronary Angiography;  Surgeon: Jolaine Artist, MD;  Location: [place] CV LAB;  Service: Cardiovascular;  Laterality: N/A;  . CARDIAC CATHETERIZATION N/A 04/18/2016   Procedure: Coronary Stent Intervention;  Surgeon: Belva Crome, MD;  Location: [place] CV LAB;  Service: Cardiovascular;  Laterality: N/A;  . CARDIOVERSION N/A 06/14/2016   Procedure: CARDIOVERSION;  Surgeon: Jolaine Artist, MD;  Location: [facility] ENDOSCOPY;  Service: Cardiovascular;   Laterality: N/A;  . CARDIOVERSION N/A 07/24/2016   Procedure: CARDIOVERSION;  Surgeon: Jolaine Artist, MD;  Location: [facility] ENDOSCOPY;  Service: Cardiovascular;  Laterality: N/A;  . CAROTID ENDARTERECTOMY Right 2010   Dr Kellie Simmering   . CAROTID ENDARTERECTOMY Left 03/2008   Archie Endo 01/15/2011  . FASCIOTOMY Right 04/10/2015   Procedure: FOUR COMPARTMENT FASCIOTOMY;  Surgeon: Elam Dutch, MD;  Location: [place];  Service: Vascular;  Laterality: Right;  . FEMORAL-POPLITEAL BYPASS GRAFT Right 04/10/2015   Procedure: RIGHT  FEMORAL-BELOW THE KNEE POPLITEAL ARTERY BYPASS GRAFT USING 6 MM X 80 CM PROPATEN GRAFT;  Surgeon: Elam Dutch, MD;  Location: [place];  Service: Vascular;  Laterality: Right;  . FOOT SURGERY Bilateral    "from standing on concrete all day"  . INTRAOPERATIVE ARTERIOGRAM Right 04/10/2015   Procedure: INTRA OPERATIVE ARTERIOGRAM;  Surgeon: Elam Dutch, MD;  Location: [place];  Service: Vascular;  Laterality: Right;  . LAPAROSCOPIC CHOLECYSTECTOMY    . LEG AMPUTATION THROUGH KNEE Right 10/30/2015  . PERIPHERAL VASCULAR CATHETERIZATION N/A 04/10/2015   Procedure: Abdominal Aortogram w/Lower Extremity;  Surgeon: Conrad Pulaski, MD;  Location: [place] CV LAB;  Service: Cardiovascular;  Laterality: N/A;  . PERIPHERAL VASCULAR CATHETERIZATION N/A 10/20/2015   Procedure: Abdominal Aortogram;  Surgeon: Elam Dutch, MD;  Location: [place] CV LAB;  Service: Cardiovascular;  Laterality: N/A;  .  POSTERIOR LUMBAR FUSION    . SHOULDER OPEN ROTATOR CUFF REPAIR Right 02/2006   Archie Endo 01/29/2011  . THROMBECTOMY FEMORAL ARTERY Right 04/10/2015   Procedure: THROMBECTOMY RIGHT TIBIAL  ARTERY WITH VEIN PATCH ANGIOPLASTY;  Surgeon: Elam Dutch, MD;  Location: [place];  Service: Vascular;  Laterality: Right;     Allergies  Allergies  Allergen Reactions  . Codeine Rash  . Penicillins Other (See Comments)    Has patient had a PCN reaction causing immediate rash, facial/tongue/throat  swelling, SOB or lightheadedness with hypotension: unknown Has patient had a PCN reaction causing severe rash involving mucus membranes or skin necrosis: unknown Has patient had a PCN reaction that required hospitalization unknown Has patient had a PCN reaction occurring within the last 10 years: unknown If all of the above answers are "NO", then may proceed with Cephalosporin use.  . Ciprofloxacin Hives    History of Present Illness    [name] is a 65 y.o. with hx of Chronic Diastolic CHF (LVEF 07-37%, PA peak pressure 68 mm Hg 04/09/16), COPD, severe PAD s/p R BKA 2/17, carotid stenosis s/p B CEA, DM2, HTN, HL, and Chronic pain. She has been followed by Dr. Acie Fredrickson for sinus tachycardia (versus SVT) and diastolic HF.    She presented to [facility] ED 04/08/16 with AMS, generalized edema, and SOB and also found to be in new onset atrial flutter.  She was diuresed and started on low dose amio to help with rate control, and started on Eliquis for anticoag.   Pt went for [facility] 04/18/16 with marginal cardiac output, moderate PAH, and 2 vessel CAD with high-grade mid LAD lesion.  Pt underwent successful PCI with stenting of the mid LAD with 0% residual stenosis.   Seen in HF Clinic recently. Volume status ok but found to be back in AFL. Scheduled for elective DC-CV last week but presented with volume overload.  She was admitted and diuresed successfully.  Following d/c she was home and suffered syncope.  She was found unresponsive by family and EMS was called.  She was breathing agonally and had a pulse @ the time and was intubated.  She was taken to [facility] where she suffered cardiac arrest req ACLS.  She was tx to [facility] for further eval. This AM, she suffered another arrest in the setting hypomagnesemia (1.3) with torsades req defib.  Mg was supplemented.  She was bradycardic post-arrest and dopamine was started by critical care.  Later this AM, she became profoundly bradycardic req placement of transcutaneous  pads.  She remains intub and on dopamine.  We've been asked to eval.    Inpatient Medications    . atropine      . chlorhexidine gluconate (MEDLINE KIT)  15 mL Mouth Rinse BID  . Chlorhexidine Gluconate Cloth  6 each Topical Q0600  . famotidine (PEPCID) IV  20 mg Intravenous Q24H  . heparin  5,000 Units Subcutaneous Q8H  . insulin aspart  0-9 Units Subcutaneous Q4H  . mouth rinse  15 mL Mouth Rinse QID  . mupirocin ointment  1 application Nasal BID  . potassium chloride  10 mEq Intravenous Q1 Hr x 4  . sodium chloride flush  10-40 mL Intracatheter Q12H    Family History   **obtained from prior records. Family History  Problem Relation Age of Onset  . Cancer Father   . Heart attack Sister   . Stroke Sister   . Hypertension Sister   . Hypertension Brother  Social History   **obtained from prior records. Social History   Social History  . Marital status: Married    Spouse name: N/A  . Number of children: N/A  . Years of education: N/A   Occupational History  . Not on file.   Social History Main Topics  . Smoking status: Former Smoker    Packs/day: 0.12    Years: 49.00    Types: Cigarettes    Quit date: 09/10/2015  . Smokeless tobacco: Never Used  . Alcohol use No  . Drug use: No  . Sexual activity: No   Other Topics Concern  . Not on file   Social History Narrative  . No narrative on file     Review of Systems    Intubated - not able to provide.  Physical Exam    Blood pressure (!) 127/31, pulse (!) 31, temperature (!) 102 F (38.9 C), resp. rate (!) 21, height _0  (1.651 m), weight 175 lb 14.8 oz (79.8 kg), SpO2 95 %.  General: intubated, somnolent, unresponsive, not sedated. Psych: somnolent Neuro: somnolent HEENT: Normal  Neck: Supple without bruits or JVD. Lungs:  Resp regular and unlabored, coarse breath sounds. Heart: RRR no s3, s4, or murmurs. Abdomen: Soft, non-tender, non-distended, BS + x 4.  Extremities: No clubbing, cyanosis or  edema. R bka.  Labs    Troponin [facility] Test)  Recent Labs  08/01/2016 1803  TROPIPOC 0.01    Recent Labs  08/06/2016 1800  TROPONINI <0.03   Lab Results  Component Value Date   WBC 14.3 (H) 07/27/2016   HGB 8.2 (L) 07/27/2016   HCT 26.3 (L) 07/27/2016   MCV 75.6 (L) 07/27/2016   PLT 357 07/27/2016    Recent Labs Lab 07/27/16 0530  NA 131*  K 3.5  CL 87*  CO2 30  BUN 26*  CREATININE 1.53*  CALCIUM 9.1  PROT 5.8*  BILITOT 1.1  ALKPHOS 71  ALT 16  AST 29  GLUCOSE 145*   Lab Results  Component Value Date   CHOL 80 04/12/2015   HDL 32 (L) 04/12/2015   LDLCALC 37 04/12/2015   TRIG 53 04/12/2015     Radiology Studies    Dg Chest 2 View  Result Date: 07/23/2016 CLINICAL DATA:  COPD EXAM: CHEST  2 VIEW COMPARISON:  05/13/2016 FINDINGS: Cardiomegaly is noted. Mild interstitial prominence bilateral without convincing pulmonary edema. Mild basilar atelectasis. No segmental infiltrate. Degenerative changes bilateral shoulders. IMPRESSION: Cardiomegaly. Mild interstitial prominence bilateral without convincing pulmonary edema. Mild basilar atelectasis. Degenerative changes bilateral shoulders. Electronically Signed   By: Lahoma Crocker M.D.   On: 07/23/2016 10:45   Dg Chest Port 1 View  Result Date: 07/27/2016 CLINICAL DATA:  Acute hypoxemic respiratory failure. EXAM: PORTABLE CHEST 1 VIEW COMPARISON:  Chest radiograph from one day prior. FINDINGS: Endotracheal tube tip is 3.0 cm above the carina. Enteric tube enters stomach with the tip not seen on this image. Right internal jugular central venous catheter terminates in the middle third of the superior vena cava. Stable cardiomediastinal silhouette with mild cardiomegaly and aortic atherosclerosis. No pneumothorax. Stable small right pleural effusion. Stable trace left pleural effusion. Mild-to-moderate pulmonary edema appears slightly improved. Stable bibasilar opacities, favor atelectasis. IMPRESSION: 1. Support  structures as described. No pneumothorax. 2. Mild-to-moderate congestive heart failure, slightly improved. 3. Stable small right and trace left pleural effusions and bibasilar lung opacities, favor atelectasis. Electronically Signed   By: Ilona Sorrel M.D.   On: 07/27/2016 09:18  Dg Chest Portable 1 View  Result Date: 07/17/2016 CLINICAL DATA:  Patient status post intubation.  Unresponsive. EXAM: PORTABLE CHEST 1 VIEW COMPARISON:  Chest radiograph 07/23/2016. FINDINGS: ET tube terminates in the mid trachea. Enteric tube tip and side-port project over the stomach. Multiple monitoring leads overlie the patient. Pacer pads overlie the left hemi thorax. Stable enlarged cardiac and mediastinal contours. Interval increase in diffuse bilateral predominantly interstitial pulmonary opacities. Small bilateral pleural effusions. No pneumothorax. IMPRESSION: ET tube in the mid trachea. Cardiomegaly and increased bilateral interstitial pulmonary opacities most compatible with pulmonary edema. Small bilateral pleural effusions. Electronically Signed   By: Lovey Newcomer M.D.   On: 07/22/2016 19:07   Dg Chest Port 1v Same Day  Result Date: 07/23/2016 CLINICAL DATA:  Post central line placement EXAM: PORTABLE CHEST 1 VIEW COMPARISON:  08/10/2016 FINDINGS: Endotracheal tube tip is approximately 2.7 cm superior to the carina. Interim insertion of right-sided central venous catheter with tip overlying expected location of SVC. No right pneumothorax. Esophageal to tip is below the diaphragm. There is stable cardiomegaly with central vascular congestion. There is moderate interstitial and alveolar opacity, likely representing edema. This has increased. Small bilateral effusions. Surgical clips in the neck IMPRESSION: 1. Right sided central venous catheter tip overlies the SVC. No pneumothorax. 2. Cardiomegaly with central vascular congestion and worsening interstitial and alveolar airspace disease right greater than left  compatible with pulmonary edema. Small bilateral effusions. Electronically Signed   By: Donavan Foil M.D.   On: 07/21/2016 21:56    ECG & Cardiac Imaging    jxnl rhythm, 43, right axis, ivcd, inflat st/t changes, prolonged QT  Assessment & Plan    1.  Cardiac and Respiratory Arrest:  Pt had syncope @ home and was found unresponsive by family.  Agonal breathing per EMS.  Taken to [facility] ED and suffered cardiac arrest req ACLS.  She remains intubated, somnolent.  Has suffered two additional arrests with torsades in the setting of hypomagnesemia (supplemented) and bradycardia in the setting of hypoxia/self extubation.  Pacer pads in place and currently on iv dopamine.  Bedside echo performed earlier with EF of 40% by initial review @ the time.  Family wishes to avoid aggressive measures.  Continue medical therapy to avoid bradycardia/asystole/recurrent VT/torsades.  Signed, Murray Hodgkins, NP 07/27/2016, 11:36 AM  The patient was seen, examined and discussed with Carmela Rima, NP and agree as above.  65 year old female with h/o CAD, diast CHF, COPD, severe PAD s/p R BKA, carotid dzs s/p bilat CEA, DM2, HTN, HL, Aflutter, and PAH, who we have been asked to eval 2/2 cardiac arrest. She has sustained 3 arrests in the last 24 hours, she was admitted to [facility] after found unresponsive by family, she had cardiac arrest in the hospital while being intubated, another one this am while Mg 1.3 with bradycardia and another one after self extubation. Currently on Dopamine drip and transcutaneous pacing.  The family states that this is the 5th episode of arrest and they would not like to continue any aggressive measures or perform another CPR. We will try to wean off the pacer, K 4.2, Ca 9.0, we will recheck Mg, Blood gases don't show significant acidosis.   Ena Dawley 07/27/2016

## 2016-07-27 NOTE — Progress Notes (Signed)
Patient had episode of v-tach sustained, called a code shock was given x1 and back to sinus rhythm with pulse after shock was given. Patient more agitated and restarted sedation drip, will hold CT scan for now per Dr. Emmit Alexanders.

## 2016-07-27 NOTE — Progress Notes (Signed)
   07/27/16 1100  Clinical Encounter Type  Visited With Family  Visit Type Code  Referral From Nurse  Consult/Referral To Chaplain  Spiritual Encounters  Spiritual Needs Emotional  CHP stayed with family in waiting area during code.  Offered presence and emotional support.  Patient stabilized but still critical.  CHP will check back throughout the day. Roe Coombs 07/27/16

## 2016-07-27 NOTE — Progress Notes (Signed)
Initial Nutrition Assessment  DOCUMENTATION CODES:   Not applicable  INTERVENTION:   -If pt unable to extubate within 48 hours of intubation, recommend:  Initiate TF via OGT with Vital AF 1.2 at goal rate of 65 ml/h (1560 ml per day) to provide 1872 kcals, 117 gm protein, 1265 ml free water daily.  NUTRITION DIAGNOSIS:   Inadequate oral intake related to inability to eat as evidenced by NPO status.  GOAL:   Patient will meet greater than or equal to 90% of their needs  MONITOR:   Vent status, Labs, Skin, I & O's  REASON FOR ASSESSMENT:   Malnutrition Screening Tool, Ventilator    ASSESSMENT:   65 yo CF with unwitnessed arrest at home and cardiac arrest in the ED.  I suspect, given her history of pulmonary hypertension and O2 non adherence she had profound hypoxic vasoconstriction with acute pressure overload of RV with resulting arrest.  Type I ischemic event seems less likely, and PE/VTE seems highly unlikely given her duel anticoagulation.  Pt admitted with acute on chronic respiratory failure and cardiac arrest.   Patient is currently intubated on ventilator support. OGT noted. MV: 10.0 L/min Temp (24hrs), Avg:99.5 F (37.5 C), Min:95.2 F (35.1 C), Max:102 F (38.9 C)  Case discussed with RN. Pt coded x 2 after transfer from [facility] ED. Pt vomited and has OGT connected to low-intermittent suction. Per RN, no plan to start TF today due to medical instability.   Nutrition-Focused physical exam completed. Findings are no fat depletion, no muscle depletion, and mild edema.   Labs reviewed: Na: 131, CBGS: 129-136.   Diet Order:   NPO  Skin:  Reviewed, no issues  Last BM:  PTA  Height:   Ht Readings from Last 1 Encounters:  07/27/16 5\' 5"  (1.651 m)    Weight:   Wt Readings from Last 1 Encounters:  07/27/16 175 lb 14.8 oz (79.8 kg)    Ideal Body Weight:  56.8 kg  BMI:  Body mass index is 29.28 kg/m.  Estimated Nutritional Needs:   Kcal:   1883.9  Protein:  100-115 grams  Fluid:  1.8-2.0 L  EDUCATION NEEDS:   No education needs identified at this time  [name] A. Jimmye Norman, RD, LDN, CDE Pager: [phone] After hours Pager: [phone]

## 2016-07-27 NOTE — Progress Notes (Addendum)
PULMONARY / CRITICAL CARE MEDICINE   Name: [name] MRN: [id number] DOB: 12-Feb-1951    ADMISSION DATE:  08/04/2016  CHIEF COMPLAINT:  Bradycardic arrest/Respiratory arrest  HISTORY OF PRESENT ILLNESS:    65 y.o.with hx of pulmonary hypertension, Chronic Diastolic CHF (LVEF 99991111, PA peak pressure 68 mm Hg 04/09/16), COPD, VTE (on Plavix and xarelto) severe PAD s/p R BKA 2/17, carotid stenosis s/p B CEA, DM2, HTN, HL, and Chronic pain.  She is intubated and unable to provide history.  Her husband and family friend state that on 07/24/2016 she was admitted for an elective DC cardioversion for Afib/flutter.  She tolerated that well and had not complaints until this morning, she was out with her husband and was not wearing her home O2.  Her husband states her O2 sats were in the 50s-70s for many hours and she was even cyanotic for a period of time.  Then returned home, she put her O2 on and he left the room.  He then heard her hit the floor and she was unresponsive (@5 :11PM).  Bystander CPR was initiated by her son.  EMS arrived and she was agonally breathing.  BG was >100 and narcan had no effect. She had a pulse at that time.  The family denies reports of cough, fever, chills, SOB, Chest pain.  In the ED at [facility] she suffered a cardiac arrest 4-40min of total down time with ACLS in process (rhythm was not documented).  ROSC was obtained and (per family) she was moving spontaneously.  Significant tests/ events 11/11 episode of VF arrest - torsades Mg 1.3   SUBJECTIVE: as above Sedated, intubated, critically ill On epi & dopamine -HR 60s Febrile 100.9  VITAL SIGNS: Temp:  [95.2 F (35.1 C)-100 F (37.8 C)] 100 F (37.8 C) (11/11 0615) Pulse Rate:  [32-115] 65 (11/11 0735) Resp:  [12-34] 20 (11/11 0735) BP: (73-172)/(39-89) 123/50 (11/11 0735) SpO2:  [86 %-100 %] 98 % (11/11 0735) FiO2 (%):  [50 %-100 %] 50 % (11/11 0430) Weight:  [170 lb (77.1 kg)-175 lb 14.8 oz (79.8 kg)] 175  lb 14.8 oz (79.8 kg) (11/11 0215) HEMODYNAMICS: CVP:  [7 mmHg-24 mmHg] 14 mmHg VENTILATOR SETTINGS: Vent Mode: PRVC FiO2 (%):  [50 %-100 %] 50 % Set Rate:  [16 bmp-20 bmp] 20 bmp Vt Set:  [500 mL] 500 mL PEEP:  [5 cmH20-10 cmH20] 10 cmH20 Plateau Pressure:  [20 cmH20-25 cmH20] 25 cmH20 INTAKE / OUTPUT:  Intake/Output Summary (Last 24 hours) at 07/27/16 0743 Last data filed at 07/27/16 0300  Gross per 24 hour  Intake               50 ml  Output             3650 ml  Net            -3600 ml    PHYSICAL EXAMINATION: General:  Chr ill appearing,Somnolent but sedated Neuro:  unresponsive  Did not open eyes to name HEENT:  Eccymosis right anterior fronto-temporal scalp Cardiovascular:  RRR, s1/s2 Lungs:  Mechanical breath sounds b/l no w/w/r Abdomen:  Soft, NT, Distended,  Normal bowel sounds, + tympani on percussion Musculoskeletal: normal bulk and tone.  Right BKA Skin:  No c/c/e  LABS:  CBC  Recent Labs Lab 07/22/16 1244 07/24/2016 1800 07/27/16 0530  WBC 11.0* 18.0* 14.3*  HGB 8.7* 9.8* 8.2*  HCT 28.2* 32.3* 26.3*  PLT 393 536* 357   Coag's  Recent Labs Lab 07/22/16 1244  07/31/2016 1800  INR 1.89 1.99   BMET  Recent Labs Lab 07/24/16 0508 07/25/2016 1800 07/27/16 0530  NA 135 123* 131*  K 3.8 4.0 3.5  CL 90* 81* 87*  CO2 37* 27 30  BUN 23* 34* 26*  CREATININE 1.46* 1.84* 1.53*  GLUCOSE 119* 156* 145*   Electrolytes  Recent Labs Lab 07/22/16 1244 07/23/16 0650 07/24/16 0508 07/18/2016 1800 07/27/16 0530  CALCIUM 10.0 9.5 9.4 9.1 9.1  MG 1.5* 2.0  --   --  1.3*   Sepsis Markers  Recent Labs Lab 07/25/2016 1806 07/27/16 0256  LATICACIDVEN 5.15* 1.3   ABG  Recent Labs Lab 08/10/2016 2355 07/27/16 0253  PHART 7.403 7.454*  PCO2ART 52.1* 52.6*  PO2ART 74.8* 171.0*   Liver Enzymes  Recent Labs Lab 07/22/16 1244 08/01/2016 1800 07/27/16 0530  AST 20 28 29   ALT 10* 15 16  ALKPHOS 84 81 71  BILITOT 0.8 1.0 1.1  ALBUMIN 4.1 4.2 3.3*    Cardiac Enzymes  Recent Labs Lab 07/21/2016 1800  TROPONINI <0.03   Glucose  Recent Labs Lab 07/23/16 2034 07/24/16 0918 08/15/2016 1748 07/27/2016 2324 07/27/16 0631 07/27/16 0737  GLUCAP 173* 131* 148* 206* 136* 129*    Imaging Dg Chest Portable 1 View  Result Date: 08/15/2016 CLINICAL DATA:  Patient status post intubation.  Unresponsive. EXAM: PORTABLE CHEST 1 VIEW COMPARISON:  Chest radiograph 07/23/2016. FINDINGS: ET tube terminates in the mid trachea. Enteric tube tip and side-port project over the stomach. Multiple monitoring leads overlie the patient. Pacer pads overlie the left hemi thorax. Stable enlarged cardiac and mediastinal contours. Interval increase in diffuse bilateral predominantly interstitial pulmonary opacities. Small bilateral pleural effusions. No pneumothorax. IMPRESSION: ET tube in the mid trachea. Cardiomegaly and increased bilateral interstitial pulmonary opacities most compatible with pulmonary edema. Small bilateral pleural effusions. Electronically Signed   By: Lovey Newcomer M.D.   On: 08/03/2016 19:07   Dg Chest Port 1v Same Day  Result Date: 08/14/2016 CLINICAL DATA:  Post central line placement EXAM: PORTABLE CHEST 1 VIEW COMPARISON:  07/27/2016 FINDINGS: Endotracheal tube tip is approximately 2.7 cm superior to the carina. Interim insertion of right-sided central venous catheter with tip overlying expected location of SVC. No right pneumothorax. Esophageal to tip is below the diaphragm. There is stable cardiomegaly with central vascular congestion. There is moderate interstitial and alveolar opacity, likely representing edema. This has increased. Small bilateral effusions. Surgical clips in the neck IMPRESSION: 1. Right sided central venous catheter tip overlies the SVC. No pneumothorax. 2. Cardiomegaly with central vascular congestion and worsening interstitial and alveolar airspace disease right greater than left compatible with pulmonary edema. Small  bilateral effusions. Electronically Signed   By: Donavan Foil M.D.   On: 08/05/2016 21:56    Bedside TTE: -Dilated LV - LVEF approximately 40% - RV normal size and function - mild Septal boeing  - Dilated RA with decreased motion - mod-severe TR - mild MR - Trace pericardial effusion - No focal wall motion abnormalities - IVC 1.9cm and not collapsable = RAP approx 5-8  ASSESSMENT / PLAN: 65 yo CF with unwitnessed arrest at home and cardiac arrest in the ED.  Probable that given her history of pulmonary hypertension and O2 non adherence she had profound hypoxic vasoconstriction with acute pressure overload of RV with resulting arrest.  Type I ischemic event seems less likely, and PE/VTE seems highly unlikely given her dual anticoagulation. Second episode of torsades 11/11 am with Mg 1.3  PULMONARY A:  Acute on chronic hypoxemic respiratory failure Pulmonary hypertension - not currently decompensated (WHO II and III) COPD Pulmonary contusions B/l  P:   - continue mechanical ventilation - Hold anticoagulation given fall and head injury - Duonebs Q6hours PRN - Vent management bundle  CARDIOVASCULAR  A:  Cardiogenic shock Hypovolemic shock Bradicardic arrest - no obvious wall motion abnormalites. Initial EKG was ventricular escape @43  vs junctional bradycardia. 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 (baseline Scr 1.1-1.3) 2/2 shock  P:   - Volume resucitation - Maintain MAP > 65 - UOP adequate  GASTROINTESTINAL A:   Bloody OG tube drainage  P:   - likley 2/2/ CPR  trauma on blood thinners - CBC at baseline   HEMATOLOGIC A:   Anemia of chronic disease Leukocytosis - no prodromal infectious symptoms  P:  - trend CBC  INFECTIOUS A:   No obvious infectious source  P:   BCx2 07/20/2016 UC: NA Sputum: 07/27/2016  Empiric abx for HCAP  Cefepime 11/11>> vanc 11/11>>   ENDOCRINE A:   DM   P:   SSI  NEUROLOGIC A:   Toxic/metabolic encephalopathy - was following commands after 1st arrest P:   -Minimise versed, fent gtt, goal RASS -1 - CT head today when patient more stable.   FAMILY  - Updates:  I updated husband and sister at bedside today  Total critical care time: 11 min  Kara Mead MD. FCCP. [place] [facility] Pager [phone] If no response call [phone]     07/27/2016, 7:43 AM

## 2016-07-27 NOTE — Progress Notes (Signed)
[place] Progress Note Patient Name: [name] DOB: 1951-01-01 MRN: [id number]   Date of Service  07/27/2016  HPI/Events of Note  New admission in transfer from [facility]. BP = 73/42 and HR = 49. Presently on an Epinephrine IV infusion. Asked to review CXR for CVL placement. CXR reveals CVL tip in mid SVC. ETT tip in mid trachea. CXR with diffuse bilateral infiltrates.   eICU Interventions  Will order: 1. Monitor CVP. 2. Dopamine IV infusion. Titrate to MAP > 65. 3. Ventilator settings: 80%/PRVC 16/TV 500//P 10. 4. ABG now. 5. Atropine 0.4 mg IV now.  6. Pepcid 20 mg IV now and Q 12 hours.  7. SCD's to bilateral LE. 8. Heparin 5000 units [place] Q 8 hours.       Intervention Category Major Interventions: Respiratory failure - evaluation and management  Sommer,Steven Eugene 07/27/2016, 2:24 AM

## 2016-07-27 NOTE — H&P (Signed)
PULMONARY / CRITICAL CARE MEDICINE   Name: SOLENE VASILIOU MRN: [id number] DOB: [birth year]    ADMISSION DATE:  07/25/2016  CHIEF COMPLAINT:  Bradycardic arrest/Respiratory arrest  HISTORY OF PRESENT ILLNESS:    65 y.o.with hx of pulmonary hypertension, Chronic Diastolic CHF (LVEF 99991111, PA peak pressure 68 mm Hg 04/09/16), COPD, VTE (on Plavix and xarelto) severe PAD s/p R BKA 2/17, carotid stenosis s/p B CEA, DM2, HTN, HL, and Chronic pain.  She is intubated and unable to provide history.  Her husband and family friend state that on 07/24/2016 she was admitted for an elective DC cardioversion for Afib/flutter.  She tolerated that well and had not complaints until this morning, she was out with her husband and was not wearing her home O2.  Her husband states her O2 sats were in the 50s-70s for many hours and she was even cyanotic for a period of time.  Then returned home, she put her O2 on and he left the room.  He then heard her hit the floor and she was unresponsive (@5 :11PM).  Bystander CPR was initiated by her son.  EMS arrived and she was agonally breathing.  BG was >100 and narcan had no effect. She had a pulse at that time.  The family denies reports of cough, fever, chills, SOB, Chest pain.  In the ED at [facility] she suffered a cardiac arrest 4-41min of total down time with ACLS in process (rhythm was not documented).  ROSC was obtained and (per family) she was moving spontaneously.  PAST MEDICAL HISTORY :   has a past medical history of Anemia; Anxiety; Arthritis; CHF (congestive heart failure) ([place]); Chronic lower back pain; COPD (chronic obstructive pulmonary disease) (HCC); GERD (gastroesophageal reflux disease); History of atrial fibrillation; Hyperlipidemia; Hypertension; Hypoxia; On home oxygen therapy; Peripheral vascular disease ([place]); Respiratory failure ([place]); Shingles (Dec. 2016); Sleep apnea; Stroke [facility]); and Type II diabetes mellitus ([place]).  has a past surgical history that  includes Posterior lumbar fusion; Foot surgery (Bilateral); Cardiac catheterization (N/A, 04/10/2015); Thrombectomy femoral artery (Right, 04/10/2015); Fasciotomy (Right, 04/10/2015); Femoral-popliteal Bypass Graft (Right, 04/10/2015); Intraoperative arteriogram (Right, 04/10/2015); Carotid endarterectomy (Right, 2010); Cardiac catheterization (N/A, 10/20/2015); Laparoscopic cholecystectomy; Back surgery; Carotid endarterectomy (Left, 03/2008); Shoulder open rotator cuff repair (Right, 02/2006); Cardiac catheterization; Leg amputation through knee (Right, 10/30/2015); Amputation (Right, 10/30/2015); Cardiac catheterization (N/A, 04/18/2016); Cardiac catheterization (N/A, 04/18/2016); Cardioversion (N/A, 06/14/2016); and Cardioversion (N/A, 07/24/2016). Prior to Admission medications   Medication Sig Start Date End Date Taking? Authorizing Provider  albuterol (PROVENTIL HFA;VENTOLIN HFA) 108 (90 Base) MCG/ACT inhaler Inhale 2 puffs into the lungs every 6 (six) hours as needed for wheezing or shortness of breath.    Historical Provider, MD  ALPRAZolam Duanne Moron) 0.25 MG tablet Take 1 tablet (0.25 mg total) by mouth 2 (two) times daily. 04/20/16   Orson Eva, MD  amiodarone (PACERONE) 200 MG tablet Take 1 tablet (200 mg total) by mouth 2 (two) times daily. 07/17/16   Shirley Friar, PA-C  apixaban (ELIQUIS) 5 MG TABS tablet Take 1 tablet (5 mg total) by mouth 2 (two) times daily. 06/12/16   Larey Dresser, MD  atorvastatin (LIPITOR) 20 MG tablet Take 20 mg by mouth daily at 6 PM.     Historical Provider, MD  citalopram (CELEXA) 20 MG tablet Take 20 mg by mouth daily.  07/04/13   Historical Provider, MD  clopidogrel (PLAVIX) 75 MG tablet Take 1 tablet (75 mg total) by mouth daily with breakfast. 05/10/16   Junie Panning  Christain Sacramento, NP  diltiazem (CARDIZEM CD) 240 MG 24 hr capsule Take 1 capsule (240 mg total) by mouth daily. 04/20/16   Orson Eva, MD  fluticasone furoate-vilanterol (BREO ELLIPTA) 100-25 MCG/INH AEPB Inhale 1 puff into the  lungs daily. 03/09/16   Lucia Gaskins, MD  gabapentin (NEURONTIN) 600 MG tablet Take 600 mg by mouth 3 (three) times daily.    Historical Provider, MD  insulin aspart (NOVOLOG) 100 UNIT/ML injection Inject 10 Units into the skin 3 (three) times daily as needed for high blood sugar (CBG >150).     Historical Provider, MD  Insulin Glargine (LANTUS SOLOSTAR) 100 UNIT/ML Solostar Pen Inject 15 Units into the skin daily at 10 pm.     Historical Provider, MD  ipratropium-albuterol (DUONEB) 0.5-2.5 (3) MG/3ML SOLN Take 3 mLs by nebulization every 6 (six) hours as needed (wheezing).     Historical Provider, MD  metFORMIN (GLUCOPHAGE) 500 MG tablet Take 500 mg by mouth 2 (two) times daily with a meal.    Historical Provider, MD  oxyCODONE-acetaminophen (PERCOCET) 10-325 MG tablet Take 1 tablet by mouth every 4 (four) hours as needed for pain.    Historical Provider, MD  OXYGEN Inhale 2 L into the lungs continuous.    Historical Provider, MD  pantoprazole (PROTONIX) 40 MG tablet Take 40 mg by mouth daily.     Historical Provider, MD  sitaGLIPtin (JANUVIA) 100 MG tablet Take 100 mg by mouth daily.     Historical Provider, MD  spironolactone (ALDACTONE) 25 MG tablet Take 0.5 tablets (12.5 mg total) by mouth daily. 07/24/16   Shirley Friar, PA-C  torsemide (DEMADEX) 20 MG tablet Take 2 tablets (40 mg total) by mouth daily. 06/04/16   Shirley Friar, PA-C   Allergies  Allergen Reactions  . Codeine Rash  . Penicillins Other (See Comments)    Has patient had a PCN reaction causing immediate rash, facial/tongue/throat swelling, SOB or lightheadedness with hypotension: unknown Has patient had a PCN reaction causing severe rash involving mucus membranes or skin necrosis: unknown Has patient had a PCN reaction that required hospitalization unknown Has patient had a PCN reaction occurring within the last 10 years: unknown If all of the above answers are "NO", then may proceed with Cephalosporin use.   . Ciprofloxacin Hives    FAMILY HISTORY:  indicated that her mother is deceased. She indicated that her father is deceased. She indicated that the status of her brother is unknown.   SOCIAL HISTORY:  reports that she quit smoking about 10 months ago. Her smoking use included Cigarettes. She has a 5.88 pack-year smoking history. She has never used smokeless tobacco. She reports that she does not drink alcohol or use drugs.  REVIEW OF SYSTEMS:   Unable to obtain from patient.  Pertinent positives and negatives in HPI  SUBJECTIVE:   VITAL SIGNS: Temp:  [95.2 F (35.1 C)-100 F (37.8 C)] 99.9 F (37.7 C) (11/11 0230) Pulse Rate:  [32-115] 62 (11/11 0230) Resp:  [12-34] 16 (11/11 0230) BP: (73-172)/(39-89) 128/46 (11/11 0230) SpO2:  [86 %-100 %] 97 % (11/11 0230) FiO2 (%):  [80 %-100 %] 80 % (11/11 0220) Weight:  [77.1 kg (170 lb)-79.8 kg (175 lb 14.8 oz)] 79.8 kg (175 lb 14.8 oz) (11/11 0215) HEMODYNAMICS:   VENTILATOR SETTINGS: Vent Mode: PRVC FiO2 (%):  [80 %-100 %] 80 % Set Rate:  [16 bmp] 16 bmp Vt Set:  [500 mL] 500 mL PEEP:  [5 cmH20-10 cmH20] 10 cmH20 Plateau Pressure:  [  [place] / OUTPUT:  Intake/Output Summary (Last 24 hours) at 07/27/16 0310 Last data filed at 07/18/2016 2314  Gross per 24 hour  Intake               50 ml  Output             3000 ml  Net            -2950 ml    PHYSICAL EXAMINATION: General:  Somnolent but sedated Neuro:  Squeezed hand b/l and wiggled toes on command.  Localized to pain.  Did not open eyes HEENT:  Eccymosis right anterior fronto-temporal scalp Cardiovascular:  RRR, s1/s2 Lungs:  Mechanical breath sounds b/l no w/w/r Abdomen:  Soft, NT, Distended,  Normal bowel sounds, + tympani on percussion Musculoskeletal: normal bulk and tone.  Right BKA Skin:  No c/c/e  LABS:  CBC  Recent Labs Lab 07/22/16 1244 07/23/2016 1800  WBC 11.0* 18.0*  HGB 8.7* 9.8*  HCT 28.2* 32.3*  PLT 393 536*    Coag's  Recent Labs Lab 07/22/16 1244 07/30/2016 1800  INR 1.89 1.99   BMET  Recent Labs Lab 07/23/16 0650 07/24/16 0508 07/18/2016 1800  NA 135 135 123*  K 4.1 3.8 4.0  CL 89* 90* 81*  CO2 32 37* 27  BUN 21* 23* 34*  CREATININE 1.26* 1.46* 1.84*  GLUCOSE 94 119* 156*   Electrolytes  Recent Labs Lab 07/22/16 1244 07/23/16 0650 07/24/16 0508 08/13/2016 1800  CALCIUM 10.0 9.5 9.4 9.1  MG 1.5* 2.0  --   --    Sepsis Markers  Recent Labs Lab 07/23/2016 1806  LATICACIDVEN 5.15*   ABG  Recent Labs Lab 08/14/2016 2355 07/27/16 0253  PHART 7.403 7.454*  PCO2ART 52.1* 52.6*  PO2ART 74.8* 171.0*   Liver Enzymes  Recent Labs Lab 07/22/16 1244 08/02/2016 1800  AST 20 28  ALT 10* 15  ALKPHOS 84 81  BILITOT 0.8 1.0  ALBUMIN 4.1 4.2   Cardiac Enzymes  Recent Labs Lab 07/21/2016 1800  TROPONINI <0.03   Glucose  Recent Labs Lab 07/23/16 1216 07/23/16 1616 07/23/16 2034 07/24/16 0918 08/04/2016 1748 07/27/2016 2324  GLUCAP 158* 149* 173* 131* 148* 206*    Imaging Dg Chest Portable 1 View  Result Date: 07/25/2016 CLINICAL DATA:  Patient status post intubation.  Unresponsive. EXAM: PORTABLE CHEST 1 VIEW COMPARISON:  Chest radiograph 07/23/2016. FINDINGS: ET tube terminates in the mid trachea. Enteric tube tip and side-port project over the stomach. Multiple monitoring leads overlie the patient. Pacer pads overlie the left hemi thorax. Stable enlarged cardiac and mediastinal contours. Interval increase in diffuse bilateral predominantly interstitial pulmonary opacities. Small bilateral pleural effusions. No pneumothorax. IMPRESSION: ET tube in the mid trachea. Cardiomegaly and increased bilateral interstitial pulmonary opacities most compatible with pulmonary edema. Small bilateral pleural effusions. Electronically Signed   By: Lovey Newcomer M.D.   On: 07/27/2016 19:07   Dg Chest Port 1v Same Day  Result Date: 08/09/2016 CLINICAL DATA:  Post central line  placement EXAM: PORTABLE CHEST 1 VIEW COMPARISON:  08/09/2016 FINDINGS: Endotracheal tube tip is approximately 2.7 cm superior to the carina. Interim insertion of right-sided central venous catheter with tip overlying expected location of SVC. No right pneumothorax. Esophageal to tip is below the diaphragm. There is stable cardiomegaly with central vascular congestion. There is moderate interstitial and alveolar opacity, likely representing edema. This has increased. Small bilateral effusions. Surgical clips in the neck IMPRESSION: 1. Right sided central venous  catheter tip overlies the SVC. No pneumothorax. 2. Cardiomegaly with central vascular congestion and worsening interstitial and alveolar airspace disease right greater than left compatible with pulmonary edema. Small bilateral effusions. Electronically Signed   By: Donavan Foil M.D.   On: 08/07/2016 21:56    Bedside TTE: -Dilated LV - LVEF approximately 40% - RV normal size and function - mild Septal boeing  - Dilated RA with decreased motion - mod-severe TR - mild MR - Trace pericardial effusion - No focal wall motion abnormalities - IVC 1.9cm and not collapsable = RAP approx 5-8  ASSESSMENT / PLAN: 65 yo CF with unwitnessed arrest at home and cardiac arrest in the ED.  I suspect, given her history of pulmonary hypertension and O2 non adherence she had profound hypoxic vasoconstriction with acute pressure overload of RV with resulting arrest.  Type I ischemic event seems less likely, and PE/VTE seems highly unlikely given her duel anticoagulation.  PULMONARY A: Acute on chronic hypoxemic respiratory failure Pulmonary hypertension - not currently decompensated (WHO II and III) COPD Pulmonary contusions B/l  P:   - continue mechanical ventilation - ETT in appropriate place,  - no vent changes based on ABG (Respiratory alkalosis - mild) - Hold anticoagulation given fall and head injury - Duonebs Q6hours PRN - no diuresis given  shock - Vent management bundle  CARDIOVASCULAR  A:  Cardiogenic shock Hypovolemic shock Bradicardic arrest - no obvious wall motion abnormalites. Initial EKG was ventricular escape @43  vs junctional bradycardia. Pulmonary hypertension  P:  - Dopamine now - weaning as tolerated - Volume resucitation with 1-2L LR - No acute cardiac interventions at this time - no indication for pulmonary vasodilators given RV normal size and function - Maintain MAP >65 - Trend troponin - initial trop negative but now within 4-6hr window - Trend lactate - now cleared  RENAL A:   AKI on CKD (baseline Scr 1.1-1.3) 2/2 shock  P:   - Volume resucitation - Maintain MAP > 65 - foley in place - UOP adequate  GASTROINTESTINAL A:   Bloody OG tube drainage  P:   - likley 2/2/ CPR trauma on blood thinners - CBC at baseline - trend output - nor reversal needed at this time  HEMATOLOGIC A:   Anemia of chronic disease Leukocytosis - no prodromal infectious symptoms  P:  - trend CBC  INFECTIOUS A:   No obvious infectious source  P:   BCx2 08/05/2016 UC: NA Sputum: 07/27/2016   ENDOCRINE A:   DM   P:   - Basal/bolus  NEUROLOGIC A:   Toxic/metabolic encephalopathy P:   - holding all sedation for now to further assess return of neuro status - CT head today when patient more stable.   FAMILY  - Updates:  I updated husband and sister at bedside today  Total critical care time: 45 min  Critical care time was exclusive of separately billable procedures and treating other patients.  Critical care was necessary to treat or prevent imminent or life-threatening deterioration.  Critical care was time spent personally by me on the following activities: development of treatment plan with patient and/or surrogate as well as nursing, discussions with consultants, evaluation of patient's response to treatment, examination of patient, obtaining history from patient or surrogate, ordering  and performing treatments and interventions, ordering and review of laboratory studies, ordering and review of radiographic studies, pulse oximetry and re-evaluation of patient's condition.   Meribeth Mattes, DO., MS [place] [facility]  Pulmonary and [place] Pager: [phone]  07/27/2016, 3:10 AM

## 2016-07-28 LAB — GLUCOSE, CAPILLARY: Glucose-Capillary: 131 mg/dL — ABNORMAL HIGH (ref 65–99)

## 2016-07-28 MED ORDER — SODIUM CHLORIDE 0.9 % IV SOLN
10.0000 mg/h | INTRAVENOUS | Status: DC
  Administered 2016-07-28: 4 mg/h via INTRAVENOUS

## 2016-07-28 MED ORDER — MORPHINE BOLUS VIA INFUSION
5.0000 mg | INTRAVENOUS | Status: DC | PRN
  Administered 2016-07-28: 10 mg via INTRAVENOUS
  Filled 2016-07-28: qty 20

## 2016-07-28 MED ORDER — SODIUM CHLORIDE 0.9 % IV SOLN
10.0000 mg/h | INTRAVENOUS | Status: DC
  Administered 2016-07-28 (×2): 10 mg/h via INTRAVENOUS
  Filled 2016-07-28: qty 10

## 2016-07-28 MED ORDER — MIDAZOLAM BOLUS VIA INFUSION (WITHDRAWAL LIFE SUSTAINING TX)
5.0000 mg | INTRAVENOUS | Status: DC | PRN
  Administered 2016-07-28: 2 mg via INTRAVENOUS
  Filled 2016-07-28: qty 20

## 2016-07-29 ENCOUNTER — Ambulatory Visit: Payer: Medicare Other | Admitting: Internal Medicine

## 2016-07-29 MED FILL — Medication: Qty: 1 | Status: AC

## 2016-07-30 ENCOUNTER — Ambulatory Visit (HOSPITAL_COMMUNITY): Admission: RE | Admit: 2016-07-30 | Payer: Medicare Other | Source: Ambulatory Visit | Admitting: Ophthalmology

## 2016-07-30 SURGERY — PHACOEMULSIFICATION, CATARACT, WITH IOL INSERTION
Anesthesia: Monitor Anesthesia Care | Laterality: Left

## 2016-07-31 ENCOUNTER — Telehealth: Payer: Self-pay

## 2016-07-31 LAB — CULTURE, BLOOD (ROUTINE X 2)
CULTURE: NO GROWTH
Culture: NO GROWTH

## 2016-07-31 NOTE — Telephone Encounter (Signed)
On 07/31/2016 I received a death certificate from [facility] (original). The death certificate is for burial. The patient is a patient of Doctor Elsworth Soho. The death certificate will be taken to Pulmonary Unit @ [facility] this am for signature.  On August 08, 2016 I received the death certificate back from Doctor Elsworth Soho. I got the death certificate ready and called the funeral home to let them know the death certificate was mailed to the [facility] Department per the funeral home request.

## 2016-08-05 ENCOUNTER — Encounter (HOSPITAL_COMMUNITY): Payer: Medicare Other

## 2016-08-16 NOTE — Progress Notes (Signed)
Wasted in sink: 30 ml versed, 240ml Fentanyl, 233ml Morphine from IV bags. Ezekiel Slocumb RN as witness.

## 2016-08-16 NOTE — Progress Notes (Signed)
eLink Physician-Brief Progress Note Patient Name: [name] DOB: 10/31/1950 MRN: [id number]   Date of Service  08-02-16  HPI/Events of Note  Family requested discontinuation of life supporting measures.  Chart reviewed, in my opinion it think it is appropriate to proceed with that given extent of disease.  Spoke with bedside RN, will start morphine now and extubate and turn pacer off only when family is ready    eICU Interventions  Withdrawal order set in place.     Intervention Category Major Interventions: Other:  [name] August 02, 2016, 12:02 AM

## 2016-08-16 NOTE — Progress Notes (Signed)
This note also relates to the following rows which could not be included: SpO2 - Cannot attach notes to unvalidated device data  Patient extubated per Withdrawal protocol. NO breaths taken post removal of tube while in the room. Patient looks comfortable at this time.

## 2016-08-16 NOTE — Accreditation Note (Signed)
o Restraints not reported to CMS Pursuant to regulation 482.13 (G) (3) use of soft wrist restraints was logged on 11.13.2017 at 0700 by Evette Cristal, RN, Accreditation and Patient Safety.

## 2016-08-16 NOTE — Discharge Summary (Signed)
PULMONARY / CRITICAL CARE MEDICINE   Name: [name] MRN: [id number] DOB: Apr 13, 1951    ADMISSION DATE:  07/23/2016  CHIEF COMPLAINT:  Bradycardic arrest/Respiratory arrest  HISTORY OF PRESENT ILLNESS:    65 y.o.with hx of pulmonary hypertension, Chronic Diastolic CHF (LVEF 99991111, PA peak pressure 68 mm Hg 04/09/16), COPD, VTE (on Plavix and xarelto) severe PAD s/p R BKA 2/17, carotid stenosis s/p B CEA, DM2, HTN, HL, and Chronic pain.  She is intubated and unable to provide history.  Her husband and family friend state that on 07/24/2016 she was admitted for an elective DC cardioversion for Afib/flutter.  She tolerated that well and had not complaints until this morning, she was out with her husband and was not wearing her home O2.  Her husband states her O2 sats were in the 50s-70s for many hours and she was even cyanotic for a period of time.  Then returned home, she put her O2 on and he left the room.  He then heard her hit the floor and she was unresponsive (@5 :11PM).  Bystander CPR was initiated by her son.  EMS arrived and she was agonally breathing.  BG was >100 and narcan had no effect. She had a pulse at that time.  The family denies reports of cough, fever, chills, SOB, Chest pain.  In the ED at [facility] she suffered a cardiac arrest 4-34min of total down time with ACLS in process (rhythm was not documented).  ROSC was obtained and (per family) she was moving spontaneously.  Significant tests/ events 11/11 episode of VF arrest - torsades Mg 1.3  Bedside TTE: -Dilated LV - LVEF approximately 40% - RV normal size and function - mild Septal boeing  - Dilated RA with decreased motion - mod-severe TR - mild MR - Trace pericardial effusion - No focal wall motion abnormalities - IVC 1.9cm and not collapsable = RAP approx 5-8  ASSESSMENT / PLAN: 65 yo CF with unwitnessed arrest at home and cardiac arrest in the ED.  Probable that given her history of pulmonary hypertension and  O2 non adherence she had profound hypoxic vasoconstriction with acute pressure overload of RV with resulting arrest.  Type I ischemic event seems less likely, and PE/VTE seems highly unlikely given her dual anticoagulation. Second episode of torsades 11/11 am with Mg 1.3  PULMONARY A: Acute on chronic hypoxemic respiratory failure Pulmonary hypertension - not currently decompensated (WHO II and III) COPD Pulmonary contusions B/l  P:   - continue mechanical ventilation - Hold anticoagulation given fall and head injury - Duonebs Q6hours PRN - Vent management bundle  CARDIOVASCULAR  A:  Cardiogenic shock Hypovolemic shock Bradicardic arrest - no obvious wall motion abnormalites. Initial EKG was ventricular escape @43  vs junctional bradycardia. Pulmonary hypertension Torsades 11/11 -Mg 1.3 & prolonged qTc  P:  - Dopamine for bradycardia - do not titrate - can wean if HR remains > 60 -wean epi to off - replete Mag- another 4g now -start Amio gtt, cards consult - no indication for pulmonary vasodilators given RV normal size and function - Maintain MAP >65 - Trend troponin - initial trop negative but now within 4-6hr window - Monitor qTc each shift    NEUROLOGIC A:   Toxic/metabolic encephalopathy - was following commands after 1st arrest P:   -Minimise versed, fent gtt, goal RASS -1 - CT head today when patient more stable.   COURSE-  She suffered another cardiac arrest, after discussion with the family was made DO  NOT RESUSCITATE, family requested withdrawal of life support later on, she was placed on morphine and passed away soon after ventilator was withdrawn  Cause of death- acute coronary syndrome, ventricular fibrillation, pulmonary hypertension, COPD  Baylen Buckner V. MD    08/02/2016, 2:26 PM

## 2016-08-16 NOTE — Progress Notes (Signed)
   2016/08/07 0140  Clinical Encounter Type  Visited With Patient and family together  Visit Type Patient actively dying  Referral From Nurse  Consult/Referral To Chaplain  Spiritual Encounters  Spiritual Needs Prayer;Emotional  Stress Factors  Patient Stress Factors Major life changes  Family Stress Factors Family relationships  Offered prayer of comfort and emotional support of ministry of presence.

## 2016-08-16 DEATH — deceased

## 2016-08-22 ENCOUNTER — Other Ambulatory Visit (HOSPITAL_COMMUNITY): Payer: Medicare Other

## 2016-08-27 ENCOUNTER — Ambulatory Visit (HOSPITAL_COMMUNITY): Admission: RE | Admit: 2016-08-27 | Payer: Medicare Other | Source: Ambulatory Visit | Admitting: Ophthalmology

## 2016-08-27 SURGERY — PHACOEMULSIFICATION, CATARACT, WITH IOL INSERTION
Anesthesia: Monitor Anesthesia Care | Laterality: Right

## 2016-10-24 ENCOUNTER — Encounter: Payer: Self-pay | Admitting: Cardiology

## 2017-08-29 IMAGING — CR DG CHEST 1V PORT
1 series · 1 of 1 positions shown · non-contrast
Comparison: Chest radiograph performed 11/03/2015

CLINICAL DATA: Acute onset of fever and decreased O2 saturation.
Initial encounter.

EXAM:
PORTABLE CHEST 1 VIEW

[ap]
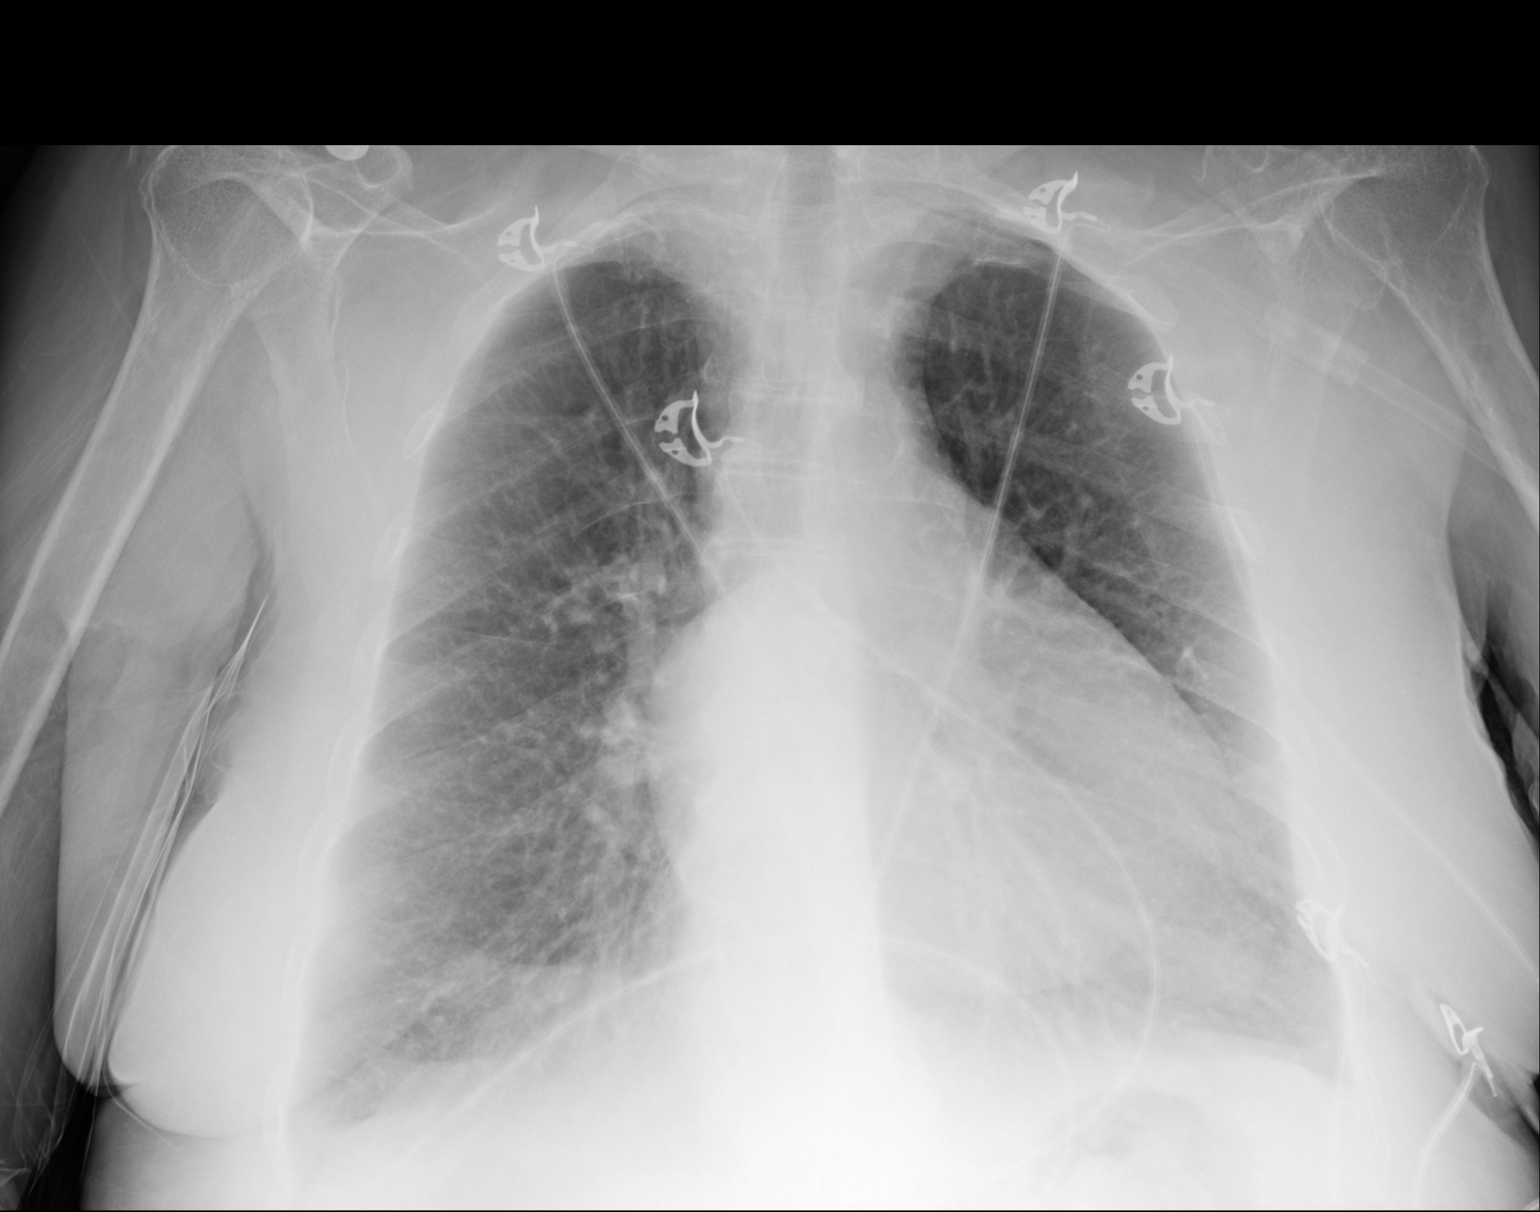

[1 of 1 positions shown; findings below may reference images not displayed]

FINDINGS: The lungs are well-aerated. Mild vascular congestion is noted. Mild
bibasilar atelectasis is seen. There is no evidence of pleural
effusion or pneumothorax.

The cardiomediastinal silhouette is borderline normal in size. No
acute osseous abnormalities are seen.
IMPRESSION: Mild vascular congestion noted.  Mild bibasilar atelectasis seen.

## 2018-02-02 IMAGING — CR DG CHEST 1V PORT
1 series · 1 of 1 positions shown · non-contrast
Comparison: 04/11/2016

CLINICAL DATA: Low-grade fever.  New onset cough.

EXAM:
PORTABLE CHEST 1 VIEW

[AP]
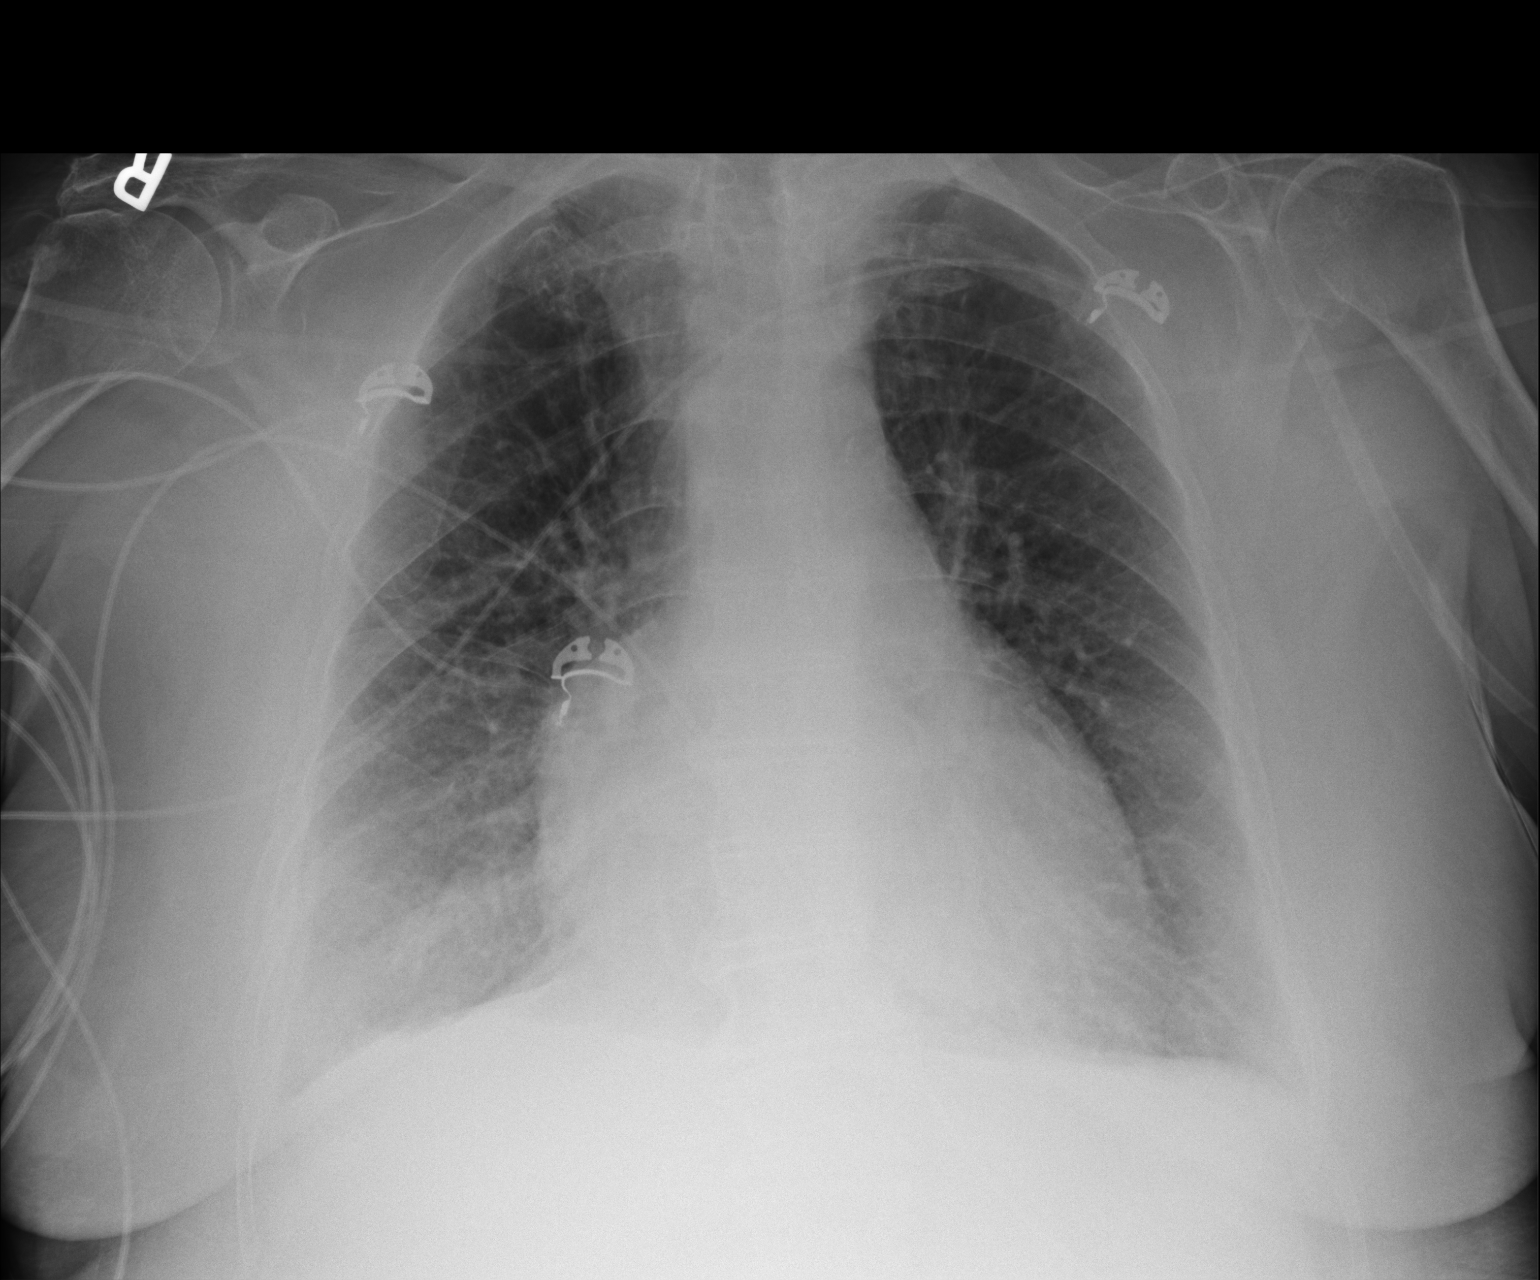

[1 of 1 positions shown; findings below may reference images not displayed]

FINDINGS: The cardiac silhouette is enlarged with stable preferential
enlargement of the right atrium. Mediastinal contours appear intact.

There is no evidence of pneumothorax. Decreased bilateral
subpulmonic pulmonary fusions. Persistent interstitial pulmonary
edema.

Osseous structures are without acute abnormality. Soft tissues are
grossly normal.
IMPRESSION: Enlarged cardiac silhouette with persistent interstitial pulmonary
edema.

Decreased in size subpulmonic pulmonary effusions.

## 2018-06-11 ENCOUNTER — Encounter (HOSPITAL_COMMUNITY): Payer: Self-pay
